# Patient Record
Sex: Male | Born: 1960
Health system: Southern US, Community
[De-identification: ages and names within clinical notes are randomized; demographics above are authoritative.]

## PROBLEM LIST (undated history)

## (undated) DIAGNOSIS — G4733 Obstructive sleep apnea (adult) (pediatric): Secondary | ICD-10-CM

## (undated) DIAGNOSIS — M545 Low back pain, unspecified: Secondary | ICD-10-CM

## (undated) DIAGNOSIS — Z7722 Contact with and (suspected) exposure to environmental tobacco smoke (acute) (chronic): Secondary | ICD-10-CM

## (undated) DIAGNOSIS — T7840XA Allergy, unspecified, initial encounter: Secondary | ICD-10-CM

## (undated) DIAGNOSIS — G629 Polyneuropathy, unspecified: Secondary | ICD-10-CM

## (undated) DIAGNOSIS — R0600 Dyspnea, unspecified: Secondary | ICD-10-CM

## (undated) DIAGNOSIS — G4730 Sleep apnea, unspecified: Secondary | ICD-10-CM

## (undated) DIAGNOSIS — M1990 Unspecified osteoarthritis, unspecified site: Secondary | ICD-10-CM

## (undated) DIAGNOSIS — I10 Essential (primary) hypertension: Secondary | ICD-10-CM

## (undated) DIAGNOSIS — G8929 Other chronic pain: Secondary | ICD-10-CM

## (undated) DIAGNOSIS — J449 Chronic obstructive pulmonary disease, unspecified: Secondary | ICD-10-CM

## (undated) DIAGNOSIS — E7800 Pure hypercholesterolemia, unspecified: Secondary | ICD-10-CM

## (undated) DIAGNOSIS — J45909 Unspecified asthma, uncomplicated: Secondary | ICD-10-CM

## (undated) DIAGNOSIS — Z9989 Dependence on other enabling machines and devices: Secondary | ICD-10-CM

## (undated) DIAGNOSIS — E119 Type 2 diabetes mellitus without complications: Secondary | ICD-10-CM

## (undated) HISTORY — DX: Allergy, unspecified, initial encounter: T78.40XA

## (undated) HISTORY — PX: LUMBAR DISC SURGERY: SHX700

## (undated) HISTORY — PX: NASAL SINUS SURGERY: SHX719

## (undated) HISTORY — PX: ANTERIOR CERVICAL DECOMP/DISCECTOMY FUSION: SHX1161

## (undated) HISTORY — DX: Sleep apnea, unspecified: G47.30

## (undated) HISTORY — PX: APPENDECTOMY: SHX54

## (undated) HISTORY — PX: BACK SURGERY: SHX140

## (undated) HISTORY — PX: INGUINAL HERNIA REPAIR: SUR1180

## (undated) HISTORY — PX: COLONOSCOPY W/ POLYPECTOMY: SHX1380

---

## 2014-05-18 DIAGNOSIS — M25512 Pain in left shoulder: Secondary | ICD-10-CM | POA: Diagnosis not present

## 2014-05-30 DIAGNOSIS — J449 Chronic obstructive pulmonary disease, unspecified: Secondary | ICD-10-CM | POA: Diagnosis not present

## 2014-05-30 DIAGNOSIS — J45909 Unspecified asthma, uncomplicated: Secondary | ICD-10-CM | POA: Diagnosis not present

## 2014-05-30 DIAGNOSIS — I10 Essential (primary) hypertension: Secondary | ICD-10-CM | POA: Diagnosis not present

## 2014-06-27 DIAGNOSIS — J45909 Unspecified asthma, uncomplicated: Secondary | ICD-10-CM | POA: Diagnosis not present

## 2014-06-28 DIAGNOSIS — J449 Chronic obstructive pulmonary disease, unspecified: Secondary | ICD-10-CM | POA: Diagnosis not present

## 2014-06-28 DIAGNOSIS — I10 Essential (primary) hypertension: Secondary | ICD-10-CM | POA: Diagnosis not present

## 2014-06-28 DIAGNOSIS — N503 Cyst of epididymis: Secondary | ICD-10-CM | POA: Diagnosis not present

## 2014-07-14 DIAGNOSIS — Z1211 Encounter for screening for malignant neoplasm of colon: Secondary | ICD-10-CM | POA: Diagnosis not present

## 2014-07-18 DIAGNOSIS — N419 Inflammatory disease of prostate, unspecified: Secondary | ICD-10-CM | POA: Diagnosis not present

## 2014-07-18 DIAGNOSIS — N503 Cyst of epididymis: Secondary | ICD-10-CM | POA: Diagnosis not present

## 2014-07-18 DIAGNOSIS — N410 Acute prostatitis: Secondary | ICD-10-CM | POA: Diagnosis not present

## 2014-07-27 DIAGNOSIS — M545 Low back pain: Secondary | ICD-10-CM | POA: Diagnosis not present

## 2014-07-27 DIAGNOSIS — I10 Essential (primary) hypertension: Secondary | ICD-10-CM | POA: Diagnosis not present

## 2014-08-01 DIAGNOSIS — J45909 Unspecified asthma, uncomplicated: Secondary | ICD-10-CM | POA: Diagnosis not present

## 2014-08-02 DIAGNOSIS — Z1211 Encounter for screening for malignant neoplasm of colon: Secondary | ICD-10-CM | POA: Diagnosis not present

## 2014-08-07 DIAGNOSIS — N481 Balanitis: Secondary | ICD-10-CM | POA: Diagnosis not present

## 2014-08-07 DIAGNOSIS — B3749 Other urogenital candidiasis: Secondary | ICD-10-CM | POA: Diagnosis not present

## 2014-08-15 DIAGNOSIS — R21 Rash and other nonspecific skin eruption: Secondary | ICD-10-CM | POA: Diagnosis not present

## 2014-08-15 DIAGNOSIS — Z4889 Encounter for other specified surgical aftercare: Secondary | ICD-10-CM | POA: Diagnosis not present

## 2014-08-17 DIAGNOSIS — M25512 Pain in left shoulder: Secondary | ICD-10-CM | POA: Diagnosis not present

## 2014-08-17 DIAGNOSIS — Z79891 Long term (current) use of opiate analgesic: Secondary | ICD-10-CM | POA: Diagnosis not present

## 2014-08-17 DIAGNOSIS — M542 Cervicalgia: Secondary | ICD-10-CM | POA: Diagnosis not present

## 2014-08-17 DIAGNOSIS — G894 Chronic pain syndrome: Secondary | ICD-10-CM | POA: Diagnosis not present

## 2014-08-18 DIAGNOSIS — R21 Rash and other nonspecific skin eruption: Secondary | ICD-10-CM | POA: Diagnosis not present

## 2014-08-18 DIAGNOSIS — B3749 Other urogenital candidiasis: Secondary | ICD-10-CM | POA: Diagnosis not present

## 2014-08-18 DIAGNOSIS — N485 Ulcer of penis: Secondary | ICD-10-CM | POA: Diagnosis not present

## 2014-08-21 DIAGNOSIS — M25551 Pain in right hip: Secondary | ICD-10-CM | POA: Diagnosis not present

## 2014-08-21 DIAGNOSIS — Z79899 Other long term (current) drug therapy: Secondary | ICD-10-CM | POA: Diagnosis not present

## 2014-08-21 DIAGNOSIS — I10 Essential (primary) hypertension: Secondary | ICD-10-CM | POA: Diagnosis not present

## 2014-08-24 DIAGNOSIS — N481 Balanitis: Secondary | ICD-10-CM | POA: Diagnosis not present

## 2014-08-24 DIAGNOSIS — M7070 Other bursitis of hip, unspecified hip: Secondary | ICD-10-CM | POA: Diagnosis not present

## 2014-09-18 DIAGNOSIS — B3749 Other urogenital candidiasis: Secondary | ICD-10-CM | POA: Diagnosis not present

## 2014-09-18 DIAGNOSIS — N481 Balanitis: Secondary | ICD-10-CM | POA: Diagnosis not present

## 2014-09-22 DIAGNOSIS — M545 Low back pain: Secondary | ICD-10-CM | POA: Diagnosis not present

## 2014-09-22 DIAGNOSIS — I10 Essential (primary) hypertension: Secondary | ICD-10-CM | POA: Diagnosis not present

## 2014-09-22 DIAGNOSIS — M7070 Other bursitis of hip, unspecified hip: Secondary | ICD-10-CM | POA: Diagnosis not present

## 2014-10-23 DIAGNOSIS — M7070 Other bursitis of hip, unspecified hip: Secondary | ICD-10-CM | POA: Diagnosis not present

## 2014-10-23 DIAGNOSIS — I10 Essential (primary) hypertension: Secondary | ICD-10-CM | POA: Diagnosis not present

## 2014-10-23 DIAGNOSIS — J449 Chronic obstructive pulmonary disease, unspecified: Secondary | ICD-10-CM | POA: Diagnosis not present

## 2014-10-26 DIAGNOSIS — N401 Enlarged prostate with lower urinary tract symptoms: Secondary | ICD-10-CM | POA: Diagnosis not present

## 2014-11-02 DIAGNOSIS — M76891 Other specified enthesopathies of right lower limb, excluding foot: Secondary | ICD-10-CM | POA: Diagnosis not present

## 2014-11-02 DIAGNOSIS — M25551 Pain in right hip: Secondary | ICD-10-CM | POA: Diagnosis not present

## 2014-11-06 DIAGNOSIS — J45909 Unspecified asthma, uncomplicated: Secondary | ICD-10-CM | POA: Diagnosis not present

## 2014-11-06 DIAGNOSIS — J45901 Unspecified asthma with (acute) exacerbation: Secondary | ICD-10-CM | POA: Diagnosis not present

## 2014-11-06 DIAGNOSIS — R0602 Shortness of breath: Secondary | ICD-10-CM | POA: Diagnosis not present

## 2014-11-06 DIAGNOSIS — R05 Cough: Secondary | ICD-10-CM | POA: Diagnosis not present

## 2014-11-06 DIAGNOSIS — R072 Precordial pain: Secondary | ICD-10-CM | POA: Diagnosis not present

## 2014-11-06 DIAGNOSIS — R0789 Other chest pain: Secondary | ICD-10-CM | POA: Diagnosis not present

## 2014-11-08 DIAGNOSIS — J45909 Unspecified asthma, uncomplicated: Secondary | ICD-10-CM | POA: Diagnosis not present

## 2014-11-08 DIAGNOSIS — J42 Unspecified chronic bronchitis: Secondary | ICD-10-CM | POA: Diagnosis not present

## 2014-11-21 DIAGNOSIS — J45901 Unspecified asthma with (acute) exacerbation: Secondary | ICD-10-CM | POA: Diagnosis not present

## 2014-11-21 DIAGNOSIS — I10 Essential (primary) hypertension: Secondary | ICD-10-CM | POA: Diagnosis not present

## 2014-12-21 DIAGNOSIS — M545 Low back pain: Secondary | ICD-10-CM | POA: Diagnosis not present

## 2015-01-02 DIAGNOSIS — H259 Unspecified age-related cataract: Secondary | ICD-10-CM | POA: Diagnosis not present

## 2015-01-02 DIAGNOSIS — H35033 Hypertensive retinopathy, bilateral: Secondary | ICD-10-CM | POA: Diagnosis not present

## 2015-01-22 DIAGNOSIS — M25569 Pain in unspecified knee: Secondary | ICD-10-CM | POA: Diagnosis not present

## 2015-02-09 DIAGNOSIS — Z23 Encounter for immunization: Secondary | ICD-10-CM | POA: Diagnosis not present

## 2015-02-20 DIAGNOSIS — M25569 Pain in unspecified knee: Secondary | ICD-10-CM | POA: Diagnosis not present

## 2015-02-20 DIAGNOSIS — M1990 Unspecified osteoarthritis, unspecified site: Secondary | ICD-10-CM | POA: Diagnosis not present

## 2015-03-21 DIAGNOSIS — M1990 Unspecified osteoarthritis, unspecified site: Secondary | ICD-10-CM | POA: Diagnosis not present

## 2015-03-21 DIAGNOSIS — M7070 Other bursitis of hip, unspecified hip: Secondary | ICD-10-CM | POA: Diagnosis not present

## 2015-03-21 DIAGNOSIS — M25569 Pain in unspecified knee: Secondary | ICD-10-CM | POA: Diagnosis not present

## 2015-04-18 DIAGNOSIS — J45901 Unspecified asthma with (acute) exacerbation: Secondary | ICD-10-CM | POA: Diagnosis not present

## 2015-04-18 DIAGNOSIS — M545 Low back pain: Secondary | ICD-10-CM | POA: Diagnosis not present

## 2015-04-18 DIAGNOSIS — M1990 Unspecified osteoarthritis, unspecified site: Secondary | ICD-10-CM | POA: Diagnosis not present

## 2015-04-18 DIAGNOSIS — M25569 Pain in unspecified knee: Secondary | ICD-10-CM | POA: Diagnosis not present

## 2015-05-15 DIAGNOSIS — J45909 Unspecified asthma, uncomplicated: Secondary | ICD-10-CM | POA: Diagnosis not present

## 2015-05-15 DIAGNOSIS — Z7951 Long term (current) use of inhaled steroids: Secondary | ICD-10-CM | POA: Diagnosis not present

## 2015-05-15 DIAGNOSIS — Z79891 Long term (current) use of opiate analgesic: Secondary | ICD-10-CM | POA: Diagnosis not present

## 2015-05-15 DIAGNOSIS — J45901 Unspecified asthma with (acute) exacerbation: Secondary | ICD-10-CM | POA: Diagnosis not present

## 2015-05-15 DIAGNOSIS — Z79899 Other long term (current) drug therapy: Secondary | ICD-10-CM | POA: Diagnosis not present

## 2015-05-15 DIAGNOSIS — I10 Essential (primary) hypertension: Secondary | ICD-10-CM | POA: Diagnosis not present

## 2015-05-15 DIAGNOSIS — E785 Hyperlipidemia, unspecified: Secondary | ICD-10-CM | POA: Diagnosis not present

## 2015-05-15 DIAGNOSIS — Z9049 Acquired absence of other specified parts of digestive tract: Secondary | ICD-10-CM | POA: Diagnosis not present

## 2015-05-15 DIAGNOSIS — Z9889 Other specified postprocedural states: Secondary | ICD-10-CM | POA: Diagnosis not present

## 2015-05-15 DIAGNOSIS — R0602 Shortness of breath: Secondary | ICD-10-CM | POA: Diagnosis not present

## 2015-05-15 DIAGNOSIS — Z7982 Long term (current) use of aspirin: Secondary | ICD-10-CM | POA: Diagnosis not present

## 2015-05-15 DIAGNOSIS — R0789 Other chest pain: Secondary | ICD-10-CM | POA: Diagnosis not present

## 2015-05-16 DIAGNOSIS — Z79891 Long term (current) use of opiate analgesic: Secondary | ICD-10-CM | POA: Diagnosis not present

## 2015-05-16 DIAGNOSIS — J45901 Unspecified asthma with (acute) exacerbation: Secondary | ICD-10-CM | POA: Diagnosis not present

## 2015-05-16 DIAGNOSIS — M1990 Unspecified osteoarthritis, unspecified site: Secondary | ICD-10-CM | POA: Diagnosis not present

## 2015-05-16 DIAGNOSIS — M7070 Other bursitis of hip, unspecified hip: Secondary | ICD-10-CM | POA: Diagnosis not present

## 2015-05-16 DIAGNOSIS — M545 Low back pain: Secondary | ICD-10-CM | POA: Diagnosis not present

## 2015-09-15 ENCOUNTER — Emergency Department (HOSPITAL_COMMUNITY): Payer: Medicare Other

## 2015-09-15 ENCOUNTER — Encounter (HOSPITAL_COMMUNITY): Payer: Self-pay

## 2015-09-15 ENCOUNTER — Emergency Department (HOSPITAL_COMMUNITY)
Admission: EM | Admit: 2015-09-15 | Discharge: 2015-09-15 | Disposition: A | Discharge status: Home / Self Care | Payer: Medicare Other | Attending: Emergency Medicine | Admitting: Emergency Medicine

## 2015-09-15 DIAGNOSIS — I10 Essential (primary) hypertension: Secondary | ICD-10-CM | POA: Diagnosis not present

## 2015-09-15 DIAGNOSIS — J45901 Unspecified asthma with (acute) exacerbation: Secondary | ICD-10-CM | POA: Diagnosis not present

## 2015-09-15 DIAGNOSIS — R0602 Shortness of breath: Secondary | ICD-10-CM | POA: Diagnosis not present

## 2015-09-15 DIAGNOSIS — Z79899 Other long term (current) drug therapy: Secondary | ICD-10-CM | POA: Insufficient documentation

## 2015-09-15 DIAGNOSIS — J441 Chronic obstructive pulmonary disease with (acute) exacerbation: Secondary | ICD-10-CM | POA: Diagnosis not present

## 2015-09-15 DIAGNOSIS — R062 Wheezing: Secondary | ICD-10-CM | POA: Diagnosis present

## 2015-09-15 HISTORY — DX: Chronic obstructive pulmonary disease, unspecified: J44.9

## 2015-09-15 HISTORY — DX: Essential (primary) hypertension: I10

## 2015-09-15 HISTORY — DX: Unspecified asthma, uncomplicated: J45.909

## 2015-09-15 LAB — BASIC METABOLIC PANEL
Anion gap: 10 (ref 5–15)
BUN: 9 mg/dL (ref 6–20)
CALCIUM: 9.3 mg/dL (ref 8.9–10.3)
CO2: 27 mmol/L (ref 22–32)
Chloride: 104 mmol/L (ref 101–111)
Creatinine, Ser: 1.28 mg/dL — ABNORMAL HIGH (ref 0.61–1.24)
GFR calc Af Amer: >60 mL/min (ref >60)
GLUCOSE: 116 mg/dL — AB (ref 65–99)
POTASSIUM: 3.6 mmol/L (ref 3.5–5.1)
SODIUM: 141 mmol/L (ref 135–145)

## 2015-09-15 LAB — CBC WITH DIFFERENTIAL/PLATELET
BASOS ABS: 0 10*3/uL (ref 0.0–0.1)
BASOS PCT: 1 %
EOS ABS: 0.3 10*3/uL (ref 0.0–0.7)
EOS PCT: 10 %
HCT: 41.9 % (ref 39.0–52.0)
Hemoglobin: 14.2 g/dL (ref 13.0–17.0)
LYMPHS PCT: 41 %
Lymphs Abs: 1.1 10*3/uL (ref 0.7–4.0)
MCH: 29 pg (ref 26.0–34.0)
MCHC: 33.9 g/dL (ref 30.0–36.0)
MCV: 85.5 fL (ref 78.0–100.0)
MONO ABS: 0.2 10*3/uL (ref 0.1–1.0)
Monocytes Relative: 8 %
Neutro Abs: 1.1 10*3/uL — ABNORMAL LOW (ref 1.7–7.7)
Neutrophils Relative %: 40 %
PLATELETS: 211 10*3/uL (ref 150–400)
RBC: 4.9 MIL/uL (ref 4.22–5.81)
RDW: 13.8 % (ref 11.5–15.5)
WBC: 2.8 10*3/uL — AB (ref 4.0–10.5)

## 2015-09-15 MED ORDER — DEXAMETHASONE SODIUM PHOSPHATE 10 MG/ML IJ SOLN
10.0000 mg | Freq: Once | INTRAMUSCULAR | Status: AC
  Administered 2015-09-15: 10 mg via INTRAMUSCULAR
  Filled 2015-09-15: qty 1

## 2015-09-15 MED ORDER — ALBUTEROL SULFATE (2.5 MG/3ML) 0.083% IN NEBU
INHALATION_SOLUTION | RESPIRATORY_TRACT | Status: AC
  Administered 2015-09-15: 10:00:00 via RESPIRATORY_TRACT
  Filled 2015-09-15: qty 6

## 2015-09-15 MED ORDER — ALBUTEROL SULFATE HFA 108 (90 BASE) MCG/ACT IN AERS: 1.0000 | INHALATION_SPRAY | Freq: Four times a day (QID) | RESPIRATORY_TRACT | Status: DC | PRN

## 2015-09-15 MED ORDER — ALBUTEROL SULFATE (2.5 MG/3ML) 0.083% IN NEBU
5.0000 mg | INHALATION_SOLUTION | Freq: Once | RESPIRATORY_TRACT | Status: AC
  Administered 2015-09-15: 5 mg via RESPIRATORY_TRACT

## 2015-09-15 MED ORDER — BUDESONIDE-FORMOTEROL FUMARATE 80-4.5 MCG/ACT IN AERO: 2.0000 | INHALATION_SPRAY | Freq: Two times a day (BID) | RESPIRATORY_TRACT | Status: DC

## 2015-09-15 MED ORDER — IPRATROPIUM-ALBUTEROL 0.5-2.5 (3) MG/3ML IN SOLN
3.0000 mL | RESPIRATORY_TRACT | Status: DC
  Administered 2015-09-15: 3 mL via RESPIRATORY_TRACT
  Filled 2015-09-15: qty 3

## 2015-09-15 MED ORDER — PREDNISONE 20 MG PO TABS: 40.0000 mg | ORAL_TABLET | Freq: Every day | ORAL | Status: DC

## 2015-09-15 NOTE — Discharge Instructions (Signed)
Is important for you to take your medications as prescribed. Please follow-up with your doctor or the [facility] in order to establish care. Return to ED for any new or worsening symptoms as we discussed.  Asthma, Acute Bronchospasm Acute bronchospasm caused by asthma is also referred to as an asthma attack. Bronchospasm means your air passages become narrowed. The narrowing is caused by inflammation and tightening of the muscles in the air tubes (bronchi) in your lungs. This can make it hard to breathe or cause you to wheeze and cough. CAUSES Possible triggers are:  Animal dander from the skin, hair, or feathers of animals.  Dust mites contained in house dust.  Cockroaches.  Pollen from trees or grass.  Mold.  Cigarette or tobacco smoke.  Air pollutants such as dust, household cleaners, hair sprays, aerosol sprays, paint fumes, strong chemicals, or strong odors.  Cold air or weather changes. Cold air may trigger inflammation. Winds increase molds and pollens in the air.  Strong emotions such as crying or laughing hard.  Stress.  Certain medicines such as aspirin or beta-blockers.  Sulfites in foods and drinks, such as dried fruits and wine.  Infections or inflammatory conditions, such as a flu, cold, or inflammation of the nasal membranes (rhinitis).  Gastroesophageal reflux disease (GERD). GERD is a condition where stomach acid backs up into your esophagus.  Exercise or strenuous activity. SIGNS AND SYMPTOMS   Wheezing.  Excessive coughing, particularly at night.  Chest tightness.  Shortness of breath. DIAGNOSIS  Your health care provider will ask you about your medical history and perform a physical exam. A chest X-ray or blood testing may be performed to look for other causes of your symptoms or other conditions that may have triggered your asthma attack. TREATMENT  Treatment is aimed at reducing inflammation and opening up the airways in  your lungs. Most asthma attacks are treated with inhaled medicines. These include quick relief or rescue medicines (such as bronchodilators) and controller medicines (such as inhaled corticosteroids). These medicines are sometimes given through an inhaler or a nebulizer. Systemic steroid medicine taken by mouth or given through an IV tube also can be used to reduce the inflammation when an attack is moderate or severe. Antibiotic medicines are only used if a bacterial infection is present.  HOME CARE INSTRUCTIONS   Rest.  Drink plenty of liquids. This helps the mucus to remain thin and be easily coughed up. Only use caffeine in moderation and do not use alcohol until you have recovered from your illness.  Do not smoke. Avoid being exposed to secondhand smoke.  You play a critical role in keeping yourself in good health. Avoid exposure to things that cause you to wheeze or to have breathing problems.  Keep your medicines up-to-date and available. Carefully follow your health care provider's treatment plan.  Take your medicine exactly as prescribed.  When pollen or pollution is bad, keep windows closed and use an air conditioner or go to places with air conditioning.  Asthma requires careful medical care. See your health care provider for a follow-up as advised. If you are more than [redacted] weeks pregnant and you were prescribed any new medicines, let your obstetrician know about the visit and how you are doing. Follow up with your health care provider as directed.  After you have recovered from your asthma attack, make an appointment with your outpatient doctor to talk about ways to reduce the likelihood of future attacks. If you do not  have a doctor who manages your asthma, make an appointment with a primary care doctor to discuss your asthma. SEEK IMMEDIATE MEDICAL CARE IF:   You are getting worse.  You have trouble breathing. If severe, call your local emergency services (911 in the  [place]  You develop chest pain or discomfort.  You are vomiting.  You are not able to keep fluids down.  You are coughing up yellow, green, brown, or bloody sputum.  You have a fever and your symptoms suddenly get worse.  You have trouble swallowing. MAKE SURE YOU:   Understand these instructions.  Will watch your condition.  Will get help right away if you are not doing well or get worse.   This information is not intended to replace advice given to you by your health care provider. Make sure you discuss any questions you have with your health care provider.   Document Released: 07/30/2006 Document Revised: 04/19/2013 Document Reviewed: 10/20/2012 Elsevier Interactive Patient Education [employer].

## 2015-09-15 NOTE — ED Notes (Signed)
Patient d/c'd from monitor, continuous pulse oximetry and blood pressure cuff; patient getting dressed to be discharged home 

## 2015-09-15 NOTE — ED Notes (Signed)
Patient here with complaint of wheezing and congestion, using inhaler with minimal relief. No acute distress

## 2015-09-15 NOTE — ED Provider Notes (Signed)
CSN: [id number]     Arrival date & time 09/15/15  0706 History   First MD Initiated Contact with Patient 09/15/15 0750     Chief Complaint  Patient presents with  . asthma flare-up      (Consider location/radiation/quality/duration/timing/severity/associated sxs/prior Treatment) HPI Christian [name] is a 55 y.o. male with a history of asthma, COPD, hypertension, here for evaluation of asthma exacerbation. Patient reports he has had gradually worsening wheezing, chest congestion over the past one month. Reports has been using his inhaler without relief. States he typically takes oral prednisoneAnd Symbicort, but ran out of this medication approximately one month ago. Moved here 5 months ago and has not found a primary care yet. Reports associated white/yellow productive cough. No other fevers, chills, chest pain or shortness of breath, leg swelling.  Past Medical History  Diagnosis Date  . Asthma   . Hypertension   . COPD (chronic obstructive pulmonary disease) [facility])    Past Surgical History  Procedure Laterality Date  . Back surgery     No family history on file. Social History  Substance Use Topics  . Smoking status: Never Smoker   . Smokeless tobacco: None  . Alcohol Use: None    Review of Systems A 10 point review of systems was completed and was negative except for pertinent positives and negatives as mentioned in the history of present illness     Allergies  Phenergan  Home Medications   Prior to Admission medications   Medication Sig Start Date End Date Taking? Authorizing Provider  albuterol (PROVENTIL HFA;VENTOLIN HFA) 108 (90 Base) MCG/ACT inhaler Inhale 1-2 puffs into the lungs every 6 (six) hours as needed for wheezing or shortness of breath. 09/15/15   Comer Locket, PA-C  budesonide-formoterol (SYMBICORT) 80-4.5 MCG/ACT inhaler Inhale 2 puffs into the lungs 2 (two) times daily. 09/15/15   Comer Locket, PA-C  predniSONE (DELTASONE) 20 MG tablet Take 2 tablets  (40 mg total) by mouth daily. 09/15/15   Decklin Weddington, PA-C   BP 150/98 mmHg  Pulse 71  Temp(Src) 98.3 F (36.8 C) (Oral)  Resp 18  SpO2 94% Physical Exam  Constitutional: He is oriented to person, place, and time. He appears well-developed and well-nourished.  HENT:  Head: Normocephalic and atraumatic.  Mouth/Throat: Oropharynx is clear and moist.  Eyes: Conjunctivae are normal. Pupils are equal, round, and reactive to light. Right eye exhibits no discharge. Left eye exhibits no discharge. No scleral icterus.  Neck: Neck supple.  Cardiovascular: Normal rate, regular rhythm and normal heart sounds.   Pulmonary/Chest: Effort normal and breath sounds normal. No respiratory distress. He has no rales.  Very mild expiratory wheezing. No other adventitious lung sounds. Oxygen saturations 98% on room air.  Abdominal: Soft. There is no tenderness.  Musculoskeletal: He exhibits no tenderness.  Neurological: He is alert and oriented to person, place, and time.  Cranial Nerves II-XII grossly intact  Skin: Skin is warm and dry. No rash noted.  Psychiatric: He has a normal mood and affect.  Nursing note and vitals reviewed.   ED Course  Procedures (including critical care time) Labs Review Labs Reviewed  BASIC METABOLIC PANEL - Abnormal; Notable for the following:    Glucose, Bld 116 (*)    Creatinine, Ser 1.28 (*)    All other components within normal limits  CBC WITH DIFFERENTIAL/PLATELET - Abnormal; Notable for the following:    WBC 2.8 (*)    Neutro Abs 1.1 (*)    All other components within  normal limits    Imaging Review Dg Chest 2 View  09/15/2015  CLINICAL DATA:  Pt states he has a hx of asthma, COPD, HTN. For the past month he has become increasingly SOB and wheezing. Pt states his medication is not helping him breathe like it used to. EXAM: CHEST  2 VIEW COMPARISON:  None. FINDINGS: The heart size and mediastinal contours are within normal limits. Both lungs are clear. No  pleural effusion or pneumothorax. The visualized skeletal structures are unremarkable. IMPRESSION: No active cardiopulmonary disease. Electronically Signed   By: Lajean Manes M.D.   On: 09/15/2015 09:27   I have personally reviewed and evaluated these images and lab results as part of my medical decision-making.   EKG Interpretation   Date/Time:  Saturday Sep 15 2015 09:01:07 EDT Ventricular Rate:  73 PR Interval:  207 QRS Duration: 83 QT Interval:  406 QTC Calculation: 447 R Axis:   62 Text Interpretation:  Sinus rhythm T wave abnormality Abnormal ekg  Reconfirmed by Carmin Muskrat  MD [phone]) on 09/15/2015 9:13:59 AM     Meds given in ED:  Medications  ipratropium-albuterol (DUONEB) 0.5-2.5 (3) MG/3ML nebulizer solution 3 mL (3 mLs Nebulization Given 09/15/15 0824)  albuterol (PROVENTIL) (2.5 MG/3ML) 0.083% nebulizer solution 5 mg (5 mg Nebulization Given 09/15/15 0718)  albuterol (PROVENTIL) (2.5 MG/3ML) 0.083% nebulizer solution ( Nebulization Given by Other 09/15/15 0943)  dexamethasone (DECADRON) injection 10 mg (10 mg Intramuscular Given 09/15/15 [id number])    New Prescriptions   ALBUTEROL (PROVENTIL HFA;VENTOLIN HFA) 108 (90 BASE) MCG/ACT INHALER    Inhale 1-2 puffs into the lungs every 6 (six) hours as needed for wheezing or shortness of breath.   BUDESONIDE-FORMOTEROL (SYMBICORT) 80-4.5 MCG/ACT INHALER    Inhale 2 puffs into the lungs 2 (two) times daily.   PREDNISONE (DELTASONE) 20 MG TABLET    Take 2 tablets (40 mg total) by mouth daily.   Filed Vitals:   09/15/15 0830 09/15/15 0900 09/15/15 1000 09/15/15 1026  BP: 156/96 165/99 150/98 150/98  Pulse: 77 71 76 71  Temp:      TempSrc:      Resp: 20  15 18   SpO2: 100% 95% 92% 94%    MDM  Christian [name] is a 55 y.o. male with a reported history of asthma here for evaluation of acute asthma exacerbation. Denies any chest pain or overt shortness of breath. Afebrile, hemodynamically stable, 100% on room air. EKG is not concerning.  Cardio pulmonary exam is reassuring. Chest x-ray unremarkable. Patient given 10 mg Decadron and emergency department, states feels much better after one breathing treatment. We'll discharge with prescription for albuterol inhaler and prednisone. Refill prescription for Symbicort. [facility] to [facility]CP care. Discussed strict return precautions. Overall, appears well, nontoxic, hematemesis stable and appropriate for discharge. Final diagnoses:  Asthma exacerbation        Comer Locket, PA-C 09/15/15 [place], MD 09/16/15 1045

## 2016-01-14 ENCOUNTER — Ambulatory Visit: Payer: Medicare Other | Attending: Internal Medicine | Admitting: Internal Medicine

## 2016-01-14 ENCOUNTER — Encounter: Payer: Self-pay | Admitting: Internal Medicine

## 2016-01-14 VITALS — BP 155/89 | HR 77 | Temp 98.7°F | Resp 16 | Wt 183.2 lb

## 2016-01-14 DIAGNOSIS — M25512 Pain in left shoulder: Secondary | ICD-10-CM | POA: Diagnosis not present

## 2016-01-14 DIAGNOSIS — R748 Abnormal levels of other serum enzymes: Secondary | ICD-10-CM

## 2016-01-14 DIAGNOSIS — J441 Chronic obstructive pulmonary disease with (acute) exacerbation: Secondary | ICD-10-CM | POA: Diagnosis not present

## 2016-01-14 DIAGNOSIS — I10 Essential (primary) hypertension: Secondary | ICD-10-CM | POA: Insufficient documentation

## 2016-01-14 DIAGNOSIS — G894 Chronic pain syndrome: Secondary | ICD-10-CM

## 2016-01-14 DIAGNOSIS — R7989 Other specified abnormal findings of blood chemistry: Secondary | ICD-10-CM

## 2016-01-14 DIAGNOSIS — Z7952 Long term (current) use of systemic steroids: Secondary | ICD-10-CM | POA: Diagnosis not present

## 2016-01-14 DIAGNOSIS — J449 Chronic obstructive pulmonary disease, unspecified: Secondary | ICD-10-CM | POA: Diagnosis not present

## 2016-01-14 DIAGNOSIS — M4802 Spinal stenosis, cervical region: Secondary | ICD-10-CM | POA: Insufficient documentation

## 2016-01-14 DIAGNOSIS — IMO0002 Reserved for concepts with insufficient information to code with codable children: Secondary | ICD-10-CM

## 2016-01-14 DIAGNOSIS — Z9889 Other specified postprocedural states: Secondary | ICD-10-CM | POA: Diagnosis not present

## 2016-01-14 DIAGNOSIS — R739 Hyperglycemia, unspecified: Secondary | ICD-10-CM

## 2016-01-14 DIAGNOSIS — Z131 Encounter for screening for diabetes mellitus: Secondary | ICD-10-CM | POA: Diagnosis not present

## 2016-01-14 DIAGNOSIS — M25562 Pain in left knee: Secondary | ICD-10-CM | POA: Diagnosis not present

## 2016-01-14 DIAGNOSIS — M25511 Pain in right shoulder: Secondary | ICD-10-CM | POA: Diagnosis not present

## 2016-01-14 DIAGNOSIS — Z888 Allergy status to other drugs, medicaments and biological substances status: Secondary | ICD-10-CM | POA: Insufficient documentation

## 2016-01-14 DIAGNOSIS — H6123 Impacted cerumen, bilateral: Secondary | ICD-10-CM

## 2016-01-14 LAB — POCT GLYCOSYLATED HEMOGLOBIN (HGB A1C): Hemoglobin A1C: 6.5

## 2016-01-14 MED ORDER — BUDESONIDE-FORMOTEROL FUMARATE 160-4.5 MCG/ACT IN AERO: 2.0000 | INHALATION_SPRAY | Freq: Two times a day (BID) | RESPIRATORY_TRACT | 3 refills | Status: DC

## 2016-01-14 MED ORDER — ALBUTEROL SULFATE HFA 108 (90 BASE) MCG/ACT IN AERS: 1.0000 | INHALATION_SPRAY | Freq: Four times a day (QID) | RESPIRATORY_TRACT | 11 refills | Status: DC | PRN

## 2016-01-14 MED ORDER — GABAPENTIN 300 MG PO CAPS
300.0000 mg | ORAL_CAPSULE | Freq: Four times a day (QID) | ORAL | 1 refills | Status: DC
Start: 2016-01-14 — End: 2016-04-02

## 2016-01-14 MED ORDER — SIMVASTATIN 40 MG PO TABS: 40.0000 mg | ORAL_TABLET | Freq: Every day | ORAL | 2 refills | Status: DC

## 2016-01-14 MED ORDER — CARISOPRODOL 350 MG PO TABS: 350.0000 mg | ORAL_TABLET | Freq: Four times a day (QID) | ORAL | 0 refills | Status: DC | PRN

## 2016-01-14 MED ORDER — ASPIRIN EC 81 MG PO TBEC: 81.0000 mg | DELAYED_RELEASE_TABLET | Freq: Every day | ORAL | 2 refills | Status: DC

## 2016-01-14 MED ORDER — HYDROCHLOROTHIAZIDE 12.5 MG PO TABS: 12.5000 mg | ORAL_TABLET | Freq: Every day | ORAL | 2 refills | Status: DC

## 2016-01-14 MED ORDER — ALBUTEROL SULFATE HFA 108 (90 BASE) MCG/ACT IN AERS: 1.0000 | INHALATION_SPRAY | Freq: Four times a day (QID) | RESPIRATORY_TRACT | 0 refills | Status: DC | PRN

## 2016-01-14 MED ORDER — AMLODIPINE BESYLATE 10 MG PO TABS: 10.0000 mg | ORAL_TABLET | Freq: Every day | ORAL | 2 refills | Status: DC

## 2016-01-14 MED ORDER — CARBAMIDE PEROXIDE 6.5 % OT SOLN: 5.0000 [drp] | Freq: Two times a day (BID) | OTIC | 0 refills | Status: DC

## 2016-01-14 MED ORDER — LOSARTAN POTASSIUM 50 MG PO TABS: 50.0000 mg | ORAL_TABLET | Freq: Every day | ORAL | 2 refills | Status: DC

## 2016-01-14 MED FILL — VENTOLIN HFA 90 MCG INHALER: 108 (90 BAS | 30 days supply | Qty: 18 | Fill #0

## 2016-01-14 NOTE — Progress Notes (Addendum)
Christian [name], is a 55 y.o. male  [id number]  [id number]  DOB - 26-May-1960  CC:  Chief Complaint  Patient presents with  . New Patient (Initial Visit)       HPI: Christian [name] is a 55 y.o. male here today to establish medical care, moved here from [place], [place] w/ his wife, has not been able to see PCP til now.  He has significant pmhx of htn, chronic pain syndrome (cervical and lumbar), sp 3 cervical fusions and 2 lumbar surgeries, hx of both asthma and copd pt states, with pulmonary function status in past 60% function per pt.  He has been on chronic prednisone 10mg  qday for the last year by his Pulmonologist (out of town), ran out about 2 wks ago.  He noticed dry, nonproductive cough last night and this am, but no f/c/wheezing. Uses his symbicort and albuterol (which he states he uses daily).  Per pt, never smoked, but around a lot of 2nd hand tobacco in past. Denies etoh.  For his htn, he is normally on 3 meds, hctz 12.5, amlodipine 10 and cozaar 50 qday, but ran out of cozaar about 2 months ago.  C/o of bilateral shoulder pains since his neck surgery.  He talked to his surgeon prior to the move, and surgeon at that time recd f/u w/ shoulder surgeon for further eval.  Pt also states his shoulder pain have been worse last 57months, difficult to sleep at night.  He also c/o of swollen left knee, gives out on him sometimes, pain worsening last 35months. Ran out of all his pain meds last 2-3 months as well.  Patient has No headache, No chest pain, No abdominal pain - No Nausea, No Cough - SOB.  Here w/ his young grandson, about 10yo.   Review of Systems: Per HPI, o/w all systems reviewed and negative.  Allergies  Allergen Reactions  . Phenergan [Promethazine Hcl] Itching   Past Medical History:  Diagnosis Date  . Asthma   . COPD (chronic obstructive pulmonary disease) ([place])   . Hypertension    No current outpatient prescriptions on file prior to visit.   No current  facility-administered medications on file prior to visit.    No family history on file. Social History   Social History  . Marital status: Married    Spouse name: N/A  . Number of children: N/A  . Years of education: N/A   Occupational History  . Not on file.   Social History Main Topics  . Smoking status: Never Smoker  . Smokeless tobacco: Not on file  . Alcohol use Not on file  . Drug use: Unknown  . Sexual activity: Not on file   Other Topics Concern  . Not on file   Social History Narrative  . No narrative on file    Objective:   Vitals:   01/14/16 1415  BP: (!) 155/89  Pulse: 77  Resp: 16  Temp: 98.7 F (37.1 C)    Filed Weights   01/14/16 1415  Weight: 183 lb 3.2 oz (83.1 kg)    BP Readings from Last 3 Encounters:  01/14/16 (!) 155/89  09/15/15 150/98    Physical Exam: Constitutional: Patient appears well-developed and well-nourished. No distress. AAOx3, pleasant HENT: Normocephalic, atraumatic, External right and left ear normal. Oropharynx is clear and moist.  bilat ear canals w/ ceruminosis. Eyes: Conjunctivae and EOM are normal. PERRL, no scleral icterus. Neck: Normal ROM. Neck supple. No JVD. No tracheal deviation. No thyromegaly.  CVS: RRR, S1/S2 +, no murmurs, no gallops, no carotid bruit.  Pulmonary: Effort and breath sounds normal, no stridor, rhonchi, wheezes, rales.  Abdominal: Soft. BS +, no distension, tenderness, rebound or guarding.  Musculoskeletal: Normal range of motion. Mild ttp bilateral anterior rotator cuff on palpation, rom intact; left knee edema > right knee, mild ttp diffusely, not warm to touch. LE: bilat/ no c/c/e, pulses 2+ bilateral. Neuro: Alert. muscle tone coordination wnl. No cranial nerve deficit grossly. Skin: Skin is warm and dry. No rash noted. Not diaphoretic. No erythema. No pallor. Psychiatric: Normal mood and affect. Behavior, judgment, thought content normal.  Lab Results  Component Value Date   WBC 2.8  (L) 09/15/2015   HGB 14.2 09/15/2015   HCT 41.9 09/15/2015   MCV 85.5 09/15/2015   PLT 211 09/15/2015   Lab Results  Component Value Date   CREATININE 1.28 (H) 09/15/2015   BUN 9 09/15/2015   NA 141 09/15/2015   K 3.6 09/15/2015   CL 104 09/15/2015   CO2 27 09/15/2015    Lab Results  Component Value Date   HGBA1C 6.5 01/14/2016   Lipid Panel  No results found for: CHOL, TRIG, HDL, CHOLHDL, VLDL, LDLCALC     No flowsheet data found.  Assessment and plan:   1. Copd/asthma per pt, uncontrolled - long term prednisone use, x 1 year at least, off for last 2 wks at least when ran out of rx. - try to wean off steroids - increase symbicort from 80 to 160/4.6 inhaler to see if better controll - albuterol mdi prn only, currently using daily. - Ambulatory referral to Pulmonology   2. Bilateral shoulder pain Hx of cervical stenosis, sp surgery, ttp bilat shoulders. - DG Shoulder Left; Future - DG Shoulder Right; Future - may need ortho eval based on findings.  3. Left knee pain slightly swollen vs right knee, suspect OA, no signs of septic joint/no f/c. - DG Knee Complete 4 Views Left; Future   4. Chronic pain syndrome - Ambulatory referral to Pain Clinic  5. Creatinine elevation H/o on on prior labs, - BASIC METABOLIC PANEL WITH GFR  6. Hyperglycemia, dm screening. - HgB A1c  7. Ceruminosis, bilateral Debrox gtt, irrigation next time if still there.  8. Pt to sign release of info for outside records, does not recall when if had pneumococcal or tday vaccine. Return in about 2 months (around 03/15/2016) for htn.  The patient was given clear instructions to go to ER or return to medical center if symptoms don't improve, worsen or new problems develop. The patient verbalized understanding. The patient was told to call to get lab results if they haven't heard anything in the next week.    This note has been created with Human resources officer. Any transcriptional errors are unintentional.   Maren Reamer, MD, [place], [place]   01/14/2016, 3:49 PM

## 2016-01-14 NOTE — Progress Notes (Signed)
Pt is in today for a new pt visit Pt is in today to establish care  Pt complains of chronic back pain Pt complains of knee and bi-lateral shoulder pain  Pt states he is taking his medications without any difficulties

## 2016-01-14 NOTE — Patient Instructions (Addendum)
Please make sure pt signs release of info sheet so we can send to his out of town PCP for records. Thanks.    Hypertension Hypertension is another name for high blood pressure. High blood pressure forces your heart to work harder to pump blood. A blood pressure reading has two numbers, which includes a higher number over a lower number (example: 110/72). HOME CARE   Have your blood pressure rechecked by your doctor.  Only take medicine as told by your doctor. Follow the directions carefully. The medicine does not work as well if you skip doses. Skipping doses also puts you at risk for problems.  Do not smoke.  Monitor your blood pressure at home as told by your doctor. GET HELP IF:  You think you are having a reaction to the medicine you are taking.  You have repeat headaches or feel dizzy.  You have puffiness (swelling) in your ankles.  You have trouble with your vision. GET HELP RIGHT AWAY IF:   You get a very bad headache and are confused.  You feel weak, numb, or faint.  You get chest or belly (abdominal) pain.  You throw up (vomit).  You cannot breathe very well. MAKE SURE YOU:   Understand these instructions.  Will watch your condition.  Will get help right away if you are not doing well or get worse.   This information is not intended to replace advice given to you by your health care provider. Make sure you discuss any questions you have with your health care provider.   Document Released: 10/01/2007 Document Revised: 04/19/2013 Document Reviewed: 02/04/2013 Elsevier Interactive Patient Education 2016 [place] DASH stands for "Dietary Approaches to Stop Hypertension." The DASH eating plan is a healthy eating plan that has been shown to reduce high blood pressure (hypertension). Additional health benefits may include reducing the risk of type 2 diabetes mellitus, heart disease, and stroke. The DASH eating plan may also help with  weight loss. WHAT DO I NEED TO KNOW ABOUT THE DASH EATING PLAN? For the DASH eating plan, you will follow these general guidelines:  Choose foods with a percent daily value for sodium of less than 5% (as listed on the food label).  Use salt-free seasonings or herbs instead of table salt or sea salt.  Check with your health care provider or pharmacist before using salt substitutes.  Eat lower-sodium products, often labeled as "lower sodium" or "no salt added."  Eat fresh foods.  Eat more vegetables, fruits, and low-fat dairy products.  Choose whole grains. Look for the word "whole" as the first word in the ingredient list.  Choose fish and skinless chicken or [place] more often than red meat. Limit fish, poultry, and meat to 6 oz (170 g) each day.  Limit sweets, desserts, sugars, and sugary drinks.  Choose heart-healthy fats.  Limit cheese to 1 oz (28 g) per day.  Eat more home-cooked food and less restaurant, buffet, and fast food.  Limit fried foods.  Cook foods using methods other than frying.  Limit canned vegetables. If you do use them, rinse them well to decrease the sodium.  When eating at a restaurant, ask that your food be prepared with less salt, or no salt if possible. WHAT FOODS CAN I EAT? Seek help from a dietitian for individual calorie needs. Grains Whole grain or whole wheat bread. Brown rice. Whole grain or whole wheat pasta. Quinoa, bulgur, and whole grain cereals. Low-sodium cereals. Corn  or whole wheat flour tortillas. Whole grain cornbread. Whole grain crackers. Low-sodium crackers. Vegetables Fresh or frozen vegetables (raw, steamed, roasted, or grilled). Low-sodium or reduced-sodium tomato and vegetable juices. Low-sodium or reduced-sodium tomato sauce and paste. Low-sodium or reduced-sodium canned vegetables.  Fruits All fresh, canned (in natural juice), or frozen fruits. Meat and Other Protein Products Ground beef (85% or leaner), grass-fed beef, or  beef trimmed of fat. Skinless chicken or [place]. Ground chicken or [place]. Pork trimmed of fat. All fish and seafood. Eggs. Dried beans, peas, or lentils. Unsalted nuts and seeds. Unsalted canned beans. Dairy Low-fat dairy products, such as skim or 1% milk, 2% or reduced-fat cheeses, low-fat ricotta or cottage cheese, or plain low-fat yogurt. Low-sodium or reduced-sodium cheeses. Fats and Oils Tub margarines without trans fats. Light or reduced-fat mayonnaise and salad dressings (reduced sodium). Avocado. Safflower, olive, or canola oils. Natural peanut or almond butter. Other Unsalted popcorn and pretzels. The items listed above may not be a complete list of recommended foods or beverages. Contact your dietitian for more options. WHAT FOODS ARE NOT RECOMMENDED? Grains White bread. White pasta. White rice. Refined cornbread. Bagels and croissants. Crackers that contain trans fat. Vegetables Creamed or fried vegetables. Vegetables in a cheese sauce. Regular canned vegetables. Regular canned tomato sauce and paste. Regular tomato and vegetable juices. Fruits Dried fruits. Canned fruit in light or heavy syrup. Fruit juice. Meat and Other Protein Products Fatty cuts of meat. Ribs, chicken wings, bacon, sausage, bologna, salami, chitterlings, fatback, hot dogs, bratwurst, and packaged luncheon meats. Salted nuts and seeds. Canned beans with salt. Dairy Whole or 2% milk, cream, half-and-half, and cream cheese. Whole-fat or sweetened yogurt. Full-fat cheeses or blue cheese. Nondairy creamers and whipped toppings. Processed cheese, cheese spreads, or cheese curds. Condiments Onion and garlic salt, seasoned salt, table salt, and sea salt. Canned and packaged gravies. Worcestershire sauce. Tartar sauce. Barbecue sauce. Teriyaki sauce. Soy sauce, including reduced sodium. Steak sauce. Fish sauce. Oyster sauce. Cocktail sauce. Horseradish. Ketchup and mustard. Meat flavorings and tenderizers. Bouillon cubes.  Hot sauce. Tabasco sauce. Marinades. Taco seasonings. Relishes. Fats and Oils Butter, stick margarine, lard, shortening, ghee, and bacon fat. Coconut, palm kernel, or palm oils. Regular salad dressings. Other Pickles and olives. Salted popcorn and pretzels. The items listed above may not be a complete list of foods and beverages to avoid. Contact your dietitian for more information. WHERE CAN I FIND MORE INFORMATION? National Heart, Lung, and Blood Institute: travelstabloid.com   This information is not intended to replace advice given to you by your health care provider. Make sure you discuss any questions you have with your health care provider.   Document Released: 04/03/2011 Document Revised: 05/05/2014 Document Reviewed: 02/16/2013 Elsevier Interactive Patient Education 2016 [place] Maintenance, Male A healthy lifestyle and preventative care can promote health and wellness.  Maintain regular health, dental, and eye exams.  Eat a healthy diet. Foods like vegetables, fruits, whole grains, low-fat dairy products, and lean protein foods contain the nutrients you need and are low in calories. Decrease your intake of foods high in solid fats, added sugars, and salt. Get information about a proper diet from your health care provider, if necessary.  Regular physical exercise is one of the most important things you can do for your health. Most adults should get at least 150 minutes of moderate-intensity exercise (any activity that increases your heart rate and causes you to sweat) each week. In addition, most adults need muscle-strengthening exercises  on 2 or more days a week.   Maintain a healthy weight. The body mass index (BMI) is a screening tool to identify possible weight problems. It provides an estimate of body fat based on height and weight. Your health care provider can find your BMI and can help you achieve or maintain a healthy  weight. For males 20 years and older:  A BMI below 18.5 is considered underweight.  A BMI of 18.5 to 24.9 is normal.  A BMI of 25 to 29.9 is considered overweight.  A BMI of 30 and above is considered obese.  Maintain normal blood lipids and cholesterol by exercising and minimizing your intake of saturated fat. Eat a balanced diet with plenty of fruits and vegetables. Blood tests for lipids and cholesterol should begin at age 79 and be repeated every 5 years. If your lipid or cholesterol levels are high, you are over age 64, or you are at high risk for heart disease, you may need your cholesterol levels checked more frequently.Ongoing high lipid and cholesterol levels should be treated with medicines if diet and exercise are not working.  If you smoke, find out from your health care provider how to quit. If you do not use tobacco, do not start.  Lung cancer screening is recommended for adults aged 90-80 years who are at high risk for developing lung cancer because of a history of smoking. A yearly low-dose CT scan of the lungs is recommended for people who have at least a 30-pack-year history of smoking and are current smokers or have quit within the past 15 years. A pack year of smoking is smoking an average of 1 pack of cigarettes a day for 1 year (for example, a 30-pack-year history of smoking could mean smoking 1 pack a day for 30 years or 2 packs a day for 15 years). Yearly screening should continue until the smoker has stopped smoking for at least 15 years. Yearly screening should be stopped for people who develop a health problem that would prevent them from having lung cancer treatment.  If you choose to drink alcohol, do not have more than 2 drinks per day. One drink is considered to be 12 oz (360 mL) of beer, 5 oz (150 mL) of wine, or 1.5 oz (45 mL) of liquor.  Avoid the use of street drugs. Do not share needles with anyone. Ask for help if you need support or instructions about stopping  the use of drugs.  High blood pressure causes heart disease and increases the risk of stroke. High blood pressure is more likely to develop in:  People who have blood pressure in the end of the normal range (100-139/85-89 mm Hg).  People who are overweight or obese.  People who are African American.  If you are 69-32 years of age, have your blood pressure checked every 3-5 years. If you are 57 years of age or older, have your blood pressure checked every year. You should have your blood pressure measured twice--once when you are at a hospital or clinic, and once when you are not at a hospital or clinic. Record the average of the two measurements. To check your blood pressure when you are not at a hospital or clinic, you can use:  An automated blood pressure machine at a pharmacy.  A home blood pressure monitor.  If you are 21-75 years old, ask your health care provider if you should take aspirin to prevent heart disease.  Diabetes screening involves taking a  blood sample to check your fasting blood sugar level. This should be done once every 3 years after age 5 if you are at a normal weight and without risk factors for diabetes. Testing should be considered at a younger age or be carried out more frequently if you are overweight and have at least 1 risk factor for diabetes.  Colorectal cancer can be detected and often prevented. Most routine colorectal cancer screening begins at the age of 63 and continues through age 49. However, your health care provider may recommend screening at an earlier age if you have risk factors for colon cancer. On a yearly basis, your health care provider may provide home test kits to check for hidden blood in the stool. A small camera at the end of a tube may be used to directly examine the colon (sigmoidoscopy or colonoscopy) to detect the earliest forms of colorectal cancer. Talk to your health care provider about this at age 60 when routine screening begins. A  direct exam of the colon should be repeated every 5-10 years through age 12, unless early forms of precancerous polyps or small growths are found.  People who are at an increased risk for hepatitis B should be screened for this virus. You are considered at high risk for hepatitis B if:  You were born in a country where hepatitis B occurs often. Talk with your health care provider about which countries are considered high risk.  Your parents were born in a high-risk country and you have not received a shot to protect against hepatitis B (hepatitis B vaccine).  You have HIV or AIDS.  You use needles to inject street drugs.  You live with, or have sex with, someone who has hepatitis B.  You are a man who has sex with other men (MSM).  You get hemodialysis treatment.  You take certain medicines for conditions like cancer, organ transplantation, and autoimmune conditions.  Hepatitis C blood testing is recommended for all people born from [birth year] through 1966 and any individual with known risk factors for hepatitis C.  Healthy men should no longer receive prostate-specific antigen (PSA) blood tests as part of routine cancer screening. Talk to your health care provider about prostate cancer screening.  Testicular cancer screening is not recommended for adolescents or adult males who have no symptoms. Screening includes self-exam, a health care provider exam, and other screening tests. Consult with your health care provider about any symptoms you have or any concerns you have about testicular cancer.  Practice safe sex. Use condoms and avoid high-risk sexual practices to reduce the spread of sexually transmitted infections (STIs).  You should be screened for STIs, including gonorrhea and chlamydia if:  You are sexually active and are younger than 24 years.  You are older than 24 years, and your health care provider tells you that you are at risk for this type of infection.  Your sexual  activity has changed since you were last screened, and you are at an increased risk for chlamydia or gonorrhea. Ask your health care provider if you are at risk.  If you are at risk of being infected with HIV, it is recommended that you take a prescription medicine daily to prevent HIV infection. This is called pre-exposure prophylaxis (PrEP). You are considered at risk if:  You are a man who has sex with other men (MSM).  You are a heterosexual man who is sexually active with multiple partners.  You take drugs by injection.  You  are sexually active with a partner who has HIV.  Talk with your health care provider about whether you are at high risk of being infected with HIV. If you choose to begin PrEP, you should first be tested for HIV. You should then be tested every 3 months for as long as you are taking PrEP.  Use sunscreen. Apply sunscreen liberally and repeatedly throughout the day. You should seek shade when your shadow is shorter than you. Protect yourself by wearing long sleeves, pants, a wide-brimmed hat, and sunglasses year round whenever you are outdoors.  Tell your health care provider of new moles or changes in moles, especially if there is a change in shape or color. Also, tell your health care provider if a mole is larger than the size of a pencil eraser.  A one-time screening for abdominal aortic aneurysm (AAA) and surgical repair of large AAAs by ultrasound is recommended for men aged 57-75 years who are current or former smokers.  Stay current with your vaccines (immunizations).   This information is not intended to replace advice given to you by your health care provider. Make sure you discuss any questions you have with your health care provider.   Document Released: 10/11/2007 Document Revised: 05/05/2014 Document Reviewed: 09/09/2010 Elsevier Interactive Patient Education 2016 [place] for Routine Care of Injuries Many injuries can be cared for  using rest, ice, compression, and elevation (RICE therapy). Using RICE therapy can help to lessen pain and swelling. It can help your body to heal. Rest Reduce your normal activities and avoid using the injured part of your body. You can go back to your normal activities when you feel okay and your doctor says it is okay. Ice Do not put ice on your bare skin.  Put ice in a plastic bag.  Place a towel between your skin and the bag.  Leave the ice on for 20 minutes, 2-3 times a day. Do this for as long as told by your doctor. Compression Compression means putting pressure on the injured area. This can be done with an elastic bandage. If an elastic bandage has been applied:  Remove and reapply the bandage every 3-4 hours or as told by your doctor.  Make sure the bandage is not wrapped too tight. Wrap the bandage more loosely if part of your body beyond the bandage is blue, swollen, cold, painful, or loses feeling (numb).  See your doctor if the bandage seems to make your problems worse. Elevation Elevation means keeping the injured area raised. Raise the injured area above your heart or the center of your chest if you can. WHEN SHOULD I GET HELP? You should get help if:  You keep having pain and swelling.  Your symptoms get worse. WHEN SHOULD I GET HELP RIGHT AWAY? You should get help right away if:  You have sudden bad pain at or below the area of your injury.  You have redness or more swelling around your injury.  You have tingling or numbness at or below the injury that does not go away when you take off the bandage.   This information is not intended to replace advice given to you by your health care provider. Make sure you discuss any questions you have with your health care provider.   Document Released: 10/01/2007 Document Revised: 01/03/2015 Document Reviewed: 03/22/2014 Elsevier Interactive Patient Education [employer].

## 2016-01-15 ENCOUNTER — Other Ambulatory Visit: Payer: Self-pay | Admitting: Internal Medicine

## 2016-01-15 LAB — BASIC METABOLIC PANEL WITH GFR
BUN: 14 mg/dL (ref 7–25)
CALCIUM: 9.6 mg/dL (ref 8.6–10.3)
CO2: 28 mmol/L (ref 20–31)
Chloride: 105 mmol/L (ref 98–110)
Creat: 1.09 mg/dL (ref 0.70–1.33)
GFR, EST AFRICAN AMERICAN: 87 mL/min (ref >=60)
GFR, EST NON AFRICAN AMERICAN: 75 mL/min (ref >=60)
Glucose, Bld: 105 mg/dL — ABNORMAL HIGH (ref 65–99)
POTASSIUM: 3.8 mmol/L (ref 3.5–5.3)
Sodium: 143 mmol/L (ref 135–146)

## 2016-01-15 MED ORDER — METFORMIN HCL ER 500 MG PO TB24: 500.0000 mg | ORAL_TABLET | Freq: Every day | ORAL | 3 refills | Status: DC

## 2016-01-16 MED FILL — METFORMIN HCL ER 500 MG TAB: 500 | 90 days supply | Qty: 90 | Fill #0

## 2016-01-21 ENCOUNTER — Encounter (HOSPITAL_COMMUNITY): Payer: Self-pay | Admitting: Emergency Medicine

## 2016-01-21 ENCOUNTER — Emergency Department (HOSPITAL_COMMUNITY): Payer: Medicare Other

## 2016-01-21 DIAGNOSIS — I10 Essential (primary) hypertension: Secondary | ICD-10-CM | POA: Diagnosis not present

## 2016-01-21 DIAGNOSIS — Z7984 Long term (current) use of oral hypoglycemic drugs: Secondary | ICD-10-CM | POA: Insufficient documentation

## 2016-01-21 DIAGNOSIS — J449 Chronic obstructive pulmonary disease, unspecified: Secondary | ICD-10-CM | POA: Diagnosis not present

## 2016-01-21 DIAGNOSIS — Z7982 Long term (current) use of aspirin: Secondary | ICD-10-CM | POA: Diagnosis not present

## 2016-01-21 DIAGNOSIS — Z7722 Contact with and (suspected) exposure to environmental tobacco smoke (acute) (chronic): Secondary | ICD-10-CM | POA: Insufficient documentation

## 2016-01-21 DIAGNOSIS — E1140 Type 2 diabetes mellitus with diabetic neuropathy, unspecified: Secondary | ICD-10-CM | POA: Insufficient documentation

## 2016-01-21 DIAGNOSIS — R0602 Shortness of breath: Secondary | ICD-10-CM | POA: Diagnosis not present

## 2016-01-21 DIAGNOSIS — Z79899 Other long term (current) drug therapy: Secondary | ICD-10-CM | POA: Diagnosis not present

## 2016-01-21 DIAGNOSIS — R0789 Other chest pain: Secondary | ICD-10-CM | POA: Diagnosis not present

## 2016-01-21 DIAGNOSIS — R079 Chest pain, unspecified: Secondary | ICD-10-CM | POA: Diagnosis not present

## 2016-01-21 LAB — BASIC METABOLIC PANEL
ANION GAP: 12 (ref 5–15)
BUN: 8 mg/dL (ref 6–20)
CALCIUM: 9.6 mg/dL (ref 8.9–10.3)
CO2: 24 mmol/L (ref 22–32)
Chloride: 104 mmol/L (ref 101–111)
Creatinine, Ser: 1.2 mg/dL (ref 0.61–1.24)
GFR calc Af Amer: >60 mL/min (ref >60)
GLUCOSE: 114 mg/dL — AB (ref 65–99)
POTASSIUM: 3.5 mmol/L (ref 3.5–5.1)
Sodium: 140 mmol/L (ref 135–145)

## 2016-01-21 LAB — CBC
HCT: 45.3 % (ref 39.0–52.0)
HEMOGLOBIN: 15.2 g/dL (ref 13.0–17.0)
MCH: 29.3 pg (ref 26.0–34.0)
MCHC: 33.6 g/dL (ref 30.0–36.0)
MCV: 87.3 fL (ref 78.0–100.0)
Platelets: 206 10*3/uL (ref 150–400)
RBC: 5.19 MIL/uL (ref 4.22–5.81)
RDW: 13.6 % (ref 11.5–15.5)
WBC: 4.7 10*3/uL (ref 4.0–10.5)

## 2016-01-21 LAB — I-STAT TROPONIN, ED: TROPONIN I, POC: 0 ng/mL (ref 0.00–0.08)

## 2016-01-21 NOTE — ED Notes (Signed)
Triage noted entered by Izora Gala, charted under Janett Billow B in error.

## 2016-01-21 NOTE — ED Triage Notes (Signed)
Pt reports generalized chest pain intermittently. Hx of COPD. Pt reports the pain goes across his entire chest. He also reports generalized weakness.

## 2016-01-22 ENCOUNTER — Encounter (HOSPITAL_COMMUNITY): Payer: Self-pay | Admitting: Internal Medicine

## 2016-01-22 ENCOUNTER — Observation Stay (HOSPITAL_COMMUNITY)
Admission: EM | Admit: 2016-01-22 | Discharge: 2016-01-24 | Disposition: A | Discharge status: Home / Self Care | Payer: Medicare Other | Attending: Oncology | Admitting: Oncology

## 2016-01-22 DIAGNOSIS — Z7722 Contact with and (suspected) exposure to environmental tobacco smoke (acute) (chronic): Secondary | ICD-10-CM

## 2016-01-22 DIAGNOSIS — Z7982 Long term (current) use of aspirin: Secondary | ICD-10-CM | POA: Diagnosis not present

## 2016-01-22 DIAGNOSIS — J45901 Unspecified asthma with (acute) exacerbation: Secondary | ICD-10-CM

## 2016-01-22 DIAGNOSIS — R0789 Other chest pain: Secondary | ICD-10-CM | POA: Diagnosis present

## 2016-01-22 DIAGNOSIS — J449 Chronic obstructive pulmonary disease, unspecified: Secondary | ICD-10-CM | POA: Diagnosis not present

## 2016-01-22 DIAGNOSIS — I10 Essential (primary) hypertension: Secondary | ICD-10-CM | POA: Diagnosis not present

## 2016-01-22 DIAGNOSIS — Z79899 Other long term (current) drug therapy: Secondary | ICD-10-CM | POA: Diagnosis not present

## 2016-01-22 DIAGNOSIS — E1140 Type 2 diabetes mellitus with diabetic neuropathy, unspecified: Secondary | ICD-10-CM

## 2016-01-22 DIAGNOSIS — E785 Hyperlipidemia, unspecified: Secondary | ICD-10-CM

## 2016-01-22 DIAGNOSIS — Z7984 Long term (current) use of oral hypoglycemic drugs: Secondary | ICD-10-CM | POA: Diagnosis not present

## 2016-01-22 DIAGNOSIS — J441 Chronic obstructive pulmonary disease with (acute) exacerbation: Secondary | ICD-10-CM | POA: Diagnosis present

## 2016-01-22 DIAGNOSIS — R079 Chest pain, unspecified: Secondary | ICD-10-CM | POA: Diagnosis not present

## 2016-01-22 DIAGNOSIS — J209 Acute bronchitis, unspecified: Secondary | ICD-10-CM | POA: Diagnosis present

## 2016-01-22 HISTORY — DX: Contact with and (suspected) exposure to environmental tobacco smoke (acute) (chronic): Z77.22

## 2016-01-22 HISTORY — DX: Type 2 diabetes mellitus without complications: E11.9

## 2016-01-22 HISTORY — DX: Pure hypercholesterolemia, unspecified: E78.00

## 2016-01-22 HISTORY — DX: Obstructive sleep apnea (adult) (pediatric): G47.33

## 2016-01-22 HISTORY — DX: Polyneuropathy, unspecified: G62.9

## 2016-01-22 HISTORY — DX: Unspecified osteoarthritis, unspecified site: M19.90

## 2016-01-22 HISTORY — DX: Low back pain, unspecified: M54.50

## 2016-01-22 HISTORY — DX: Other chronic pain: G89.29

## 2016-01-22 HISTORY — DX: Dependence on other enabling machines and devices: Z99.89

## 2016-01-22 HISTORY — DX: Low back pain: M54.5

## 2016-01-22 LAB — GLUCOSE, CAPILLARY
GLUCOSE-CAPILLARY: 104 mg/dL — AB (ref 65–99)
GLUCOSE-CAPILLARY: 123 mg/dL — AB (ref 65–99)

## 2016-01-22 LAB — I-STAT TROPONIN, ED: TROPONIN I, POC: 0 ng/mL (ref 0.00–0.08)

## 2016-01-22 LAB — TROPONIN I
Troponin I: <0.03 ng/mL (ref <0.03)
Troponin I: <0.03 ng/mL (ref <0.03)
Troponin I: 0.04 ng/mL (ref <0.03)

## 2016-01-22 LAB — CBG MONITORING, ED: GLUCOSE-CAPILLARY: 101 mg/dL — AB (ref 65–99)

## 2016-01-22 MED ORDER — IPRATROPIUM-ALBUTEROL 0.5-2.5 (3) MG/3ML IN SOLN: 3.0000 mL | RESPIRATORY_TRACT | Status: DC | PRN

## 2016-01-22 MED ORDER — OXYCODONE-ACETAMINOPHEN 5-325 MG PO TABS
1.0000 | ORAL_TABLET | Freq: Four times a day (QID) | ORAL | Status: DC | PRN
  Administered 2016-01-22 – 2016-01-23 (×3): 1 via ORAL
  Filled 2016-01-22 (×4): qty 1

## 2016-01-22 MED ORDER — OXYCODONE HCL 5 MG PO TABS: 5.0000 mg | ORAL_TABLET | Freq: Four times a day (QID) | ORAL | Status: DC | PRN

## 2016-01-22 MED ORDER — LOSARTAN POTASSIUM 50 MG PO TABS
50.0000 mg | ORAL_TABLET | Freq: Every day | ORAL | Status: DC
  Administered 2016-01-22 – 2016-01-24 (×3): 50 mg via ORAL
  Filled 2016-01-22 (×3): qty 1

## 2016-01-22 MED ORDER — ASPIRIN EC 81 MG PO TBEC
81.0000 mg | DELAYED_RELEASE_TABLET | Freq: Every day | ORAL | Status: DC
  Administered 2016-01-22 – 2016-01-24 (×3): 81 mg via ORAL
  Filled 2016-01-22 (×3): qty 1

## 2016-01-22 MED ORDER — PREDNISONE 20 MG PO TABS
40.0000 mg | ORAL_TABLET | Freq: Every day | ORAL | Status: DC
  Administered 2016-01-22 – 2016-01-24 (×3): 40 mg via ORAL
  Filled 2016-01-22 (×3): qty 2

## 2016-01-22 MED ORDER — ONDANSETRON HCL 4 MG PO TABS: 8.0000 mg | ORAL_TABLET | Freq: Three times a day (TID) | ORAL | Status: DC | PRN

## 2016-01-22 MED ORDER — OXYCODONE-ACETAMINOPHEN 10-325 MG PO TABS: 1.0000 | ORAL_TABLET | Freq: Four times a day (QID) | ORAL | Status: DC | PRN

## 2016-01-22 MED ORDER — ENOXAPARIN SODIUM 40 MG/0.4ML ~~LOC~~ SOLN
40.0000 mg | SUBCUTANEOUS | Status: DC
  Administered 2016-01-22: 40 mg via SUBCUTANEOUS
  Filled 2016-01-22 (×2): qty 0.4

## 2016-01-22 MED ORDER — MORPHINE SULFATE (PF) 4 MG/ML IV SOLN
4.0000 mg | Freq: Once | INTRAVENOUS | Status: AC
  Administered 2016-01-22: 4 mg via INTRAVENOUS
  Filled 2016-01-22: qty 1

## 2016-01-22 MED ORDER — MELOXICAM 7.5 MG PO TABS
15.0000 mg | ORAL_TABLET | Freq: Every day | ORAL | Status: DC
  Administered 2016-01-23 – 2016-01-24 (×2): 15 mg via ORAL
  Filled 2016-01-22: qty 1
  Filled 2016-01-22 (×2): qty 2

## 2016-01-22 MED ORDER — IPRATROPIUM-ALBUTEROL 0.5-2.5 (3) MG/3ML IN SOLN
3.0000 mL | Freq: Once | RESPIRATORY_TRACT | Status: AC
  Administered 2016-01-22: 3 mL via RESPIRATORY_TRACT
  Filled 2016-01-22: qty 3

## 2016-01-22 MED ORDER — IPRATROPIUM-ALBUTEROL 0.5-2.5 (3) MG/3ML IN SOLN
3.0000 mL | RESPIRATORY_TRACT | Status: DC
  Administered 2016-01-22: 3 mL via RESPIRATORY_TRACT
  Filled 2016-01-22: qty 3

## 2016-01-22 MED ORDER — SIMVASTATIN 40 MG PO TABS
40.0000 mg | ORAL_TABLET | Freq: Every day | ORAL | Status: DC
  Administered 2016-01-22: 40 mg via ORAL
  Filled 2016-01-22: qty 1

## 2016-01-22 MED ORDER — KETOROLAC TROMETHAMINE 30 MG/ML IJ SOLN
30.0000 mg | Freq: Once | INTRAMUSCULAR | Status: AC
  Administered 2016-01-22: 30 mg via INTRAVENOUS
  Filled 2016-01-22: qty 1

## 2016-01-22 MED ORDER — SODIUM CHLORIDE 0.9% FLUSH
3.0000 mL | Freq: Two times a day (BID) | INTRAVENOUS | Status: DC
  Administered 2016-01-22 – 2016-01-23 (×2): 3 mL via INTRAVENOUS

## 2016-01-22 MED ORDER — HYDROCHLOROTHIAZIDE 25 MG PO TABS
12.5000 mg | ORAL_TABLET | Freq: Every day | ORAL | Status: DC
  Administered 2016-01-22 – 2016-01-24 (×3): 12.5 mg via ORAL
  Filled 2016-01-22 (×3): qty 1

## 2016-01-22 MED ORDER — ASPIRIN 81 MG PO CHEW
324.0000 mg | CHEWABLE_TABLET | Freq: Once | ORAL | Status: AC
  Administered 2016-01-22: 324 mg via ORAL
  Filled 2016-01-22: qty 4

## 2016-01-22 MED ORDER — PREDNISONE 20 MG PO TABS
40.0000 mg | ORAL_TABLET | Freq: Once | ORAL | Status: DC
  Filled 2016-01-22: qty 2

## 2016-01-22 MED ORDER — MORPHINE SULFATE (PF) 4 MG/ML IV SOLN
4.0000 mg | Freq: Once | INTRAVENOUS | Status: AC
Start: 2016-01-22 — End: 2016-01-22
  Administered 2016-01-22: 4 mg via INTRAVENOUS
  Filled 2016-01-22: qty 1

## 2016-01-22 MED ORDER — AMLODIPINE BESYLATE 10 MG PO TABS
10.0000 mg | ORAL_TABLET | Freq: Every day | ORAL | Status: DC
  Administered 2016-01-22 – 2016-01-24 (×3): 10 mg via ORAL
  Filled 2016-01-22: qty 1
  Filled 2016-01-22: qty 2
  Filled 2016-01-22: qty 1

## 2016-01-22 MED ORDER — MOMETASONE FURO-FORMOTEROL FUM 200-5 MCG/ACT IN AERO
2.0000 | INHALATION_SPRAY | Freq: Two times a day (BID) | RESPIRATORY_TRACT | Status: DC
  Administered 2016-01-23 – 2016-01-24 (×3): 2 via RESPIRATORY_TRACT
  Filled 2016-01-22: qty 8.8

## 2016-01-22 MED ORDER — INSULIN ASPART 100 UNIT/ML ~~LOC~~ SOLN: 0.0000 [IU] | Freq: Every day | SUBCUTANEOUS | Status: DC

## 2016-01-22 MED ORDER — INSULIN ASPART 100 UNIT/ML ~~LOC~~ SOLN
0.0000 [IU] | Freq: Three times a day (TID) | SUBCUTANEOUS | Status: DC
  Administered 2016-01-23: 2 [IU] via SUBCUTANEOUS
  Administered 2016-01-23: 3 [IU] via SUBCUTANEOUS
  Administered 2016-01-24: 1 [IU] via SUBCUTANEOUS

## 2016-01-22 MED ORDER — GABAPENTIN 300 MG PO CAPS
300.0000 mg | ORAL_CAPSULE | Freq: Four times a day (QID) | ORAL | Status: DC
  Administered 2016-01-22: 300 mg via ORAL
  Filled 2016-01-22: qty 1

## 2016-01-22 MED ORDER — GABAPENTIN 300 MG PO CAPS
300.0000 mg | ORAL_CAPSULE | Freq: Four times a day (QID) | ORAL | Status: DC
  Administered 2016-01-22 – 2016-01-24 (×6): 300 mg via ORAL
  Filled 2016-01-22 (×7): qty 1

## 2016-01-22 MED ORDER — NITROGLYCERIN 0.4 MG SL SUBL
0.4000 mg | SUBLINGUAL_TABLET | SUBLINGUAL | Status: DC | PRN
  Administered 2016-01-22: 0.4 mg via SUBLINGUAL
  Filled 2016-01-22: qty 1

## 2016-01-22 NOTE — H&P (Signed)
Date: 01/22/2016               Patient Name:  Christian [name] MRN: [id number]  DOB: 1960-08-21 Age / Sex: 55 y.o., male   PCP: Christian Reamer, MD         Medical Service: Internal Medicine Teaching Service         Attending Physician: Dr. Annia Belt, MD    First Contact: Dr. Inda Castle Pager: [phone]  Second Contact: Dr. Quay Burow Pager: [phone]       After Hours (After 5p/  First Contact Pager: [phone]  weekends / holidays): Second Contact Pager: [phone]   Chief Complaint: Chest pain  History of Present Illness: Christian [name] is a 55yo man with PMHx of HTN, COPD, and asthma who presented to the ED yesterday with chest pain. He reports his chest pain started 3 days ago and became much worse yesterday. He describes the pain as 9/10 in severity, sharp and "stabby" in nature, constant, located on both sides of his chest, and non-radiating. He reports shortness of breath, wheezing, and productive cough of yellow mucus for the last 1 week. He denies associated nausea and diaphoresis. He also notes having a fever of 100.3 at home yesterday before coming to the ED. He reports his abdomen feels "sore" from coughing. He states he will sometimes get chest pain if he takes a big breath or has been coughing. Denies any prior cardiac history.   In the ED, his EKG shows T wave inversions in the lateral leads, but this is unchanged from EKG in May this year. His initial two I-stat troponins were negative.   Meds:  Current Meds  Medication Sig  . albuterol (PROVENTIL HFA;VENTOLIN HFA) 108 (90 Base) MCG/ACT inhaler Inhale 1-2 puffs into the lungs every 6 (six) hours as needed for wheezing or shortness of breath.  Marland Kitchen albuterol (PROVENTIL) (2.5 MG/3ML) 0.083% nebulizer solution Take 2.5 mg by nebulization every 2 (two) hours as needed for wheezing or shortness of breath.  Marland Kitchen amLODipine (NORVASC) 10 MG tablet Take 1 tablet (10 mg total) by mouth daily.  Marland Kitchen aspirin EC 81 MG tablet Take 1 tablet (81 mg  total) by mouth daily.  . budesonide-formoterol (SYMBICORT) 160-4.5 MCG/ACT inhaler Inhale 2 puffs into the lungs 2 (two) times daily.  . carbamide peroxide (DEBROX) 6.5 % otic solution Place 5 drops into both ears 2 (two) times daily.  . carisoprodol (SOMA) 350 MG tablet Take 1 tablet (350 mg total) by mouth 4 (four) times daily as needed for muscle spasms.  . cetirizine (ZYRTEC) 10 MG tablet Take 10 mg by mouth daily.  . cyclobenzaprine (FLEXERIL) 10 MG tablet Take 10 mg by mouth 3 (three) times daily as needed for muscle spasms.  . diclofenac (VOLTAREN) 75 MG EC tablet Take 75 mg by mouth 2 (two) times daily.  . Fluticasone-Salmeterol (ADVAIR) 500-50 MCG/DOSE AEPB Inhale 1 puff into the lungs 2 (two) times daily.  Marland Kitchen gabapentin (NEURONTIN) 300 MG capsule Take 1 capsule (300 mg total) by mouth 4 (four) times daily.  . hydrochlorothiazide (HYDRODIURIL) 12.5 MG tablet Take 1 tablet (12.5 mg total) by mouth daily.  Marland Kitchen ipratropium (ATROVENT) 0.02 % nebulizer solution Take 0.5 mg by nebulization 4 (four) times daily.  Marland Kitchen losartan (COZAAR) 50 MG tablet Take 1 tablet (50 mg total) by mouth daily.  . meloxicam (MOBIC) 15 MG tablet Take 15 mg by mouth daily.  . metFORMIN (GLUCOPHAGE XR) 500 MG 24 hr tablet Take 1  tablet (500 mg total) by mouth daily with breakfast.  . oxyCODONE-acetaminophen (PERCOCET) 10-325 MG tablet Take 1 tablet by mouth every 6 (six) hours as needed for pain.  . simvastatin (ZOCOR) 40 MG tablet Take 1 tablet (40 mg total) by mouth daily.     Allergies: Allergies as of 01/21/2016 - Review Complete 01/21/2016  Allergen Reaction Noted  . Phenergan [promethazine hcl] Itching 09/15/2015   Past Medical History:  Diagnosis Date  . Asthma   . COPD (chronic obstructive pulmonary disease) ([place])   . Hypertension     Family History: Mother- HTN. Father- HTN. Brother- heart transplant, pt does not know cause.   Social History: Never smoker. Denies alcohol or illicit drug use. Recently  moved to [place] from [place]. Lives with wife at home.   Review of Systems: A complete ROS was negative except as per HPI.   Physical Exam: Blood pressure 129/90, pulse 76, temperature 97.6 F (36.4 C), temperature source Oral, resp. rate 19, SpO2 97 %. General: well-nourished man sitting up in bed, getting a breathing treatment, NAD HEENT: Christian [name]/AT, EOMI, pharynx non-erythematous, mucus membranes moist CV: RRR, no m/g/r. Tenderness to palpation of left and right upper chest.  Pulm: CTA bilaterally, breaths non-labored while getting breathing treatment. No wheezing heard. Abd: BS+, soft, mild tenderness to palpation of epigastrium Ext: warm, no peripheral edema Neuro: alert and oriented x 3, no focal deficits   EKG: Sinus rhythm. T wave inversions in II, III, avF, V4-V6, essentially unchanged from prior EKG  CXR: No acute abnormalities.   Labs:  Cr 1.20, baseline 1.1 istat trop 0.00>0.00   Assessment & Plan by Problem:  Likely COPD Exacerbation: Patient presented with a 3 day history of worsening chest pain and 1 week history of SOB, wheezing, and productive cough. His EKG has T wave inversions in the inferolateral leads but this is unchanged from prior. Troponins have been negative. His chest pain seems more musculoskeletal in nature given his tenderness to palpation on exam and association with coughing. His clinical picture seems more consistent with a COPD exacerbation. Will trend his troponins to rule out ACS but is very unlikely as this point. CXR negative for any pneumonia. - Trend troponins - Repeat EKG as needed for chest pain - Duonebs Q6H - Restart home Dulera and albuterol inhalers - Prednisone 40 mg daily x 5 days - Will hold off on antibiotics as his exacerbation seems mild  HTN: BP stable in 120s-140s.  - Continue Amlodipine 10 mg daily - Continue Losartan 50 mg daily - Continue HCTZ 12.5 mg daily   Type 2 DM: HbA1c on 9/18 was 6.5. Patient reports his new  PCP started him on Metformin XR 500 mg daily.  - Hold Metformin - Start sensitive ISS - CBGs with meals and bedtime  - Continue statin   Chronic Pain from Back Surgeries: Patient reports he had 2 back surgeries a few years ago and has been on Percocet for pain management ever since. He also notes chronic numbness in his left hand which he attributes to the surgery and he is on Gabapentin. He has recently moved from [place], [place] but states he established with [facility]. Per [place] controlled substance database, his last prescription for Percocet was in January this year. He also has Soma on his medication list but states he is not taking this medication. Will provide pain medication while in the hospital, but he needs to discuss further pain management with his new PCP.  -  Continue Percocet 10-325 mg every 6 hours PRN - Continue Gabapentin 300 mg QID  Diet: Heart healthy DVT Ppx: Lovenox SQ Dispo: Admit patient to Observation with expected length of stay less than 2 midnights.  Signed: Juliet Rude, MD 01/22/2016, 10:31 AM  Pager: [phone]

## 2016-01-22 NOTE — ED Notes (Signed)
Dr. Roxanne Mins notified of pt's diminished breath sounds with inspiratory wheezes in the lower lobes bilaterally.  Inspiratory wheezes noted bilaterally in the upper lobes.  Awaiting new orders.

## 2016-01-22 NOTE — Care Management Obs Status (Signed)
[facility] NOTIFICATION   Patient Details  Name: Demetric Northrip MRN: [id number] Date of Birth: 19-Feb-1961   Medicare Observation Status Notification Given:  Yes    Vergie Living, RN 01/22/2016, 2:20 PM

## 2016-01-22 NOTE — Progress Notes (Signed)
Notified by lab of critical troponin  0.04. MD made aware. EKG preformed prior to lab value. Will continue to monitor.

## 2016-01-22 NOTE — ED Provider Notes (Signed)
[facility] DEPT Provider Note   CSN: [id number] Arrival date & time: 01/21/16  1856  By signing my name below, I, Christian [name], attest that this documentation has been prepared under the direction and in the presence of Delora Fuel, MD. Electronically Signed: Johnney [name], ED Scribe. 01/22/16. 2:17 AM.   History   Chief Complaint Chief Complaint  Patient presents with  . Chest Pain  . Weakness    HPI Comments: Christian [name] is a 55 y.o. male who presents to the Emergency Department complaining of sharp, intermittent chest pain on both sides of his chest that started 3 days PTA. Pt says his episodes of pain increase gradually in intensity with increased SOB. Pt says he has taken his usual treatments for the pain with no relief of symptoms. He says his pain is not exacerbated by exercise or movement. Pt says he has never smoked. Pt reports PMHx of COPD and asthma and says he was diagnosed with DM 3 days ago. Pt says he has had a fever measured at 100.42F, cough productive of yellow sputum, back pain, and weakness. He denies recent immobility, history of blood clots, recent surgeries, nausea, sweating, or chills. Pt says he is followed by his PCP at the [facility] and visited one week ago. He says his brother had a heart transplant. He denies family history of MI.   The history is provided by the patient. No language interpreter was used.    Past Medical History:  Diagnosis Date  . Asthma   . COPD (chronic obstructive pulmonary disease) ([facility])   . Hypertension     There are no active problems to display for this patient.   Past Surgical History:  Procedure Laterality Date  . BACK SURGERY         Home Medications    Prior to Admission medications   Medication Sig Start Date End Date Taking? Authorizing Provider  albuterol (PROVENTIL HFA;VENTOLIN HFA) 108 (90 Base) MCG/ACT inhaler Inhale 1-2 puffs into the lungs every 6 (six) hours as needed for  wheezing or shortness of breath. 01/14/16   Maren Reamer, MD  albuterol (PROVENTIL) (2.5 MG/3ML) 0.083% nebulizer solution Take 2.5 mg by nebulization every 2 (two) hours as needed for wheezing or shortness of breath.    Historical Provider, MD  amLODipine (NORVASC) 10 MG tablet Take 1 tablet (10 mg total) by mouth daily. 01/14/16   Maren Reamer, MD  aspirin EC 81 MG tablet Take 1 tablet (81 mg total) by mouth daily. 01/14/16   Maren Reamer, MD  budesonide-formoterol (SYMBICORT) 160-4.5 MCG/ACT inhaler Inhale 2 puffs into the lungs 2 (two) times daily. 01/14/16   Maren Reamer, MD  carbamide peroxide (DEBROX) 6.5 % otic solution Place 5 drops into both ears 2 (two) times daily. 01/14/16   Maren Reamer, MD  carisoprodol (SOMA) 350 MG tablet Take 1 tablet (350 mg total) by mouth 4 (four) times daily as needed for muscle spasms. 01/14/16   Maren Reamer, MD  cetirizine (ZYRTEC) 10 MG tablet Take 10 mg by mouth daily.    Historical Provider, MD  cyclobenzaprine (FLEXERIL) 10 MG tablet Take 10 mg by mouth 3 (three) times daily as needed for muscle spasms.    Historical Provider, MD  diclofenac (VOLTAREN) 75 MG EC tablet Take 75 mg by mouth 2 (two) times daily.    Historical Provider, MD  Fluticasone-Salmeterol (ADVAIR) 500-50 MCG/DOSE AEPB Inhale 1 puff into the lungs 2 (two) times  daily.    Historical Provider, MD  gabapentin (NEURONTIN) 300 MG capsule Take 1 capsule (300 mg total) by mouth 4 (four) times daily. 01/14/16   Maren Reamer, MD  hydrochlorothiazide (HYDRODIURIL) 12.5 MG tablet Take 1 tablet (12.5 mg total) by mouth daily. 01/14/16   Maren Reamer, MD  ipratropium (ATROVENT) 0.02 % nebulizer solution Take 0.5 mg by nebulization 4 (four) times daily.    Historical Provider, MD  losartan (COZAAR) 50 MG tablet Take 1 tablet (50 mg total) by mouth daily. 01/14/16   Maren Reamer, MD  meloxicam (MOBIC) 15 MG tablet Take 15 mg by mouth daily.    Historical Provider, MD    metFORMIN (GLUCOPHAGE XR) 500 MG 24 hr tablet Take 1 tablet (500 mg total) by mouth daily with breakfast. 01/15/16   Maren Reamer, MD  oxyCODONE-acetaminophen (PERCOCET) 10-325 MG tablet Take 1 tablet by mouth every 6 (six) hours as needed for pain.    Historical Provider, MD  simvastatin (ZOCOR) 40 MG tablet Take 1 tablet (40 mg total) by mouth daily. 01/14/16   Maren Reamer, MD    Family History History reviewed. No pertinent family history.  Social History Social History  Substance Use Topics  . Smoking status: Never Smoker  . Smokeless tobacco: Never Used  . Alcohol use No     Allergies   Phenergan [promethazine hcl]   Review of Systems Review of Systems  Constitutional: Positive for fever. Negative for chills and diaphoresis.  Respiratory: Positive for cough and shortness of breath.   Cardiovascular: Positive for chest pain.  Gastrointestinal: Negative for nausea.  Musculoskeletal: Positive for back pain.  Neurological: Positive for weakness.  All other systems reviewed and are negative.    Physical Exam Updated Vital Signs BP 128/85 (BP Location: Left Arm)   Pulse 92   Temp 99.3 F (37.4 C) (Oral)   Resp 14   SpO2 96%   Physical Exam  Constitutional: He is oriented to person, place, and time. He appears well-developed and well-nourished.  HENT:  Head: Normocephalic and atraumatic.  Eyes: EOM are normal. Pupils are equal, round, and reactive to light.  Neck: Normal range of motion. Neck supple. No JVD present.  Cardiovascular: Normal rate, regular rhythm and normal heart sounds.   No murmur heard. Pulmonary/Chest: Effort normal and breath sounds normal. He has no wheezes. He has no rales. He exhibits no tenderness.  Abdominal: Soft. Bowel sounds are normal. He exhibits no distension and no mass. There is no tenderness.  Musculoskeletal: Normal range of motion. He exhibits no edema.  Lymphadenopathy:    He has no cervical adenopathy.  Neurological:  He is alert and oriented to person, place, and time. No cranial nerve deficit. He exhibits normal muscle tone. Coordination normal.  Skin: Skin is warm and dry. No rash noted.  Psychiatric: He has a normal mood and affect. His behavior is normal. Judgment and thought content normal.  Nursing note and vitals reviewed.    ED Treatments / Results   DIAGNOSTIC STUDIES: Oxygen Saturation is 96% on RA, adequate by my interpretation.    COORDINATION OF CARE: 2:11 AM Discussed treatment plan with pt at bedside and pt agreed to plan.   Labs (all labs ordered are listed, but only abnormal results are displayed) Labs Reviewed  BASIC METABOLIC PANEL - Abnormal; Notable for the following:       Result Value   Glucose, Bld 114 (*)    All other components within normal  limits  CBC  I-STAT TROPOININ, ED  I-STAT TROPOININ, ED    EKG  EKG Interpretation  Date/Time:  Monday January 21 2016 18:59:16 EDT Ventricular Rate:  91 PR Interval:  194 QRS Duration: 88 QT Interval:  344 QTC Calculation: 423 R Axis:   82 Text Interpretation:  Normal sinus rhythm T wave abnormality, consider inferolateral ischemia Abnormal ECG When compared with ECG of 09/15/2015, T wave abnormality is much more evident in inferior and anterolateral leads Confirmed by [facility]  MD, Burhan Barham ([id number]) on 01/22/2016 1:59:58 AM       EKG Interpretation  Date/Time:  Tuesday January 22 2016 03:59:13 EDT Ventricular Rate:  76 PR Interval:  194 QRS Duration: 94 QT Interval:  352 QTC Calculation: 396 R Axis:   52 Text Interpretation:  Sinus rhythm Borderline prolonged PR interval Anteroseptal infarct, old Nonspecific T abnormalities, lateral leads  When compared with ECG of 01/21/2016, Nonspecific T wave abnormality are slightly improved Confirmed by [facility]  MD, Taytem Ghattas ([id number]) on 01/22/2016 8:25:01 AM       Radiology Dg Chest 2 View  Result Date: 01/21/2016 CLINICAL DATA:  Chest pain shortness of breath for 1 day. EXAM:  CHEST  2 VIEW COMPARISON:  Sep 15, 2015 FINDINGS: The heart size and mediastinal contours are within normal limits. There is no focal infiltrate, pulmonary edema, or pleural effusion. The visualized skeletal structures are stable. IMPRESSION: No active cardiopulmonary disease. Electronically Signed   By: Abelardo Diesel M.D.   On: 01/21/2016 19:46    Procedures Procedures (including critical care time)  Medications Ordered in ED Medications  aspirin chewable tablet 324 mg (not administered)  nitroGLYCERIN (NITROSTAT) SL tablet 0.4 mg (not administered)     Initial Impression / Assessment and Plan / ED Course  I have reviewed the triage vital signs and the nursing notes.  Pertinent labs & imaging results that were available during my care of the patient were reviewed by me and considered in my medical decision making (see chart for details).  Clinical Course   Chest pain of uncertain cause. History is not strongly suspicious for ACS, but ECG does show new T-wave inversions. He is PERC negative. He was given aspirin and therapeutic child nitroglycerin with no improvement. He was given ketorolac with no improvement. He is given morphine and, after 2 doses, still has some persistent pain. Because of inability to control pain and ECG changes, I feel he should be admitted for cardiology evaluation. ECG was repeated and there was some improvement in his T-wave inversions. Troponin has been repeated and continues to be normal.  After 8 mg of morphine, he continues to have pain. I am waiting to hear back from hospitalist to get him admitted.  Final Clinical Impressions(s) / ED Diagnoses   Final diagnoses:  Chest pain, unspecified chest pain type    New Prescriptions New Prescriptions   No medications on file   I personally performed the services described in this documentation, which was scribed in my presence. The recorded information has been reviewed and is accurate.        Delora Fuel, MD [id number]

## 2016-01-22 NOTE — ED Notes (Signed)
CBG 101 

## 2016-01-22 NOTE — ED Notes (Signed)
Pt reports that his CP has worsened since having the NTG. He is refusing to take any more NTG. Also requesting a breathing tx.

## 2016-01-23 ENCOUNTER — Encounter (HOSPITAL_COMMUNITY): Payer: Self-pay | Admitting: Physician Assistant

## 2016-01-23 ENCOUNTER — Observation Stay (HOSPITAL_BASED_OUTPATIENT_CLINIC_OR_DEPARTMENT_OTHER): Payer: Medicare Other

## 2016-01-23 DIAGNOSIS — J209 Acute bronchitis, unspecified: Secondary | ICD-10-CM | POA: Diagnosis not present

## 2016-01-23 DIAGNOSIS — E785 Hyperlipidemia, unspecified: Secondary | ICD-10-CM

## 2016-01-23 DIAGNOSIS — R079 Chest pain, unspecified: Secondary | ICD-10-CM

## 2016-01-23 DIAGNOSIS — E1140 Type 2 diabetes mellitus with diabetic neuropathy, unspecified: Secondary | ICD-10-CM | POA: Diagnosis not present

## 2016-01-23 DIAGNOSIS — E119 Type 2 diabetes mellitus without complications: Secondary | ICD-10-CM | POA: Diagnosis not present

## 2016-01-23 DIAGNOSIS — J45909 Unspecified asthma, uncomplicated: Secondary | ICD-10-CM | POA: Diagnosis not present

## 2016-01-23 DIAGNOSIS — Z7722 Contact with and (suspected) exposure to environmental tobacco smoke (acute) (chronic): Secondary | ICD-10-CM

## 2016-01-23 DIAGNOSIS — J441 Chronic obstructive pulmonary disease with (acute) exacerbation: Secondary | ICD-10-CM

## 2016-01-23 DIAGNOSIS — G8929 Other chronic pain: Secondary | ICD-10-CM

## 2016-01-23 DIAGNOSIS — I10 Essential (primary) hypertension: Secondary | ICD-10-CM

## 2016-01-23 DIAGNOSIS — M549 Dorsalgia, unspecified: Secondary | ICD-10-CM

## 2016-01-23 LAB — BASIC METABOLIC PANEL
Anion gap: 12 (ref 5–15)
BUN: 14 mg/dL (ref 6–20)
CALCIUM: 9.4 mg/dL (ref 8.9–10.3)
CO2: 25 mmol/L (ref 22–32)
CREATININE: 1.07 mg/dL (ref 0.61–1.24)
Chloride: 98 mmol/L — ABNORMAL LOW (ref 101–111)
GFR calc non Af Amer: >60 mL/min (ref >60)
GLUCOSE: 172 mg/dL — AB (ref 65–99)
Potassium: 3.8 mmol/L (ref 3.5–5.1)
Sodium: 135 mmol/L (ref 135–145)

## 2016-01-23 LAB — ECHOCARDIOGRAM COMPLETE: Weight: 2836.8 oz

## 2016-01-23 LAB — GLUCOSE, CAPILLARY
GLUCOSE-CAPILLARY: 124 mg/dL — AB (ref 65–99)
GLUCOSE-CAPILLARY: 193 mg/dL — AB (ref 65–99)
GLUCOSE-CAPILLARY: 212 mg/dL — AB (ref 65–99)
Glucose-Capillary: 130 mg/dL — ABNORMAL HIGH (ref 65–99)

## 2016-01-23 LAB — TROPONIN I

## 2016-01-23 LAB — HIV ANTIBODY (ROUTINE TESTING W REFLEX): HIV Screen 4th Generation wRfx: NONREACTIVE

## 2016-01-23 MED ORDER — IPRATROPIUM-ALBUTEROL 0.5-2.5 (3) MG/3ML IN SOLN
3.0000 mL | Freq: Three times a day (TID) | RESPIRATORY_TRACT | Status: DC
  Administered 2016-01-24: 3 mL via RESPIRATORY_TRACT
  Filled 2016-01-23: qty 3

## 2016-01-23 MED ORDER — ATORVASTATIN CALCIUM 20 MG PO TABS
20.0000 mg | ORAL_TABLET | Freq: Every day | ORAL | Status: DC
  Administered 2016-01-23: 20 mg via ORAL
  Filled 2016-01-23: qty 1

## 2016-01-23 MED ORDER — FLUTICASONE PROPIONATE 50 MCG/ACT NA SUSP
2.0000 | Freq: Every day | NASAL | Status: DC
  Administered 2016-01-24: 2 via NASAL
  Filled 2016-01-23: qty 16

## 2016-01-23 MED ORDER — AZITHROMYCIN 250 MG PO TABS
500.0000 mg | ORAL_TABLET | Freq: Every day | ORAL | Status: AC
  Administered 2016-01-23: 500 mg via ORAL
  Filled 2016-01-23: qty 2

## 2016-01-23 MED ORDER — AZITHROMYCIN 250 MG PO TABS
250.0000 mg | ORAL_TABLET | Freq: Every day | ORAL | Status: DC
  Administered 2016-01-24: 250 mg via ORAL
  Filled 2016-01-23: qty 1

## 2016-01-23 MED ORDER — DICLOFENAC SODIUM 1 % TD GEL
2.0000 g | Freq: Four times a day (QID) | TRANSDERMAL | Status: DC
  Administered 2016-01-23 – 2016-01-24 (×3): 2 g via TOPICAL
  Filled 2016-01-23: qty 100

## 2016-01-23 MED ORDER — IPRATROPIUM-ALBUTEROL 0.5-2.5 (3) MG/3ML IN SOLN
3.0000 mL | Freq: Four times a day (QID) | RESPIRATORY_TRACT | Status: DC
  Administered 2016-01-23 (×3): 3 mL via RESPIRATORY_TRACT
  Filled 2016-01-23 (×3): qty 3

## 2016-01-23 MED ORDER — GUAIFENESIN ER 600 MG PO TB12
600.0000 mg | ORAL_TABLET | Freq: Two times a day (BID) | ORAL | Status: DC
  Administered 2016-01-23 – 2016-01-24 (×2): 600 mg via ORAL
  Filled 2016-01-23 (×3): qty 1

## 2016-01-23 MED ORDER — GUAIFENESIN-DM 100-10 MG/5ML PO SYRP: 5.0000 mL | ORAL_SOLUTION | ORAL | Status: DC | PRN

## 2016-01-23 NOTE — Progress Notes (Signed)
Responded to consult. Patient desired prayer. We also discussed his family situation. His wife is home alone after open-heart surgery (he had been her primary caregiver) and their son drives long-haul trucks. Patient had attended a nearby church since moving to area. We discussed how he could ask to see a Education officer, museum while here or call his pastor/church and let them know his situation to explore what community resources might be available to assist with any needed home health care. Chaplain is available for any desired follow-up.    01/23/16 1030  Clinical Encounter Type  Visited With Patient  Visit Type Initial  Referral From Nurse  Spiritual Encounters  Spiritual Needs Prayer;Emotional  Stress Factors  Patient Stress Factors Exhausted;Family relationships;Health changes  Family Stress Factors Family relationships;Health changes;Lack of caregivers

## 2016-01-23 NOTE — Consult Note (Signed)
Cardiology Consultation Note    Patient ID: Christian [name], MRN: [id number], DOB/AGE: August 03, 1960 55 y.o. Admit date: 01/22/2016   Date of Consult: 01/23/2016 Primary Physician: Maren Reamer, MD Primary Cardiologist: New to Dr. Debara Pickett  Chief Complaint: chest pain Reason for Consultation: chest pain Requesting MD: chest pain unit consult per charge nurse - Dr. Arcelia Jew  HPI: Christian [name] is a 55 y.o. male with history of HTN, HLD, recently diagnosed DM, COPD from secondhand smoke exposure, asthma, chronic back issues s/p neck/spine surgery, chronic left knee problems who presented to [facility] yesterday with chest pain. He is somewhat of a challenging historian to follow at times. Fort he last week he has had cold symptoms with congestion, fever to 100.3, feeling "hot but not sweaty," weaker than usual with decreased appetite. Since that time he has had intermittent chest pain out of nowhere lasting for long periods of time, up to an hour, which he describes as constant across his chest and into his back, associated with tenderness of palpation. He has had a lot of coughing of yellow sputum and wheezing and states as a result his stomach has been sore. His wife had open heart surgery a month ago and he has been somewhat stressed about this. He walks his grandson to the bus stop and has not had any exertional chest pain or dyspnea. His activity is otherwise limited by left knee pain. He came into the hospital due to the persistent nature of his symptoms were VSS. Troponins 0.00-0.00-<0.03-<0.03-0.04-<0.03. Glucose elevated but recent A1C 6.5. CXR NAD. No current complaints except "weak."  + fam hx of some sort of heart issue - brother had heart transplant but pt does not know further details.   Past Medical History:  Diagnosis Date  . Arthritis    "left knee" (01/22/2016)  . Asthma   . Chronic lower back pain   . COPD (chronic obstructive pulmonary disease) ([place])   . High cholesterol   .  Hypertension   . OSA on CPAP   . Secondhand smoke exposure   . Type II diabetes mellitus ([place]) dx'd 01/17/2016      Surgical History:  Past Surgical History:  Procedure Laterality Date  . ANTERIOR CERVICAL DECOMP/DISCECTOMY FUSION  ~ 1995   "titanium"  . APPENDECTOMY  ~ 1977  . BACK SURGERY    . INGUINAL HERNIA REPAIR Right ~ 2000  . LUMBAR DISC SURGERY  ~ 2000; ~2006  . NASAL SINUS SURGERY  ~ 2007     Home Meds: Prior to Admission medications   Medication Sig Start Date End Date Taking? Authorizing Provider  albuterol (PROVENTIL HFA;VENTOLIN HFA) 108 (90 Base) MCG/ACT inhaler Inhale 1-2 puffs into the lungs every 6 (six) hours as needed for wheezing or shortness of breath. 01/14/16  Yes Maren Reamer, MD  albuterol (PROVENTIL) (2.5 MG/3ML) 0.083% nebulizer solution Take 2.5 mg by nebulization every 2 (two) hours as needed for wheezing or shortness of breath.   Yes Historical Provider, MD  amLODipine (NORVASC) 10 MG tablet Take 1 tablet (10 mg total) by mouth daily. 01/14/16  Yes Maren Reamer, MD  aspirin EC 81 MG tablet Take 1 tablet (81 mg total) by mouth daily. 01/14/16  Yes Maren Reamer, MD  budesonide-formoterol (SYMBICORT) 160-4.5 MCG/ACT inhaler Inhale 2 puffs into the lungs 2 (two) times daily. 01/14/16  Yes Maren Reamer, MD  carbamide peroxide (DEBROX) 6.5 % otic solution Place 5 drops into both ears 2 (two) times  daily. 01/14/16  Yes Maren Reamer, MD  carisoprodol (SOMA) 350 MG tablet Take 1 tablet (350 mg total) by mouth 4 (four) times daily as needed for muscle spasms. 01/14/16  Yes Maren Reamer, MD  cetirizine (ZYRTEC) 10 MG tablet Take 10 mg by mouth daily.   Yes Historical Provider, MD  cyclobenzaprine (FLEXERIL) 10 MG tablet Take 10 mg by mouth 3 (three) times daily as needed for muscle spasms.   Yes Historical Provider, MD  diclofenac (VOLTAREN) 75 MG EC tablet Take 75 mg by mouth 2 (two) times daily.   Yes Historical Provider, MD    Fluticasone-Salmeterol (ADVAIR) 500-50 MCG/DOSE AEPB Inhale 1 puff into the lungs 2 (two) times daily.   Yes Historical Provider, MD  gabapentin (NEURONTIN) 300 MG capsule Take 1 capsule (300 mg total) by mouth 4 (four) times daily. 01/14/16  Yes Maren Reamer, MD  hydrochlorothiazide (HYDRODIURIL) 12.5 MG tablet Take 1 tablet (12.5 mg total) by mouth daily. 01/14/16  Yes Maren Reamer, MD  ipratropium (ATROVENT) 0.02 % nebulizer solution Take 0.5 mg by nebulization 4 (four) times daily.   Yes Historical Provider, MD  losartan (COZAAR) 50 MG tablet Take 1 tablet (50 mg total) by mouth daily. 01/14/16  Yes Maren Reamer, MD  meloxicam (MOBIC) 15 MG tablet Take 15 mg by mouth daily.   Yes Historical Provider, MD  metFORMIN (GLUCOPHAGE XR) 500 MG 24 hr tablet Take 1 tablet (500 mg total) by mouth daily with breakfast. 01/15/16  Yes Maren Reamer, MD  oxyCODONE-acetaminophen (PERCOCET) 10-325 MG tablet Take 1 tablet by mouth every 6 (six) hours as needed for pain.   Yes Historical Provider, MD  simvastatin (ZOCOR) 40 MG tablet Take 1 tablet (40 mg total) by mouth daily. 01/14/16  Yes Maren Reamer, MD    Inpatient Medications:  . amLODipine  10 mg Oral Daily  . aspirin EC  81 mg Oral Daily  . azithromycin  500 mg Oral Daily   Followed by  . [START ON 01/24/2016] azithromycin  250 mg Oral Daily  . enoxaparin (LOVENOX) injection  40 mg Subcutaneous Q24H  . fluticasone  2 spray Each Nare Daily  . gabapentin  300 mg Oral QID  . guaiFENesin  600 mg Oral BID  . hydrochlorothiazide  12.5 mg Oral Daily  . insulin aspart  0-5 Units Subcutaneous QHS  . insulin aspart  0-9 Units Subcutaneous TID WC  . ipratropium-albuterol  3 mL Nebulization Q6H  . losartan  50 mg Oral Daily  . meloxicam  15 mg Oral Daily  . mometasone-formoterol  2 puff Inhalation BID  . predniSONE  40 mg Oral Q breakfast  . simvastatin  40 mg Oral q1800  . sodium chloride flush  3 mL Intravenous Q12H      Allergies:   Allergies  Allergen Reactions  . Phenergan [Promethazine Hcl] Itching    Social History   Social History  . Marital status: Married    Spouse name: N/A  . Number of children: N/A  . Years of education: N/A   Occupational History  . Not on file.   Social History Main Topics  . Smoking status: Passive Smoke Exposure - Never Smoker  . Smokeless tobacco: Never Used  . Alcohol use No  . Drug use: No  . Sexual activity: Yes   Other Topics Concern  . Not on file   Social History Narrative  . No narrative on file     Family History  Problem Relation Age of Onset  . Hypertension Mother   . Hypertension Father   . Heart Problems Brother     Heart transplant, pt does not know cause      Review of Systems: All other systems reviewed and are otherwise negative except as noted above.  Labs:  Recent Labs  01/22/16 1031 01/22/16 1726 01/22/16 2205 01/23/16 0410  TROPONINI <0.03 <0.03 0.04* <0.03   Lab Results  Component Value Date   WBC 4.7 01/21/2016   HGB 15.2 01/21/2016   HCT 45.3 01/21/2016   MCV 87.3 01/21/2016   PLT 206 01/21/2016    Recent Labs Lab 01/23/16 0410  NA 135  K 3.8  CL 98*  CO2 25  BUN 14  CREATININE 1.07  CALCIUM 9.4  GLUCOSE 172*   Radiology/Studies:  Dg Chest 2 View  Result Date: 01/21/2016 CLINICAL DATA:  Chest pain shortness of breath for 1 day. EXAM: CHEST  2 VIEW COMPARISON:  Sep 15, 2015 FINDINGS: The heart size and mediastinal contours are within normal limits. There is no focal infiltrate, pulmonary edema, or pleural effusion. The visualized skeletal structures are stable. IMPRESSION: No active cardiopulmonary disease. Electronically Signed   By: Abelardo Diesel M.D.   On: 01/21/2016 19:46    Wt Readings from Last 3 Encounters:  01/23/16 177 lb 4.8 oz (80.4 kg)  01/14/16 183 lb 3.2 oz (83.1 kg)   EKG: 01/22/16 NSR 76 TWI inferiorly and V4-V6 Inferior TW changes are new since 08/2015  Physical Exam: Blood pressure 119/73,  pulse 65, temperature 97.9 F (36.6 C), temperature source Oral, resp. rate 18, weight 177 lb 4.8 oz (80.4 kg), SpO2 95 %. There is no height or weight on file to calculate BMI. General: Well developed, well nourished AAM, in no acute distress. Head: Normocephalic, atraumatic, sclera non-icteric, no xanthomas, nares are without discharge.  Neck: Negative for carotid bruits. JVD not elevated. Lungs: Clear bilaterally to auscultation without wheezes, rales, or rhonchi. Breathing is unlabored. Heart: RRR with S1 S2. No murmurs, rubs, or gallops appreciated. Abdomen: Soft, non-tender, non-distended with normoactive bowel sounds. No hepatomegaly. No rebound/guarding. No obvious abdominal masses. Msk:  Strength and tone appear normal for age. Extremities: No clubbing or cyanosis. No edema.  Distal pedal pulses are 2+ and equal bilaterally. Neuro: Alert and oriented X 3. No facial asymmetry. No focal deficit. Moves all extremities spontaneously. Psych:  Responds to questions appropriately with a normal affect.    Assessment and Plan  74M with history of HTN, HLD, recently diagnosed DM, COPD from secondhand smoke exposure, asthma, chronic back issues s/p neck/spine surgery, chronic left knee problems who presented to [facility] with 1 week of chest pain on/off for hours, wheezing, yellow sputum cough, fever to 100.3. Troponins all neg except one that is 0.04.  1. Chest pain with suspected COPD exacerbation - IM treating with nebs, prednisone, holding off on abx. One out of 6 troponins was mildly elevated of unknown significance. EKG is abnormal with known lateral TW changes but new inferior TWI. Will review further eval with MD.   2. HTN - controlled.  3. Hyperlipidemia - maintained on statin therapy.  Signed, Charlie Pitter PA-C 01/23/2016, 8:32 AM Pager: [phone]

## 2016-01-23 NOTE — Progress Notes (Signed)
   Subjective: Continues to endorse some mild continuing chest well tenderness.  Objective:  Vital signs in last 24 hours: Vitals:   01/22/16 2029 01/22/16 2205 01/23/16 0500 01/23/16 0833  BP: 130/77  119/73   Pulse: 66  65   Resp: 18  18   Temp: 97.7 F (36.5 C) 97.5 F (36.4 C) 97.9 F (36.6 C)   TempSrc: Oral Oral Oral   SpO2: 97%  95% 95%  Weight:   177 lb 4.8 oz (80.4 kg)    Physical Exam  Constitutional: He is oriented to person, place, and time. He appears well-developed and well-nourished. No distress.  Cardiovascular: Normal rate and regular rhythm.   Pulmonary/Chest:  Normal work of breathing, good air movement, no wheezes  Neurological: He is alert and oriented to person, place, and time.  Skin: Skin is warm and dry.  Psychiatric: He has a normal mood and affect. His behavior is normal.   Cardiac Panel (last 3 results)  Recent Labs  01/22/16 1726 01/22/16 2205 01/23/16 0410  TROPONINI <0.03 0.04* <0.03     Assessment/Plan:  Principal Problem:   COPD exacerbation (HCC) Active Problems:   Chest pain   Essential hypertension   Hyperlipidemia   Secondhand smoke exposure   Diabetes mellitus with neuropathy ([place])   #Acute Bronchitis Breathing much improved, at baseline, no wheezing after steroid and few breathing treatments.  -40 mg PO prednisone for 5 days (last 9/30) -Azithromycin 5 days (last 9/31)  #Abnormal EKG Stable lateral TWI and new inferior TWI.  ACS ruled out with serial negative troponins.  His acute chest pain is likely musculoskeletal from coughing. -Echo before discharge -Outpatient Myoview and cardiology follow-up  #HTN -Continue home meds  #DM2 -SSI  Dispo: Anticipated discharge in approximately 1 day(s).   Minus Liberty, MD 01/23/2016, 11:01 AM Pager: [phone]

## 2016-01-23 NOTE — Progress Notes (Signed)
Nutrition Consult for Diet Education  RD consulted for nutrition education regarding new onset diabetes. Patient reports that he was just diagnosed with diabetes last Thursday at his doctor's appointment. He was very appreciative of RD visit to review diet recommendations for DM.  Lab Results  Component Value Date   HGBA1C 6.5 01/14/2016    RD provided "Carbohydrate Counting for People with Diabetes" handout from the Academy of Nutrition and Dietetics. Discussed different food groups and their effects on blood sugar, emphasizing carbohydrate-containing foods. Provided list of carbohydrates and recommended serving sizes of common foods.  Discussed importance of controlled and consistent carbohydrate intake throughout the day. Provided examples of ways to balance meals/snacks and encouraged intake of high-fiber, whole grain complex carbohydrates. Teach back method used.  Expect good compliance.  Current diet order is heart healthy. Labs and medications reviewed. No further nutrition interventions warranted at this time. RD contact information provided. If additional nutrition issues arise, please re-consult RD.  Recommend OP referral for further diet education.  Molli Barrows, RD, LDN, [place] Pager [phone] After Hours Pager [phone]

## 2016-01-23 NOTE — Progress Notes (Signed)
Patient not willing to walk long distances says "doesn't feel well, feels weak" but when lying in room and going short distances to bathroom patients 02 sats are 92-93%. Will continue to monitor.

## 2016-01-23 NOTE — Discharge Summary (Signed)
Name: Christian [name] MRN: [id number] DOB: 1960-07-23 55 y.o. PCP: Maren Reamer, MD  Date of Admission: 01/22/2016  1:16 AM Date of Discharge: 01/24/2016 Attending Physician: Annia Belt, MD  Discharge Diagnosis:  1. Acute Bronchitis   Discharge Medications:   Medication List    STOP taking these medications   Fluticasone-Salmeterol 500-50 MCG/DOSE Aepb Commonly known as:  ADVAIR   ipratropium 0.02 % nebulizer solution Commonly known as:  ATROVENT     TAKE these medications   albuterol 108 (90 Base) MCG/ACT inhaler Commonly known as:  PROVENTIL HFA;VENTOLIN HFA Inhale 1-2 puffs into the lungs every 6 (six) hours as needed for wheezing or shortness of breath. What changed:  Another medication with the same name was removed. Continue taking this medication, and follow the directions you see here.   amLODipine 10 MG tablet Commonly known as:  NORVASC Take 1 tablet (10 mg total) by mouth daily.   aspirin EC 81 MG tablet Take 1 tablet (81 mg total) by mouth daily.   azithromycin 250 MG tablet Commonly known as:  ZITHROMAX Take 1 tablet (250 mg total) by mouth daily.   budesonide-formoterol 160-4.5 MCG/ACT inhaler Commonly known as:  SYMBICORT Inhale 2 puffs into the lungs 2 (two) times daily.   carbamide peroxide 6.5 % otic solution Commonly known as:  DEBROX Place 5 drops into both ears 2 (two) times daily.   carisoprodol 350 MG tablet Commonly known as:  SOMA Take 1 tablet (350 mg total) by mouth 4 (four) times daily as needed for muscle spasms.   cetirizine 10 MG tablet Commonly known as:  ZYRTEC Take 10 mg by mouth daily.   cyclobenzaprine 10 MG tablet Commonly known as:  FLEXERIL Take 10 mg by mouth 3 (three) times daily as needed for muscle spasms.   diclofenac 75 MG EC tablet Commonly known as:  VOLTAREN Take 75 mg by mouth 2 (two) times daily.   gabapentin 300 MG capsule Commonly known as:  NEURONTIN Take 1 capsule (300 mg total) by mouth  4 (four) times daily.   hydrochlorothiazide 12.5 MG tablet Commonly known as:  HYDRODIURIL Take 1 tablet (12.5 mg total) by mouth daily.   ipratropium-albuterol 0.5-2.5 (3) MG/3ML Soln Commonly known as:  DUONEB Take 3 mLs by nebulization every 4 (four) hours as needed.   losartan 50 MG tablet Commonly known as:  COZAAR Take 1 tablet (50 mg total) by mouth daily.   meloxicam 15 MG tablet Commonly known as:  MOBIC Take 15 mg by mouth daily.   metFORMIN 500 MG 24 hr tablet Commonly known as:  GLUCOPHAGE XR Take 1 tablet (500 mg total) by mouth daily with breakfast.   oxyCODONE-acetaminophen 10-325 MG tablet Commonly known as:  PERCOCET Take 1 tablet by mouth every 6 (six) hours as needed for pain.   predniSONE 20 MG tablet Commonly known as:  DELTASONE Take 2 tablets (40 mg total) by mouth daily with breakfast.   simvastatin 40 MG tablet Commonly known as:  ZOCOR Take 1 tablet (40 mg total) by mouth daily.       Disposition and follow-up:   Christian.Christian [name] was discharged from [facility] in Good condition.  At the hospital follow up visit please address:  1.  Asthma.  No childhood history, no PFTs in EHR, unclear how diagnosis was established.  Consider obtaining records or new PFTs to corroborate reversible obstructive disease.  Reinforce use of controller vs rescue medications.  Ensure he has refilled  Symbicort.  2.  Diabetes.  Diagnosed last week.  Diabetic teaching and glycemic control.  3.  Labs / imaging needed at time of follow-up: PFTs; verify myocardial perfusion imaging study has been scheduled for before cardiology appointment  4.  Pending labs/ test needing follow-up: none  Follow-up Appointments: Follow-up Information    Maren Reamer, MD. Call in 5 day(s).   Specialty:  Internal Medicine Why:  Call [facility] on Monday October 2nd to schedule a follow-up for that same week. Contact information: [place] [address] [phone]        Almyra Deforest, [place] .   Specialties:  Cardiology, Radiology Why:  You will follow up with Almyra Deforest on 10/6 at 8am at [facility] office. Isaac Laud is one of the PAs that works with Dr. Debara Pickett. Contact information: [address] [place] [address] [phone]           Hospital Course by problem list: Principal Problem:   Asthma with acute exacerbation Active Problems:   Musculoskeletal chest pain   Essential hypertension   Hyperlipidemia   Secondhand smoke exposure   Diabetes mellitus with neuropathy (HCC)   Acute bronchitis   1. Acute Bronchitis Christian [name] presented with 3 days of atypical chest pain, shortness of breath, wheezing, and productive cough.  He denies childhood asthma and has never smoked, but said he was diagnosed with asthma as an adult.  He has not had Advair or Symbicort recently, and uses his albuterol inhaler and nebulizer regularly.  From presentation to discharge he saturated well on room air,  did not have any wheezing, and was afebrile.  He was admitted for chest pain rule out, but was treated for acute bronchitis with nebulizers and 5 days of prednisone and azithromycin to complete as outpatient when no cardiac etiology was found.  He was discharged with instructions to refill his prior Symbicort prescription, to use his controller daily, and to use albuterol as a rescue medication.  2. Chest Pain He reported diffuse chest and back tenderness exacerbated by coughing and reproducible with palpation, most likely muscular due to coughing.  EKG was abnormal with inferolateral T-wave inversions, but largely consistent with prior.  Serial troponins negative to rule out ACS.  Evaluated by cardiology.  Will obtain outpatient myocardial perfusion imaging study and follow-up with cardiology.   Discharge Vitals:   BP 120/78 (BP Location: Left Arm)   Pulse 72   Temp 98.5 F (36.9 C) (Oral)   Resp 16    Ht 5\' 7"  (1.702 m)   Wt 177 lb 3.2 oz (80.4 kg)   SpO2 98%   BMI 27.75 kg/m   Pertinent Labs, Studies, and Procedures:   EKG (01/22/16): NSR, TWI in inferolateral leads somewhat more pronounced than prior.    Cardiac Panel (last 3 results)  Recent Labs  01/22/16 1726 01/22/16 2205 01/23/16 0410  TROPONINI <0.03 0.04* <0.03   Chest Radiographs (01/21/16): No active cardiopulmonary disease.  Echocardiogram (01/23/16): EF [id number], grade 1 diastolic dysfunction  Discharge Instructions: Discharge Instructions    Diet - low sodium heart healthy    Complete by:  As directed    Increase activity slowly    Complete by:  As directed       You were admitted to the hospital with bronchitis, or inflammation in your airways.  You were treated with steroids, breathing treatments, and antibiotics, and will continue with steroids and antibiotics for a few more  days.  You stopped wheezing and were getting good oxygen even when walking.    Please look at your discharge medication list, since changes have been made to your medicines.   Use the Symbicort inhaler (2 puffs twice daily) regardless of how your breathing is doing in order to prevent asthma attacks.  Use the albuterol inhaler or nebulizer only if your breathing is getting worse, you have chest tightness, or are wheezing.  Because you were having chest soreness, we also made sure that your chest pain was not coming from your heart.  You did NOT have a heart attack, but the electrical test of your heart is also not normal.  We would like you to come back for another imaging study of your heart (myocardial perfusion imaging) and also follow-up with a cardiologist.  If you develop new shortness of breath or chest pain, please come back to the ED.  Signed: Minus Liberty, MD 01/24/2016, 11:53 AM   Pager: [phone]

## 2016-01-23 NOTE — Progress Notes (Signed)
  Echocardiogram 2D Echocardiogram has been performed.  Darlina Sicilian M 01/23/2016, 2:15 PM

## 2016-01-24 ENCOUNTER — Telehealth: Payer: Self-pay | Admitting: Internal Medicine

## 2016-01-24 ENCOUNTER — Other Ambulatory Visit: Payer: Self-pay

## 2016-01-24 DIAGNOSIS — R0789 Other chest pain: Secondary | ICD-10-CM

## 2016-01-24 DIAGNOSIS — I10 Essential (primary) hypertension: Secondary | ICD-10-CM | POA: Diagnosis not present

## 2016-01-24 DIAGNOSIS — E785 Hyperlipidemia, unspecified: Secondary | ICD-10-CM | POA: Diagnosis not present

## 2016-01-24 DIAGNOSIS — J45901 Unspecified asthma with (acute) exacerbation: Secondary | ICD-10-CM

## 2016-01-24 DIAGNOSIS — J441 Chronic obstructive pulmonary disease with (acute) exacerbation: Secondary | ICD-10-CM | POA: Diagnosis not present

## 2016-01-24 LAB — GLUCOSE, CAPILLARY
GLUCOSE-CAPILLARY: 123 mg/dL — AB (ref 65–99)
Glucose-Capillary: 99 mg/dL (ref 65–99)

## 2016-01-24 MED ORDER — TRUEPLUS LANCETS 28G MISC: 3 refills | Status: DC

## 2016-01-24 MED ORDER — BUDESONIDE-FORMOTEROL FUMARATE 160-4.5 MCG/ACT IN AERO: 2.0000 | INHALATION_SPRAY | Freq: Two times a day (BID) | RESPIRATORY_TRACT | 3 refills | Status: DC

## 2016-01-24 MED ORDER — AZITHROMYCIN 250 MG PO TABS: 250.0000 mg | ORAL_TABLET | Freq: Every day | ORAL | 0 refills | Status: AC

## 2016-01-24 MED ORDER — LIVING WELL WITH DIABETES BOOK
Freq: Once | Status: AC
  Administered 2016-01-24: 12:00:00
  Filled 2016-01-24 (×2): qty 1

## 2016-01-24 MED ORDER — IPRATROPIUM-ALBUTEROL 0.5-2.5 (3) MG/3ML IN SOLN: 3.0000 mL | RESPIRATORY_TRACT | 2 refills | Status: DC | PRN

## 2016-01-24 MED ORDER — TRUE METRIX METER W/DEVICE KIT: PACK | 0 refills | Status: DC

## 2016-01-24 MED ORDER — PREDNISONE 20 MG PO TABS: 40.0000 mg | ORAL_TABLET | Freq: Every day | ORAL | 0 refills | Status: AC

## 2016-01-24 MED ORDER — GLUCOSE BLOOD VI STRP: ORAL_STRIP | 12 refills | Status: DC

## 2016-01-24 MED FILL — SYMBICORT 160-4.5 MCG INH: 160-4.5 | 30 days supply | Qty: 10 | Fill #0

## 2016-01-24 MED FILL — AZITHROMYCIN 250 MG TABLET: 250 | 3 days supply | Qty: 3 | Fill #0

## 2016-01-24 MED FILL — predniSONE 20 MG TABS: 20 | 2 days supply | Qty: 4 | Fill #0

## 2016-01-24 NOTE — Progress Notes (Signed)
DAILY PROGRESS NOTE  Subjective:  Feels better today. Echo yesterday shows normal LVEF of 65-70% with mild diastolic dysfunction, elevated filling pressure - does not completely explain dyspnea/hypoxia, more likely due to COPD/bronchitis. He is net negative with fluids since admission.  Objective:  Temp:  [97.9 F (36.6 C)-98.4 F (36.9 C)] 97.9 F (36.6 C) (09/28 0500) Pulse Rate:  [62-72] 72 (09/28 0500) Resp:  [18-20] 18 (09/28 0500) BP: (115-132)/(78-81) 126/81 (09/28 0500) SpO2:  [93 %-98 %] 95 % (09/28 0500) Weight:  [177 lb 3.2 oz (80.4 kg)] 177 lb 3.2 oz (80.4 kg) (09/28 0500) Weight change: -1.6 oz (-0.045 kg)  Intake/Output from previous day: 09/27 0701 - 09/28 0700 In: 720 [P.O.:720] Out: 1200 [Urine:1200]  Intake/Output from this shift: Total I/O In: -  Out: 500 [Urine:500]  Medications: No current facility-administered medications on file prior to encounter.    Current Outpatient Prescriptions on File Prior to Encounter  Medication Sig Dispense Refill  . albuterol (PROVENTIL HFA;VENTOLIN HFA) 108 (90 Base) MCG/ACT inhaler Inhale 1-2 puffs into the lungs every 6 (six) hours as needed for wheezing or shortness of breath. 1 Inhaler 11  . albuterol (PROVENTIL) (2.5 MG/3ML) 0.083% nebulizer solution Take 2.5 mg by nebulization every 2 (two) hours as needed for wheezing or shortness of breath.    Marland Kitchen amLODipine (NORVASC) 10 MG tablet Take 1 tablet (10 mg total) by mouth daily. 90 tablet 2  . aspirin EC 81 MG tablet Take 1 tablet (81 mg total) by mouth daily. 90 tablet 2  . budesonide-formoterol (SYMBICORT) 160-4.5 MCG/ACT inhaler Inhale 2 puffs into the lungs 2 (two) times daily. 1 Inhaler 3  . carbamide peroxide (DEBROX) 6.5 % otic solution Place 5 drops into both ears 2 (two) times daily. 15 mL 0  . carisoprodol (SOMA) 350 MG tablet Take 1 tablet (350 mg total) by mouth 4 (four) times daily as needed for muscle spasms. 50 tablet 0  . cetirizine (ZYRTEC) 10 MG  tablet Take 10 mg by mouth daily.    . cyclobenzaprine (FLEXERIL) 10 MG tablet Take 10 mg by mouth 3 (three) times daily as needed for muscle spasms.    . diclofenac (VOLTAREN) 75 MG EC tablet Take 75 mg by mouth 2 (two) times daily.    . Fluticasone-Salmeterol (ADVAIR) 500-50 MCG/DOSE AEPB Inhale 1 puff into the lungs 2 (two) times daily.    Marland Kitchen gabapentin (NEURONTIN) 300 MG capsule Take 1 capsule (300 mg total) by mouth 4 (four) times daily. 120 capsule 1  . hydrochlorothiazide (HYDRODIURIL) 12.5 MG tablet Take 1 tablet (12.5 mg total) by mouth daily. 90 tablet 2  . ipratropium (ATROVENT) 0.02 % nebulizer solution Take 0.5 mg by nebulization 4 (four) times daily.    Marland Kitchen losartan (COZAAR) 50 MG tablet Take 1 tablet (50 mg total) by mouth daily. 90 tablet 2  . meloxicam (MOBIC) 15 MG tablet Take 15 mg by mouth daily.    . metFORMIN (GLUCOPHAGE XR) 500 MG 24 hr tablet Take 1 tablet (500 mg total) by mouth daily with breakfast. 90 tablet 3  . oxyCODONE-acetaminophen (PERCOCET) 10-325 MG tablet Take 1 tablet by mouth every 6 (six) hours as needed for pain.    . simvastatin (ZOCOR) 40 MG tablet Take 1 tablet (40 mg total) by mouth daily. 90 tablet 2    Physical Exam: General appearance: alert and no distress Lungs: clear to auscultation bilaterally Heart: regular rate and rhythm Extremities: extremities normal, atraumatic, no cyanosis or edema Neurologic:  Grossly normal  Lab Results: Results for orders placed or performed during the hospital encounter of 01/22/16 (from the past 48 hour(s))  Troponin I (q 6hr x 3)     Status: None   Collection Time: 01/22/16 10:31 AM  Result Value Ref Range   Troponin I <0.03 <0.03 ng/mL  HIV antibody     Status: None   Collection Time: 01/22/16 11:14 AM  Result Value Ref Range   HIV Screen 4th Generation wRfx Non Reactive Non Reactive    Comment: (NOTE) Performed At: [facility] [place], [place] [id number] Lindon Romp MD  XB:[id number]   CBG monitoring, ED     Status: Abnormal   Collection Time: 01/22/16 12:08 PM  Result Value Ref Range   Glucose-Capillary 101 (H) 65 - 99 mg/dL  Glucose, capillary     Status: Abnormal   Collection Time: 01/22/16  5:24 PM  Result Value Ref Range   Glucose-Capillary 104 (H) 65 - 99 mg/dL  Troponin I (q 6hr x 3)     Status: None   Collection Time: 01/22/16  5:26 PM  Result Value Ref Range   Troponin I <0.03 <0.03 ng/mL  Glucose, capillary     Status: Abnormal   Collection Time: 01/22/16  8:57 PM  Result Value Ref Range   Glucose-Capillary 123 (H) 65 - 99 mg/dL  Troponin I (q 6hr x 3)     Status: Abnormal   Collection Time: 01/22/16 10:05 PM  Result Value Ref Range   Troponin I 0.04 (HH) <0.03 ng/mL    Comment: CRITICAL RESULT CALLED TO, READ BACK BY AND VERIFIED WITH: K PHILLIPS,RN 2255 01/22/2016 WBOND   Basic metabolic panel     Status: Abnormal   Collection Time: 01/23/16  4:10 AM  Result Value Ref Range   Sodium 135 135 - 145 mmol/L   Potassium 3.8 3.5 - 5.1 mmol/L   Chloride 98 (L) 101 - 111 mmol/L   CO2 25 22 - 32 mmol/L   Glucose, Bld 172 (H) 65 - 99 mg/dL   BUN 14 6 - 20 mg/dL   Creatinine, Ser 1.07 0.61 - 1.24 mg/dL   Calcium 9.4 8.9 - 10.3 mg/dL   GFR calc non Af Amer >60 >60 mL/min   GFR calc Af Amer >60 >60 mL/min    Comment: (NOTE) The eGFR has been calculated using the CKD EPI equation. This calculation has not been validated in all clinical situations. eGFR's persistently <60 mL/min signify possible Chronic Kidney Disease.    Anion gap 12 5 - 15  Troponin I     Status: None   Collection Time: 01/23/16  4:10 AM  Result Value Ref Range   Troponin I <0.03 <0.03 ng/mL  Glucose, capillary     Status: Abnormal   Collection Time: 01/23/16  7:20 AM  Result Value Ref Range   Glucose-Capillary 124 (H) 65 - 99 mg/dL  Glucose, capillary     Status: Abnormal   Collection Time: 01/23/16 11:04 AM  Result Value Ref Range   Glucose-Capillary 193 (H)  65 - 99 mg/dL  Glucose, capillary     Status: Abnormal   Collection Time: 01/23/16  4:04 PM  Result Value Ref Range   Glucose-Capillary 212 (H) 65 - 99 mg/dL  Glucose, capillary     Status: Abnormal   Collection Time: 01/23/16  9:17 PM  Result Value Ref Range   Glucose-Capillary 130 (H) 65 - 99 mg/dL  Glucose, capillary  Status: None   Collection Time: 01/24/16  8:01 AM  Result Value Ref Range   Glucose-Capillary 99 65 - 99 mg/dL    Imaging: No results found.  Assessment:  1. Principal Problem: 2.   COPD exacerbation ([employer]) 3. Active Problems: 4.   Musculoskeletal chest pain 5.   Essential hypertension 6.   Hyperlipidemia 7.   Secondhand smoke exposure 8.   Diabetes mellitus with neuropathy ([place]) 9.   Acute bronchitis 10.   Plan:  1. Improving COPD exacerbation - echo with normal systolic function and mild diastolic dysfunction, auto-diuresing. Negative troponins. Will arrange for outpatient follow-up and stress testing once his lungs have recovered.  Cardiology will sign-off. Please call with questions.  Time Spent Directly with Patient:  15 minutes  Length of Stay:  LOS: 0 days   Pixie Casino, MD, [facility] Attending Cardiologist [place] 01/24/2016, 8:13 AM

## 2016-01-24 NOTE — Progress Notes (Signed)
F/u scheduled on 02/01/16 at 8am with Almyra Deforest PA-C, appt placed in fu section. At that visit will reassess timing of stress test if wheezing improved. Dayna Dunn PA-C

## 2016-01-24 NOTE — Progress Notes (Signed)
   Subjective: Feels back to baseline today, ready to go home.  No wheezing, chest tightness, dyspnea.  Objective:  Vital signs in last 24 hours: Vitals:   01/24/16 0855 01/24/16 0856 01/24/16 0900 01/24/16 1000  BP:   120/78   Pulse:   72   Resp:   16   Temp:   98.5 F (36.9 C)   TempSrc:   Oral   SpO2: 97% 97% 98%   Weight:      Height:    5\' 7"  (1.702 m)   Physical Exam  Constitutional: He is oriented to person, place, and time. He appears well-developed and well-nourished. No distress.  Cardiovascular: Normal rate and regular rhythm.   Pulmonary/Chest:  Normal work of breathing, good air movement, no wheezes  Neurological: He is alert and oriented to person, place, and time.  Skin: Skin is warm and dry.  Psychiatric: He has a normal mood and affect. His behavior is normal.   Cardiac Panel (last 3 results)   Assessment/Plan:  Principal Problem:   Asthma with acute exacerbation Active Problems:   Musculoskeletal chest pain   Essential hypertension   Hyperlipidemia   Secondhand smoke exposure   Diabetes mellitus with neuropathy (H[facility])   Acute bronchitis   #Acute Bronchitis Breathing much improved, at baseline, no wheezing after steroid and few breathing treatments.  -40 mg PO prednisone for 5 days (last 9/30) -Azithromycin 5 days (last 9/31)  #Abnormal EKG Stable lateral TWI and new inferior TWI.  ACS ruled out with serial negative troponins.  His acute chest pain is likely musculoskeletal from coughing. -Echo before discharge -Outpatient Myoview and cardiology follow-up  #HTN -Continue home meds  #DM2 -SSI  Dispo: Anticipated discharge today.  Minus Liberty, MD 01/24/2016, 11:54 AM Pager: [phone]

## 2016-01-24 NOTE — Telephone Encounter (Signed)
Patient called the office to speak with nurse regarding his status.Pt was discharged from the hospital today and stated that needs to speak to PCP regarding a meter and his meds. Please follow up.   Thank you.

## 2016-01-24 NOTE — Progress Notes (Signed)
Patient to be discharged, spoke with patient about needs, he says he is already set up at clinic and will use a cab for transportation. Reports no needs for going home and will go and pick medications. Dietician and diabetes coordinator have done education and given materials for new diabetes.

## 2016-01-24 NOTE — Discharge Instructions (Signed)
You were admitted to the hospital with bronchitis, or inflammation in your airways.  You were treated with steroids, breathing treatments, and antibiotics, and will continue with steroids and antibiotics for a few more days.  You stopped wheezing and were getting good oxygen even when walking.    Please look at your discharge medication list, since changes have been made to your medicines.   Use the Symbicort inhaler (2 puffs twice daily) regardless of how your breathing is doing in order to prevent asthma attacks.  Use the albuterol inhaler or nebulizer only if your breathing is getting worse, you have chest tightness, or are wheezing.  Because you were having chest soreness, we also made sure that your chest pain was not coming from your heart.  You did NOT have a heart attack, but the electrical test of your heart is also not normal.  We would like you to come back for another imaging study of your heart (myocardial perfusion imaging) and also follow-up with a cardiologist.  If you develop new shortness of breath or chest pain, please come back to the ED.    Acute Bronchitis Bronchitis is inflammation of the airways that extend from the windpipe into the lungs (bronchi). The inflammation often causes mucus to develop. This leads to a cough, which is the most common symptom of bronchitis.  In acute bronchitis, the condition usually develops suddenly and goes away over time, usually in a couple weeks. Smoking, allergies, and asthma can make bronchitis worse. Repeated episodes of bronchitis may cause further lung problems.  CAUSES Acute bronchitis is most often caused by the same virus that causes a cold. The virus can spread from person to person (contagious) through coughing, sneezing, and touching contaminated objects. SIGNS AND SYMPTOMS   Cough.   Fever.   Coughing up mucus.   Body aches.   Chest congestion.   Chills.   Shortness of breath.   Sore throat.  DIAGNOSIS    Acute bronchitis is usually diagnosed through a physical exam. Your health care provider will also ask you questions about your medical history. Tests, such as chest X-rays, are sometimes done to rule out other conditions.  TREATMENT  Acute bronchitis usually goes away in a couple weeks. Oftentimes, no medical treatment is necessary. Medicines are sometimes given for relief of fever or cough. Antibiotic medicines are usually not needed but may be prescribed in certain situations. In some cases, an inhaler may be recommended to help reduce shortness of breath and control the cough. A cool mist vaporizer may also be used to help thin bronchial secretions and make it easier to clear the chest.  HOME CARE INSTRUCTIONS  Get plenty of rest.   Drink enough fluids to keep your urine clear or pale yellow (unless you have a medical condition that requires fluid restriction). Increasing fluids may help thin your respiratory secretions (sputum) and reduce chest congestion, and it will prevent dehydration.   Take medicines only as directed by your health care provider.  If you were prescribed an antibiotic medicine, finish it all even if you start to feel better.  Avoid smoking and secondhand smoke. Exposure to cigarette smoke or irritating chemicals will make bronchitis worse. If you are a smoker, consider using nicotine gum or skin patches to help control withdrawal symptoms. Quitting smoking will help your lungs heal faster.   Reduce the chances of another bout of acute bronchitis by washing your hands frequently, avoiding people with cold symptoms, and trying not to touch  your hands to your mouth, nose, or eyes.   Keep all follow-up visits as directed by your health care provider.  SEEK MEDICAL CARE IF: Your symptoms do not improve after 1 week of treatment.  SEEK IMMEDIATE MEDICAL CARE IF:  You develop an increased fever or chills.   You have chest pain.   You have severe shortness of  breath.  You have bloody sputum.   You develop dehydration.  You faint or repeatedly feel like you are going to pass out.  You develop repeated vomiting.  You develop a severe headache. MAKE SURE YOU:   Understand these instructions.  Will watch your condition.  Will get help right away if you are not doing well or get worse.   This information is not intended to replace advice given to you by your health care provider. Make sure you discuss any questions you have with your health care provider.   Document Released: 05/22/2004 Document Revised: 05/05/2014 Document Reviewed: 10/05/2012 Elsevier Interactive Patient Education [employer].

## 2016-01-24 NOTE — Progress Notes (Signed)
Inpatient Diabetes Program Recommendations  AACE/ADA: New Consensus Statement on Inpatient Glycemic Control (2015)  Target Ranges:  Prepandial:   less than 140 mg/dL      Peak postprandial:   less than 180 mg/dL (1-2 hours)      Critically ill patients:  140 - 180 mg/dL   Lab Results  Component Value Date   GLUCAP 123 (H) 01/24/2016   HGBA1C 6.5 01/14/2016    Review of Glycemic Control  Diabetes history: newly diagnosed DM Outpatient Diabetes medications: metformin 500 mg QD Current orders for Inpatient glycemic control: Novolog sensitive tidwc and hs  Inpatient Diabetes Program Recommendations:    Increase metformin to 500 mg bid Living Well With Diabetes book  Discuss new diagnosis of DM with pt. Discussed HgbA1C of 6.5%. Discussed how diet, exercise and stress management affect blood sugars. To f/u at [facility] for diabetes management.  Thank you. Lorenda Peck, RD, LDN, CDE Inpatient Diabetes Coordinator [phone]

## 2016-01-25 ENCOUNTER — Other Ambulatory Visit: Payer: Self-pay

## 2016-01-25 MED ORDER — GLUCOSE BLOOD VI STRP: ORAL_STRIP | 12 refills | Status: DC

## 2016-01-25 MED ORDER — ONETOUCH ULTRASOFT LANCETS MISC: 12 refills | Status: DC

## 2016-01-25 MED ORDER — ONETOUCH ULTRA SYSTEM W/DEVICE KIT: 1.0000 | PACK | Freq: Once | 0 refills | Status: AC

## 2016-01-28 NOTE — Telephone Encounter (Signed)
Printed RX for diabetic supplies.

## 2016-02-01 ENCOUNTER — Ambulatory Visit: Payer: Medicare Other | Admitting: Physician Assistant

## 2016-02-01 ENCOUNTER — Telehealth: Payer: Self-pay | Admitting: Internal Medicine

## 2016-02-01 MED ORDER — GLUCOSE BLOOD VI STRP: ORAL_STRIP | 12 refills | Status: DC

## 2016-02-01 MED ORDER — TRUE METRIX METER W/DEVICE KIT: PACK | 0 refills | Status: DC

## 2016-02-01 MED ORDER — TRUEPLUS LANCETS 28G MISC: 3 refills | Status: DC

## 2016-02-01 NOTE — Telephone Encounter (Signed)
Supplies sent to [facility] pharmacy

## 2016-02-01 NOTE — Telephone Encounter (Signed)
Patient is requesting a prescription for a meter to read his sugar levels.... Please follow up with patient

## 2016-02-04 ENCOUNTER — Ambulatory Visit: Payer: Medicare Other | Admitting: Physician Assistant

## 2016-02-05 DIAGNOSIS — Z23 Encounter for immunization: Secondary | ICD-10-CM | POA: Diagnosis not present

## 2016-02-07 ENCOUNTER — Other Ambulatory Visit: Payer: Self-pay | Admitting: Pharmacist

## 2016-02-07 NOTE — Telephone Encounter (Signed)
Called patient to clarify what pharmacy he would like his blood glucose meter and supplies sent to. He said that he did not know the name but had the number. He provided me with the number but there was no associated pharmacy with that number and appears to possibly be a spam call.  I tried to have patient pick another pharmacy but he wants to wait until he can talk to the pharmacy that contacted him.  Asked that he call us tomorrow with another pharmacy if he is unable to determine which pharmacy he is trying to contact.

## 2016-02-07 NOTE — Telephone Encounter (Signed)
Phamacy called pt stating they can not fill the meter and script Rx due to pts insurance  Instructed that Rx needs to be sent over to [facility]   Pt would like to be called when Rx has been sent over

## 2016-02-13 ENCOUNTER — Ambulatory Visit: Payer: Medicare Other | Admitting: Physician Assistant

## 2016-02-18 ENCOUNTER — Ambulatory Visit (HOSPITAL_BASED_OUTPATIENT_CLINIC_OR_DEPARTMENT_OTHER): Payer: Medicare Other | Admitting: Internal Medicine

## 2016-02-18 DIAGNOSIS — R079 Chest pain, unspecified: Secondary | ICD-10-CM

## 2016-02-19 NOTE — Progress Notes (Signed)
Pt canceled appt. Will put in referral for cards.

## 2016-02-27 ENCOUNTER — Encounter: Payer: Self-pay | Admitting: Physician Assistant

## 2016-02-27 ENCOUNTER — Ambulatory Visit (INDEPENDENT_AMBULATORY_CARE_PROVIDER_SITE_OTHER): Payer: Medicare Other | Admitting: Physician Assistant

## 2016-02-27 VITALS — BP 122/80 | HR 81 | Ht 70.0 in | Wt 173.4 lb

## 2016-02-27 DIAGNOSIS — J449 Chronic obstructive pulmonary disease, unspecified: Secondary | ICD-10-CM | POA: Diagnosis not present

## 2016-02-27 DIAGNOSIS — E118 Type 2 diabetes mellitus with unspecified complications: Secondary | ICD-10-CM

## 2016-02-27 DIAGNOSIS — R079 Chest pain, unspecified: Secondary | ICD-10-CM | POA: Diagnosis not present

## 2016-02-27 DIAGNOSIS — E785 Hyperlipidemia, unspecified: Secondary | ICD-10-CM

## 2016-02-27 DIAGNOSIS — G4733 Obstructive sleep apnea (adult) (pediatric): Secondary | ICD-10-CM

## 2016-02-27 DIAGNOSIS — I10 Essential (primary) hypertension: Secondary | ICD-10-CM

## 2016-02-27 DIAGNOSIS — Z9989 Dependence on other enabling machines and devices: Secondary | ICD-10-CM

## 2016-02-27 NOTE — Progress Notes (Signed)
Cardiology Office Note    Date:  02/27/2016   ID:  Christian [name], DOB [birth year], MRN [id number]  PCP:  Maren Reamer, MD  Cardiologist:  Dr. Debara Pickett  Chief Complaint  Patient presents with  . Hospitalization Follow-up    seen for Dr. Debara Pickett, recently evaluated in the hospital for chest pain    History of Present Illness:  Christian [name] is a 55 y.o. male with PMH of COPD from 2nd hand smoke, HLD, HTN, Asthma, OSA on CPAP, and type II DM (diagnosed in Sept 2017). He was recently evaluated for chest pain on 01/23/2016. Prior to that, he had cold-like symptoms, followed by intermittent chest discomfort. It was felt patient had COPD/asthma exacerbation. Serial troponin were all negative 3 except for a single 0.04 value. Echocardiogram obtained on 01/23/2016 showed EF 65-70%, mild LVH, grade 1 diastolic dysfunction. Given reassuring echo result, we planned outpatient follow-up and stress testing when his respiratory symptom has resolved.  Hypertension presents today for post hospital follow-up, his chest discomfort is largely resolved. He however started developing bilateral shoulder discomfort and also right flank pain as well. He only last a few minutes at a time. He is somewhat concerned given the fact that his brother had a heart transplant when he was in this early 24s. Otherwise, his symptom does not seems to be related to exertion. He is asthma exacerbation seems to have also resolved, air movement in the lung quite well. He is no longer short of breath, he still cough up a lot of white phlegm but it is not interfering with his breathing. We will pursue outpatient treadmill stress test. Since the patient moved up to [facility] area month ago, he has not formally established with a PCP. And that he does not remember when was the last time he had his cholesterol checked. He did eat a piece of toast this morning. We will obtain a fasting lipid panel and LFTs when he come back for a stress test. If the  stress test is negative, then I think annual follow-up would be more reasonable. Again his symptom is very atypical.   Past Medical History:  Diagnosis Date  . Arthritis    "left knee" (01/22/2016)  . Asthma   . Chronic lower back pain   . COPD (chronic obstructive pulmonary disease) ([place])   . High cholesterol   . Hypertension   . Neuropathy ([place])   . OSA on CPAP   . Secondhand smoke exposure   . Type II diabetes mellitus ([place]) dx'd 01/17/2016    Past Surgical History:  Procedure Laterality Date  . ANTERIOR CERVICAL DECOMP/DISCECTOMY FUSION  ~ 1995   "titanium"  . APPENDECTOMY  ~ 1977  . BACK SURGERY    . INGUINAL HERNIA REPAIR Right ~ 2000  . LUMBAR DISC SURGERY  ~ 2000; ~2006  . NASAL SINUS SURGERY  ~ 2007    Current Medications: Outpatient Medications Prior to Visit  Medication Sig Dispense Refill  . albuterol (PROVENTIL HFA;VENTOLIN HFA) 108 (90 Base) MCG/ACT inhaler Inhale 1-2 puffs into the lungs every 6 (six) hours as needed for wheezing or shortness of breath. 1 Inhaler 11  . amLODipine (NORVASC) 10 MG tablet Take 1 tablet (10 mg total) by mouth daily. 90 tablet 2  . aspirin EC 81 MG tablet Take 1 tablet (81 mg total) by mouth daily. 90 tablet 2  . Blood Glucose Monitoring Suppl (TRUE METRIX METER) w/Device KIT Check glucose 2 times a day 1 kit 0  .  budesonide-formoterol (SYMBICORT) 160-4.5 MCG/ACT inhaler Inhale 2 puffs into the lungs 2 (two) times daily. 1 Inhaler 3  . carisoprodol (SOMA) 350 MG tablet Take 1 tablet (350 mg total) by mouth 4 (four) times daily as needed for muscle spasms. 50 tablet 0  . gabapentin (NEURONTIN) 300 MG capsule Take 1 capsule (300 mg total) by mouth 4 (four) times daily. 120 capsule 1  . glucose blood (TRUE METRIX BLOOD GLUCOSE TEST) test strip Use as instructed 100 each 12  . hydrochlorothiazide (HYDRODIURIL) 12.5 MG tablet Take 1 tablet (12.5 mg total) by mouth daily. 90 tablet 2  . ipratropium-albuterol (DUONEB) 0.5-2.5 (3) MG/3ML SOLN  Take 3 mLs by nebulization every 4 (four) hours as needed. 360 mL 2  . losartan (COZAAR) 50 MG tablet Take 1 tablet (50 mg total) by mouth daily. 90 tablet 2  . metFORMIN (GLUCOPHAGE XR) 500 MG 24 hr tablet Take 1 tablet (500 mg total) by mouth daily with breakfast. 90 tablet 3  . simvastatin (ZOCOR) 40 MG tablet Take 1 tablet (40 mg total) by mouth daily. 90 tablet 2  . TRUEPLUS LANCETS 28G MISC Check glucose 2 times a day 100 each 3  . carbamide peroxide (DEBROX) 6.5 % otic solution Place 5 drops into both ears 2 (two) times daily. 15 mL 0  . cetirizine (ZYRTEC) 10 MG tablet Take 10 mg by mouth daily.    . cyclobenzaprine (FLEXERIL) 10 MG tablet Take 10 mg by mouth 3 (three) times daily as needed for muscle spasms.    . diclofenac (VOLTAREN) 75 MG EC tablet Take 75 mg by mouth 2 (two) times daily.    . meloxicam (MOBIC) 15 MG tablet Take 15 mg by mouth daily.    Marland Kitchen oxyCODONE-acetaminophen (PERCOCET) 10-325 MG tablet Take 1 tablet by mouth every 6 (six) hours as needed for pain.     No facility-administered medications prior to visit.      Allergies:   Phenergan [promethazine hcl]   Social History   Social History  . Marital status: Married    Spouse name: N/A  . Number of children: N/A  . Years of education: N/A   Social History Main Topics  . Smoking status: Passive Smoke Exposure - Never Smoker  . Smokeless tobacco: Never Used  . Alcohol use No  . Drug use: No  . Sexual activity: Yes   Other Topics Concern  . None   Social History Narrative  . None     Family History:  The patient's family history includes Heart Problems in his brother; Hypertension in his father and mother.   ROS:   Please see the history of present illness.    ROS All other systems reviewed and are negative.   PHYSICAL EXAM:   VS:  BP 122/80   Pulse 81   Ht 5' 10" (1.778 m)   Wt 173 lb 6.4 oz (78.7 kg)   BMI 24.88 kg/m    GEN: Well nourished, well developed, in no acute distress  HEENT:  normal  Neck: no JVD, carotid bruits, or masses Cardiac: RRR; no murmurs, rubs, or gallops,no edema  Respiratory:  clear to auscultation bilaterally, normal work of breathing GI: soft, nontender, nondistended, + BS MS: no deformity or atrophy  Skin: warm and dry, no rash Neuro:  Alert and Oriented x 3, Strength and sensation are intact Psych: euthymic mood, full affect  Wt Readings from Last 3 Encounters:  02/27/16 173 lb 6.4 oz (78.7 kg)  01/24/16 177 lb  3.2 oz (80.4 kg)  01/14/16 183 lb 3.2 oz (83.1 kg)      Studies/Labs Reviewed:   EKG:  EKG is ordered today.  The EKG shows sinus rhythm with T-wave inversion in the lateral and inferior lead  Recent Labs: 01/21/2016: Hemoglobin 15.2; Platelets 206 01/23/2016: BUN 14; Creatinine, Ser 1.07; Potassium 3.8; Sodium 135   Lipid Panel No results found for: CHOL, TRIG, HDL, CHOLHDL, VLDL, LDLCALC, LDLDIRECT  Additional studies/ records that were reviewed today include:   Echo 01/23/2016 LV EF: 65% -   70% ------------------------------------------------------------------- Study Conclusions  - Left ventricle: The cavity size was normal. There was mild   concentric hypertrophy. Systolic function was vigorous. The   estimated ejection fraction was in the range of 65% to 70%. Wall   motion was normal; there were no regional wall motion   abnormalities. Doppler parameters are consistent with abnormal   left ventricular relaxation (grade 1 diastolic dysfunction).   Doppler parameters are consistent with elevated ventricular   end-diastolic filling pressure. - Aortic valve: There was no regurgitation. - Aortic root: The aortic root was normal in size. - Mitral valve: There was trivial regurgitation. - Left atrium: The atrium was normal in size. - Right ventricle: Systolic function was normal. - Right atrium: The atrium was normal in size. - Tricuspid valve: There was trivial regurgitation. - Pulmonic valve: There was no  regurgitation. - Pulmonary arteries: Systolic pressure was within the normal   range. - Inferior vena cava: The vessel was normal in size. - Pericardium, extracardiac: There was no pericardial effusion.    ASSESSMENT:    1. Chest pain, unspecified type   2. Essential hypertension   3. Hyperlipidemia, unspecified hyperlipidemia type   4. Chronic obstructive pulmonary disease, unspecified COPD type ([place])   5. OSA on CPAP   6. Controlled type 2 diabetes mellitus with complication, without long-term current use of insulin ([facility])      PLAN:  In order of problems listed above:  1. Atypical chest pain: Location and characteristic of chest pain has changed since the hospitalization. His breathing has improved, we will pursue outpatient treadmill Myoview, if negative he can follow-up on a yearly basis.  2. HTN: Blood pressure well controlled at 122/80.  3. HLD: He is on Zocor 40 mg daily, will obtain fasting lipid panel and LFTs when he come back for mildly.  4. DM II: On metformin  5. COPD: According to the patient, he never smoked in his life, however he has been exposed to secondhand smoke from multiple siblings.    Medication Adjustments/Labs and Tests Ordered: Current medicines are reviewed at length with the patient today.  Concerns regarding medicines are outlined above.  Medication changes, Labs and Tests ordered today are listed in the Patient Instructions below. Patient Instructions  Medication Instructions:  Your physician recommends that you continue on your current medications as directed. Please refer to the Current Medication list given to you today.  Labwork: Fasting LP/LFT'S AT [facility] ON FIRST FLOOR WHEN YOU RETURN FOR YOUR STRESS TEST  Testing/Procedures: Your physician has requested that you have en exercise stress myoview. For further information please visit [email or web address]. Please follow instruction sheet, as given.  Follow-Up: Your physician wants  you to follow-up in: 1 YEAR IF STRESS TEST OK You will receive a reminder letter in the mail two months in advance. If you don't receive a letter, please call our office to schedule the follow-up appointment.  If you need a refill on  your cardiac medications before your next appointment, please call your pharmacy.     Hilbert Corrigan, [place]  02/27/2016 4:15 PM    [address], [place], [place]  [address] Phone: [phone]; Fax: [phone]

## 2016-02-27 NOTE — Patient Instructions (Addendum)
Medication Instructions:  Your physician recommends that you continue on your current medications as directed. Please refer to the Current Medication list given to you today.  Labwork: Fasting LP/LFT'S AT [facility] ON FIRST FLOOR WHEN YOU RETURN FOR YOUR STRESS TEST  Testing/Procedures: Your physician has requested that you have en exercise stress myoview. For further information please visit [email or web address]. Please follow instruction sheet, as given.  Follow-Up: Your physician wants you to follow-up in: 1 YEAR IF STRESS TEST OK You will receive a reminder letter in the mail two months in advance. If you don't receive a letter, please call our office to schedule the follow-up appointment.  If you need a refill on your cardiac medications before your next appointment, please call your pharmacy.

## 2016-02-29 ENCOUNTER — Telehealth (HOSPITAL_COMMUNITY): Payer: Self-pay

## 2016-02-29 NOTE — Telephone Encounter (Signed)
Encounter complete. 

## 2016-03-05 ENCOUNTER — Ambulatory Visit (HOSPITAL_COMMUNITY)
Admission: RE | Admit: 2016-03-05 | Discharge: 2016-03-05 | Disposition: A | Discharge status: Home / Self Care | Payer: Medicare Other | Source: Ambulatory Visit | Attending: Cardiovascular Disease | Admitting: Cardiovascular Disease

## 2016-03-05 DIAGNOSIS — E118 Type 2 diabetes mellitus with unspecified complications: Secondary | ICD-10-CM | POA: Diagnosis not present

## 2016-03-05 DIAGNOSIS — R109 Unspecified abdominal pain: Secondary | ICD-10-CM | POA: Insufficient documentation

## 2016-03-05 DIAGNOSIS — J449 Chronic obstructive pulmonary disease, unspecified: Secondary | ICD-10-CM | POA: Insufficient documentation

## 2016-03-05 DIAGNOSIS — M25512 Pain in left shoulder: Secondary | ICD-10-CM | POA: Insufficient documentation

## 2016-03-05 DIAGNOSIS — I10 Essential (primary) hypertension: Secondary | ICD-10-CM | POA: Diagnosis not present

## 2016-03-05 DIAGNOSIS — E785 Hyperlipidemia, unspecified: Secondary | ICD-10-CM | POA: Diagnosis not present

## 2016-03-05 DIAGNOSIS — G4733 Obstructive sleep apnea (adult) (pediatric): Secondary | ICD-10-CM | POA: Insufficient documentation

## 2016-03-05 DIAGNOSIS — M25511 Pain in right shoulder: Secondary | ICD-10-CM | POA: Diagnosis not present

## 2016-03-05 DIAGNOSIS — R079 Chest pain, unspecified: Secondary | ICD-10-CM | POA: Diagnosis not present

## 2016-03-05 DIAGNOSIS — Z8249 Family history of ischemic heart disease and other diseases of the circulatory system: Secondary | ICD-10-CM | POA: Insufficient documentation

## 2016-03-05 LAB — MYOCARDIAL PERFUSION IMAGING
CHL CUP NUCLEAR SRS: 0
LVDIAVOL: 105 mL (ref 62–150)
LVSYSVOL: 44 mL
Peak HR: 100 {beats}/min
Rest HR: 88 {beats}/min
SDS: 1
SSS: 1
TID: 1.02

## 2016-03-05 MED ORDER — AMINOPHYLLINE 25 MG/ML IV SOLN
75.0000 mg | Freq: Once | INTRAVENOUS | Status: AC
  Administered 2016-03-05: 75 mg via INTRAVENOUS

## 2016-03-05 MED ORDER — TECHNETIUM TC 99M TETROFOSMIN IV KIT
9.4000 | PACK | Freq: Once | INTRAVENOUS | Status: AC | PRN
  Administered 2016-03-05: 9.4 via INTRAVENOUS
  Filled 2016-03-05: qty 10

## 2016-03-05 MED ORDER — REGADENOSON 0.4 MG/5ML IV SOLN
0.4000 mg | Freq: Once | INTRAVENOUS | Status: AC
  Administered 2016-03-05: 0.4 mg via INTRAVENOUS

## 2016-03-05 MED ORDER — TECHNETIUM TC 99M TETROFOSMIN IV KIT
28.1000 | PACK | Freq: Once | INTRAVENOUS | Status: AC | PRN
  Administered 2016-03-05: 28.1 via INTRAVENOUS
  Filled 2016-03-05: qty 29

## 2016-03-06 LAB — LIPID PANEL
CHOL/HDL RATIO: 2.4 ratio (ref <5.0)
Cholesterol: 146 mg/dL (ref <200)
HDL: 62 mg/dL (ref >40)
LDL CALC: 72 mg/dL
Triglycerides: 61 mg/dL (ref <150)
VLDL: 12 mg/dL (ref <30)

## 2016-03-06 LAB — HEPATIC FUNCTION PANEL
ALBUMIN: 4.1 g/dL (ref 3.6–5.1)
ALT: 19 U/L (ref 9–46)
AST: 19 U/L (ref 10–35)
Alkaline Phosphatase: 37 U/L — ABNORMAL LOW (ref 40–115)
BILIRUBIN TOTAL: 1.2 mg/dL (ref 0.2–1.2)
Bilirubin, Direct: 0.2 mg/dL (ref <=0.2)
Indirect Bilirubin: 1 mg/dL (ref 0.2–1.2)
Total Protein: 6.5 g/dL (ref 6.1–8.1)

## 2016-03-13 ENCOUNTER — Institutional Professional Consult (permissible substitution): Payer: Medicare Other | Admitting: Pulmonary Disease

## 2016-03-25 ENCOUNTER — Encounter: Payer: Self-pay | Admitting: Pulmonary Disease

## 2016-03-25 ENCOUNTER — Ambulatory Visit (INDEPENDENT_AMBULATORY_CARE_PROVIDER_SITE_OTHER): Payer: Medicare Other | Admitting: Pulmonary Disease

## 2016-03-25 VITALS — BP 136/74 | HR 79 | Ht 70.0 in | Wt 172.0 lb

## 2016-03-25 DIAGNOSIS — J45901 Unspecified asthma with (acute) exacerbation: Secondary | ICD-10-CM | POA: Diagnosis not present

## 2016-03-25 DIAGNOSIS — Z23 Encounter for immunization: Secondary | ICD-10-CM

## 2016-03-25 DIAGNOSIS — J45909 Unspecified asthma, uncomplicated: Secondary | ICD-10-CM | POA: Diagnosis not present

## 2016-03-25 MED ORDER — FLUTICASONE FUROATE-VILANTEROL 100-25 MCG/INH IN AEPB: 1.0000 | INHALATION_SPRAY | Freq: Every day | RESPIRATORY_TRACT | 0 refills | Status: DC

## 2016-03-25 NOTE — Addendum Note (Signed)
Addended by: Len Blalock on: 03/25/2016 10:49 AM   Modules accepted: Orders

## 2016-03-25 NOTE — Addendum Note (Signed)
Addended by: Maury Dus L on: 03/25/2016 10:14 AM   Modules accepted: Orders

## 2016-03-25 NOTE — Progress Notes (Signed)
Subjective:    Patient ID: Christian [name], male    DOB: 02-25-1961, 55 y.o.   MRN: [id number]  Synopsis: Referred in 2017 for evaluation of COPD. Had an echocardiogram in September 2017 that showed a normal LVEF normal RV size and function.  HPI Chief Complaint  Patient presents with  . Advice Only    Referred for COPD. Pt moved to the area this year, here to establish care.  Pt also on cpap, uses [facility].    Christian [name] moved here in February and he is coming today to establish care for his asthma and COPD.  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   > he started taking inhaled medicines (steroids, albuterol) and prednisone (briefly took prednisone, not long term) > he continued taking inhaled corticosteroids until he ran out after he moved here. > he has not been taking the Symbicort because of the cost > he has been using albuterol 2-3 times a day in addition to an albuterol breathing treatments > he has had more trouble breathing and he was hospitalized this year in September for a flare of bronchitis > notes no smoking ever, but he was exposed to a lot of second hand smoke > He reports ongoing chest congestion and wheezing now associated with shortness of breath. He is only using albuterol as needed. He does not have any controller medicines right  now.  OSA: > sleeps with CPAP normally, but he hasn't been able to do this much lately due to increasing dyspnea > he had a sleep study more than 5 years ago > he has more headaches in the mornings now and some daytime fatigue   Past Medical History:  Diagnosis Date  . Arthritis    "left knee" (01/22/2016)  . Asthma   . Chronic lower back pain   . COPD (chronic obstructive pulmonary disease) ([place])   . High cholesterol   . Hypertension   . Neuropathy ([place])   . OSA on CPAP   . Secondhand smoke exposure   . Type II diabetes mellitus ([place]) dx'd 01/17/2016     Family History  Problem Relation Age of Onset  . Hypertension Mother   . Hypertension Father   . Heart Problems Brother     Heart transplant, pt does not know cause      Social History   Social History  . Marital status: Married    Spouse name: N/A  . Number of children: N/A  . Years of education: N/A   Occupational History  . Not on file.   Social History Main Topics  . Smoking status: Passive Smoke Exposure - Never Smoker  . Smokeless tobacco: Never Used  . Alcohol use No  . Drug use: No  . Sexual activity: Yes   Other Topics Concern  .  Not on file   Social History Narrative  . No narrative on file     Allergies  Allergen Reactions  . Phenergan [Promethazine Hcl] Itching     Outpatient Medications Prior to Visit  Medication Sig Dispense Refill  . albuterol (PROVENTIL HFA;VENTOLIN HFA) 108 (90 Base) MCG/ACT inhaler Inhale 1-2 puffs into the lungs every 6 (six) hours as needed for wheezing or shortness of breath. 1 Inhaler 11  . amLODipine (NORVASC) 10 MG tablet Take 1 tablet (10 mg total) by mouth daily. 90 tablet 2  . aspirin EC 81 MG tablet Take 1 tablet (81 mg total) by mouth daily. 90 tablet 2  . Blood Glucose Monitoring Suppl (TRUE METRIX METER) w/Device KIT Check glucose 2 times a day 1 kit 0  . budesonide-formoterol (SYMBICORT) 160-4.5 MCG/ACT inhaler Inhale 2 puffs into the lungs 2 (two)  times daily. 1 Inhaler 3  . carisoprodol (SOMA) 350 MG tablet Take 1 tablet (350 mg total) by mouth 4 (four) times daily as needed for muscle spasms. 50 tablet 0  . gabapentin (NEURONTIN) 300 MG capsule Take 1 capsule (300 mg total) by mouth 4 (four) times daily. 120 capsule 1  . glucose blood (TRUE METRIX BLOOD GLUCOSE TEST) test strip Use as instructed 100 each 12  . hydrochlorothiazide (HYDRODIURIL) 12.5 MG tablet Take 1 tablet (12.5 mg total) by mouth daily. 90 tablet 2  . ipratropium-albuterol (DUONEB) 0.5-2.5 (3) MG/3ML SOLN Take 3 mLs by nebulization every 4 (four) hours as needed. 360 mL 2  . losartan (COZAAR) 50 MG tablet Take 1 tablet (50 mg total) by mouth daily. 90 tablet 2  . metFORMIN (GLUCOPHAGE XR) 500 MG 24 hr tablet Take 1 tablet (500 mg total) by mouth daily with breakfast. 90 tablet 3  . simvastatin (ZOCOR) 40 MG tablet Take 1 tablet (40 mg total) by mouth daily. 90 tablet 2  . TRUEPLUS LANCETS 28G MISC Check glucose 2 times a day 100 each 3   No facility-administered medications prior to visit.       Review of Systems  Constitutional: Positive for unexpected weight change. Negative for fever.  HENT: Positive for congestion, ear pain, postnasal drip, sinus pressure and trouble swallowing. Negative for dental problem, nosebleeds, rhinorrhea, sneezing and sore throat.   Eyes: Negative for redness and itching.  Respiratory: Positive for cough, shortness of breath and wheezing. Negative for chest tightness.   Cardiovascular: Negative for palpitations and leg swelling.  Gastrointestinal: Negative for nausea and vomiting.  Genitourinary: Negative for dysuria.  Musculoskeletal: Negative for joint swelling.  Skin: Negative for rash.  Neurological: Negative for headaches.  Hematological: Does not bruise/bleed easily.  Psychiatric/Behavioral: Negative for dysphoric mood. The patient is not nervous/anxious.        Objective:   Physical Exam Vitals:   03/25/16 0928  BP:  136/74  Pulse: 79  SpO2: 97%  Weight: 172 lb (78 kg)  Height: _0  (1.778 m)  RA  Gen: well appearing, no acute distress HENT: NCAT, OP clear, neck supple without masses Eyes: PERRL, EOMi Lymph: no cervical lymphadenopathy PULM: CTA B CV: RRR, no mgr, no JVD GI: BS+, soft, nontender, no hsm Derm: no rash or skin breakdown MSK: normal bulk and tone Neuro: A&Ox4, CN II-XII intact, strength 5/5 in all 4 extremities Psyche: normal mood and affect   September 2017 chest x-ray images personally reviewed showing emphysematous changes but no pulmonary parenchymal abnormality otherwise  Records from his hospitalization in September 2017 reviewed were he was treated  for bronchitis.     Assessment & Plan:  Intrinsic asthma He was previously followed by a pulmonologist in [place] for what has been labeled COPD but I think it's more likely chronic obstructive asthma. He is a lifelong nonsmoker. While COPD is possible, we need to see lung function testing to further evaluate this. Depending on the results we may need to obtain alpha-1 antitrypsin level, but I want to check with his old pulmonologist to see if this was done first.  I think his symptoms are better controlled if he were taking a controller medicine. Cost is been an issue for him.  Plan: For your asthma: We will request records from your physician in [place] We will obtain a lung function test Start taking Breo 1 puff daily no matter how you feel Keep using albuterol as you are doing We will give you a pneumonia vaccine today called the Prevnar vaccine You can take over-the-counter guaifenesin twice a day as needed for increasing chest congestion We will have you follow-up with [place] in 2 weeks to see how you're doing on the new medicine    Current Outpatient Prescriptions:  .  albuterol (PROVENTIL HFA;VENTOLIN HFA) 108 (90 Base) MCG/ACT inhaler, Inhale 1-2 puffs into the lungs every 6 (six) hours as needed for wheezing  or shortness of breath., Disp: 1 Inhaler, Rfl: 11 .  amLODipine (NORVASC) 10 MG tablet, Take 1 tablet (10 mg total) by mouth daily., Disp: 90 tablet, Rfl: 2 .  aspirin EC 81 MG tablet, Take 1 tablet (81 mg total) by mouth daily., Disp: 90 tablet, Rfl: 2 .  Blood Glucose Monitoring Suppl (TRUE METRIX METER) w/Device KIT, Check glucose 2 times a day, Disp: 1 kit, Rfl: 0 .  budesonide-formoterol (SYMBICORT) 160-4.5 MCG/ACT inhaler, Inhale 2 puffs into the lungs 2 (two) times daily., Disp: 1 Inhaler, Rfl: 3 .  carisoprodol (SOMA) 350 MG tablet, Take 1 tablet (350 mg total) by mouth 4 (four) times daily as needed for muscle spasms., Disp: 50 tablet, Rfl: 0 .  gabapentin (NEURONTIN) 300 MG capsule, Take 1 capsule (300 mg total) by mouth 4 (four) times daily., Disp: 120 capsule, Rfl: 1 .  glucose blood (TRUE METRIX BLOOD GLUCOSE TEST) test strip, Use as instructed, Disp: 100 each, Rfl: 12 .  hydrochlorothiazide (HYDRODIURIL) 12.5 MG tablet, Take 1 tablet (12.5 mg total) by mouth daily., Disp: 90 tablet, Rfl: 2 .  ipratropium-albuterol (DUONEB) 0.5-2.5 (3) MG/3ML SOLN, Take 3 mLs by nebulization every 4 (four) hours as needed., Disp: 360 mL, Rfl: 2 .  losartan (COZAAR) 50 MG tablet, Take 1 tablet (50 mg total) by mouth daily., Disp: 90 tablet, Rfl: 2 .  metFORMIN (GLUCOPHAGE XR) 500 MG 24 hr tablet, Take 1 tablet (500 mg total) by mouth daily with breakfast., Disp: 90 tablet, Rfl: 3 .  simvastatin (ZOCOR) 40 MG tablet, Take 1 tablet (40 mg total) by mouth daily., Disp: 90 tablet, Rfl: 2 .  TRUEPLUS LANCETS 28G MISC, Check glucose 2 times a day, Disp: 100 each, Rfl: 3

## 2016-03-25 NOTE — Assessment & Plan Note (Signed)
He was previously followed by a pulmonologist in [place] for what has been labeled COPD but I think it's more likely chronic obstructive asthma. He is a lifelong nonsmoker. While COPD is possible, we need to see lung function testing to further evaluate this. Depending on the results we may need to obtain alpha-1 antitrypsin level, but I want to check with his old pulmonologist to see if this was done first.  I think his symptoms are better controlled if he were taking a controller medicine. Cost is been an issue for him.  Plan: For your asthma: We will request records from your physician in [place] We will obtain a lung function test Start taking Breo 1 puff daily no matter how you feel Keep using albuterol as you are doing We will give you a pneumonia vaccine today called the Prevnar vaccine You can take over-the-counter guaifenesin twice a day as needed for increasing chest congestion We will have you follow-up with [place] in 2 weeks to see how you're doing on the new medicine

## 2016-03-25 NOTE — Patient Instructions (Signed)
For your asthma: We will request records from your physician in [place] We will obtain a lung function test Start taking Breo 1 puff daily no matter how you feel Keep using albuterol as you are doing We will give you a pneumonia vaccine today called the Prevnar vaccine You can take over-the-counter guaifenesin twice a day as needed for increasing chest congestion We will have you follow-up with [place] in 2 weeks to see how you're doing on the new medicine

## 2016-04-02 ENCOUNTER — Encounter: Payer: Self-pay | Admitting: Internal Medicine

## 2016-04-02 ENCOUNTER — Ambulatory Visit: Payer: Medicare Other | Attending: Internal Medicine | Admitting: Internal Medicine

## 2016-04-02 VITALS — BP 130/83 | HR 89 | Temp 98.3°F | Resp 16 | Wt 175.6 lb

## 2016-04-02 DIAGNOSIS — I10 Essential (primary) hypertension: Secondary | ICD-10-CM | POA: Diagnosis not present

## 2016-04-02 DIAGNOSIS — G894 Chronic pain syndrome: Secondary | ICD-10-CM | POA: Diagnosis not present

## 2016-04-02 DIAGNOSIS — Z7982 Long term (current) use of aspirin: Secondary | ICD-10-CM | POA: Insufficient documentation

## 2016-04-02 DIAGNOSIS — R350 Frequency of micturition: Secondary | ICD-10-CM

## 2016-04-02 DIAGNOSIS — Z125 Encounter for screening for malignant neoplasm of prostate: Secondary | ICD-10-CM

## 2016-04-02 DIAGNOSIS — E1140 Type 2 diabetes mellitus with diabetic neuropathy, unspecified: Secondary | ICD-10-CM

## 2016-04-02 DIAGNOSIS — G4733 Obstructive sleep apnea (adult) (pediatric): Secondary | ICD-10-CM | POA: Insufficient documentation

## 2016-04-02 DIAGNOSIS — J449 Chronic obstructive pulmonary disease, unspecified: Secondary | ICD-10-CM | POA: Insufficient documentation

## 2016-04-02 DIAGNOSIS — Z23 Encounter for immunization: Secondary | ICD-10-CM

## 2016-04-02 DIAGNOSIS — J45909 Unspecified asthma, uncomplicated: Secondary | ICD-10-CM | POA: Diagnosis not present

## 2016-04-02 DIAGNOSIS — E7800 Pure hypercholesterolemia, unspecified: Secondary | ICD-10-CM | POA: Diagnosis not present

## 2016-04-02 DIAGNOSIS — F411 Generalized anxiety disorder: Secondary | ICD-10-CM | POA: Insufficient documentation

## 2016-04-02 DIAGNOSIS — Z794 Long term (current) use of insulin: Secondary | ICD-10-CM | POA: Diagnosis not present

## 2016-04-02 DIAGNOSIS — Z1159 Encounter for screening for other viral diseases: Secondary | ICD-10-CM | POA: Diagnosis not present

## 2016-04-02 LAB — GLUCOSE, POCT (MANUAL RESULT ENTRY): POC GLUCOSE: 139 mg/dL — AB (ref 70–99)

## 2016-04-02 LAB — PSA: PSA: 0.5 ng/mL (ref <=4.0)

## 2016-04-02 MED ORDER — GABAPENTIN 300 MG PO CAPS: 300.0000 mg | ORAL_CAPSULE | Freq: Four times a day (QID) | ORAL | 3 refills | Status: DC

## 2016-04-02 MED ORDER — METFORMIN HCL ER 500 MG PO TB24: 500.0000 mg | ORAL_TABLET | Freq: Every day | ORAL | 3 refills | Status: DC

## 2016-04-02 MED ORDER — SIMVASTATIN 40 MG PO TABS: 40.0000 mg | ORAL_TABLET | Freq: Every day | ORAL | 2 refills | Status: DC

## 2016-04-02 MED ORDER — CARISOPRODOL 350 MG PO TABS: 350.0000 mg | ORAL_TABLET | Freq: Four times a day (QID) | ORAL | 0 refills | Status: DC | PRN

## 2016-04-02 MED ORDER — AMLODIPINE BESYLATE 10 MG PO TABS: 10.0000 mg | ORAL_TABLET | Freq: Every day | ORAL | 2 refills | Status: DC

## 2016-04-02 MED ORDER — IPRATROPIUM-ALBUTEROL 0.5-2.5 (3) MG/3ML IN SOLN: 3.0000 mL | RESPIRATORY_TRACT | 2 refills | Status: DC | PRN

## 2016-04-02 MED ORDER — ASPIRIN EC 81 MG PO TBEC: 81.0000 mg | DELAYED_RELEASE_TABLET | Freq: Every day | ORAL | 2 refills | Status: DC

## 2016-04-02 MED ORDER — LOSARTAN POTASSIUM-HCTZ 50-12.5 MG PO TABS: 1.0000 | ORAL_TABLET | Freq: Every day | ORAL | 3 refills | Status: DC

## 2016-04-02 MED ORDER — FLUTICASONE FUROATE-VILANTEROL 100-25 MCG/INH IN AEPB: 1.0000 | INHALATION_SPRAY | Freq: Every day | RESPIRATORY_TRACT | 3 refills | Status: DC

## 2016-04-02 NOTE — Progress Notes (Signed)
Christian [name], is a 55 y.o. male  MAU:[id number]  BWL:[id number]  DOB - 18-Aug-1960  Chief Complaint  Patient presents with  . Cough  . Hypertension  . COPD        Subjective:   Christian [name] is a 55 y.o. male here today for a follow up visit for htn, dm, asthma, and chronic pain syndrome. Pt still has not seen [facility]. C/o of sinif. Body pains, ran out of soma, but has not started neurontin.  He is taking all his meds as prescribed, but had hard time naming his 3 blood pressure meds he was on to me.  Using nebs as need, breo is helping his resp sx, needs refill, and only using rescue rarely. Recently eval by Pulm 11/28, and felt that rather than copd, that he had chronic obstructive asthma.  Pt states he is doing better overal resp wise, less coughing/sob.  Mild heal pains/aches last few days when he woke up, resolved once up and about.  Patient has No headache, No chest pain, No abdominal pain - No Nausea, No new weakness tingling or numbness, No Cough - SOB.  Problem  Gad (Generalized Anxiety Disorder) (Resolved)  Morbidly Obese (Hcc) (Resolved)    ALLERGIES: Allergies  Allergen Reactions  . Phenergan [Promethazine Hcl] Itching    PAST MEDICAL HISTORY: Past Medical History:  Diagnosis Date  . Arthritis    "left knee" (01/22/2016)  . Asthma   . Chronic lower back pain   . COPD (chronic obstructive pulmonary disease) ([place])   . High cholesterol   . Hypertension   . Neuropathy ([place])   . OSA on CPAP   . Secondhand smoke exposure   . Type II diabetes mellitus ([place]) dx'd 01/17/2016    MEDICATIONS AT HOME: Prior to Admission medications   Medication Sig Start Date End Date Taking? Authorizing Provider  albuterol (PROVENTIL HFA;VENTOLIN HFA) 108 (90 Base) MCG/ACT inhaler Inhale 1-2 puffs into the lungs every 6 (six) hours as needed for wheezing or shortness of breath. 01/14/16   Maren Reamer, MD  amLODipine (NORVASC) 10 MG tablet Take 1 tablet (10 mg total) by mouth daily.  04/02/16   Maren Reamer, MD  aspirin EC 81 MG tablet Take 1 tablet (81 mg total) by mouth daily. 04/02/16   Maren Reamer, MD  Blood Glucose Monitoring Suppl (TRUE METRIX METER) w/Device KIT Check glucose 2 times a day 02/01/16   Maren Reamer, MD  carisoprodol (SOMA) 350 MG tablet Take 1 tablet (350 mg total) by mouth 4 (four) times daily as needed for muscle spasms. 04/02/16   Maren Reamer, MD  fluticasone furoate-vilanterol (BREO ELLIPTA) 100-25 MCG/INH AEPB Inhale 1 puff into the lungs daily. 04/02/16   Maren Reamer, MD  gabapentin (NEURONTIN) 300 MG capsule Take 1 capsule (300 mg total) by mouth 4 (four) times daily. 04/02/16   Maren Reamer, MD  glucose blood (TRUE METRIX BLOOD GLUCOSE TEST) test strip Use as instructed 02/01/16   Maren Reamer, MD  ipratropium-albuterol (DUONEB) 0.5-2.5 (3) MG/3ML SOLN Take 3 mLs by nebulization every 4 (four) hours as needed. 04/02/16   Maren Reamer, MD  losartan-hydrochlorothiazide (HYZAAR) 50-12.5 MG tablet Take 1 tablet by mouth daily. 04/02/16   Maren Reamer, MD  metFORMIN (GLUCOPHAGE XR) 500 MG 24 hr tablet Take 1 tablet (500 mg total) by mouth daily with breakfast. 04/02/16   Maren Reamer, MD  simvastatin (ZOCOR) 40 MG tablet Take 1 tablet (40  mg total) by mouth daily. 04/02/16   Maren Reamer, MD  TRUEPLUS LANCETS 28G MISC Check glucose 2 times a day 02/01/16   Maren Reamer, MD     Objective:   Vitals:   04/02/16 0953  BP: 130/83  Pulse: 89  Resp: 16  Temp: 98.3 F (36.8 C)  TempSrc: Oral  SpO2: 95%  Weight: 175 lb 9.6 oz (79.7 kg)    Exam General appearance : Awake, alert, not in any distress. Speech Clear. Not toxic looking, pleasant. HEENT: Atraumatic and Normocephalic, pupils equally reactive to light. Neck: supple, no JVD.  Chest:Good air entry bilaterally, no added sounds. CVS: S1 S2 regular, no murmurs/gallups or rubs. Abdomen: Bowel sounds active, Non tender and not distended with no gaurding,  rigidity or rebound. Foot exam: bilateral peripheral pulses 2+ (dorsalis pedis and post tibialis pulses), no ulcers noted/no ecchymosis, warm to touch, monofilament testing 3/3 bilat. Sensation intact.  No c/c/e.  Dry ashen feet. Neurology: Awake alert, and oriented X 3, CN II-XII grossly intact, Non focal Skin:No Rash  Data Review Lab Results  Component Value Date   HGBA1C 6.5 01/14/2016    Depression screen PHQ 2/9 04/02/2016  Decreased Interest 3  Down, Depressed, Hopeless 0  PHQ - 2 Score 3  Altered sleeping 0  Tired, decreased energy 0  Change in appetite 0  Feeling bad or failure about yourself  0  Trouble concentrating 0  Moving slowly or fidgety/restless 0  Suicidal thoughts 0  PHQ-9 Score 3      Assessment & Plan   1. htn Controlled - changed losartan and hctz to hyzaar 50-12.5 for better compliance -Continue norvasc 10  2. Chronic obstructive asthma Renewed abuterol mdi, prn; renewed breo, renewed duonebs Sp prevnar 13 on 11/28, will needs ppsv23 in 8wks afterwards.  3. Type 2 diabetes mellitus with diabetic neuropathy, without long-term current use of insulin (HCC) - POCT glucose (manual entry)  139 - Ambulatory referral to Ophthalmology - Microalbumin/Creatinine Ratio, Urine - renewed metformin - recd starting neurontin, may help his foot pains if neuropathy related. -continue ada diet.  4. Chronic pain syndrome  - pt still waiting for ref for pmr, sent email to referral specialist to fu - renewed soma, prn use - recd pt starts neurontin as well, scheduled. - pain contract signed today. - - pain contract signed today, but if PMR ends up increasing his rx, than we will null our contract.   5. Prostate cancer screening, hx of Urinary frequency - PSA  6. tdap today.  7. Need for hepatitis C screening test - Hepatitis C antibody  Patient have been counseled extensively about nutrition and exercise  Return in about 3 months (around 07/01/2016), or  if symptoms worsen or fail to improve.  The patient was given clear instructions to go to ER or return to medical center if symptoms don't improve, worsen or new problems develop. The patient verbalized understanding. The patient was told to call to get lab results if they haven't heard anything in the next week.   This note has been created with Surveyor, quantity. Any transcriptional errors are unintentional.   Maren Reamer, MD, [facility] [place], [place]   04/02/2016, 10:46 AM

## 2016-04-02 NOTE — Patient Instructions (Addendum)
Td Vaccine (Tetanus and Diphtheria): What You Need to Know 1. Why get vaccinated? Tetanus  and diphtheria are very serious diseases. They are rare in the [place] today, but people who do become infected often have severe complications. Td vaccine is used to protect adolescents and adults from both of these diseases. Both tetanus and diphtheria are infections caused by bacteria. Diphtheria spreads from person to person through coughing or sneezing. Tetanus-causing bacteria enter the body through cuts, scratches, or wounds. TETANUS (lockjaw) causes painful muscle tightening and stiffness, usually all over the body.  It can lead to tightening of muscles in the head and neck so you can't open your mouth, swallow, or sometimes even breathe. Tetanus kills about 1 out of every 10 people who are infected even after receiving the best medical care. DIPHTHERIA can cause a thick coating to form in the back of the throat.  It can lead to breathing problems, paralysis, heart failure, and death. Before vaccines, as many as 200,000 cases of diphtheria and hundreds of cases of tetanus were reported in the [place] each year. Since vaccination began, reports of cases for both diseases have dropped by about 99%. 2. Td vaccine Td vaccine can protect adolescents and adults from tetanus and diphtheria. Td is usually given as a booster dose every 10 years but it can also be given earlier after a severe and dirty wound or burn. Another vaccine, called Tdap, which protects against pertussis in addition to tetanus and diphtheria, is sometimes recommended instead of Td vaccine. Your doctor or the person giving you the vaccine can give you more information. Td may safely be given at the same time as other vaccines. 3. Some people should not get this vaccine  A person who has ever had a life-threatening allergic reaction after a previous dose of any tetanus or diphtheria containing vaccine, OR has a severe allergy  to any part of this vaccine, should not get Td vaccine. Tell the person giving the vaccine about any severe allergies.  Talk to your doctor if you:  had severe pain or swelling after any vaccine containing diphtheria or tetanus,  ever had a condition called Guillain Barre Syndrome (GBS),  aren't feeling well on the day the shot is scheduled. 4. What are the risks from Td vaccine? With any medicine, including vaccines, there is a chance of side effects. These are usually mild and go away on their own. Serious reactions are also possible but are rare. Most people who get Td vaccine do not have any problems with it. Mild problems following Td vaccine: (Did not interfere with activities)  Pain where the shot was given (about 8 people in 10)  Redness or swelling where the shot was given (about 1 person in 4)  Mild fever (rare)  Headache (about 1 person in 4)  Tiredness (about 1 person in 4) Moderate problems following Td vaccine: (Interfered with activities, but did not require medical attention)  Fever over 102F (rare) Severe problems following Td vaccine: (Unable to perform usual activities; required medical attention)  Swelling, severe pain, bleeding and/or redness in the arm where the shot was given (rare). Problems that could happen after any vaccine:  People sometimes faint after a medical procedure, including vaccination. Sitting or lying down for about 15 minutes can help prevent fainting, and injuries caused by a fall. Tell your doctor if you feel dizzy, or have vision changes or ringing in the ears.  Some people get severe pain in the shoulder  and have difficulty moving the arm where a shot was given. This happens very rarely.  Any medication can cause a severe allergic reaction. Such reactions from a vaccine are very rare, estimated at fewer than 1 in a million doses, and would happen within a few minutes to a few hours after the vaccination. As with any medicine, there  is a very remote chance of a vaccine causing a serious injury or death. The safety of vaccines is always being monitored. For more information, visit: http://www.aguilar.org/ 5. What if there is a serious reaction? What should I look for? Look for anything that concerns you, such as signs of a severe allergic reaction, very high fever, or unusual behavior. Signs of a severe allergic reaction can include hives, swelling of the face and throat, difficulty breathing, a fast heartbeat, dizziness, and weakness. These would usually start a few minutes to a few hours after the vaccination. What should I do?  If you think it is a severe allergic reaction or other emergency that can't wait, call 9-1-1 or get the person to the nearest hospital. Otherwise, call your doctor.  Afterward, the reaction should be reported to the Vaccine Adverse Event Reporting System (VAERS). Your doctor might file this report, or you can do it yourself through the VAERS web site at www.vaers.SamedayNews.es, or by calling 804-708-7431.  VAERS does not give medical advice. 6. The National Vaccine Injury Compensation Program The [employer] Vaccine Injury Compensation Program (VICP) is a federal program that was created to compensate people who may have been injured by certain vaccines. Persons who believe they may have been injured by a vaccine can learn about the program and about filing a claim by calling 954-460-5313 or visiting the [employer] website at GoldCloset.com.ee. There is a time limit to file a claim for compensation. 7. How can I learn more?  Ask your doctor. He or she can give you the vaccine package insert or suggest other sources of information.  Call your local or state health department.  Contact the Centers for Disease Control and Prevention (CDC):  Call (808)342-8609 (1-800-CDC-INFO)  Visit CDC's website at http://hunter.com/ CDC Td Vaccine VIS (08/07/15) This information is not intended to  replace advice given to you by your health care provider. Make sure you discuss any questions you have with your health care provider. Document Released: 02/09/2006 Document Revised: 01/03/2016 Document Reviewed: 01/03/2016 Elsevier Interactive Patient Education  2017 Elsevier Inc.  -   Chronic Pain, Adult Chronic pain is a type of pain that lasts or keeps coming back (recurs) for at least six months. You may have chronic headaches, abdominal pain, or body pain. Chronic pain may be related to an illness, such as fibromyalgia or complex regional pain syndrome. Sometimes the cause of chronic pain is not known. Chronic pain can make it hard for you to do daily activities. If not treated, chronic pain can lead to other health problems, including anxiety and depression. Treatment depends on the cause and severity of your pain. You may need to work with a pain specialist to come up with a treatment plan. The plan may include medicine, counseling, and physical therapy. Many people benefit from a combination of two or more types of treatment to control their pain. Follow these instructions at home: Lifestyle  Consider keeping a pain diary to share with your health care providers.  Consider talking with a mental health care provider (psychologist) about how to cope with chronic pain.  Consider joining a chronic pain support  group.  Try to control or lower your stress levels. Talk to your health care provider about strategies to do this. General instructions  Take over-the-counter and prescription medicines only as told by your health care provider.  Follow your treatment plan as told by your health care provider. This may include:  Gentle, regular exercise.  Eating a healthy diet that includes foods such as vegetables, fruits, fish, and lean meats.  Cognitive or behavioral therapy.  Working with a Community education officer.  Meditation or yoga.  Acupuncture or massage therapy.  Aroma, color,  light, or sound therapy.  Local electrical stimulation.  Shots (injections) of numbing or pain-relieving medicines into the spine or the area of pain.  Check your pain level as told by your health care provider. Ask your health care provider if you should use a pain scale.  Learn as much as you can about how to manage your chronic pain. Ask your health care provider if an intensive pain rehabilitation program or a chronic pain specialist would be helpful.  Keep all follow-up visits as told by your health care provider. This is important. Contact a health care provider if:  Your pain gets worse.  You have new pain.  You have trouble sleeping.  You have trouble doing your normal activities.  Your pain is not controlled with treatment.  Your have side effects from pain medicine.  You feel weak. Get help right away if:  You lose feeling or have numbness in your body.  You lose control of bowel or bladder function.  Your pain suddenly gets much worse.  You develop shaking or chills.  You develop confusion.  You develop chest pain.  You have trouble breathing or shortness of breath.  You pass out.  You have thoughts about hurting yourself or others. This information is not intended to replace advice given to you by your health care provider. Make sure you discuss any questions you have with your health care provider. Document Released: 01/04/2002 Document Revised: 12/13/2015 Document Reviewed: 10/02/2015 Elsevier Interactive Patient Education  2017 [employer].   -  Neuropathic Pain Introduction Neuropathic pain is pain caused by damage to the nerves that are responsible for certain sensations in your body (sensory nerves). The pain can be caused by damage to:  The sensory nerves that send signals to your spinal cord and brain (peripheral nervous system).  The sensory nerves in your brain or spinal cord (central nervous system). Neuropathic pain can make you  more sensitive to pain. What would be a minor sensation for most people may feel very painful if you have neuropathic pain. This is usually a long-term condition that can be difficult to treat. The type of pain can differ from person to person. It may start suddenly (acute), or it may develop slowly and last for a long time (chronic). Neuropathic pain may come and go as damaged nerves heal or may stay at the same level for years. It often causes emotional distress, loss of sleep, and a lower quality of life. What are the causes? The most common cause of damage to a sensory nerve is diabetes. Many other diseases and conditions can also cause neuropathic pain. Causes of neuropathic pain can be classified as:  Toxic. Many drugs and chemicals can cause toxic damage. The most common cause of toxic neuropathic pain is damage from drug treatment for cancer (chemotherapy).  Metabolic. This type of pain can happen when a disease causes imbalances that damage nerves. Diabetes is the most  common of these diseases. Vitamin B deficiency caused by long-term alcohol abuse is another common cause.  Traumatic. Any injury that cuts, crushes, or stretches a nerve can cause damage and pain. A common example is feeling pain after losing an arm or leg (phantom limb pain).  Compression-related. If a sensory nerve gets trapped or compressed for a long period of time, the blood supply to the nerve can be cut off.  Vascular. Many blood vessel diseases can cause neuropathic pain by decreasing blood supply and oxygen to nerves.  Autoimmune. This type of pain results from diseases in which the body's defense system mistakenly attacks sensory nerves. Examples of autoimmune diseases that can cause neuropathic pain include lupus and multiple sclerosis.  Infectious. Many types of viral infections can damage sensory nerves and cause pain. Shingles infection is a common cause of this type of pain.  Inherited. Neuropathic pain can be  a symptom of many diseases that are passed down through families (genetic). What are the signs or symptoms? The main symptom is pain. Neuropathic pain is often described as:  Burning.  Shock-like.  Stinging.  Hot or cold.  Itching. How is this diagnosed? No single test can diagnose neuropathic pain. Your health care provider will do a physical exam and ask you about your pain. You may use a pain scale to describe how bad your pain is. You may also have tests to see if you have a high sensitivity to pain and to help find the cause and location of any sensory nerve damage. These tests may include:  Imaging studies, such as:  X-rays.  CT scan.  MRI.  Nerve conduction studies to test how well nerve signals travel through your sensory nerves (electrodiagnostic testing).  Stimulating your sensory nerves through electrodes on your skin and measuring the response in your spinal cord and brain (somatosensory evoked potentials). How is this treated? Treatment for neuropathic pain may change over time. You may need to try different treatment options or a combination of treatments. Some options include:  Over-the-counter pain relievers.  Prescription medicines. Some medicines used to treat other conditions may also help neuropathic pain. These include medicines to:  Control seizures (anticonvulsants).  Relieve depression (antidepressants).  Prescription-strength pain relievers (narcotics). These are usually used when other pain relievers do not help.  Transcutaneous nerve stimulation (TENS). This uses electrical currents to block painful nerve signals. The treatment is painless.  Topical and local anesthetics. These are medicines that numb the nerves. They can be injected as a nerve block or applied to the skin.  Alternative treatments, such as:  Acupuncture.  Meditation.  Massage.  Physical therapy.  Pain management programs.  Counseling. Follow these instructions at  home:  Learn as much as you can about your condition.  Take medicines only as directed by your health care provider.  Work closely with all your health care providers to find what works best for you.  Have a good support system at home.  Consider joining a chronic pain support group. Contact a health care provider if:  Your pain treatments are not helping.  You are having side effects from your medicines.  You are struggling with fatigue, mood changes, depression, or anxiety. This information is not intended to replace advice given to you by your health care provider. Make sure you discuss any questions you have with your health care provider. Document Released: 01/10/2004 Document Revised: 11/02/2015 Document Reviewed: 09/22/2013  2017 Elsevier

## 2016-04-03 ENCOUNTER — Other Ambulatory Visit: Payer: Self-pay | Admitting: *Deleted

## 2016-04-03 LAB — HEPATITIS C ANTIBODY: HCV Ab: NEGATIVE

## 2016-04-03 LAB — MICROALBUMIN / CREATININE URINE RATIO
CREATININE, URINE: 164 mg/dL (ref 20–370)
Microalb Creat Ratio: 2 mcg/mg creat (ref <30)
Microalb, Ur: 0.4 mg/dL

## 2016-04-09 ENCOUNTER — Ambulatory Visit: Payer: Medicare Other | Admitting: Adult Health

## 2016-04-09 ENCOUNTER — Other Ambulatory Visit: Payer: Self-pay | Admitting: *Deleted

## 2016-04-09 MED ORDER — IPRATROPIUM-ALBUTEROL 0.5-2.5 (3) MG/3ML IN SOLN: 3.0000 mL | RESPIRATORY_TRACT | 2 refills | Status: DC | PRN

## 2016-04-09 NOTE — Telephone Encounter (Signed)
Refill for Duoneb electronically sent to pharmacy.

## 2016-04-15 ENCOUNTER — Telehealth: Payer: Self-pay | Admitting: *Deleted

## 2016-04-15 MED ORDER — ALBUTEROL SULFATE HFA 108 (90 BASE) MCG/ACT IN AERS: 2.0000 | INHALATION_SPRAY | Freq: Four times a day (QID) | RESPIRATORY_TRACT | 11 refills | Status: DC | PRN

## 2016-04-15 MED ORDER — NAPROXEN 500 MG PO TABS: 500.0000 mg | ORAL_TABLET | Freq: Three times a day (TID) | ORAL | 2 refills | Status: DC

## 2016-04-15 NOTE — Telephone Encounter (Signed)
Christian [name] from [facility] called to get coverage for new medications to take place of SOMA and albuterol. She gave a few options, meloxicam, Ibuprofen, Naproxen, Cyclobenzaprine, and tizanidine. She also stated that insurance will cover Ventolin HFA. Please send prescription to pt pharmacy: [facility]

## 2016-04-15 NOTE — Telephone Encounter (Signed)
Pt aware he may pick up rx's from pharmacy.

## 2016-04-15 NOTE — Telephone Encounter (Signed)
I changed albuterol to ventolin and rx for naproxen placed as well. thanks

## 2016-05-07 ENCOUNTER — Encounter: Payer: Self-pay | Admitting: Adult Health

## 2016-05-07 ENCOUNTER — Ambulatory Visit (INDEPENDENT_AMBULATORY_CARE_PROVIDER_SITE_OTHER): Payer: Medicare Other | Admitting: Pulmonary Disease

## 2016-05-07 ENCOUNTER — Ambulatory Visit (INDEPENDENT_AMBULATORY_CARE_PROVIDER_SITE_OTHER): Payer: Medicare Other | Admitting: Adult Health

## 2016-05-07 DIAGNOSIS — J209 Acute bronchitis, unspecified: Secondary | ICD-10-CM

## 2016-05-07 DIAGNOSIS — J45901 Unspecified asthma with (acute) exacerbation: Secondary | ICD-10-CM

## 2016-05-07 DIAGNOSIS — J4541 Moderate persistent asthma with (acute) exacerbation: Secondary | ICD-10-CM

## 2016-05-07 LAB — PULMONARY FUNCTION TEST
DL/VA % PRED: 108 %
DL/VA: 4.87 ml/min/mmHg/L
DLCO cor % pred: 69 %
DLCO cor: 20.65 ml/min/mmHg
DLCO unc % pred: 74 %
DLCO unc: 22.17 ml/min/mmHg
FEF 25-75 Post: 0.45 L/sec
FEF 25-75 Pre: 0.99 L/sec
FEF2575-%CHANGE-POST: -54 %
FEF2575-%Pred-Post: 15 %
FEF2575-%Pred-Pre: 34 %
FEV1-%Change-Post: -3 %
FEV1-%PRED-PRE: 60 %
FEV1-%Pred-Post: 58 %
FEV1-Post: 1.73 L
FEV1-Pre: 1.79 L
FEV1FVC-%CHANGE-POST: 21 %
FEV1FVC-%Pred-Pre: 73 %
FEV6-%CHANGE-POST: -20 %
FEV6-%PRED-PRE: 83 %
FEV6-%Pred-Post: 66 %
FEV6-PRE: 3.07 L
FEV6-Post: 2.43 L
FEV6FVC-%Change-Post: 1 %
FEV6FVC-%PRED-PRE: 101 %
FEV6FVC-%Pred-Post: 103 %
FVC-%CHANGE-POST: -20 %
FVC-%PRED-POST: 64 %
FVC-%PRED-PRE: 82 %
FVC-PRE: 3.11 L
FVC-Post: 2.46 L
POST FEV6/FVC RATIO: 100 %
Post FEV1/FVC ratio: 70 %
Pre FEV1/FVC ratio: 58 %
Pre FEV6/FVC Ratio: 99 %
RV % PRED: 103 %
RV: 2.13 L
TLC % pred: 82 %
TLC: 5.42 L

## 2016-05-07 MED ORDER — AZITHROMYCIN 250 MG PO TABS
ORAL_TABLET | ORAL | 0 refills | Status: AC
Start: 2016-05-07 — End: 2016-05-12

## 2016-05-07 MED ORDER — FLUTICASONE FUROATE-VILANTEROL 100-25 MCG/INH IN AEPB: 1.0000 | INHALATION_SPRAY | Freq: Every day | RESPIRATORY_TRACT | 0 refills | Status: DC

## 2016-05-07 NOTE — Progress Notes (Signed)
@Patient  ID: Christian [name], male    DOB: [birth year], 56 y.o.   MRN: [id number]  Chief Complaint  Patient presents with  . Follow-up    Asthma     Referring provider: Maren Reamer, MD  HPI: 56 yo male never smoker seen for asthma consult 03/25/16  Has OSA on CPAP At bedtime    TEST ;Events  PFT 05/16/2016 >FEV1 60%, ratio 58, FVC 82%, DLCO 74%, no BD response .   05/16/2016 Follow up : Asthma  Pt returns for 2 month follow up for asthma . He was seen for pulmonary consult last ov and  Started on BREO last ov y . Had PFT today that showed moderate airflow obstruction with  FEV1 60%, ratio 58. No BD response. Reviewed these in detail with pt .  Says he does feel that BREO is helping some.   Complains of cough, congestion, drainage and nasal stuffiness. For last 1 weeks.  Seems to start in sinuses and drain down .  Has chronic sinus issues with surgery in 2014.  Under a lot of stress, raising 60yrgrandsom. Wife with recent heart surgery .   Allergies  Allergen Reactions  . Phenergan [Promethazine Hcl] Itching    Immunization History  Administered Date(s) Administered  . Influenza Split 02/23/2016  . Pneumococcal Conjugate-13 03/25/2016  . Tdap 04/02/2016    Past Medical History:  Diagnosis Date  . Arthritis    "left knee" (01/22/2016)  . Asthma   . Chronic lower back pain   . COPD (chronic obstructive pulmonary disease) ([place]   . High cholesterol   . Hypertension   . [place]   . OSA on CPAP   . Secondhand smoke exposure   . Type II diabetes mellitus ([place] dx[place] 01/17/2016    Tobacco History: History  Smoking Status  . Passive Smoke Exposure - Never Smoker  Smokeless Tobacco  . Never Used   Counseling given: Not Answered   Outpatient Encounter Prescriptions as of 05/07/2016  Medication Sig  . albuterol (VENTOLIN HFA) 108 (90 Base) MCG/ACT inhaler Inhale 2 puffs into the lungs every 6 (six) hours as needed for wheezing or shortness of breath.  .Marland Kitchen amLODipine (NORVASC) 10 MG tablet Take 1 tablet (10 mg total) by mouth daily.  .Marland Kitchenaspirin EC 81 MG tablet Take 1 tablet (81 mg total) by mouth daily.  . Blood Glucose Monitoring Suppl (TRUE METRIX METER) w/Device KIT Check glucose 2 times a day  . carisoprodol (SOMA) 350 MG tablet Take 1 tablet (350 mg total) by mouth 4 (four) times daily as needed for muscle spasms.  . fluticasone furoate-vilanterol (BREO ELLIPTA) 100-25 MCG/INH AEPB Inhale 1 puff into the lungs daily.  .Marland Kitchengabapentin (NEURONTIN) 300 MG capsule Take 1 capsule (300 mg total) by mouth 4 (four) times daily.  .Marland Kitchenglucose blood (TRUE METRIX BLOOD GLUCOSE TEST) test strip Use as instructed  . ipratropium-albuterol (DUONEB) 0.5-2.5 (3) MG/3ML SOLN Take 3 mLs by nebulization every 4 (four) hours as needed.  .Marland Kitchenlosartan-hydrochlorothiazide (HYZAAR) 50-12.5 MG tablet Take 1 tablet by mouth daily.  . metFORMIN (GLUCOPHAGE XR) 500 MG 24 hr tablet Take 1 tablet (500 mg total) by mouth daily with breakfast.  . naproxen (NAPROSYN) 500 MG tablet Take 1 tablet (500 mg total) by mouth 3 (three) times daily with meals.  . simvastatin (ZOCOR) 40 MG tablet Take 1 tablet (40 mg total) by mouth daily.  . TRUEPLUS LANCETS 28G MISC Check glucose 2 times a day  . [  DISCONTINUED] fluticasone furoate-vilanterol (BREO ELLIPTA) 100-25 MCG/INH AEPB Inhale 1 puff into the lungs daily.  . [EXPIRED] azithromycin (ZITHROMAX Z-PAK) 250 MG tablet Take 2 tablets (500 mg) on  Day 1,  followed by 1 tablet (250 mg) once daily on Days 2 through 5.   No facility-administered encounter medications on file as of 05/07/2016.      Review of Systems  Constitutional:   No  weight loss, night sweats,  Fevers, chills, fatigue, or  lassitude.  HEENT:   No headaches,  Difficulty swallowing,  Tooth/dental problems, or  Sore throat,                No sneezing, itching, ear ache, _+nasal congestion, post nasal drip,   CV:  No chest pain,  Orthopnea, PND, swelling in lower  extremities, anasarca, dizziness, palpitations, syncope.   GI  No heartburn, indigestion, abdominal pain, nausea, vomiting, diarrhea, change in bowel habits, loss of appetite, bloody stools.   Resp:   No chest wall deformity  Skin: no rash or lesions.  GU: no dysuria, change in color of urine, no urgency or frequency.  No flank pain, no hematuria   MS:  No joint pain or swelling.  No decreased range of motion.  No back pain.    Physical Exam  BP (!) 146/88 (BP Location: Left Arm, Cuff Size: Normal)   Pulse 76   Temp 98.1 F (36.7 C) (Oral)   Ht 5' 10"  (1.778 m)   Wt 174 lb (78.9 kg)   SpO2 97%   BMI 24.97 kg/m   GEN: A/Ox3; pleasant , NAD, well nourished    HEENT:  [place]/AT,  EACs-clear, TMs-wnl, NOSE-clear drainage , THROAT-clear, no lesions, no postnasal drip or exudate noted.   NECK:  Supple w/ fair ROM; no JVD; normal carotid impulses w/o bruits; no thyromegaly or nodules palpated; no lymphadenopathy.    RESP  Clear  P & A; w/o, wheezes/ rales/ or rhonchi. no accessory muscle use, no dullness to percussion  CARD:  RRR, no m/r/g, no peripheral edema, pulses intact, no cyanosis or clubbing.  GI:   Soft & nt; nml bowel sounds; no organomegaly or masses detected.   Musco: Warm bil, no deformities or joint swelling noted.   Neuro: alert, no focal deficits noted.    Skin: Warm, no lesions or rashes  Psych:  No change in mood or affect. No depression or anxiety.  No memory loss.  Lab Results:  CBC    Component Value Date/Time   WBC 4.7 01/21/2016 1906   RBC 5.19 01/21/2016 1906   HGB 15.2 01/21/2016 1906   HCT 45.3 01/21/2016 1906   PLT 206 01/21/2016 1906   MCV 87.3 01/21/2016 1906   MCH 29.3 01/21/2016 1906   MCHC 33.6 01/21/2016 1906   RDW 13.6 01/21/2016 1906   LYMPHSABS 1.1 09/15/2015 0928   MONOABS 0.2 09/15/2015 0928   EOSABS 0.3 09/15/2015 0928   BASOSABS 0.0 09/15/2015 0928    BMET    Component Value Date/Time   NA 135 01/23/2016 0410   K 3.8  01/23/2016 0410   CL 98 (L) 01/23/2016 0410   CO2 25 01/23/2016 0410   GLUCOSE 172 (H) 01/23/2016 0410   BUN 14 01/23/2016 0410   CREATININE 1.07 01/23/2016 0410   CREATININE 1.09 01/14/2016 1523   CALCIUM 9.4 01/23/2016 0410   GFRNONAA >60 01/23/2016 0410   GFRNONAA 75 01/14/2016 1523   GFRAA >60 01/23/2016 0410   GFRAA 87 01/14/2016 1523  BNP No results found for: BNP  ProBNP No results found for: PROBNP  Imaging: No results found.   Assessment & Plan:   No problem-specific Assessment & Plan notes found for this encounter.     Rexene Edison, NP 05/16/2016

## 2016-05-07 NOTE — Patient Instructions (Signed)
Continue on BREO 1 puff daily . Rinse well after use.  Zpack take as directed.  Mucinex DM Twice daily  As needed  Cough /congestion  Saline nasal rinses As needed   Flonase 2 puff daily .As needed  Nasal congestion  Please contact office for sooner follow up if symptoms do not improve or worsen or seek emergency care  follow up Dr. Lake Bells in 3-4 months and As needed

## 2016-05-16 NOTE — Assessment & Plan Note (Signed)
Flare with sinusitis   Plan  Patient Instructions  Continue on BREO 1 puff daily . Rinse well after use.  Zpack take as directed.  Mucinex DM Twice daily  As needed  Cough /congestion  Saline nasal rinses As needed   Flonase 2 puff daily .As needed  Nasal congestion  Please contact office for sooner follow up if symptoms do not improve or worsen or seek emergency care  follow up Dr. Lake Bells in 3-4 months and As needed

## 2016-05-16 NOTE — Assessment & Plan Note (Signed)
Flare with URI/sinusitis  PFT show moderate chronic obstructive asthma   Plan  Patient Instructions  Continue on BREO 1 puff daily . Rinse well after use.  Zpack take as directed.  Mucinex DM Twice daily  As needed  Cough /congestion  Saline nasal rinses As needed   Flonase 2 puff daily .As needed  Nasal congestion  Please contact office for sooner follow up if symptoms do not improve or worsen or seek emergency care  follow up Dr. Lake Bells in 3-4 months and As needed

## 2016-05-22 ENCOUNTER — Other Ambulatory Visit: Payer: Self-pay

## 2016-05-22 MED ORDER — AMLODIPINE BESYLATE 10 MG PO TABS: 10.0000 mg | ORAL_TABLET | Freq: Every day | ORAL | 3 refills | Status: DC

## 2016-05-22 MED ORDER — IPRATROPIUM-ALBUTEROL 0.5-2.5 (3) MG/3ML IN SOLN: 3.0000 mL | RESPIRATORY_TRACT | 2 refills | Status: DC | PRN

## 2016-05-22 MED ORDER — SIMVASTATIN 40 MG PO TABS: 40.0000 mg | ORAL_TABLET | Freq: Every day | ORAL | 3 refills | Status: DC

## 2016-05-22 MED ORDER — GABAPENTIN 300 MG PO CAPS: 300.0000 mg | ORAL_CAPSULE | Freq: Four times a day (QID) | ORAL | 3 refills | Status: DC

## 2016-05-22 MED ORDER — LOSARTAN POTASSIUM-HCTZ 50-12.5 MG PO TABS: 1.0000 | ORAL_TABLET | Freq: Every day | ORAL | 3 refills | Status: DC

## 2016-05-22 MED ORDER — METFORMIN HCL ER 500 MG PO TB24: 500.0000 mg | ORAL_TABLET | Freq: Every day | ORAL | 3 refills | Status: DC

## 2016-05-26 ENCOUNTER — Other Ambulatory Visit: Payer: Self-pay | Admitting: Internal Medicine

## 2016-05-26 MED ORDER — MELOXICAM 15 MG PO TABS: 15.0000 mg | ORAL_TABLET | Freq: Every day | ORAL | 2 refills | Status: DC

## 2016-05-26 MED ORDER — CARISOPRODOL 350 MG PO TABS: 350.0000 mg | ORAL_TABLET | Freq: Four times a day (QID) | ORAL | 0 refills | Status: DC | PRN

## 2016-05-28 ENCOUNTER — Other Ambulatory Visit: Payer: Self-pay | Admitting: Pharmacist

## 2016-05-28 MED ORDER — IPRATROPIUM-ALBUTEROL 0.5-2.5 (3) MG/3ML IN SOLN: 3.0000 mL | RESPIRATORY_TRACT | 2 refills | Status: DC | PRN

## 2016-05-28 MED ORDER — ONETOUCH ULTRA SYSTEM W/DEVICE KIT: PACK | 0 refills | Status: DC

## 2016-06-26 ENCOUNTER — Telehealth: Payer: Self-pay | Admitting: Pulmonary Disease

## 2016-06-26 NOTE — Telephone Encounter (Signed)
Samples of Breo have been left at the front desk for pick up. Pt is aware. Nothing further was needed.

## 2016-07-07 ENCOUNTER — Ambulatory Visit: Payer: Medicare Other | Admitting: Internal Medicine

## 2016-07-16 ENCOUNTER — Ambulatory Visit: Payer: Medicare Other | Attending: Internal Medicine | Admitting: Internal Medicine

## 2016-07-16 ENCOUNTER — Encounter: Payer: Self-pay | Admitting: Internal Medicine

## 2016-07-16 ENCOUNTER — Ambulatory Visit: Payer: Medicare Other | Admitting: Internal Medicine

## 2016-07-16 VITALS — BP 156/100 | HR 73 | Temp 98.2°F | Resp 16 | Wt 169.9 lb

## 2016-07-16 DIAGNOSIS — Z23 Encounter for immunization: Secondary | ICD-10-CM | POA: Diagnosis not present

## 2016-07-16 DIAGNOSIS — E7800 Pure hypercholesterolemia, unspecified: Secondary | ICD-10-CM | POA: Diagnosis not present

## 2016-07-16 DIAGNOSIS — J309 Allergic rhinitis, unspecified: Secondary | ICD-10-CM | POA: Insufficient documentation

## 2016-07-16 DIAGNOSIS — I10 Essential (primary) hypertension: Secondary | ICD-10-CM

## 2016-07-16 DIAGNOSIS — Z7984 Long term (current) use of oral hypoglycemic drugs: Secondary | ICD-10-CM | POA: Diagnosis not present

## 2016-07-16 DIAGNOSIS — E1140 Type 2 diabetes mellitus with diabetic neuropathy, unspecified: Secondary | ICD-10-CM | POA: Insufficient documentation

## 2016-07-16 DIAGNOSIS — M549 Dorsalgia, unspecified: Secondary | ICD-10-CM | POA: Diagnosis present

## 2016-07-16 DIAGNOSIS — H6121 Impacted cerumen, right ear: Secondary | ICD-10-CM | POA: Diagnosis not present

## 2016-07-16 DIAGNOSIS — Z7982 Long term (current) use of aspirin: Secondary | ICD-10-CM | POA: Insufficient documentation

## 2016-07-16 DIAGNOSIS — K625 Hemorrhage of anus and rectum: Secondary | ICD-10-CM | POA: Diagnosis present

## 2016-07-16 DIAGNOSIS — J449 Chronic obstructive pulmonary disease, unspecified: Secondary | ICD-10-CM | POA: Insufficient documentation

## 2016-07-16 DIAGNOSIS — G4733 Obstructive sleep apnea (adult) (pediatric): Secondary | ICD-10-CM | POA: Diagnosis not present

## 2016-07-16 DIAGNOSIS — J45909 Unspecified asthma, uncomplicated: Secondary | ICD-10-CM | POA: Insufficient documentation

## 2016-07-16 LAB — POCT URINALYSIS DIPSTICK
Bilirubin, UA: NEGATIVE
Glucose, UA: NEGATIVE
Ketones, UA: NEGATIVE
Leukocytes, UA: NEGATIVE
NITRITE UA: NEGATIVE
PH UA: 5.5 (ref 5.0–8.0)
PROTEIN UA: NEGATIVE
RBC UA: NEGATIVE
Spec Grav, UA: 1.015 (ref 1.030–1.035)
Urobilinogen, UA: 0.2 (ref ?–2.0)

## 2016-07-16 LAB — POCT GLYCOSYLATED HEMOGLOBIN (HGB A1C): HEMOGLOBIN A1C: 5.8

## 2016-07-16 LAB — GLUCOSE, POCT (MANUAL RESULT ENTRY): POC GLUCOSE: 130 mg/dL — AB (ref 70–99)

## 2016-07-16 MED ORDER — FLUTICASONE PROPIONATE 50 MCG/ACT NA SUSP: 2.0000 | Freq: Every day | NASAL | 6 refills | Status: DC

## 2016-07-16 MED ORDER — ASPIRIN EC 81 MG PO TBEC: 81.0000 mg | DELAYED_RELEASE_TABLET | Freq: Every day | ORAL | 2 refills | Status: DC

## 2016-07-16 MED ORDER — GABAPENTIN 300 MG PO CAPS: 300.0000 mg | ORAL_CAPSULE | Freq: Four times a day (QID) | ORAL | 3 refills | Status: DC

## 2016-07-16 MED ORDER — LORATADINE 10 MG PO TABS: 10.0000 mg | ORAL_TABLET | Freq: Every day | ORAL | 11 refills | Status: DC

## 2016-07-16 MED ORDER — METFORMIN HCL ER 500 MG PO TB24: 500.0000 mg | ORAL_TABLET | Freq: Every day | ORAL | 3 refills | Status: DC

## 2016-07-16 MED ORDER — LOSARTAN POTASSIUM-HCTZ 50-12.5 MG PO TABS: 1.0000 | ORAL_TABLET | Freq: Every day | ORAL | 3 refills | Status: DC

## 2016-07-16 MED ORDER — AMLODIPINE BESYLATE 10 MG PO TABS: 10.0000 mg | ORAL_TABLET | Freq: Every day | ORAL | 3 refills | Status: DC

## 2016-07-16 MED ORDER — SALINE SPRAY 0.65 % NA SOLN: 1.0000 | NASAL | 2 refills | Status: DC | PRN

## 2016-07-16 NOTE — Patient Instructions (Addendum)
Carilyn Goodpasture rn bp check 1 - 2 wks -  And Right lavage for right ceruminosis.  -   Low-Sodium Eating Plan Sodium, which is an element that makes up salt, helps you maintain a healthy balance of fluids in your body. Too much sodium can increase your blood pressure and cause fluid and waste to be held in your body. Your health care provider or dietitian may recommend following this plan if you have high blood pressure (hypertension), kidney disease, liver disease, or heart failure. Eating less sodium can help lower your blood pressure, reduce swelling, and protect your heart, liver, and kidneys. What are tips for following this plan? General guidelines   Most people on this plan should limit their sodium intake to 1,500-2,000 mg (milligrams) of sodium each day. Reading food labels   The Nutrition Facts label lists the amount of sodium in one serving of the food. If you eat more than one serving, you must multiply the listed amount of sodium by the number of servings.  Choose foods with less than 140 mg of sodium per serving.  Avoid foods with 300 mg of sodium or more per serving. Shopping   Look for lower-sodium products, often labeled as "low-sodium" or "no salt added."  Always check the sodium content even if foods are labeled as "unsalted" or "no salt added".  Buy fresh foods.  Avoid canned foods and premade or frozen meals.  Avoid canned, cured, or processed meats  Buy breads that have less than 80 mg of sodium per slice. Cooking   Eat more home-cooked food and less restaurant, buffet, and fast food.  Avoid adding salt when cooking. Use salt-free seasonings or herbs instead of table salt or sea salt. Check with your health care provider or pharmacist before using salt substitutes.  Cook with plant-based oils, such as canola, sunflower, or olive oil. Meal planning   When eating at a restaurant, ask that your food be prepared with less salt or no salt, if possible.  Avoid foods  that contain MSG (monosodium glutamate). MSG is sometimes added to [place] food, bouillon, and some canned foods. What foods are recommended? The items listed may not be a complete list. Talk with your dietitian about what dietary choices are best for you. Grains  Low-sodium cereals, including oats, puffed wheat and rice, and shredded wheat. Low-sodium crackers. Unsalted rice. Unsalted pasta. Low-sodium bread. Whole-grain breads and whole-grain pasta. Vegetables  Fresh or frozen vegetables. "No salt added" canned vegetables. "No salt added" tomato sauce and paste. Low-sodium or reduced-sodium tomato and vegetable juice. Fruits  Fresh, frozen, or canned fruit. Fruit juice. Meats and other protein foods  Fresh or frozen (no salt added) meat, poultry, seafood, and fish. Low-sodium canned tuna and salmon. Unsalted nuts. Dried peas, beans, and lentils without added salt. Unsalted canned beans. Eggs. Unsalted nut butters. Dairy  Milk. Soy milk. Cheese that is naturally low in sodium, such as ricotta cheese, fresh mozzarella, or Swiss cheese Low-sodium or reduced-sodium cheese. Cream cheese. Yogurt. Fats and oils  Unsalted butter. Unsalted margarine with no trans fat. Vegetable oils such as canola or olive oils. Seasonings and other foods  Fresh and dried herbs and spices. Salt-free seasonings. Low-sodium mustard and ketchup. Sodium-free salad dressing. Sodium-free light mayonnaise. Fresh or refrigerated horseradish. Lemon juice. Vinegar. Homemade, reduced-sodium, or low-sodium soups. Unsalted popcorn and pretzels. Low-salt or salt-free chips. What foods are not recommended? The items listed may not be a complete list. Talk with your dietitian about what dietary  choices are best for you. Grains  Instant hot cereals. Bread stuffing, pancake, and biscuit mixes. Croutons. Seasoned rice or pasta mixes. Noodle soup cups. Boxed or frozen macaroni and cheese. Regular salted crackers. Self-rising  flour. Vegetables  Sauerkraut, pickled vegetables, and relishes. Olives. [place] fries. Onion rings. Regular canned vegetables (not low-sodium or reduced-sodium). Regular canned tomato sauce and paste (not low-sodium or reduced-sodium). Regular tomato and vegetable juice (not low-sodium or reduced-sodium). Frozen vegetables in sauces. Meats and other protein foods  Meat or fish that is salted, canned, smoked, spiced, or pickled. Bacon, ham, sausage, hotdogs, corned beef, chipped beef, packaged lunch meats, salt pork, jerky, pickled herring, anchovies, regular canned tuna, sardines, salted nuts. Dairy  Processed cheese and cheese spreads. Cheese curds. Blue cheese. Feta cheese. String cheese. Regular cottage cheese. Buttermilk. Canned milk. Fats and oils  Salted butter. Regular margarine. Ghee. Bacon fat. Seasonings and other foods  Onion salt, garlic salt, seasoned salt, table salt, and sea salt. Canned and packaged gravies. Worcestershire sauce. Tartar sauce. Barbecue sauce. Teriyaki sauce. Soy sauce, including reduced-sodium. Steak sauce. Fish sauce. Oyster sauce. Cocktail sauce. Horseradish that you find on the shelf. Regular ketchup and mustard. Meat flavorings and tenderizers. Bouillon cubes. Hot sauce and Tabasco sauce. Premade or packaged marinades. Premade or packaged taco seasonings. Relishes. Regular salad dressings. Salsa. Potato and tortilla chips. Corn chips and puffs. Salted popcorn and pretzels. Canned or dried soups. Pizza. Frozen entrees and pot pies. Summary  Eating less sodium can help lower your blood pressure, reduce swelling, and protect your heart, liver, and kidneys.  Most people on this plan should limit their sodium intake to 1,500-2,000 mg (milligrams) of sodium each day.  Canned, boxed, and frozen foods are high in sodium. Restaurant foods, fast foods, and pizza are also very high in sodium. You also get sodium by adding salt to food.  Try to cook at home, eat more fresh  fruits and vegetables, and eat less fast food, canned, processed, or prepared foods. This information is not intended to replace advice given to you by your health care provider. Make sure you discuss any questions you have with your health care provider. Document Released: 10/04/2001 Document Revised: 04/07/2016 Document Reviewed: 04/07/2016 Elsevier Interactive Patient Education  2017 [employer]  -  Allergic Rhinitis Allergic rhinitis is when the mucous membranes in the nose respond to allergens. Allergens are particles in the air that cause your body to have an allergic reaction. This causes you to release allergic antibodies. Through a chain of events, these eventually cause you to release histamine into the blood stream. Although meant to protect the body, it is this release of histamine that causes your discomfort, such as frequent sneezing, congestion, and an itchy, runny nose. What are the causes? Seasonal allergic rhinitis (hay fever) is caused by pollen allergens that may come from grasses, trees, and weeds. Year-round allergic rhinitis (perennial allergic rhinitis) is caused by allergens such as house dust mites, pet dander, and mold spores. What are the signs or symptoms?  Nasal stuffiness (congestion).  Itchy, runny nose with sneezing and tearing of the eyes. How is this diagnosed? Your health care provider can help you determine the allergen or allergens that trigger your symptoms. If you and your health care provider are unable to determine the allergen, skin or blood testing may be used. Your health care provider will diagnose your condition after taking your health history and performing a physical exam. Your health care provider may assess you for other  related conditions, such as asthma, pink eye, or an ear infection. How is this treated? Allergic rhinitis does not have a cure, but it can be controlled by:  Medicines that block allergy symptoms. These may include allergy  shots, nasal sprays, and oral antihistamines.  Avoiding the allergen. Hay fever may often be treated with antihistamines in pill or nasal spray forms. Antihistamines block the effects of histamine. There are over-the-counter medicines that may help with nasal congestion and swelling around the eyes. Check with your health care provider before taking or giving this medicine. If avoiding the allergen or the medicine prescribed do not work, there are many new medicines your health care provider can prescribe. Stronger medicine may be used if initial measures are ineffective. Desensitizing injections can be used if medicine and avoidance does not work. Desensitization is when a patient is given ongoing shots until the body becomes less sensitive to the allergen. Make sure you follow up with your health care provider if problems continue. Follow these instructions at home: It is not possible to completely avoid allergens, but you can reduce your symptoms by taking steps to limit your exposure to them. It helps to know exactly what you are allergic to so that you can avoid your specific triggers. Contact a health care provider if:  You have a fever.  You develop a cough that does not stop easily (persistent).  You have shortness of breath.  You start wheezing.  Symptoms interfere with normal daily activities. This information is not intended to replace advice given to you by your health care provider. Make sure you discuss any questions you have with your health care provider. Document Released: 01/07/2001 Document Revised: 12/14/2015 Document Reviewed: 12/20/2012 Elsevier Interactive Patient Education  2017 [employer].

## 2016-07-16 NOTE — Progress Notes (Signed)
Christian [name], is a 56 y.o. male  ZOX:[id number]  WJX:[id number]  DOB - February 26, 1961  Chief Complaint  Patient presents with  . Knee Pain  . Back Pain  . Rectal Bleeding        Subjective:   Christian [name] is a 56 y.o. male here today for a follow up visit, last seen 04/02/16 in clinic, w/ signif pmhx of htn, dm, asthma, never smoked, has osa on home cpap, and chronic pain syndrome. He was seen by Pulm on 05/07/16 as well, currently on Breo per pulm, and  Prn mucinex/flonase nasal spray for congestion.  He states he was feeling ok, getting over his chest cold about month ago, when had 1 episode of transient painless hematuria, denied dysuria at time. He had seen Uro years ago, and treated for uti at time. Currently denies hematuria.    He states for last 1 wk or so, more congested nasally, when he blows his nose, notes some tinges of brown blood. Clear prod cough at times at night w/ chills. Denies fevers/night sweats/sob.  Forgot to take his bp meds this am. Taking all other meds w/o problems including metformin.  Patient has No headache, No chest pain, No abdominal pain - No Nausea, No new weakness tingling or numbness.  Denies bilat ear pains/discharge.  w/ his young grandson today.  No problems updated.  ALLERGIES: Allergies  Allergen Reactions  . Phenergan [Promethazine Hcl] Itching    PAST MEDICAL HISTORY: Past Medical History:  Diagnosis Date  . Arthritis    "left knee" (01/22/2016)  . Asthma   . Chronic lower back pain   . COPD (chronic obstructive pulmonary disease) ([place])   . High cholesterol   . Hypertension   . Neuropathy ([place])   . OSA on CPAP   . Secondhand smoke exposure   . Type II diabetes mellitus ([place]) dx'd 01/17/2016    MEDICATIONS AT HOME: Prior to Admission medications   Medication Sig Start Date End Date Taking? Authorizing Provider  albuterol (VENTOLIN HFA) 108 (90 Base) MCG/ACT inhaler Inhale 2 puffs into the lungs every 6 (six) hours as needed for  wheezing or shortness of breath. 04/15/16   Maren Reamer, MD  amLODipine (NORVASC) 10 MG tablet Take 1 tablet (10 mg total) by mouth daily. 07/16/16   Maren Reamer, MD  aspirin EC 81 MG tablet Take 1 tablet (81 mg total) by mouth daily. 07/16/16   Maren Reamer, MD  Blood Glucose Monitoring Suppl (ONE TOUCH ULTRA SYSTEM KIT) w/Device KIT Use as directed 05/28/16   Maren Reamer, MD  carisoprodol (SOMA) 350 MG tablet Take 1 tablet (350 mg total) by mouth 4 (four) times daily as needed for muscle spasms. 05/26/16   Maren Reamer, MD  fluticasone (FLONASE) 50 MCG/ACT nasal spray Place 2 sprays into both nostrils daily. 07/16/16   Maren Reamer, MD  fluticasone furoate-vilanterol (BREO ELLIPTA) 100-25 MCG/INH AEPB Inhale 1 puff into the lungs daily. 05/07/16   Tammy S Parrett, NP  gabapentin (NEURONTIN) 300 MG capsule Take 1 capsule (300 mg total) by mouth 4 (four) times daily. 07/16/16   Maren Reamer, MD  glucose blood (TRUE METRIX BLOOD GLUCOSE TEST) test strip Use as instructed 02/01/16   Maren Reamer, MD  ipratropium-albuterol (DUONEB) 0.5-2.5 (3) MG/3ML SOLN Take 3 mLs by nebulization every 4 (four) hours as needed. 05/28/16   Maren Reamer, MD  loratadine (CLARITIN) 10 MG tablet Take 1 tablet (10 mg total)  by mouth daily. 07/16/16   Maren Reamer, MD  losartan-hydrochlorothiazide (HYZAAR) 50-12.5 MG tablet Take 1 tablet by mouth daily. 07/16/16   Maren Reamer, MD  meloxicam (MOBIC) 15 MG tablet Take 1 tablet (15 mg total) by mouth daily. Take with food 05/26/16   Maren Reamer, MD  metFORMIN (GLUCOPHAGE XR) 500 MG 24 hr tablet Take 1 tablet (500 mg total) by mouth daily with breakfast. 07/16/16   Maren Reamer, MD  naproxen (NAPROSYN) 500 MG tablet Take 1 tablet (500 mg total) by mouth 3 (three) times daily with meals. 04/15/16   Maren Reamer, MD  simvastatin (ZOCOR) 40 MG tablet Take 1 tablet (40 mg total) by mouth daily. 05/22/16   Maren Reamer, MD  sodium  chloride (OCEAN) 0.65 % SOLN nasal spray Place 1 spray into both nostrils as needed for congestion. 07/16/16   Maren Reamer, MD  TRUEPLUS LANCETS 28G MISC Check glucose 2 times a day 02/01/16   Maren Reamer, MD     Objective:   Vitals:   07/16/16 1133  BP: (!) 156/100  Pulse: 73  Resp: 16  Temp: 98.2 F (36.8 C)  TempSrc: Oral  SpO2: 95%  Weight: 169 lb 14.4 oz (77.1 kg)    Exam General appearance : Awake, alert, not in any distress. Speech Clear. Not toxic looking, pleasant HEENT: Atraumatic and Normocephalic, pupils equally reactive to light.  Boggy nares bilat.  No facial/max sinus ttp.  Left TM clear w/ mild ceruminosis; right ear w/ total ceruminosis impaction.   Neck: supple, no JVD. No cervical lymphadenopathy.  Chest:Good air entry bilaterally, no added sounds. No w/c/r. CVS: S1 S2 regular, no murmurs/gallups or rubs. Abdomen: Bowel sounds active, Non tender and not distended with no gaurding, rigidity or rebound. Extremities: B/L Lower Ext shows no edema, both legs are warm to touch Neurology: Awake alert, and oriented X 3, CN II-XII grossly intact, Non focal Skin:No Rash  Data Review Lab Results  Component Value Date   HGBA1C 5.8 07/16/2016   HGBA1C 6.5 01/14/2016    Depression screen [facility] 2/9 07/16/2016 04/02/2016  Decreased Interest 0 3  Down, Depressed, Hopeless 0 0  PHQ - 2 Score 0 3  Altered sleeping - 0  Tired, decreased energy - 0  Change in appetite - 0  Feeling bad or failure about yourself  - 0  Trouble concentrating - 0  Moving slowly or fidgety/restless - 0  Suicidal thoughts - 0  PHQ-9 Score - 3    UA - negative.  Assessment & Plan   1. Type 2 diabetes mellitus with diabetic neuropathy, without long-term current use of insulin ([place]) - doing well on metformin 500 qd, continue as tol., currently in Prediabetes range. - POCT glucose (manual entry) - POCT glycosylated hemoglobin (Hb A1C)  5.8 (from 6.5) - POCT urinalysis dipstick  2.  Ceruminosis, right - fu w/ rn visit in 1-2 weeks for ear lavage.  3. Acute allergic rhinitis, unspecified seasonality, unspecified trigger Suspected, afebrile, no acute signs of sinusitis currently - trial sx relief, added claritin, saline nasal spray, and flonase renewed - recd humidifier if can afford, if not a pot of water in corner of room could also help humidify the air slowly. - instructed to call me in 1-2 wks if no improvement and sxs worsen.  4. Essential hypertension Uncontrolled, forgot to take his bp meds this am. Encouraged compliance, low salt diet - same bp rx for  Now, norvasc  10 qd, hyzaar 50-12.5 qd - fu w/ RN Carilyn Goodpasture in 1-2 wks for bp check, if sbp remains >130, than increase hyzaar to 100-25 qd.  5. Intrinsic asthma On Breo, appears stable today, defer to pulm. - pneumococcal 23v today to complete series.  6. Hx of hematuria, painless recently - psa 0.5 (04/02/16) - ua today unremarkable. - if recurs, Uro c/s.   Patient have been counseled extensively about nutrition and exercise  Return in about 3 months (around 10/16/2016), or if symptoms worsen or fail to improve.  The patient was given clear instructions to go to ER or return to medical center if symptoms don't improve, worsen or new problems develop. The patient verbalized understanding. The patient was told to call to get lab results if they haven't heard anything in the next week.   This note has been created with Surveyor, quantity. Any transcriptional errors are unintentional.   Maren Reamer, MD, [facility] [place], [place]   07/16/2016, 12:10 PM

## 2016-07-21 ENCOUNTER — Other Ambulatory Visit: Payer: Self-pay | Admitting: Internal Medicine

## 2016-07-21 MED ORDER — PRAVASTATIN SODIUM 20 MG PO TABS: 20.0000 mg | ORAL_TABLET | Freq: Every day | ORAL | 3 refills | Status: DC

## 2016-07-22 ENCOUNTER — Telehealth: Payer: Self-pay | Admitting: Internal Medicine

## 2016-07-22 NOTE — Telephone Encounter (Signed)
Will forward to pcp

## 2016-07-22 NOTE — Telephone Encounter (Signed)
Patient came by the office asking to speak with PCP regarding an antibiotic that patient needs for cough. Pt stated that on his last visit PCP informed him that if he continued with cough that she would call a prescription in. Please call medication to [facility] on [address].   Please follow up.  Thank you.

## 2016-07-23 MED ORDER — AZITHROMYCIN 250 MG PO TABS: ORAL_TABLET | ORAL | 0 refills | Status: DC

## 2016-07-23 NOTE — Telephone Encounter (Signed)
Called pt, confirmed dob. Still w/ yellow sputum production/cough, not improved. rx zpack.

## 2016-08-11 ENCOUNTER — Telehealth: Payer: Self-pay | Admitting: Pulmonary Disease

## 2016-08-11 MED ORDER — FLUTICASONE FUROATE-VILANTEROL 100-25 MCG/INH IN AEPB: 1.0000 | INHALATION_SPRAY | Freq: Every day | RESPIRATORY_TRACT | 0 refills | Status: DC

## 2016-08-11 NOTE — Telephone Encounter (Signed)
lmtcb X1 for pt. Samples placed up for pt.

## 2016-08-13 NOTE — Telephone Encounter (Signed)
lmtcb x2 for pt. 

## 2016-08-13 NOTE — Telephone Encounter (Signed)
Patient called back and was advised samples are up front ready to be picked up.  Advised whoever picks these up must bring Driver's License.  No call back is required.

## 2016-08-25 ENCOUNTER — Encounter (HOSPITAL_COMMUNITY): Payer: Self-pay

## 2016-08-25 ENCOUNTER — Emergency Department (HOSPITAL_COMMUNITY)
Admission: EM | Admit: 2016-08-25 | Discharge: 2016-08-25 | Disposition: A | Discharge status: Home / Self Care | Payer: Medicare Other | Attending: Emergency Medicine | Admitting: Emergency Medicine

## 2016-08-25 ENCOUNTER — Emergency Department (HOSPITAL_COMMUNITY): Payer: Medicare Other

## 2016-08-25 DIAGNOSIS — Z7982 Long term (current) use of aspirin: Secondary | ICD-10-CM | POA: Insufficient documentation

## 2016-08-25 DIAGNOSIS — E1140 Type 2 diabetes mellitus with diabetic neuropathy, unspecified: Secondary | ICD-10-CM | POA: Insufficient documentation

## 2016-08-25 DIAGNOSIS — J441 Chronic obstructive pulmonary disease with (acute) exacerbation: Secondary | ICD-10-CM | POA: Insufficient documentation

## 2016-08-25 DIAGNOSIS — I10 Essential (primary) hypertension: Secondary | ICD-10-CM | POA: Insufficient documentation

## 2016-08-25 DIAGNOSIS — R0602 Shortness of breath: Secondary | ICD-10-CM | POA: Diagnosis present

## 2016-08-25 DIAGNOSIS — Z7984 Long term (current) use of oral hypoglycemic drugs: Secondary | ICD-10-CM | POA: Insufficient documentation

## 2016-08-25 DIAGNOSIS — Z79899 Other long term (current) drug therapy: Secondary | ICD-10-CM | POA: Insufficient documentation

## 2016-08-25 DIAGNOSIS — Z7722 Contact with and (suspected) exposure to environmental tobacco smoke (acute) (chronic): Secondary | ICD-10-CM | POA: Insufficient documentation

## 2016-08-25 LAB — BASIC METABOLIC PANEL
ANION GAP: 6 (ref 5–15)
BUN: 9 mg/dL (ref 6–20)
CALCIUM: 8.8 mg/dL — AB (ref 8.9–10.3)
CO2: 25 mmol/L (ref 22–32)
Chloride: 109 mmol/L (ref 101–111)
Creatinine, Ser: 1.16 mg/dL (ref 0.61–1.24)
GFR calc non Af Amer: >60 mL/min (ref >60)
GLUCOSE: 91 mg/dL (ref 65–99)
POTASSIUM: 3.9 mmol/L (ref 3.5–5.1)
Sodium: 140 mmol/L (ref 135–145)

## 2016-08-25 LAB — CBC
HEMATOCRIT: 41.7 % (ref 39.0–52.0)
HEMOGLOBIN: 14 g/dL (ref 13.0–17.0)
MCH: 28.9 pg (ref 26.0–34.0)
MCHC: 33.6 g/dL (ref 30.0–36.0)
MCV: 86 fL (ref 78.0–100.0)
Platelets: 175 10*3/uL (ref 150–400)
RBC: 4.85 MIL/uL (ref 4.22–5.81)
RDW: 13.9 % (ref 11.5–15.5)
WBC: 3.4 10*3/uL — ABNORMAL LOW (ref 4.0–10.5)

## 2016-08-25 LAB — I-STAT TROPONIN, ED: TROPONIN I, POC: 0 ng/mL (ref 0.00–0.08)

## 2016-08-25 MED ORDER — ALBUTEROL (5 MG/ML) CONTINUOUS INHALATION SOLN
10.0000 mg/h | INHALATION_SOLUTION | Freq: Once | RESPIRATORY_TRACT | Status: AC
  Administered 2016-08-25: 10 mg/h via RESPIRATORY_TRACT
  Filled 2016-08-25: qty 20

## 2016-08-25 MED ORDER — DOXYCYCLINE HYCLATE 100 MG PO CAPS: 100.0000 mg | ORAL_CAPSULE | Freq: Two times a day (BID) | ORAL | 0 refills | Status: DC

## 2016-08-25 MED ORDER — ALBUTEROL SULFATE (2.5 MG/3ML) 0.083% IN NEBU
5.0000 mg | INHALATION_SOLUTION | Freq: Once | RESPIRATORY_TRACT | Status: AC
  Administered 2016-08-25: 5 mg via RESPIRATORY_TRACT
  Filled 2016-08-25: qty 6

## 2016-08-25 MED ORDER — PREDNISONE 20 MG PO TABS
60.0000 mg | ORAL_TABLET | Freq: Once | ORAL | Status: AC
  Administered 2016-08-25: 60 mg via ORAL
  Filled 2016-08-25: qty 3

## 2016-08-25 MED ORDER — DEXTROSE 5 % IV SOLN: 300.0000 mg | Freq: Once | INTRAVENOUS | Status: DC

## 2016-08-25 NOTE — ED Notes (Addendum)
While ambulating pt, pt's O2 was between 87%-93%. Pt's O2 primarily stayed at 92%. Pt's pulse was 84-86 and respirations were 19. Pt complained of "wheezing". Dr. Winfred Leeds and Levada Dy - RN informed.

## 2016-08-25 NOTE — ED Triage Notes (Signed)
Per Pt, Pt is coming from home with SOB and Chest pain x 2 days with a productive cough. Pt reports having yellow sputum.

## 2016-08-25 NOTE — ED Notes (Signed)
Pt requesting food

## 2016-08-25 NOTE — ED Notes (Signed)
Pt given prepacked bag lunch ([place] sandwich & applesauce) and ice water, per Levada Dy - RN.

## 2016-08-25 NOTE — Discharge Instructions (Signed)
Use your home nebulizer every 4 hours as needed for shortness of breath of cough. Return if needed more than every 4 hours or contact Dr.Langeland

## 2016-08-25 NOTE — ED Provider Notes (Signed)
[facility] DEPT Provider Note   CSN: [id number] Arrival date & time: 08/25/16  0848     History   Chief Complaint Chief Complaint  Patient presents with  . Shortness of Breath    HPI Christian [name] is a 56 y.o. male.Complains of shortness of breath cough productive of yellow sputum onset 3 days ago accompanied by anterior chest pain right-sided parasternal which is worse with changing positions and improved with remaining still. Chest pain also has a pleuritic component. He is uncertain if he's had fever, but he does admit to chills. Treated himself with nebulized treatment without appreciable relief. No other associated symptoms  HPI  Past Medical History:  Diagnosis Date  . Arthritis    "left knee" (01/22/2016)  . Asthma   . Chronic lower back pain   . COPD (chronic obstructive pulmonary disease) ([place])   . High cholesterol   . Hypertension   . Neuropathy   . OSA on CPAP   . Secondhand smoke exposure   . Type II diabetes mellitus ([place]) dx'd 01/17/2016    Patient Active Problem List   Diagnosis Date Noted  . Chronic pain syndrome 04/02/2016  . Intrinsic asthma 03/25/2016  . Asthma with acute exacerbation 01/24/2016  . Essential hypertension 01/23/2016  . Hyperlipidemia 01/23/2016  . Secondhand smoke exposure 01/23/2016  . Diabetes mellitus with neuropathy ([place]) 01/23/2016  . Acute bronchitis   . Musculoskeletal chest pain 01/22/2016    Past Surgical History:  Procedure Laterality Date  . ANTERIOR CERVICAL DECOMP/DISCECTOMY FUSION  ~ 1995   "titanium"  . APPENDECTOMY  ~ 1977  . BACK SURGERY    . INGUINAL HERNIA REPAIR Right ~ 2000  . LUMBAR DISC SURGERY  ~ 2000; ~2006  . NASAL SINUS SURGERY  ~ 2007       Home Medications    Prior to Admission medications   Medication Sig Start Date End Date Taking? Authorizing Provider  albuterol (VENTOLIN HFA) 108 (90 Base) MCG/ACT inhaler Inhale 2 puffs into the lungs every 6 (six) hours as needed for wheezing or  shortness of breath. 04/15/16   Maren Reamer, MD  amLODipine (NORVASC) 10 MG tablet Take 1 tablet (10 mg total) by mouth daily. 07/16/16   Maren Reamer, MD  aspirin EC 81 MG tablet Take 1 tablet (81 mg total) by mouth daily. 07/16/16   Maren Reamer, MD  azithromycin (ZITHROMAX) 250 MG tablet Day 1 take 2 tabs azithromycin, than take daily 07/23/16   Maren Reamer, MD  Blood Glucose Monitoring Suppl (ONE TOUCH ULTRA SYSTEM KIT) w/Device KIT Use as directed 05/28/16   Maren Reamer, MD  carisoprodol (SOMA) 350 MG tablet Take 1 tablet (350 mg total) by mouth 4 (four) times daily as needed for muscle spasms. 05/26/16   Maren Reamer, MD  fluticasone (FLONASE) 50 MCG/ACT nasal spray Place 2 sprays into both nostrils daily. 07/16/16   Maren Reamer, MD  fluticasone furoate-vilanterol (BREO ELLIPTA) 100-25 MCG/INH AEPB Inhale 1 puff into the lungs daily. 05/07/16   Tammy S Parrett, NP  fluticasone furoate-vilanterol (BREO ELLIPTA) 100-25 MCG/INH AEPB Inhale 1 puff into the lungs daily. 08/11/16   Juanito Doom, MD  gabapentin (NEURONTIN) 300 MG capsule Take 1 capsule (300 mg total) by mouth 4 (four) times daily. 07/16/16   Maren Reamer, MD  glucose blood (TRUE METRIX BLOOD GLUCOSE TEST) test strip Use as instructed 02/01/16   Maren Reamer, MD  ipratropium-albuterol (DUONEB) 0.5-2.5 (3) MG/3ML SOLN  Take 3 mLs by nebulization every 4 (four) hours as needed. 05/28/16   Maren Reamer, MD  loratadine (CLARITIN) 10 MG tablet Take 1 tablet (10 mg total) by mouth daily. 07/16/16   Maren Reamer, MD  losartan-hydrochlorothiazide (HYZAAR) 50-12.5 MG tablet Take 1 tablet by mouth daily. 07/16/16   Maren Reamer, MD  meloxicam (MOBIC) 15 MG tablet Take 1 tablet (15 mg total) by mouth daily. Take with food 05/26/16   Maren Reamer, MD  metFORMIN (GLUCOPHAGE XR) 500 MG 24 hr tablet Take 1 tablet (500 mg total) by mouth daily with breakfast. 07/16/16   Maren Reamer, MD  naproxen  (NAPROSYN) 500 MG tablet Take 1 tablet (500 mg total) by mouth 3 (three) times daily with meals. 04/15/16   Maren Reamer, MD  pravastatin (PRAVACHOL) 20 MG tablet Take 1 tablet (20 mg total) by mouth daily. 07/21/16   Maren Reamer, MD  sodium chloride (OCEAN) 0.65 % SOLN nasal spray Place 1 spray into both nostrils as needed for congestion. 07/16/16   Maren Reamer, MD  TRUEPLUS LANCETS 28G MISC Check glucose 2 times a day 02/01/16   Maren Reamer, MD    Family History Family History  Problem Relation Age of Onset  . Hypertension Mother   . Hypertension Father   . Heart Problems Brother     Heart transplant, pt does not know cause     Social History Social History  Substance Use Topics  . Smoking status: Passive Smoke Exposure - Never Smoker  . Smokeless tobacco: Never Used  . Alcohol use No   Results for orders placed or performed during the hospital encounter of 16/10/96  Basic metabolic panel  Result Value Ref Range   Sodium 140 135 - 145 mmol/L   Potassium 3.9 3.5 - 5.1 mmol/L   Chloride 109 101 - 111 mmol/L   CO2 25 22 - 32 mmol/L   Glucose, Bld 91 65 - 99 mg/dL   BUN 9 6 - 20 mg/dL   Creatinine, Ser 1.16 0.61 - 1.24 mg/dL   Calcium 8.8 (L) 8.9 - 10.3 mg/dL   GFR calc non Af Amer >60 >60 mL/min   GFR calc Af Amer >60 >60 mL/min   Anion gap 6 5 - 15  CBC  Result Value Ref Range   WBC 3.4 (L) 4.0 - 10.5 K/uL   RBC 4.85 4.22 - 5.81 MIL/uL   Hemoglobin 14.0 13.0 - 17.0 g/dL   HCT 41.7 39.0 - 52.0 %   MCV 86.0 78.0 - 100.0 fL   MCH 28.9 26.0 - 34.0 pg   MCHC 33.6 30.0 - 36.0 g/dL   RDW 13.9 11.5 - 15.5 %   Platelets 175 150 - 400 K/uL  I-stat troponin, ED  Result Value Ref Range   Troponin i, poc 0.00 0.00 - 0.08 ng/mL   Comment 3           Dg Chest 2 View  Result Date: 08/25/2016 CLINICAL DATA:  Shortness of breath and LEFT chest pain for 2 days, productive cough, chills, asthma, COPD, type II diabetes mellitus EXAM: CHEST  2 VIEW COMPARISON:   01/21/2016 FINDINGS: Normal heart size, mediastinal contours, and pulmonary vascularity. Lungs clear. No pleural effusion or pneumothorax. Bones unremarkable. IMPRESSION: Normal exam. Electronically Signed   By: Lavonia Dana M.D.   On: 08/25/2016 10:46    Allergies   Phenergan [promethazine hcl]   Review of Systems Review of Systems  Constitutional:  Positive for chills.  HENT: Negative.   Respiratory: Positive for cough, shortness of breath and wheezing.   Cardiovascular: Negative.   Gastrointestinal: Negative.   Musculoskeletal: Negative.   Skin: Negative.   Allergic/Immunologic: Positive for immunocompromised state.       Diabetic  Neurological: Negative.   Psychiatric/Behavioral: Negative.   All other systems reviewed and are negative.    Physical Exam Updated Vital Signs BP (!) 159/96 (BP Location: Left Arm)   Pulse 72   Temp 98 F (36.7 C) (Oral)   Resp 18   Ht '5\' 10"'$  (1.778 m)   Wt 171 lb (77.6 kg)   SpO2 97%   BMI 24.54 kg/m   Physical Exam  Constitutional: He appears well-developed and well-nourished.  HENT:  Head: Normocephalic and atraumatic.  Eyes: Conjunctivae are normal. Pupils are equal, round, and reactive to light.  Neck: Neck supple. No tracheal deviation present. No thyromegaly present.  Cardiovascular: Normal rate and regular rhythm.   No murmur heard. Pulmonary/Chest: Effort normal and breath sounds normal.  Abdominal: Soft. Bowel sounds are normal. He exhibits no distension. There is no tenderness.  Musculoskeletal: Normal range of motion. He exhibits no edema or tenderness.  Neurological: He is alert. Coordination normal.  Skin: Skin is warm and dry. No rash noted.  Psychiatric: He has a normal mood and affect.  Nursing note and vitals reviewed.    ED Treatments / Results  Labs (all labs ordered are listed, but only abnormal results are displayed) Labs Reviewed  [place], ED    EKG  EKG  Interpretation  Date/Time:  Monday August 25 2016 08:54:25 EDT Ventricular Rate:  75 PR Interval:  234 QRS Duration: 78 QT Interval:  340 QTC Calculation: 379 R Axis:   65 Text Interpretation:  Sinus rhythm with 1st degree A-V block Septal infarct , age undetermined Abnormal ECG No significant change since last tracing Confirmed by Winfred Leeds  MD, Mariya Mottley [phone]) on 08/25/2016 9:12:02 AM       Radiology No results found.  Procedures Procedures (including critical care time)  Medications Ordered in ED Medications  albuterol (PROVENTIL) (2.5 MG/3ML) 0.083% nebulizer solution 5 mg (not administered)     Initial Impression / Assessment and Plan / ED Course  I have reviewed the triage vital signs and the nursing notes.  Pertinent labs & imaging results that were available during my care of the patient were reviewed by me and considered in my medical decision making (see chart for details).     Chest x-ray viewed by me. Patient states he did not get relief after nebulized treatment. He appears in no respiratory distress. He speaks in paragraphs lungs with scant diffuse rhonchi. He speaks in paragraphs. Continuous nebulization and prednisone ordred 2: 30 p.m. patient states his breathing is almost at baseline after continuous nebulization and oral prednisone administered. He is able to ambulate around the emergency department without difficulty and without desaturation of pulse ox. Plan prescription doxycycline. Follow-up the [facility] as needed.. Use DuoNeb every 4 hours return if needed more than every 4 hours.  Final Clinical Impressions(s) / ED Diagnoses  Diagnosis #1 COPD exacerbation  Final diagnoses:  None    New Prescriptions New Prescriptions   No medications on file     Orlie Dakin, MD 08/25/16 1442

## 2016-09-02 ENCOUNTER — Other Ambulatory Visit: Payer: Self-pay | Admitting: Pharmacist

## 2016-09-02 MED ORDER — ALBUTEROL SULFATE HFA 108 (90 BASE) MCG/ACT IN AERS: 1.0000 | INHALATION_SPRAY | Freq: Four times a day (QID) | RESPIRATORY_TRACT | 2 refills | Status: DC | PRN

## 2016-09-11 ENCOUNTER — Telehealth: Payer: Self-pay | Admitting: Pulmonary Disease

## 2016-09-11 ENCOUNTER — Encounter: Payer: Self-pay | Admitting: Internal Medicine

## 2016-09-11 NOTE — Telephone Encounter (Signed)
We do not have any Breo samples  LMTCB

## 2016-09-12 NOTE — Telephone Encounter (Signed)
I have called the pt back and advised him that he would need to call back later on Monday to see if we have any of these samples in the office.

## 2016-09-12 NOTE — Telephone Encounter (Signed)
Patient returned phone call, advised no BREO samples, patient would like to know if samples will be avail between now and Monday, and call if we get some soon..pt contact # [phone]...ert

## 2016-09-12 NOTE — Telephone Encounter (Signed)
lmomtcb x 2  

## 2016-09-15 ENCOUNTER — Telehealth: Payer: Self-pay | Admitting: Pulmonary Disease

## 2016-09-15 ENCOUNTER — Ambulatory Visit: Payer: Medicare Other | Admitting: Pulmonary Disease

## 2016-09-15 ENCOUNTER — Ambulatory Visit: Payer: Medicare Other | Attending: Internal Medicine | Admitting: Internal Medicine

## 2016-09-15 ENCOUNTER — Encounter: Payer: Self-pay | Admitting: Internal Medicine

## 2016-09-15 VITALS — BP 127/84 | HR 65 | Temp 98.0°F | Resp 16 | Wt 171.6 lb

## 2016-09-15 DIAGNOSIS — J449 Chronic obstructive pulmonary disease, unspecified: Secondary | ICD-10-CM | POA: Insufficient documentation

## 2016-09-15 DIAGNOSIS — E7800 Pure hypercholesterolemia, unspecified: Secondary | ICD-10-CM | POA: Diagnosis not present

## 2016-09-15 DIAGNOSIS — Z7984 Long term (current) use of oral hypoglycemic drugs: Secondary | ICD-10-CM | POA: Diagnosis not present

## 2016-09-15 DIAGNOSIS — Z7722 Contact with and (suspected) exposure to environmental tobacco smoke (acute) (chronic): Secondary | ICD-10-CM | POA: Diagnosis not present

## 2016-09-15 DIAGNOSIS — Z888 Allergy status to other drugs, medicaments and biological substances status: Secondary | ICD-10-CM | POA: Insufficient documentation

## 2016-09-15 DIAGNOSIS — G894 Chronic pain syndrome: Secondary | ICD-10-CM | POA: Insufficient documentation

## 2016-09-15 DIAGNOSIS — R7303 Prediabetes: Secondary | ICD-10-CM | POA: Diagnosis not present

## 2016-09-15 DIAGNOSIS — I10 Essential (primary) hypertension: Secondary | ICD-10-CM

## 2016-09-15 DIAGNOSIS — E1140 Type 2 diabetes mellitus with diabetic neuropathy, unspecified: Secondary | ICD-10-CM | POA: Diagnosis not present

## 2016-09-15 DIAGNOSIS — J4541 Moderate persistent asthma with (acute) exacerbation: Secondary | ICD-10-CM | POA: Insufficient documentation

## 2016-09-15 DIAGNOSIS — Z79899 Other long term (current) drug therapy: Secondary | ICD-10-CM | POA: Diagnosis not present

## 2016-09-15 DIAGNOSIS — Z7982 Long term (current) use of aspirin: Secondary | ICD-10-CM | POA: Insufficient documentation

## 2016-09-15 DIAGNOSIS — Z7951 Long term (current) use of inhaled steroids: Secondary | ICD-10-CM | POA: Diagnosis not present

## 2016-09-15 DIAGNOSIS — G4733 Obstructive sleep apnea (adult) (pediatric): Secondary | ICD-10-CM | POA: Diagnosis not present

## 2016-09-15 LAB — GLUCOSE, POCT (MANUAL RESULT ENTRY): POC Glucose: 101 mg/dl — AB (ref 70–99)

## 2016-09-15 MED ORDER — GABAPENTIN 300 MG PO CAPS: 300.0000 mg | ORAL_CAPSULE | Freq: Four times a day (QID) | ORAL | 3 refills | Status: DC

## 2016-09-15 MED ORDER — FLUTICASONE FUROATE-VILANTEROL 100-25 MCG/INH IN AEPB: 1.0000 | INHALATION_SPRAY | Freq: Every day | RESPIRATORY_TRACT | 0 refills | Status: DC

## 2016-09-15 MED ORDER — LORATADINE 10 MG PO TABS: 10.0000 mg | ORAL_TABLET | Freq: Every day | ORAL | 11 refills | Status: DC

## 2016-09-15 MED ORDER — AMLODIPINE BESYLATE 10 MG PO TABS: 10.0000 mg | ORAL_TABLET | Freq: Every day | ORAL | 3 refills | Status: DC

## 2016-09-15 MED ORDER — FLUTICASONE PROPIONATE 50 MCG/ACT NA SUSP: 2.0000 | Freq: Every day | NASAL | 6 refills | Status: DC

## 2016-09-15 MED ORDER — ASPIRIN EC 81 MG PO TBEC: 81.0000 mg | DELAYED_RELEASE_TABLET | Freq: Every day | ORAL | 2 refills | Status: DC

## 2016-09-15 MED ORDER — METFORMIN HCL ER 500 MG PO TB24: 500.0000 mg | ORAL_TABLET | Freq: Every day | ORAL | 3 refills | Status: DC

## 2016-09-15 MED ORDER — PRAVASTATIN SODIUM 20 MG PO TABS: 20.0000 mg | ORAL_TABLET | Freq: Every day | ORAL | 3 refills | Status: DC

## 2016-09-15 MED ORDER — METHYLPREDNISOLONE SODIUM SUCC 125 MG IJ SOLR
125.0000 mg | Freq: Once | INTRAMUSCULAR | Status: AC
  Administered 2016-09-15: 125 mg via INTRAMUSCULAR

## 2016-09-15 MED ORDER — PREDNISONE 20 MG PO TABS: 60.0000 mg | ORAL_TABLET | Freq: Every day | ORAL | 0 refills | Status: DC

## 2016-09-15 MED ORDER — LOSARTAN POTASSIUM-HCTZ 50-12.5 MG PO TABS: 1.0000 | ORAL_TABLET | Freq: Every day | ORAL | 3 refills | Status: DC

## 2016-09-15 MED ORDER — GUAIFENESIN ER 600 MG PO TB12: 600.0000 mg | ORAL_TABLET | Freq: Two times a day (BID) | ORAL | 2 refills | Status: DC

## 2016-09-15 MED ORDER — CYCLOBENZAPRINE HCL 10 MG PO TABS: 10.0000 mg | ORAL_TABLET | Freq: Three times a day (TID) | ORAL | 1 refills | Status: DC | PRN

## 2016-09-15 NOTE — Progress Notes (Signed)
Christian [name], is a 56 y.o. male  OZH:[id number]  GEX:[id number]  DOB - 01-09-1961  Chief Complaint  Patient presents with  . Follow-up        Subjective:   Christian [name] is a 56 y.o. male here today for a follow up visit, last seen 07/16/16, w/ signif pmhx of htn, dm, asthma, never smoked, has osa on home cpap, and chronic pain syndrome. He was seen by Pulm on 05/07/16 as well, currently on Breo per pulm, and  Prn mucinex/flonase nasal spray for congestion. However, he is not taking mucinex currently, and has not started the claritin.  cxr unremarkable.   Of note, he was seen in ED 08/25/16 for asthma /copd exacerbation, was given rx doxycycline, nebs.  He states he was feeling better, but now worse again. Productive cough (sometimes yellow at night and in am), nasal congestion at times, +wheezing. He is using his duonebs 4x day now and proair 2-3 x day. He recently ran out of he Memory [facility] a few days ago as well and needs to go back to Pulm to pick up samples (rx is too expensive for him).  Lots of stressors: financial, taking care of nephew who also has a lot of psychological problems as well, chronic ill wife.  Patient has No headache, No chest pain, No abdominal pain - No Nausea, No new weakness tingling or numbness, No Cough - SOB.  Pt seen today w/ his wife.  No problems updated.  ALLERGIES: Allergies  Allergen Reactions  . Phenergan [Promethazine Hcl] Itching    PAST MEDICAL HISTORY: Past Medical History:  Diagnosis Date  . Arthritis    "left knee" (01/22/2016)  . Asthma   . Chronic lower back pain   . COPD (chronic obstructive pulmonary disease) ([place])   . High cholesterol   . Hypertension   . Neuropathy   . OSA on CPAP   . Secondhand smoke exposure   . Type II diabetes mellitus ([place]) dx'd 01/17/2016    MEDICATIONS AT HOME: Prior to Admission medications   Medication Sig Start Date End Date Taking? Authorizing Provider  albuterol (PROAIR HFA) 108 (90 Base) MCG/ACT inhaler  Inhale 1-2 puffs into the lungs every 6 (six) hours as needed for wheezing or shortness of breath. 09/02/16   Maren Reamer, MD  amLODipine (NORVASC) 10 MG tablet Take 1 tablet (10 mg total) by mouth daily. 09/15/16   Maren Reamer, MD  aspirin EC 81 MG tablet Take 1 tablet (81 mg total) by mouth daily. 09/15/16   Maren Reamer, MD  azithromycin (ZITHROMAX) 250 MG tablet Day 1 take 2 tabs azithromycin, than take daily 07/23/16   Lottie Mussel T, MD  Blood Glucose Monitoring Suppl (ONE TOUCH ULTRA SYSTEM KIT) w/Device KIT Use as directed 05/28/16   Maren Reamer, MD  carisoprodol (SOMA) 350 MG tablet Take 1 tablet (350 mg total) by mouth 4 (four) times daily as needed for muscle spasms. 05/26/16   Maren Reamer, MD  cyclobenzaprine (FLEXERIL) 10 MG tablet Take 1 tablet (10 mg total) by mouth 3 (three) times daily as needed for muscle spasms. 09/15/16   Maren Reamer, MD  doxycycline (VIBRAMYCIN) 100 MG capsule Take 1 capsule (100 mg total) by mouth 2 (two) times daily. One po bid x 7 days 08/25/16   Orlie Dakin, MD  fluticasone [facility]) 50 MCG/ACT nasal spray Place 2 sprays into both nostrils daily. 09/15/16   Jacory Kamel, Leda Quail, MD  fluticasone furoate-vilanterol (BREO ELLIPTA)  100-25 MCG/INH AEPB Inhale 1 puff into the lungs daily. 05/07/16   Parrett, Tammy S, NP  fluticasone furoate-vilanterol (BREO ELLIPTA) 100-25 MCG/INH AEPB Inhale 1 puff into the lungs daily. 08/11/16   Juanito Doom, MD  gabapentin (NEURONTIN) 300 MG capsule Take 1 capsule (300 mg total) by mouth 4 (four) times daily. 09/15/16   Lottie Mussel T, MD  glucose blood (TRUE METRIX BLOOD GLUCOSE TEST) test strip Use as instructed 02/01/16   Lottie Mussel T, MD  guaiFENesin (MUCINEX) 600 MG 12 hr tablet Take 1 tablet (600 mg total) by mouth 2 (two) times daily. 09/15/16   Luisa Louk T, MD  ipratropium-albuterol (DUONEB) 0.5-2.5 (3) MG/3ML SOLN Take 3 mLs by nebulization every 4 (four) hours as needed.  05/28/16   Maren Reamer, MD  loratadine (CLARITIN) 10 MG tablet Take 1 tablet (10 mg total) by mouth daily. 09/15/16   Oluwatosin Higginson, Leda Quail, MD  losartan-hydrochlorothiazide (HYZAAR) 50-12.5 MG tablet Take 1 tablet by mouth daily. 09/15/16   Maren Reamer, MD  meloxicam (MOBIC) 15 MG tablet Take 1 tablet (15 mg total) by mouth daily. Take with food 05/26/16   Lottie Mussel T, MD  metFORMIN (GLUCOPHAGE XR) 500 MG 24 hr tablet Take 1 tablet (500 mg total) by mouth daily with breakfast. 09/15/16   Maren Reamer, MD  naproxen (NAPROSYN) 500 MG tablet Take 1 tablet (500 mg total) by mouth 3 (three) times daily with meals. 04/15/16   Maren Reamer, MD  pravastatin (PRAVACHOL) 20 MG tablet Take 1 tablet (20 mg total) by mouth daily. 09/15/16   Maren Reamer, MD  predniSONE (DELTASONE) 20 MG tablet Take 3 tablets (60 mg total) by mouth daily with breakfast. Day 1-3, take '60mg'$  (=3 tabs) qam, day 4-7 take '40mg'$  qam (=2 tabs), day 8-10 take 1 tab daily, than stop 09/15/16   Lottie Mussel T, MD  sodium chloride (OCEAN) 0.65 % SOLN nasal spray Place 1 spray into both nostrils as needed for congestion. 07/16/16   Maren Reamer, MD  TRUEPLUS LANCETS 28G MISC Check glucose 2 times a day 02/01/16   Lottie Mussel T, MD     Objective:   Vitals:   09/15/16 1139  BP: 127/84  Pulse: 65  Resp: 16  Temp: 98 F (36.7 C)  TempSrc: Oral  SpO2: 94%  Weight: 171 lb 9.6 oz (77.8 kg)    Exam General appearance : Awake, alert, not in any distress. Speech Clear. Not toxic looking, thin appearing, pleasant. HEENT: Atraumatic and Normocephalic, pupils equally reactive to light. Neck: supple, no JVD. No cervical lymphadenopathy.  Chest: decent aeration diffusely, but soft exp wheezing noted throughout. No crackles/rales noted. CVS: S1 S2 regular, no murmurs/gallups or rubs. Abdomen: Bowel sounds active, Non tender and not distended with no gaurding, rigidity or rebound. Extremities: B/L Lower Ext  shows no edema, both legs are warm to touch Neurology: Awake alert, and oriented X 3, CN II-XII grossly intact, Non focal Skin:No Rash  Data Review Lab Results  Component Value Date   HGBA1C 5.8 07/16/2016   HGBA1C 6.5 01/14/2016    Depression screen PHQ 2/9 07/16/2016 04/02/2016  Decreased Interest 0 3  Down, Depressed, Hopeless 0 0  PHQ - 2 Score 0 3  Altered sleeping - 0  Tired, decreased energy - 0  Change in appetite - 0  Feeling bad or failure about yourself  - 0  Trouble concentrating - 0  Moving slowly or fidgety/restless - 0  Suicidal  thoughts - 0  PHQ-9 Score - 3      Assessment & Plan    1. Moderate persistent asthma with exacerbation - short prednisone taper - hold off on abx for now - encouraged to start claritin daily, and flonase daily., add mucinex 600bid. - decline nebs trx now, he will do duonebs trx when he gets home. - to go to [facility] to pick up sample Breo today he said, recd make fu appt w/ Pulm soon as well. - methylPREDNISolone sodium succinate (SOLU-MEDROL) 125 mg/2 mL injection 125 mg; Inject 2 mLs (125 mg total) into the muscle once.  2. Essential hypertension Controlled, low salt diet, continue same rx.  3. Prediabetes - tol metfromin qd, continue - POCT glucose (manual entry)  4. H/o Secondhand smoke exposure  5. Stressors in home - complicating health     Patient have been counseled extensively about nutrition and exercise  Return in about 2 months (around 11/15/2016) for asthma/copd /allergies.  The patient was given clear instructions to go to ER or return to medical center if symptoms don't improve, worsen or new problems develop. The patient verbalized understanding. The patient was told to call to get lab results if they haven't heard anything in the next week.   This note has been created with Surveyor, quantity. Any transcriptional errors are unintentional.   Maren Reamer, MD,  [facility] [place], [place]   09/15/2016, 12:26 PM

## 2016-09-15 NOTE — Patient Instructions (Addendum)
- needs to set up w/ new PCP - should be MD (not pa/np due to pt complications.)  - wants same doctor as wife.  Make followup with Pulmonary!   Asthma, Adult Asthma is a condition of the lungs in which the airways tighten and narrow. Asthma can make it hard to breathe. Asthma cannot be cured, but medicine and lifestyle changes can help control it. Asthma may be started (triggered) by:  Animal skin flakes (dander).  Dust.  Cockroaches.  Pollen.  Mold.  Smoke.  Cleaning products.  Hair sprays or aerosol sprays.  Paint fumes or strong smells.  Cold air, weather changes, and winds.  Crying or laughing hard.  Stress.  Certain medicines or drugs.  Foods, such as dried fruit, potato chips, and sparkling grape juice.  Infections or conditions (colds, flu).  Exercise.  Certain medical conditions or diseases.  Exercise or tiring activities. Follow these instructions at home:  Take medicine as told by your doctor.  Use a peak flow meter as told by your doctor. A peak flow meter is a tool that measures how well the lungs are working.  Record and keep track of the peak flow meter's readings.  Understand and use the asthma action plan. An asthma action plan is a written plan for taking care of your asthma and treating your attacks.  To help prevent asthma attacks:  Do not smoke. Stay away from secondhand smoke.  Change your heating and air conditioning filter often.  Limit your use of fireplaces and wood stoves.  Get rid of pests (such as roaches and mice) and their droppings.  Throw away plants if you see mold on them.  Clean your floors. Dust regularly. Use cleaning products that do not smell.  Have someone vacuum when you are not home. Use a vacuum cleaner with a HEPA filter if possible.  Replace carpet with wood, tile, or vinyl flooring. Carpet can trap animal skin flakes and dust.  Use allergy-proof pillows, mattress covers, and box spring covers.  Wash  bed sheets and blankets every week in hot water and dry them in a dryer.  Use blankets that are made of polyester or cotton.  Clean bathrooms and kitchens with bleach. If possible, have someone repaint the walls in these rooms with mold-resistant paint. Keep out of the rooms that are being cleaned and painted.  Wash hands often. Contact a doctor if:  You have make a whistling sound when breaking (wheeze), have shortness of breath, or have a cough even if taking medicine to prevent attacks.  The colored mucus you cough up (sputum) is thicker than usual.  The colored mucus you cough up changes from clear or white to yellow, green, gray, or bloody.  You have problems from the medicine you are taking such as:  A rash.  Itching.  Swelling.  Trouble breathing.  You need reliever medicines more than 2-3 times a week.  Your peak flow measurement is still at 50-79% of your personal best after following the action plan for 1 hour.  You have a fever. Get help right away if:  You seem to be worse and are not responding to medicine during an asthma attack.  You are short of breath even at rest.  You get short of breath when doing very little activity.  You have trouble eating, drinking, or talking.  You have chest pain.  You have a fast heartbeat.  Your lips or fingernails start to turn blue.  You are light-headed, dizzy,  or faint.  Your peak flow is less than 50% of your personal best. This information is not intended to replace advice given to you by your health care provider. Make sure you discuss any questions you have with your health care provider. Document Released: 10/01/2007 Document Revised: 09/20/2015 Document Reviewed: 11/11/2012 Elsevier Interactive Patient Education  2017 [employer].

## 2016-09-15 NOTE — Telephone Encounter (Signed)
No breo samples currently.  Pt aware.  Nothing further needed.

## 2016-09-25 ENCOUNTER — Encounter: Payer: Self-pay | Admitting: Adult Health

## 2016-09-25 ENCOUNTER — Ambulatory Visit (INDEPENDENT_AMBULATORY_CARE_PROVIDER_SITE_OTHER): Payer: Medicare Other | Admitting: Adult Health

## 2016-09-25 ENCOUNTER — Ambulatory Visit: Payer: Medicare Other | Admitting: Pulmonary Disease

## 2016-09-25 DIAGNOSIS — J4541 Moderate persistent asthma with (acute) exacerbation: Secondary | ICD-10-CM

## 2016-09-25 MED ORDER — FLUTICASONE FUROATE-VILANTEROL 100-25 MCG/INH IN AEPB: 1.0000 | INHALATION_SPRAY | Freq: Every day | RESPIRATORY_TRACT | 0 refills | Status: AC

## 2016-09-25 MED ORDER — FLUTICASONE FUROATE-VILANTEROL 100-25 MCG/INH IN AEPB: 1.0000 | INHALATION_SPRAY | Freq: Every day | RESPIRATORY_TRACT | 1 refills | Status: DC

## 2016-09-25 NOTE — Addendum Note (Signed)
Addended by: Parke Poisson E on: 09/25/2016 10:08 AM   Modules accepted: Orders

## 2016-09-25 NOTE — Patient Instructions (Addendum)
Restart BREO 1 puff daily, rinse well after use.  Prescription sent to mail order pharmacy  , call if too expensive.  Finish Prednisone as discussed.  Follow up with Dr. Lake Bells in 2-3 months and As needed   Please contact office for sooner follow up if symptoms do not improve or worsen or seek emergency care

## 2016-09-25 NOTE — Assessment & Plan Note (Signed)
Recent flare now resolving  Pt education on Asthma and controller medication   Plan  Patient Instructions  Restart BREO 1 puff daily, rinse well after use.  Prescription sent to mail order pharmacy  , call if too expensive.  Finish Prednisone as discussed.  Follow up with Dr. Lake Bells in 2-3 months and As needed   Please contact office for sooner follow up if symptoms do not improve or worsen or seek emergency care

## 2016-09-25 NOTE — Progress Notes (Signed)
_0  ID: Christian [name], male    DOB: 1961-04-16, 56 y.o.   MRN: [id number]  Chief Complaint  Patient presents with  . Follow-up    Asthma    Referring provider: Maren Reamer, MD  HPI: 56 yo male never smoker seen for asthma consult 03/25/16  Has OSA on CPAP At bedtime    TEST ;Events  PFT 05/16/2016 >FEV1 60%, ratio 58, FVC 82%, DLCO 74%, no BD response .   09/25/2016 Follow up: Asthma  Pt returns for 4 month follow up . Says he ran out of BREO samples and breathing has not been as good. Had Asthma flare last month , and again this month . Went to ER/PCP and got prednisone taper. CXR was clear . Says he had cough , wheezing , tightness, dyspnea.  Increased Albuterol use, only last for short while .  He is feeling better since prednisone. Has 1 day left of prednisone .  Cough and wheezing are much better.  Patient denies any chest pain, orthopnea, PND, leg swelling, hemoptysis.  Allergies  Allergen Reactions  . Phenergan [Promethazine Hcl] Itching    Immunization History  Administered Date(s) Administered  . Influenza Split 02/23/2016  . Pneumococcal Conjugate-13 03/25/2016  . Pneumococcal Polysaccharide-23 07/16/2016  . Tdap 04/02/2016    Past Medical History:  Diagnosis Date  . Arthritis    "left knee" (01/22/2016)  . Asthma   . Chronic lower back pain   . COPD (chronic obstructive pulmonary disease) ([place])   . High cholesterol   . Hypertension   . Neuropathy   . OSA on CPAP   . Secondhand smoke exposure   . Type II diabetes mellitus ([place]) dx'd 01/17/2016    Tobacco History: History  Smoking Status  . Passive Smoke Exposure - Never Smoker  Smokeless Tobacco  . Never Used   Counseling given: Not Answered   Outpatient Encounter Prescriptions as of 09/25/2016  Medication Sig  . albuterol (PROAIR HFA) 108 (90 Base) MCG/ACT inhaler Inhale 1-2 puffs into the lungs every 6 (six) hours as needed for wheezing or shortness of breath.  Marland Kitchen amLODipine (NORVASC)  10 MG tablet Take 1 tablet (10 mg total) by mouth daily.  Marland Kitchen aspirin EC 81 MG tablet Take 1 tablet (81 mg total) by mouth daily.  . Blood Glucose Monitoring Suppl (ONE TOUCH ULTRA SYSTEM KIT) w/Device KIT Use as directed  . carisoprodol (SOMA) 350 MG tablet Take 1 tablet (350 mg total) by mouth 4 (four) times daily as needed for muscle spasms.  . cyclobenzaprine (FLEXERIL) 10 MG tablet Take 1 tablet (10 mg total) by mouth 3 (three) times daily as needed for muscle spasms.  . fluticasone (FLONASE) 50 MCG/ACT nasal spray Place 2 sprays into both nostrils daily.  . fluticasone furoate-vilanterol (BREO ELLIPTA) 100-25 MCG/INH AEPB Inhale 1 puff into the lungs daily.  . fluticasone furoate-vilanterol (BREO ELLIPTA) 100-25 MCG/INH AEPB Inhale 1 puff into the lungs daily.  Marland Kitchen gabapentin (NEURONTIN) 300 MG capsule Take 1 capsule (300 mg total) by mouth 4 (four) times daily.  Marland Kitchen glucose blood (TRUE METRIX BLOOD GLUCOSE TEST) test strip Use as instructed  . guaiFENesin (MUCINEX) 600 MG 12 hr tablet Take 1 tablet (600 mg total) by mouth 2 (two) times daily.  Marland Kitchen ipratropium-albuterol (DUONEB) 0.5-2.5 (3) MG/3ML SOLN Take 3 mLs by nebulization every 4 (four) hours as needed.  . loratadine (CLARITIN) 10 MG tablet Take 1 tablet (10 mg total) by mouth daily.  Marland Kitchen losartan-hydrochlorothiazide (HYZAAR) 50-12.5 MG  tablet Take 1 tablet by mouth daily.  . meloxicam (MOBIC) 15 MG tablet Take 1 tablet (15 mg total) by mouth daily. Take with food  . metFORMIN (GLUCOPHAGE XR) 500 MG 24 hr tablet Take 1 tablet (500 mg total) by mouth daily with breakfast.  . naproxen (NAPROSYN) 500 MG tablet Take 1 tablet (500 mg total) by mouth 3 (three) times daily with meals.  . pravastatin (PRAVACHOL) 20 MG tablet Take 1 tablet (20 mg total) by mouth daily.  . predniSONE (DELTASONE) 20 MG tablet Take 3 tablets (60 mg total) by mouth daily with breakfast. Day 1-3, take 46m (=3 tabs) qam, day 4-7 take 411mqam (=2 tabs), day 8-10 take 1 tab  daily, than stop  . sodium chloride (OCEAN) 0.65 % SOLN nasal spray Place 1 spray into both nostrils as needed for congestion.  . TRUEPLUS LANCETS 28G MISC Check glucose 2 times a day  . [DISCONTINUED] azithromycin (ZITHROMAX) 250 MG tablet Day 1 take 2 tabs azithromycin, than take daily (Patient not taking: Reported on 09/25/2016)  . [DISCONTINUED] doxycycline (VIBRAMYCIN) 100 MG capsule Take 1 capsule (100 mg total) by mouth 2 (two) times daily. One po bid x 7 days (Patient not taking: Reported on 09/25/2016)   No facility-administered encounter medications on file as of 09/25/2016.      Review of Systems  Constitutional:   No  weight loss, night sweats,  Fevers, chills, + fatigue, or  lassitude.  HEENT:   No headaches,  Difficulty swallowing,  Tooth/dental problems, or  Sore throat,                No sneezing, itching, ear ache, nasal congestion, post nasal drip,   CV:  No chest pain,  Orthopnea, PND, swelling in lower extremities, anasarca, dizziness, palpitations, syncope.   GI  No heartburn, indigestion, abdominal pain, nausea, vomiting, diarrhea, change in bowel habits, loss of appetite, bloody stools.   Resp:   No chest wall deformity  Skin: no rash or lesions.  GU: no dysuria, change in color of urine, no urgency or frequency.  No flank pain, no hematuria   MS:  No joint pain or swelling.  No decreased range of motion.  No back pain.    Physical Exam  BP 124/76 (BP Location: Left Arm, Cuff Size: Normal)   Pulse 76   Ht 5' 10" (1.778 m)   Wt 171 lb 6.4 oz (77.7 kg)   SpO2 99%   BMI 24.59 kg/m   GEN: A/Ox3; pleasant , NAD, well nourished    HEENT:  [place]/AT,  EACs-clear, TMs-wnl, NOSE-clear, THROAT-clear, no lesions, no postnasal drip or exudate noted.   NECK:  Supple w/ fair ROM; no JVD; normal carotid impulses w/o bruits; no thyromegaly or nodules palpated; no lymphadenopathy.    RESP  Clear  P & A; w/o, wheezes/ rales/ or rhonchi. no accessory muscle use, no  dullness to percussion  CARD:  RRR, no m/r/g, no peripheral edema, pulses intact, no cyanosis or clubbing.  GI:   Soft & nt; nml bowel sounds; no organomegaly or masses detected.   Musco: Warm bil, no deformities or joint swelling noted.   Neuro: alert, no focal deficits noted.    Skin: Warm, no lesions or rashes    Lab Results:   BNP No results found for: BNP  ProBNP No results found for: PROBNP  Imaging: No results found.   Assessment & Plan:   Asthma with acute exacerbation Recent flare now resolving  Pt  education on Asthma and controller medication   Plan  Patient Instructions  Restart BREO 1 puff daily, rinse well after use.  Prescription sent to mail order pharmacy  , call if too expensive.  Finish Prednisone as discussed.  Follow up with Dr. Lake Bells in 2-3 months and As needed   Please contact office for sooner follow up if symptoms do not improve or worsen or seek emergency care         Rexene Edison, NP 09/25/2016

## 2016-09-25 NOTE — Progress Notes (Signed)
Reviewed, agree 

## 2016-10-02 ENCOUNTER — Encounter: Payer: Self-pay | Admitting: Family Medicine

## 2016-10-02 ENCOUNTER — Ambulatory Visit (INDEPENDENT_AMBULATORY_CARE_PROVIDER_SITE_OTHER): Payer: Medicare Other | Admitting: Family Medicine

## 2016-10-02 NOTE — Progress Notes (Deleted)
Patient ID: Christian [name], male    DOB: September 27, 1960, 56 y.o.   MRN: [id number]  PCP: Christian Jun, FNP  Chief Complaint  Patient presents with  . Establish Care    Subjective:  HPI  Christian [name] is a 56 y.o. male presents for evaluation of     Social History   Social History  . Marital status: Married    Spouse name: N/A  . Number of children: N/A  . Years of education: N/A   Occupational History  . Not on file.   Social History Main Topics  . Smoking status: Passive Smoke Exposure - Never Smoker  . Smokeless tobacco: Never Used  . Alcohol use No  . Drug use: No  . Sexual activity: Yes   Other Topics Concern  . Not on file   Social History Narrative  . No narrative on file    Family History  Problem Relation Age of Onset  . Hypertension Mother   . Hypertension Father   . Heart Problems Brother        Heart transplant, pt does not know cause      Review of Systems  Patient Active Problem List   Diagnosis Date Noted  . Chronic pain syndrome 04/02/2016  . Intrinsic asthma 03/25/2016  . Asthma with acute exacerbation 01/24/2016  . Essential hypertension 01/23/2016  . Hyperlipidemia 01/23/2016  . Secondhand smoke exposure 01/23/2016  . Diabetes mellitus with neuropathy ([place]) 01/23/2016  . Acute bronchitis   . Musculoskeletal chest pain 01/22/2016    Allergies  Allergen Reactions  . Phenergan [Promethazine Hcl] Itching    Prior to Admission medications   Medication Sig Start Date End Date Taking? Authorizing Provider  albuterol (PROAIR HFA) 108 (90 Base) MCG/ACT inhaler Inhale 1-2 puffs into the lungs every 6 (six) hours as needed for wheezing or shortness of breath. 09/02/16  Yes Langeland, Dawn T, MD  amLODipine (NORVASC) 10 MG tablet Take 1 tablet (10 mg total) by mouth daily. 09/15/16  Yes Maren Reamer, MD  aspirin EC 81 MG tablet Take 1 tablet (81 mg total) by mouth daily. 09/15/16  Yes Langeland, Dawn T, MD  Blood Glucose Monitoring Suppl  (ONE TOUCH ULTRA SYSTEM KIT) w/Device KIT Use as directed 05/28/16  Yes Langeland, Dawn T, MD  carisoprodol (SOMA) 350 MG tablet Take 1 tablet (350 mg total) by mouth 4 (four) times daily as needed for muscle spasms. 05/26/16  Yes Langeland, Dawn T, MD  cyclobenzaprine (FLEXERIL) 10 MG tablet Take 1 tablet (10 mg total) by mouth 3 (three) times daily as needed for muscle spasms. 09/15/16  Yes Langeland, Dawn T, MD  fluticasone (FLONASE) 50 MCG/ACT nasal spray Place 2 sprays into both nostrils daily. 09/15/16  Yes Langeland, Dawn T, MD  fluticasone furoate-vilanterol (BREO ELLIPTA) 100-25 MCG/INH AEPB Inhale 1 puff into the lungs daily. 09/25/16  Yes Parrett, Tammy S, NP  gabapentin (NEURONTIN) 300 MG capsule Take 1 capsule (300 mg total) by mouth 4 (four) times daily. 09/15/16  Yes Langeland, Dawn T, MD  glucose blood (TRUE METRIX BLOOD GLUCOSE TEST) test strip Use as instructed 02/01/16  Yes Langeland, Dawn T, MD  ipratropium-albuterol (DUONEB) 0.5-2.5 (3) MG/3ML SOLN Take 3 mLs by nebulization every 4 (four) hours as needed. 05/28/16  Yes Langeland, Dawn T, MD  loratadine (CLARITIN) 10 MG tablet Take 1 tablet (10 mg total) by mouth daily. 09/15/16  Yes Langeland, Dawn T, MD  losartan-hydrochlorothiazide (HYZAAR) 50-12.5 MG tablet Take 1 tablet by mouth  daily. 09/15/16  Yes Langeland, Dawn T, MD  meloxicam (MOBIC) 15 MG tablet Take 1 tablet (15 mg total) by mouth daily. Take with food 05/26/16  Yes Langeland, Dawn T, MD  metFORMIN (GLUCOPHAGE XR) 500 MG 24 hr tablet Take 1 tablet (500 mg total) by mouth daily with breakfast. 09/15/16  Yes Langeland, Dawn T, MD  naproxen (NAPROSYN) 500 MG tablet Take 1 tablet (500 mg total) by mouth 3 (three) times daily with meals. 04/15/16  Yes Langeland, Dawn T, MD  pravastatin (PRAVACHOL) 20 MG tablet Take 1 tablet (20 mg total) by mouth daily. 09/15/16  Yes Langeland, Dawn T, MD  sodium chloride (OCEAN) 0.65 % SOLN nasal spray Place 1 spray into both nostrils as needed for  congestion. 07/16/16  Yes Maren Reamer, MD  TRUEPLUS LANCETS 28G MISC Check glucose 2 times a day 02/01/16  Yes Maren Reamer, MD    Past Medical, Surgical Family and Social History reviewed and updated.    Objective:   Today's Vitals   10/02/16 1105  BP: (!) 144/78  Pulse: 91  Resp: 16  Temp: 98.1 F (36.7 C)  TempSrc: Oral  SpO2: 98%  Weight: 171 lb 6.4 oz (77.7 kg)  Height: '5\' 10"'$  (1.778 m)    Wt Readings from Last 3 Encounters:  10/02/16 171 lb 6.4 oz (77.7 kg)  09/25/16 171 lb 6.4 oz (77.7 kg)  09/15/16 171 lb 9.6 oz (77.8 kg)    Physical Exam         Assessment & Plan:  There are no diagnoses linked to this encounter.   Christian Sage. Kenton Kingfisher, MSN, FNP-C [facility]  [address] [place], [place] [address] [phone]

## 2016-10-12 NOTE — Progress Notes (Signed)
Erroneous. Patient is established at [facility] and months.

## 2016-10-17 ENCOUNTER — Ambulatory Visit: Payer: Medicare Other | Admitting: Family Medicine

## 2016-11-28 ENCOUNTER — Ambulatory Visit: Payer: Medicare Other | Admitting: Pulmonary Disease

## 2016-12-11 ENCOUNTER — Ambulatory Visit: Payer: Medicare Other | Attending: Family Medicine | Admitting: Family Medicine

## 2016-12-11 ENCOUNTER — Encounter: Payer: Self-pay | Admitting: Family Medicine

## 2016-12-11 VITALS — BP 133/88 | HR 81 | Temp 98.3°F | Ht 70.0 in | Wt 169.4 lb

## 2016-12-11 DIAGNOSIS — Z7982 Long term (current) use of aspirin: Secondary | ICD-10-CM | POA: Diagnosis not present

## 2016-12-11 DIAGNOSIS — Z7984 Long term (current) use of oral hypoglycemic drugs: Secondary | ICD-10-CM | POA: Diagnosis not present

## 2016-12-11 DIAGNOSIS — Z9889 Other specified postprocedural states: Secondary | ICD-10-CM | POA: Insufficient documentation

## 2016-12-11 DIAGNOSIS — G4733 Obstructive sleep apnea (adult) (pediatric): Secondary | ICD-10-CM | POA: Diagnosis not present

## 2016-12-11 DIAGNOSIS — Z79899 Other long term (current) drug therapy: Secondary | ICD-10-CM | POA: Insufficient documentation

## 2016-12-11 DIAGNOSIS — I10 Essential (primary) hypertension: Secondary | ICD-10-CM | POA: Insufficient documentation

## 2016-12-11 DIAGNOSIS — E7800 Pure hypercholesterolemia, unspecified: Secondary | ICD-10-CM | POA: Insufficient documentation

## 2016-12-11 DIAGNOSIS — J449 Chronic obstructive pulmonary disease, unspecified: Secondary | ICD-10-CM | POA: Insufficient documentation

## 2016-12-11 DIAGNOSIS — J4541 Moderate persistent asthma with (acute) exacerbation: Secondary | ICD-10-CM | POA: Insufficient documentation

## 2016-12-11 DIAGNOSIS — E119 Type 2 diabetes mellitus without complications: Secondary | ICD-10-CM | POA: Diagnosis not present

## 2016-12-11 DIAGNOSIS — Z888 Allergy status to other drugs, medicaments and biological substances status: Secondary | ICD-10-CM | POA: Insufficient documentation

## 2016-12-11 MED ORDER — METHYLPREDNISOLONE SODIUM SUCC 125 MG IJ SOLR
125.0000 mg | Freq: Once | INTRAMUSCULAR | Status: AC
  Administered 2016-12-11: 125 mg via INTRAMUSCULAR

## 2016-12-11 MED ORDER — FLUTICASONE FUROATE-VILANTEROL 100-25 MCG/INH IN AEPB: 1.0000 | INHALATION_SPRAY | Freq: Every day | RESPIRATORY_TRACT | 1 refills | Status: DC

## 2016-12-11 MED ORDER — ALBUTEROL SULFATE HFA 108 (90 BASE) MCG/ACT IN AERS: 1.0000 | INHALATION_SPRAY | Freq: Four times a day (QID) | RESPIRATORY_TRACT | 2 refills | Status: DC | PRN

## 2016-12-11 NOTE — Patient Instructions (Signed)

## 2016-12-11 NOTE — Progress Notes (Signed)
Subjective:  Patient ID: Christian [name], male    DOB: 22-May-1960  Age: 56 y.o. MRN: [id number]  CC: Asthma   HPI Christian [name] is a 56 year old male with a history of asthma, hypertension, hyperlipidemia, type 2 diabetes mellitus (A1c 5.8) who called in today for an acute visit due to increasing wheezing for the last 4 days and needing to use his rescue inhaler and nebulizer more.  Wheezing has been intermittent and he denies chest pains or shortness of breath. On review of his medications he has been using his Proventil MDI, albuterol nebulizer but has not been using Breo which appears on his med list - states he initially received samples from pulmonary and ran out several months ago. His last visit to the plan: pulmonary was on 09/25/16. He does have a Combivent rest cannot inhaler which he states he received from an old pharmacy but does not appear in his record.  Past Medical History:  Diagnosis Date  . Arthritis    "left knee" (01/22/2016)  . Asthma   . Chronic lower back pain   . COPD (chronic obstructive pulmonary disease) ([place])   . High cholesterol   . Hypertension   . Neuropathy   . OSA on CPAP   . Secondhand smoke exposure   . Type II diabetes mellitus ([place]) dx'd 01/17/2016    Past Surgical History:  Procedure Laterality Date  . ANTERIOR CERVICAL DECOMP/DISCECTOMY FUSION  ~ 1995   "titanium"  . APPENDECTOMY  ~ 1977  . BACK SURGERY    . INGUINAL HERNIA REPAIR Right ~ 2000  . LUMBAR DISC SURGERY  ~ 2000; ~2006  . NASAL SINUS SURGERY  ~ 2007    Allergies  Allergen Reactions  . Phenergan [Promethazine Hcl] Itching     Outpatient Medications Prior to Visit  Medication Sig Dispense Refill  . amLODipine (NORVASC) 10 MG tablet Take 1 tablet (10 mg total) by mouth daily. 90 tablet 3  . aspirin EC 81 MG tablet Take 1 tablet (81 mg total) by mouth daily. 90 tablet 2  . Blood Glucose Monitoring Suppl (ONE TOUCH ULTRA SYSTEM KIT) w/Device KIT Use as directed 1 each 0  .  carisoprodol (SOMA) 350 MG tablet Take 1 tablet (350 mg total) by mouth 4 (four) times daily as needed for muscle spasms. 50 tablet 0  . cyclobenzaprine (FLEXERIL) 10 MG tablet Take 1 tablet (10 mg total) by mouth 3 (three) times daily as needed for muscle spasms. 50 tablet 1  . fluticasone (FLONASE) 50 MCG/ACT nasal spray Place 2 sprays into both nostrils daily. 16 g 6  . gabapentin (NEURONTIN) 300 MG capsule Take 1 capsule (300 mg total) by mouth 4 (four) times daily. 120 capsule 3  . glucose blood (TRUE METRIX BLOOD GLUCOSE TEST) test strip Use as instructed 100 each 12  . ipratropium-albuterol (DUONEB) 0.5-2.5 (3) MG/3ML SOLN Take 3 mLs by nebulization every 4 (four) hours as needed. 360 mL 2  . loratadine (CLARITIN) 10 MG tablet Take 1 tablet (10 mg total) by mouth daily. 30 tablet 11  . losartan-hydrochlorothiazide (HYZAAR) 50-12.5 MG tablet Take 1 tablet by mouth daily. 90 tablet 3  . meloxicam (MOBIC) 15 MG tablet Take 1 tablet (15 mg total) by mouth daily. Take with food 30 tablet 2  . metFORMIN (GLUCOPHAGE XR) 500 MG 24 hr tablet Take 1 tablet (500 mg total) by mouth daily with breakfast. 90 tablet 3  . naproxen (NAPROSYN) 500 MG tablet Take 1 tablet (500 mg total)  by mouth 3 (three) times daily with meals. 90 tablet 2  . pravastatin (PRAVACHOL) 20 MG tablet Take 1 tablet (20 mg total) by mouth daily. 90 tablet 3  . sodium chloride (OCEAN) 0.65 % SOLN nasal spray Place 1 spray into both nostrils as needed for congestion. 30 mL 2  . TRUEPLUS LANCETS 28G MISC Check glucose 2 times a day 100 each 3  . albuterol (PROAIR HFA) 108 (90 Base) MCG/ACT inhaler Inhale 1-2 puffs into the lungs every 6 (six) hours as needed for wheezing or shortness of breath. 1 Inhaler 2  . fluticasone furoate-vilanterol (BREO ELLIPTA) 100-25 MCG/INH AEPB Inhale 1 puff into the lungs daily. 3 each 1   No facility-administered medications prior to visit.     ROS Review of Systems  Constitutional: Negative for  activity change and appetite change.  HENT: Negative for sinus pressure and sore throat.   Eyes: Negative for visual disturbance.  Respiratory: Positive for wheezing. Negative for cough, chest tightness and shortness of breath.   Cardiovascular: Negative for chest pain and leg swelling.  Gastrointestinal: Negative for abdominal distention, abdominal pain, constipation and diarrhea.  Endocrine: Negative.   Genitourinary: Negative for dysuria.  Musculoskeletal: Negative for joint swelling and myalgias.  Skin: Negative for rash.  Allergic/Immunologic: Negative.   Neurological: Negative for weakness, light-headedness and numbness.  Psychiatric/Behavioral: Negative for dysphoric mood and suicidal ideas.    Objective:  BP 133/88   Pulse 81   Temp 98.3 F (36.8 C) (Oral)   Ht '5\' 10"'$  (1.778 m)   Wt 169 lb 6.4 oz (76.8 kg)   SpO2 95%   BMI 24.31 kg/m   BP/Weight 12/11/2016 10/02/2016 4/43/1540  Systolic BP 086 761 950  Diastolic BP 88 78 76  Wt. (Lbs) 169.4 171.4 171.4  BMI 24.31 24.59 24.59      Physical Exam  Constitutional: He is oriented to person, place, and time. He appears well-developed and well-nourished.  Cardiovascular: Normal rate, normal heart sounds and intact distal pulses.   No murmur heard. Pulmonary/Chest: Effort normal and breath sounds normal. He has no wheezes. He has no rales. He exhibits no tenderness.  Abdominal: Soft. Bowel sounds are normal. He exhibits no distension and no mass. There is no tenderness.  Musculoskeletal: Normal range of motion.  Neurological: He is alert and oriented to person, place, and time.  Skin: Skin is warm and dry.  Psychiatric: He has a normal mood and affect.     Assessment & Plan:   1. Moderate persistent asthma with acute exacerbation Current exacerbation triggered by the fact that he has no controller medication Breo and Proair sent to the pharmacy Keep appointment with PCP to reassess for improvement Keep appointment  with pulmonary. - methylPREDNISolone sodium succinate (SOLU-MEDROL) 125 mg/2 mL injection 125 mg; Inject 2 mLs (125 mg total) into the muscle once.   Meds ordered this encounter  Medications  . fluticasone furoate-vilanterol (BREO ELLIPTA) 100-25 MCG/INH AEPB    Sig: Inhale 1 puff into the lungs daily.    Dispense:  3 each    Refill:  1  . albuterol (PROAIR HFA) 108 (90 Base) MCG/ACT inhaler    Sig: Inhale 1-2 puffs into the lungs every 6 (six) hours as needed for wheezing or shortness of breath.    Dispense:  1 Inhaler    Refill:  2    Carisoprodol refill not appropriate  . methylPREDNISolone sodium succinate (SOLU-MEDROL) 125 mg/2 mL injection 125 mg    Follow-up: Return for  Follow-up of chronic medical conditions, please keep previously scheduled appointment with PCP.Marland Kitchen   This note has been created with Surveyor, quantity. Any transcriptional errors are unintentional.     Arnoldo Morale MD

## 2016-12-12 ENCOUNTER — Telehealth: Payer: Self-pay | Admitting: Family Medicine

## 2016-12-12 MED ORDER — FLUTICASONE FUROATE-VILANTEROL 100-25 MCG/INH IN AEPB: 1.0000 | INHALATION_SPRAY | Freq: Every day | RESPIRATORY_TRACT | 1 refills | Status: DC

## 2016-12-12 MED ORDER — ALBUTEROL SULFATE HFA 108 (90 BASE) MCG/ACT IN AERS: 1.0000 | INHALATION_SPRAY | Freq: Four times a day (QID) | RESPIRATORY_TRACT | 2 refills | Status: DC | PRN

## 2016-12-12 NOTE — Telephone Encounter (Signed)
Pt was here yesterday and he want his med to be sent to the [facility] on [address] fluticasone furoate-vilanterol (BREO ELLIPTA) 100-25 MCG/INH AEPB  albuterol (PROAIR HFA) 108 (90 Base) MCG/ACT inhaler  Please follow up

## 2016-12-12 NOTE — Telephone Encounter (Signed)
Requested prescriptions sent to [facility] on [facility]

## 2016-12-15 ENCOUNTER — Telehealth: Payer: Self-pay | Admitting: General Practice

## 2016-12-15 NOTE — Telephone Encounter (Signed)
No additional prescriptions. Keep appointment with PCP on 12/22/16 to discuss this.

## 2016-12-15 NOTE — Telephone Encounter (Signed)
[facility] called requesting status of medication request. Pharmacist  was informed that we are waiting on provider's approval.

## 2016-12-15 NOTE — Telephone Encounter (Signed)
Pt was called and informed that shot was given in office. Pt states that he would like a script for prednisone to have on hand at home for his  Asthma flare up.

## 2016-12-15 NOTE — Telephone Encounter (Signed)
Pt. Called stating that when he was seen on 8/17 the PCP was going to prescribed Prednisone for his Asthma. Pt. States he did not receive any medication and would like an Rx sent to [facility] on [address]. Please f/u.

## 2016-12-16 NOTE — Telephone Encounter (Signed)
Pt. Called requesting to know the status of the prednisone Rx and was informed that per the doctor that he saw last week no Rx was going to be given to him and to keep appt. With the new PCP. Pt. Understood and have no other questions.

## 2016-12-19 ENCOUNTER — Telehealth: Payer: Self-pay | Admitting: Pulmonary Disease

## 2016-12-19 NOTE — Telephone Encounter (Signed)
Attempted to contact pt. No answer, no option to leave a message. Will try back.  

## 2016-12-22 ENCOUNTER — Ambulatory Visit: Payer: Medicare Other | Attending: Internal Medicine | Admitting: Internal Medicine

## 2016-12-22 ENCOUNTER — Encounter: Payer: Self-pay | Admitting: Internal Medicine

## 2016-12-22 ENCOUNTER — Telehealth: Payer: Self-pay | Admitting: Internal Medicine

## 2016-12-22 VITALS — BP 123/81 | HR 75 | Temp 98.4°F | Resp 18 | Ht 69.0 in | Wt 169.0 lb

## 2016-12-22 DIAGNOSIS — I10 Essential (primary) hypertension: Secondary | ICD-10-CM | POA: Insufficient documentation

## 2016-12-22 DIAGNOSIS — Z9889 Other specified postprocedural states: Secondary | ICD-10-CM | POA: Diagnosis not present

## 2016-12-22 DIAGNOSIS — G4733 Obstructive sleep apnea (adult) (pediatric): Secondary | ICD-10-CM | POA: Diagnosis not present

## 2016-12-22 DIAGNOSIS — Z23 Encounter for immunization: Secondary | ICD-10-CM

## 2016-12-22 DIAGNOSIS — J4540 Moderate persistent asthma, uncomplicated: Secondary | ICD-10-CM | POA: Diagnosis not present

## 2016-12-22 DIAGNOSIS — G894 Chronic pain syndrome: Secondary | ICD-10-CM | POA: Diagnosis not present

## 2016-12-22 DIAGNOSIS — E1140 Type 2 diabetes mellitus with diabetic neuropathy, unspecified: Secondary | ICD-10-CM | POA: Diagnosis not present

## 2016-12-22 DIAGNOSIS — M1712 Unilateral primary osteoarthritis, left knee: Secondary | ICD-10-CM | POA: Insufficient documentation

## 2016-12-22 DIAGNOSIS — Z7722 Contact with and (suspected) exposure to environmental tobacco smoke (acute) (chronic): Secondary | ICD-10-CM | POA: Diagnosis not present

## 2016-12-22 DIAGNOSIS — E119 Type 2 diabetes mellitus without complications: Secondary | ICD-10-CM

## 2016-12-22 DIAGNOSIS — Z1211 Encounter for screening for malignant neoplasm of colon: Secondary | ICD-10-CM

## 2016-12-22 DIAGNOSIS — Z7984 Long term (current) use of oral hypoglycemic drugs: Secondary | ICD-10-CM | POA: Insufficient documentation

## 2016-12-22 DIAGNOSIS — E785 Hyperlipidemia, unspecified: Secondary | ICD-10-CM | POA: Diagnosis not present

## 2016-12-22 DIAGNOSIS — Z7982 Long term (current) use of aspirin: Secondary | ICD-10-CM | POA: Diagnosis not present

## 2016-12-22 LAB — POCT GLYCOSYLATED HEMOGLOBIN (HGB A1C): HEMOGLOBIN A1C: 5.8

## 2016-12-22 MED ORDER — PREDNISONE 10 MG PO TABS: ORAL_TABLET | ORAL | 0 refills | Status: DC

## 2016-12-22 MED ORDER — MELOXICAM 15 MG PO TABS: 15.0000 mg | ORAL_TABLET | Freq: Every day | ORAL | 2 refills | Status: DC

## 2016-12-22 MED ORDER — FLUTICASONE FUROATE-VILANTEROL 200-25 MCG/INH IN AEPB: 1.0000 | INHALATION_SPRAY | Freq: Every day | RESPIRATORY_TRACT | 6 refills | Status: DC

## 2016-12-22 NOTE — Telephone Encounter (Signed)
Pt aware we do not currently have any breo samples.  Nothing further needed.

## 2016-12-22 NOTE — Telephone Encounter (Signed)
Pt called requesting a referral to have a colonoscopy done. Please f/u

## 2016-12-22 NOTE — Telephone Encounter (Signed)
Patient is returning phone call.  °

## 2016-12-22 NOTE — Telephone Encounter (Signed)
lmtcb for pt. We just need to let the pt know that we do not have Breo 100 samples at this time.

## 2016-12-22 NOTE — Telephone Encounter (Signed)
Left message for patient. x2

## 2016-12-22 NOTE — Telephone Encounter (Signed)
Patient returned phone call. °

## 2016-12-22 NOTE — Telephone Encounter (Signed)
Pt returning Ashley's call from today.

## 2016-12-22 NOTE — Patient Instructions (Signed)
Increase Breo to 200/50 1 puff daily. Work on getting a new mask for CPAP machine and use it.

## 2016-12-22 NOTE — Progress Notes (Signed)
Patient ID: Christian [name], male    DOB: July 28, 1960  MRN: [id number]  CC: Establish Care   Subjective: Christian [name] is a 56 y.o. male who presents to become establish with me as PCP. Last saw Dr. Clide Dales 08/2016 His concerns today include:  57 year old with history of OSA on CPAP, asthma/copd never smoked, HL, HTN, diabetes type 2, OA LT knee, chronic pain syndrome (surgery on neck and 2 surgeries lower back; last 2008).  1. Asthma: -request Prednisone rxn to keep on hand and use for future attack -compliant with Breo and ProAir once a day.  Uses neb about 4-5 x a wk (1-2 x on days when he has to use it). -+ cough at nights. No increase SOB -no itchy eyes/throat/no sneezing but does have hx of allergies. Using Flonase. Does not have Claritin.   2. OSA: not using CPAP. Too uncomfortable.  "I sleep with by mouth and it make me bloated. I was having cramps in my stomach." Wants to get just nasal mask instead of full facial mask that he currently has -gets supplies from [employer].   3. HL: tolerating Pravachol  4. DM:  Dx 12/2015 with A1C of 6.5 Tolerating Metformin Doing ok with eating habits -walks and does house whole chores regularly -His insurance sent and nurse or a nurse practitioner to his home who did some screening evaluations. States that the person looked into his eyes.  5. OA LT knee -bothers him more when walking -chronic swelling.  Reports being told in the past that he needs knee replacement but he wants to hold off on that for now. Out of meloxicam   Patient Active Problem List   Diagnosis Date Noted  . Chronic pain syndrome 04/02/2016  . Intrinsic asthma 03/25/2016  . Asthma with acute exacerbation 01/24/2016  . Essential hypertension 01/23/2016  . Hyperlipidemia 01/23/2016  . Secondhand smoke exposure 01/23/2016  . Diabetes mellitus with neuropathy ([place]) 01/23/2016  . Acute bronchitis   . Musculoskeletal chest pain 01/22/2016     Current Outpatient  Prescriptions on File Prior to Visit  Medication Sig Dispense Refill  . albuterol (PROAIR HFA) 108 (90 Base) MCG/ACT inhaler Inhale 1-2 puffs into the lungs every 6 (six) hours as needed for wheezing or shortness of breath. 1 Inhaler 2  . amLODipine (NORVASC) 10 MG tablet Take 1 tablet (10 mg total) by mouth daily. 90 tablet 3  . aspirin EC 81 MG tablet Take 1 tablet (81 mg total) by mouth daily. 90 tablet 2  . Blood Glucose Monitoring Suppl (ONE TOUCH ULTRA SYSTEM KIT) w/Device KIT Use as directed 1 each 0  . carisoprodol (SOMA) 350 MG tablet Take 1 tablet (350 mg total) by mouth 4 (four) times daily as needed for muscle spasms. 50 tablet 0  . cyclobenzaprine (FLEXERIL) 10 MG tablet Take 1 tablet (10 mg total) by mouth 3 (three) times daily as needed for muscle spasms. 50 tablet 1  . fluticasone (FLONASE) 50 MCG/ACT nasal spray Place 2 sprays into both nostrils daily. 16 g 6  . fluticasone furoate-vilanterol (BREO ELLIPTA) 100-25 MCG/INH AEPB Inhale 1 puff into the lungs daily. 3 each 1  . gabapentin (NEURONTIN) 300 MG capsule Take 1 capsule (300 mg total) by mouth 4 (four) times daily. 120 capsule 3  . glucose blood (TRUE METRIX BLOOD GLUCOSE TEST) test strip Use as instructed 100 each 12  . ipratropium-albuterol (DUONEB) 0.5-2.5 (3) MG/3ML SOLN Take 3 mLs by nebulization every 4 (four) hours as needed.  360 mL 2  . losartan-hydrochlorothiazide (HYZAAR) 50-12.5 MG tablet Take 1 tablet by mouth daily. 90 tablet 3  . metFORMIN (GLUCOPHAGE XR) 500 MG 24 hr tablet Take 1 tablet (500 mg total) by mouth daily with breakfast. 90 tablet 3  . pravastatin (PRAVACHOL) 20 MG tablet Take 1 tablet (20 mg total) by mouth daily. 90 tablet 3  . sodium chloride (OCEAN) 0.65 % SOLN nasal spray Place 1 spray into both nostrils as needed for congestion. 30 mL 2  . TRUEPLUS LANCETS 28G MISC Check glucose 2 times a day 100 each 3  . loratadine (CLARITIN) 10 MG tablet Take 1 tablet (10 mg total) by mouth daily. (Patient  not taking: Reported on 12/22/2016) 30 tablet 11  . meloxicam (MOBIC) 15 MG tablet Take 1 tablet (15 mg total) by mouth daily. Take with food (Patient not taking: Reported on 12/22/2016) 30 tablet 2  . naproxen (NAPROSYN) 500 MG tablet Take 1 tablet (500 mg total) by mouth 3 (three) times daily with meals. (Patient not taking: Reported on 12/22/2016) 90 tablet 2   No current facility-administered medications on file prior to visit.     Allergies  Allergen Reactions  . Phenergan [Promethazine Hcl] Itching    Social History   Social History  . Marital status: Married    Spouse name: N/A  . Number of children: N/A  . Years of education: N/A   Occupational History  . Not on file.   Social History Main Topics  . Smoking status: Passive Smoke Exposure - Never Smoker  . Smokeless tobacco: Never Used  . Alcohol use No  . Drug use: No  . Sexual activity: Yes   Other Topics Concern  . Not on file   Social History Narrative  . No narrative on file    Family History  Problem Relation Age of Onset  . Hypertension Mother   . Hypertension Father   . Heart Problems Brother        Heart transplant, pt does not know cause     Past Surgical History:  Procedure Laterality Date  . ANTERIOR CERVICAL DECOMP/DISCECTOMY FUSION  ~ 1995   "titanium"  . APPENDECTOMY  ~ 1977  . BACK SURGERY    . INGUINAL HERNIA REPAIR Right ~ 2000  . LUMBAR DISC SURGERY  ~ 2000; ~2006  . NASAL SINUS SURGERY  ~ 2007    ROS: Review of Systems negative except as stated above.   PHYSICAL EXAM: BP 123/81 (BP Location: Left Arm, Patient Position: Sitting, Cuff Size: Normal)   Pulse 75   Temp 98.4 F (36.9 C) (Oral)   Resp 18   Ht _0  (1.753 m)   Wt 169 lb (76.7 kg)   SpO2 97%   BMI 24.96 kg/m   Wt Readings from Last 3 Encounters:  12/22/16 169 lb (76.7 kg)  12/11/16 169 lb 6.4 oz (76.8 kg)  10/02/16 171 lb 6.4 oz (77.7 kg)    Physical Exam  General appearance - alert, well appearing,  middle-age African-American male and in no distress Mental status - alert, oriented to person, place, and time, normal mood, behavior, speech, dress, motor activity, and thought processes Eyes - mild ptosis RT eye lid Mouth - mucous membranes moist, pharynx normal without lesions Neck - supple, no significant adenopathy Chest - clear to auscultation, no wheezes, rales or rhonchi, symmetric air entry Heart - normal rate, regular rhythm, normal S1, S2, no murmurs, rubs, clicks or gallops Musculoskeletal - LT knee: Mild  to moderate edema. Positive effusion. Mild discomfort on passive movement. Extremities - no LE edema  Depression screen [facility] 2/9 12/22/2016 12/11/2016 12/11/2016 07/16/2016 04/02/2016  Decreased Interest 0 0 0 0 3  Down, Depressed, Hopeless 0 0 0 0 0  PHQ - 2 Score 0 0 0 0 3  Altered sleeping 0 0 0 - 0  Tired, decreased energy 0 0 0 - 0  Change in appetite 0 0 0 - 0  Feeling bad or failure about yourself  0 0 0 - 0  Trouble concentrating 0 0 0 - 0  Moving slowly or fidgety/restless 0 0 0 - 0  Suicidal thoughts 0 0 0 - 0  PHQ-9 Score 0 0 0 - 3    Results for orders placed or performed in visit on 12/22/16  HgB A1c  Result Value Ref Range   Hemoglobin A1C 5.8     ASSESSMENT AND PLAN: 1. Moderate persistent asthma without complication -Not well controlled. We will increase Breo from 100/25 to 200/25 -Given prescription for prednisone taper to use when needed  - predniSONE (DELTASONE) 10 MG tablet; 3 tabs PO daily x 2 days then 2 tabs x 2 days then 1 tab x 2 days  Dispense: 12 tablet; Refill: 0 - fluticasone furoate-vilanterol (BREO ELLIPTA) 200-25 MCG/INH AEPB; Inhale 1 puff into the lungs daily.  Dispense: 1 each; Refill: 6  2. Diabetes tpe 2 Well-controlled. Continue metformin. Stressed the importance of healthy eating and regular exercise. Encouraged to have dilated eye exam for retinopathy screening.  - HgB A1c  3. OSA (obstructive sleep apnea) -Encourage use of  CPAP. He will contact medical supply company about getting a nasal mask which he feels he may tolerate better  4. Hyperlipidemia, unspecified hyperlipidemia type -continue Pravachol  5. Primary osteoarthritis of left knee Decline ortho referral - meloxicam (MOBIC) 15 MG tablet; Take 1 tablet (15 mg total) by mouth daily. Take with food  Dispense: 30 tablet; Refill: 2  6. Need for immunization against influenza - Flu Vaccine QUAD 36+ mos IM     Patient was given the opportunity to ask questions.  Patient verbalized understanding of the plan and was able to repeat key elements of the plan.   Orders Placed This Encounter  Procedures  . HgB A1c     Requested Prescriptions    No prescriptions requested or ordered in this encounter    No Follow-up on file.  Karle Plumber, MD, FACP

## 2016-12-22 NOTE — Telephone Encounter (Signed)
We have no samples of the breo 100.  I have called and lmomtcb x 1 for the pt.

## 2016-12-23 ENCOUNTER — Encounter (INDEPENDENT_AMBULATORY_CARE_PROVIDER_SITE_OTHER): Payer: Self-pay

## 2016-12-25 ENCOUNTER — Telehealth: Payer: Self-pay | Admitting: Adult Health

## 2016-12-25 NOTE — Telephone Encounter (Signed)
OK by me to increase to 200 and give sample

## 2016-12-25 NOTE — Telephone Encounter (Signed)
Last ov 5.31.18 w/ TP and Breo 100 restarted with sample and Rx with recs to follow up in 2-3 months >> upcoming appt with TP on 9.11.18  Pt seen by PCP on 8.27.18 for ov and apparently was currently have an exacerbation - pred taper sent and Breo was increased to 200: ASSESSMENT AND PLAN: 1. Moderate persistent asthma without complication -Not well controlled. We will increase Breo from 100/25 to 200/25 -Given prescription for prednisone taper to use when needed  - predniSONE (DELTASONE) 10 MG tablet; 3 tabs PO daily x 2 days then 2 tabs x 2 days then 1 tab x 2 days  Dispense: 12 tablet; Refill: 0 - fluticasone furoate-vilanterol (BREO ELLIPTA) 200-25 MCG/INH AEPB; Inhale 1 puff into the lungs daily.  Dispense: 1 each; Refill: 6  =====================================================================================  Pt here with spouse (whom has an appt with SG) and requesting samples of Breo  We have no Breo 100 samples and #1 Breo 200  Asked Jonelle Sidle to explain to patient that because his medication was increased by another provider, would be preferable to have BQ's authorization to dispense the higher dose of Breo 200  BQ please advise, thank you

## 2016-12-25 NOTE — Telephone Encounter (Signed)
Spoke with patient's wife Christian [name]. Advised her that BQ stated it was ok for him to be on Breo 200. Since we do not have samples of Breo at this time, I advised her to call the office next Tuesday to see if we have any. She verbalized understanding. Nothing else needed at time of call.

## 2016-12-26 NOTE — Telephone Encounter (Signed)
Contacted pt to see if he is requesting a colonoscopy because he has had one done. Pt didn't answer lvm asking pt to give [place] a call to let us know

## 2016-12-30 ENCOUNTER — Telehealth: Payer: Self-pay | Admitting: Pulmonary Disease

## 2016-12-30 MED ORDER — FLUTICASONE FUROATE-VILANTEROL 200-25 MCG/INH IN AEPB: 1.0000 | INHALATION_SPRAY | Freq: Every day | RESPIRATORY_TRACT | 0 refills | Status: DC

## 2016-12-30 NOTE — Telephone Encounter (Signed)
Pt wife called to inform the PCP that her husband never had a Colonoscopy and he need one, can you please sent a referral for him, thanks

## 2016-12-30 NOTE — Telephone Encounter (Signed)
Spoke with patient. Advised him that I did not see where anyone from this office had called him. He did call in last week requesting samples of Breo 200 and we did not them. Advised patient that we have them now and that I placed 2 samples up front for him. He verbalized understanding. Nothing else needed at time of call.

## 2016-12-30 NOTE — Telephone Encounter (Signed)
Will forward to pcp

## 2017-01-06 ENCOUNTER — Ambulatory Visit: Payer: Medicare Other | Admitting: Adult Health

## 2017-02-23 ENCOUNTER — Encounter: Payer: Self-pay | Admitting: Internal Medicine

## 2017-02-23 ENCOUNTER — Ambulatory Visit: Payer: Medicare Other | Attending: Internal Medicine | Admitting: Internal Medicine

## 2017-02-23 VITALS — BP 140/82 | HR 80 | Temp 98.2°F | Resp 16 | Wt 173.0 lb

## 2017-02-23 DIAGNOSIS — J4540 Moderate persistent asthma, uncomplicated: Secondary | ICD-10-CM | POA: Diagnosis not present

## 2017-02-23 DIAGNOSIS — J449 Chronic obstructive pulmonary disease, unspecified: Secondary | ICD-10-CM | POA: Diagnosis not present

## 2017-02-23 DIAGNOSIS — M25562 Pain in left knee: Secondary | ICD-10-CM | POA: Insufficient documentation

## 2017-02-23 DIAGNOSIS — J069 Acute upper respiratory infection, unspecified: Secondary | ICD-10-CM

## 2017-02-23 DIAGNOSIS — E1140 Type 2 diabetes mellitus with diabetic neuropathy, unspecified: Secondary | ICD-10-CM | POA: Diagnosis not present

## 2017-02-23 DIAGNOSIS — J8283 Eosinophilic asthma: Secondary | ICD-10-CM | POA: Insufficient documentation

## 2017-02-23 DIAGNOSIS — Z79899 Other long term (current) drug therapy: Secondary | ICD-10-CM | POA: Insufficient documentation

## 2017-02-23 DIAGNOSIS — Z7982 Long term (current) use of aspirin: Secondary | ICD-10-CM | POA: Diagnosis not present

## 2017-02-23 DIAGNOSIS — M1712 Unilateral primary osteoarthritis, left knee: Secondary | ICD-10-CM

## 2017-02-23 DIAGNOSIS — E1340 Other specified diabetes mellitus with diabetic neuropathy, unspecified: Secondary | ICD-10-CM | POA: Diagnosis not present

## 2017-02-23 DIAGNOSIS — G894 Chronic pain syndrome: Secondary | ICD-10-CM | POA: Diagnosis not present

## 2017-02-23 DIAGNOSIS — E785 Hyperlipidemia, unspecified: Secondary | ICD-10-CM | POA: Diagnosis not present

## 2017-02-23 DIAGNOSIS — E119 Type 2 diabetes mellitus without complications: Secondary | ICD-10-CM | POA: Diagnosis not present

## 2017-02-23 DIAGNOSIS — Z7984 Long term (current) use of oral hypoglycemic drugs: Secondary | ICD-10-CM | POA: Diagnosis not present

## 2017-02-23 DIAGNOSIS — I10 Essential (primary) hypertension: Secondary | ICD-10-CM

## 2017-02-23 DIAGNOSIS — Z7722 Contact with and (suspected) exposure to environmental tobacco smoke (acute) (chronic): Secondary | ICD-10-CM | POA: Diagnosis not present

## 2017-02-23 LAB — GLUCOSE, POCT (MANUAL RESULT ENTRY): POC Glucose: 96 mg/dl (ref 70–99)

## 2017-02-23 MED ORDER — MONTELUKAST SODIUM 10 MG PO TABS: 10.0000 mg | ORAL_TABLET | Freq: Every day | ORAL | 3 refills | Status: DC

## 2017-02-23 NOTE — Progress Notes (Signed)
Patient ID: Christian [name], male    DOB: [birth year]  MRN: [id number]  CC: Asthma and Knee Pain   Subjective: Christian [name] is a 56 y.o. male who presents for chronic disease management, two-month follow-up. His concerns today include:  56 year old with history of OSA on CPAP, asthma/copd never smoked, HL, HTN, diabetes type 2, OA LT knee, chronic pain syndrome (surgery on neck and 2 surgeries lower back; last 2008).  1. Asthma: -c/o having a cold x5 days. Increase cough nonproductive, little SOB, no fever. Nasal mucus yellow -Did neb treatment last evening -does neb treatments 2-3 x a day even on good days. Uses ProAir several times a day -does have problems with allergies.  Uses Flonase nasal spray  2. LT knee OA: Not getting any better -Stiffness in the mornings which gets better since he starts moving around -pain with walking "but I try to tuff it out." -still swells -He is on meloxicam. Offered referral to orthopedics on last visit which he declined but is now ready to move forward with that  3. HTN: compliant with medications and salt restriction  Patient Active Problem List   Diagnosis Date Noted  . Chronic pain syndrome 04/02/2016  . Intrinsic asthma 03/25/2016  . Asthma with acute exacerbation 01/24/2016  . Essential hypertension 01/23/2016  . Hyperlipidemia 01/23/2016  . Secondhand smoke exposure 01/23/2016  . Diabetes mellitus with neuropathy ([place]) 01/23/2016  . Acute bronchitis   . Musculoskeletal chest pain 01/22/2016     Current Outpatient Prescriptions on File Prior to Visit  Medication Sig Dispense Refill  . albuterol (PROAIR HFA) 108 (90 Base) MCG/ACT inhaler Inhale 1-2 puffs into the lungs every 6 (six) hours as needed for wheezing or shortness of breath. 1 Inhaler 2  . amLODipine (NORVASC) 10 MG tablet Take 1 tablet (10 mg total) by mouth daily. 90 tablet 3  . aspirin EC 81 MG tablet Take 1 tablet (81 mg total) by mouth daily. 90 tablet 2  . Blood Glucose  Monitoring Suppl (ONE TOUCH ULTRA SYSTEM KIT) w/Device KIT Use as directed 1 each 0  . carisoprodol (SOMA) 350 MG tablet Take 1 tablet (350 mg total) by mouth 4 (four) times daily as needed for muscle spasms. 50 tablet 0  . cyclobenzaprine (FLEXERIL) 10 MG tablet Take 1 tablet (10 mg total) by mouth 3 (three) times daily as needed for muscle spasms. 50 tablet 1  . fluticasone (FLONASE) 50 MCG/ACT nasal spray Place 2 sprays into both nostrils daily. 16 g 6  . fluticasone furoate-vilanterol (BREO ELLIPTA) 200-25 MCG/INH AEPB Inhale 1 puff into the lungs daily. 1 each 6  . fluticasone furoate-vilanterol (BREO ELLIPTA) 200-25 MCG/INH AEPB Inhale 1 puff into the lungs daily. 2 each 0  . gabapentin (NEURONTIN) 300 MG capsule Take 1 capsule (300 mg total) by mouth 4 (four) times daily. 120 capsule 3  . glucose blood (TRUE METRIX BLOOD GLUCOSE TEST) test strip Use as instructed 100 each 12  . ipratropium-albuterol (DUONEB) 0.5-2.5 (3) MG/3ML SOLN Take 3 mLs by nebulization every 4 (four) hours as needed. 360 mL 2  . losartan-hydrochlorothiazide (HYZAAR) 50-12.5 MG tablet Take 1 tablet by mouth daily. 90 tablet 3  . meloxicam (MOBIC) 15 MG tablet Take 1 tablet (15 mg total) by mouth daily. Take with food 30 tablet 2  . metFORMIN (GLUCOPHAGE XR) 500 MG 24 hr tablet Take 1 tablet (500 mg total) by mouth daily with breakfast. 90 tablet 3  . pravastatin (PRAVACHOL) 20 MG tablet Take  1 tablet (20 mg total) by mouth daily. 90 tablet 3  . sodium chloride (OCEAN) 0.65 % SOLN nasal spray Place 1 spray into both nostrils as needed for congestion. 30 mL 2  . TRUEPLUS LANCETS 28G MISC Check glucose 2 times a day 100 each 3   No current facility-administered medications on file prior to visit.     Allergies  Allergen Reactions  . Phenergan [Promethazine Hcl] Itching    Social History   Social History  . Marital status: Married    Spouse name: N/A  . Number of children: N/A  . Years of education: N/A    Occupational History  . Not on file.   Social History Main Topics  . Smoking status: Passive Smoke Exposure - Never Smoker  . Smokeless tobacco: Never Used  . Alcohol use No  . Drug use: No  . Sexual activity: Yes   Other Topics Concern  . Not on file   Social History Narrative  . No narrative on file    Family History  Problem Relation Age of Onset  . Hypertension Mother   . Hypertension Father   . Heart Problems Brother        Heart transplant, pt does not know cause     Past Surgical History:  Procedure Laterality Date  . ANTERIOR CERVICAL DECOMP/DISCECTOMY FUSION  ~ 1995   "titanium"  . APPENDECTOMY  ~ 1977  . BACK SURGERY    . INGUINAL HERNIA REPAIR Right ~ 2000  . LUMBAR DISC SURGERY  ~ 2000; ~2006  . NASAL SINUS SURGERY  ~ 2007    ROS: Review of Systems Negative except as stated above PHYSICAL EXAM: BP 140/82   Pulse 80   Temp 98.2 F (36.8 C) (Oral)   Resp 16   Wt 173 lb (78.5 kg)   SpO2 94%   BMI 25.55 kg/m   140/82 Physical Exam General appearance - alert, well appearing, and in no distress Mental status - alert, oriented to person, place, and time, normal mood, behavior, speech, dress, motor activity, and thought processes Nose - normal and patent, no erythema, discharge or polyps Mouth - mucous membranes moist, pharynx normal without lesions Neck - supple, no significant adenopathy Chest - clear to auscultation, no wheezes, rales or rhonchi, symmetric air entry Heart - normal rate, regular rhythm, normal S1, S2, no murmurs, rubs, clicks or gallops Extremities -  no lower extremity edema MSK - left knee: Mild to moderate edema. Mild tenderness lower medial aspect. Mild discomfort with passive range of motion Results for orders placed or performed in visit on 02/23/17  POCT glucose (manual entry)  Result Value Ref Range   POC Glucose 96 70 - 99 mg/dl   Lab Results  Component Value Date   HGBA1C 5.8 12/22/2016     ASSESSMENT AND  PLAN: 1. Moderate persistent asthma without complication -Not as controlled as he should be on Breo. Has frequent use of rescue inhaler and nebulizer. Add Singulair - montelukast (SINGULAIR) 10 MG tablet; Take 1 tablet (10 mg total) by mouth at bedtime.  Dispense: 30 tablet; Refill: 3  2. Primary osteoarthritis of left knee -Would like for him to get x-rays done as ordered on last visit. - AMB referral to orthopedics  3. Controlled type 2 diabetes mellitus without complication, without long-term current use of insulin (HCC) Controlled - POCT glucose (manual entry)  4. Essential hypertension At goal. Continue amlodipine and losartan/HCTZ  5. Upper respiratory tract infection, unspecified type Conservative  treatment. He seems to be on the tail end of this   Patient was given the opportunity to ask questions.  Patient verbalized understanding of the plan and was able to repeat key elements of the plan.   Orders Placed This Encounter  Procedures  . AMB referral to orthopedics  . POCT glucose (manual entry)     Requested Prescriptions   Signed Prescriptions Disp Refills  . montelukast (SINGULAIR) 10 MG tablet 30 tablet 3    Sig: Take 1 tablet (10 mg total) by mouth at bedtime.    Return in about 3 months (around 05/26/2017).  Karle Plumber, MD, FACP

## 2017-02-23 NOTE — Patient Instructions (Signed)
Start Singular daily to help better control your asthma.  You have been referred to orthopedics.  Do not forget to go by [facility] to have x-rays done.

## 2017-02-26 ENCOUNTER — Ambulatory Visit (INDEPENDENT_AMBULATORY_CARE_PROVIDER_SITE_OTHER): Payer: Self-pay | Admitting: Orthopaedic Surgery

## 2017-03-03 ENCOUNTER — Ambulatory Visit (INDEPENDENT_AMBULATORY_CARE_PROVIDER_SITE_OTHER): Payer: Medicare Other | Admitting: Orthopaedic Surgery

## 2017-03-03 ENCOUNTER — Ambulatory Visit (INDEPENDENT_AMBULATORY_CARE_PROVIDER_SITE_OTHER): Payer: Medicare Other

## 2017-03-03 DIAGNOSIS — M1712 Unilateral primary osteoarthritis, left knee: Secondary | ICD-10-CM | POA: Diagnosis not present

## 2017-03-03 MED ORDER — DICLOFENAC SODIUM 75 MG PO TBEC: 75.0000 mg | DELAYED_RELEASE_TABLET | Freq: Two times a day (BID) | ORAL | 2 refills | Status: DC

## 2017-03-03 MED ORDER — LIDOCAINE HCL 1 % IJ SOLN
2.0000 mL | INTRAMUSCULAR | Status: AC | PRN
Start: 2017-03-03 — End: 2017-03-03
  Administered 2017-03-03: 2 mL

## 2017-03-03 MED ORDER — METHYLPREDNISOLONE ACETATE 40 MG/ML IJ SUSP
40.0000 mg | INTRAMUSCULAR | Status: AC | PRN
  Administered 2017-03-03: 40 mg via INTRA_ARTICULAR

## 2017-03-03 MED ORDER — BUPIVACAINE HCL 0.5 % IJ SOLN
2.0000 mL | INTRAMUSCULAR | Status: AC | PRN
  Administered 2017-03-03: 2 mL via INTRA_ARTICULAR

## 2017-03-03 MED ORDER — DICLOFENAC SODIUM 1 % TD GEL: 2.0000 g | Freq: Four times a day (QID) | TRANSDERMAL | 5 refills | Status: DC

## 2017-03-03 NOTE — Progress Notes (Signed)
Office Visit Note   Patient: Christian [name]           Date of Birth: 1961/03/22           MRN: [id number] Visit Date: 03/03/2017              Requested by: Ladell Pier, MD [name] [place], [place] [address] PCP: Ladell Pier, MD   Assessment & Plan: Visit Diagnoses:  1. Primary osteoarthritis of left knee     Plan: Impression is end-stage degenerative joint disease with fixed varus deformity.  I aspirated 50 cc of joint fluid and injected this with cortisone.  His wife recently had open heart surgery so he will let us know when he is ready to undergo knee replacement.  We did discuss the risk benefits alternatives to surgery he understands and wished to proceed.  Prescription for diclofenac and Voltaren gel.  Follow-Up Instructions: Return if symptoms worsen or fail to improve.   Orders:  Orders Placed This Encounter  Procedures  . XR KNEE 3 VIEW LEFT   Meds ordered this encounter  Medications  . diclofenac sodium (VOLTAREN) 1 % GEL    Sig: Apply 2 g 4 (four) times daily topically.    Dispense:  1 Tube    Refill:  5  . diclofenac (VOLTAREN) 75 MG EC tablet    Sig: Take 1 tablet (75 mg total) 2 (two) times daily by mouth.    Dispense:  30 tablet    Refill:  2      Procedures: Large Joint Inj: L knee on 03/03/2017 9:44 AM Details: 22 G needle Medications: 2 mL bupivacaine 0.5 %; 2 mL lidocaine 1 %; 40 mg methylPREDNISolone acetate 40 MG/ML Aspirate: 50 mL clear; sent for lab analysis Outcome: tolerated well, no immediate complications Patient was prepped and draped in the usual sterile fashion.       Clinical Data: No additional findings.   Subjective: No chief complaint on file.   Patient is a very pleasant 55 year old gentleman who comes in with chronic left knee pain with worsening varus deformity for years.  He has had a previous cortisone injection without any significant relief.  He walks with a cane.  He has significant difficulty with  ADLs and quality of life.  He has constant night pain.  He has well-controlled diabetes and well-controlled COPD and asthma.  He has obstructive sleep apnea but not compliant with CPAP pain does not radiate from his left knee he denies any numbness and tingling.  He does have significant pain and limping with ambulation.    Review of Systems  Constitutional: Negative.   All other systems reviewed and are negative.    Objective: Vital Signs: There were no vitals taken for this visit.  Physical Exam  Constitutional: He is oriented to person, place, and time. He appears well-developed and well-nourished.  HENT:  Head: Normocephalic and atraumatic.  Eyes: Pupils are equal, round, and reactive to light.  Neck: Neck supple.  Pulmonary/Chest: Effort normal.  Abdominal: Soft.  Musculoskeletal: Normal range of motion.  Neurological: He is alert and oriented to person, place, and time.  Skin: Skin is warm.  Psychiatric: He has a normal mood and affect. His behavior is normal. Judgment and thought content normal.  Nursing note and vitals reviewed.   Ortho Exam Left knee exam shows a fixed varus deformity.  Moderate joint effusion.  Collaterals and cruciates are stable.  Medial joint line tenderness. Specialty Comments:  No specialty comments available.  Imaging: No results found.   PMFS History: Patient Active Problem List   Diagnosis Date Noted  . Moderate persistent asthma without complication 00/71/2197  . Primary osteoarthritis of left knee 02/23/2017  . Chronic pain syndrome 04/02/2016  . Intrinsic asthma 03/25/2016  . Essential hypertension 01/23/2016  . Hyperlipidemia 01/23/2016  . Secondhand smoke exposure 01/23/2016  . Diabetes mellitus with neuropathy ([place]) 01/23/2016  . Musculoskeletal chest pain 01/22/2016   Past Medical History:  Diagnosis Date  . Arthritis    "left knee" (01/22/2016)  . Asthma   . Chronic lower back pain   . COPD (chronic obstructive  pulmonary disease) ([place])   . High cholesterol   . Hypertension   . Neuropathy   . OSA on CPAP   . Secondhand smoke exposure   . Type II diabetes mellitus ([place]) dx'd 01/17/2016    Family History  Problem Relation Age of Onset  . Hypertension Mother   . Hypertension Father   . Heart Problems Brother        Heart transplant, pt does not know cause     Past Surgical History:  Procedure Laterality Date  . ANTERIOR CERVICAL DECOMP/DISCECTOMY FUSION  ~ 1995   "titanium"  . APPENDECTOMY  ~ 1977  . BACK SURGERY    . INGUINAL HERNIA REPAIR Right ~ 2000  . LUMBAR DISC SURGERY  ~ 2000; ~2006  . NASAL SINUS SURGERY  ~ 2007   Social History   Occupational History  . Not on file  Tobacco Use  . Smoking status: Passive Smoke Exposure - Never Smoker  . Smokeless tobacco: Never Used  Substance and Sexual Activity  . Alcohol use: No  . Drug use: No  . Sexual activity: Yes

## 2017-03-09 ENCOUNTER — Telehealth: Payer: Self-pay | Admitting: Internal Medicine

## 2017-03-09 NOTE — Telephone Encounter (Signed)
Pt called call to request a referral for a Colonoscopy he has [employer] please follow up,

## 2017-03-12 NOTE — Telephone Encounter (Signed)
Sent Referral to [facility] Ph# [phone] #[phone] [place] [address] .They will contact the patient to schedule an appointment

## 2017-03-24 ENCOUNTER — Ambulatory Visit: Payer: Medicare Other | Admitting: Internal Medicine

## 2017-03-31 ENCOUNTER — Other Ambulatory Visit: Payer: Self-pay | Admitting: Pharmacist

## 2017-03-31 MED ORDER — ALBUTEROL SULFATE HFA 108 (90 BASE) MCG/ACT IN AERS: 1.0000 | INHALATION_SPRAY | Freq: Four times a day (QID) | RESPIRATORY_TRACT | 2 refills | Status: DC | PRN

## 2017-03-31 MED ORDER — ONETOUCH ULTRA 2 W/DEVICE KIT: PACK | 0 refills | Status: DC

## 2017-03-31 MED ORDER — GABAPENTIN 300 MG PO CAPS: 300.0000 mg | ORAL_CAPSULE | Freq: Four times a day (QID) | ORAL | 2 refills | Status: DC

## 2017-04-29 ENCOUNTER — Encounter: Payer: Self-pay | Admitting: Physician Assistant

## 2017-04-29 ENCOUNTER — Ambulatory Visit (INDEPENDENT_AMBULATORY_CARE_PROVIDER_SITE_OTHER): Payer: Medicare Other | Admitting: Physician Assistant

## 2017-04-29 VITALS — BP 142/98 | HR 76 | Ht 69.0 in | Wt 175.0 lb

## 2017-04-29 DIAGNOSIS — Z79899 Other long term (current) drug therapy: Secondary | ICD-10-CM | POA: Diagnosis not present

## 2017-04-29 DIAGNOSIS — Z01810 Encounter for preprocedural cardiovascular examination: Secondary | ICD-10-CM | POA: Diagnosis not present

## 2017-04-29 DIAGNOSIS — E119 Type 2 diabetes mellitus without complications: Secondary | ICD-10-CM

## 2017-04-29 DIAGNOSIS — J449 Chronic obstructive pulmonary disease, unspecified: Secondary | ICD-10-CM

## 2017-04-29 DIAGNOSIS — G4733 Obstructive sleep apnea (adult) (pediatric): Secondary | ICD-10-CM

## 2017-04-29 DIAGNOSIS — Z9989 Dependence on other enabling machines and devices: Secondary | ICD-10-CM | POA: Diagnosis not present

## 2017-04-29 DIAGNOSIS — E785 Hyperlipidemia, unspecified: Secondary | ICD-10-CM | POA: Diagnosis not present

## 2017-04-29 DIAGNOSIS — I10 Essential (primary) hypertension: Secondary | ICD-10-CM

## 2017-04-29 NOTE — Patient Instructions (Signed)
Medication Instructions:   No changes  Labwork:   Fasting cholesterol and liver function tests today  Testing/Procedures:  none  Follow-Up:  With Dr. Debara Pickett in 1 year  You are cleared from a cardiovascular standpoint for your upcoming orthopedic procedure.  If you need a refill on your cardiac medications before your next appointment, please call your pharmacy.

## 2017-04-29 NOTE — Progress Notes (Signed)
Cardiology Office Note    Date:  05/01/2017   ID:  Christian [name], DOB 1960/08/14, MRN [id number]  PCP:  Ladell Pier, MD  Cardiologist:  Dr. Debara Pickett  Chief Complaint  Patient presents with  . Follow-up    13 months. Seen for Dr. Debara Pickett  . Edema    History of Present Illness:  Christian [name] is a 57 y.o. male with PMH of COPD from 2nd hand smoke, HLD, HTN, Asthma, OSA on CPAP, and type II DM (diagnosed in Sept 2017). He was evaluated for chest pain on 01/23/2016. Prior to that, he had cold-like symptoms, followed by intermittent chest discomfort. It was felt patient had COPD/asthma exacerbation. Serial troponin were all negative 3 except for a single 0.04 value. Echocardiogram obtained on 01/23/2016 showed EF 65-70%, mild LVH, grade 1 diastolic dysfunction. Given reassuring echo result, we planned outpatient follow-up and stress testing when his respiratory symptom has resolved.  Treadmill Myoview in December was low risk.  PFT obtained in January 2018 consistent with mild obstructive disease and mild decrease in diffusion capacity, overall consistent with COPD.  Patient presents back for cardiology office visit.  He has not had any significant chest discomfort.  According to patient, he has never smoked in the past, however he has been experienced to secondhand smoke extensively throughout his early life.  He is still able to do everyday activity without significant shortness of breath.  He has upcoming left knee surgery by Dr. Erlinda Hong, given the fact she never had coronary artery disease and had a normal stress test last year, he would be at low risk to proceed with surgery.  As far as lower extremity edema, it is really not bad on physical exam.  I do not think any treatment is necessary.   Past Medical History:  Diagnosis Date  . Arthritis    "left knee" (01/22/2016)  . Asthma   . Chronic lower back pain   . COPD (chronic obstructive pulmonary disease) ([place])   . High cholesterol     . Hypertension   . Neuropathy   . OSA on CPAP   . Secondhand smoke exposure   . Type II diabetes mellitus ([place]) dx'd 01/17/2016    Past Surgical History:  Procedure Laterality Date  . ANTERIOR CERVICAL DECOMP/DISCECTOMY FUSION  ~ 1995   "titanium"  . APPENDECTOMY  ~ 1977  . BACK SURGERY    . INGUINAL HERNIA REPAIR Right ~ 2000  . LUMBAR DISC SURGERY  ~ 2000; ~2006  . NASAL SINUS SURGERY  ~ 2007    Current Medications: Outpatient Medications Prior to Visit  Medication Sig Dispense Refill  . albuterol (PROAIR HFA) 108 (90 Base) MCG/ACT inhaler Inhale 1-2 puffs into the lungs every 6 (six) hours as needed for wheezing or shortness of breath. 1 Inhaler 2  . albuterol (PROVENTIL) (5 MG/ML) 0.5% nebulizer solution Take 2.5 mg by nebulization every 6 (six) hours as needed for wheezing or shortness of breath.    Marland Kitchen amLODipine (NORVASC) 10 MG tablet Take 1 tablet (10 mg total) by mouth daily. 90 tablet 3  . aspirin EC 81 MG tablet Take 1 tablet (81 mg total) by mouth daily. 90 tablet 2  . Blood Glucose Monitoring Suppl (ONE TOUCH ULTRA 2) w/Device KIT Use as directed 3 times daily E11.9 1 each 0  . cyclobenzaprine (FLEXERIL) 10 MG tablet Take 1 tablet (10 mg total) by mouth 3 (three) times daily as needed for muscle spasms. 50 tablet 1  .  diclofenac sodium (VOLTAREN) 1 % GEL Apply 2 g 4 (four) times daily topically. (Patient taking differently: Apply 2 g topically 4 (four) times daily as needed (FOR PAIN.). ) 1 Tube 5  . fluticasone (FLONASE) 50 MCG/ACT nasal spray Place 2 sprays into both nostrils daily. 16 g 6  . gabapentin (NEURONTIN) 300 MG capsule Take 1 capsule (300 mg total) by mouth 4 (four) times daily. (Patient taking differently: Take 300 mg by mouth 3 (three) times daily. ) 120 capsule 2  . glucose blood (TRUE METRIX BLOOD GLUCOSE TEST) test strip Use as instructed 100 each 12  . ipratropium-albuterol (DUONEB) 0.5-2.5 (3) MG/3ML SOLN Take 3 mLs by nebulization every 4 (four) hours  as needed. 360 mL 2  . losartan-hydrochlorothiazide (HYZAAR) 50-12.5 MG tablet Take 1 tablet by mouth daily. (Patient taking differently: Take 1 tablet by mouth every evening. ) 90 tablet 3  . meloxicam (MOBIC) 15 MG tablet Take 1 tablet (15 mg total) by mouth daily. Take with food 30 tablet 2  . metFORMIN (GLUCOPHAGE XR) 500 MG 24 hr tablet Take 1 tablet (500 mg total) by mouth daily with breakfast. 90 tablet 3  . montelukast (SINGULAIR) 10 MG tablet Take 1 tablet (10 mg total) by mouth at bedtime. 30 tablet 3  . pravastatin (PRAVACHOL) 20 MG tablet Take 1 tablet (20 mg total) by mouth daily. (Patient taking differently: Take 20 mg by mouth at bedtime. ) 90 tablet 3  . sodium chloride (OCEAN) 0.65 % SOLN nasal spray Place 1 spray into both nostrils as needed for congestion. 30 mL 2  . TRUEPLUS LANCETS 28G MISC Check glucose 2 times a day 100 each 3  . diclofenac (VOLTAREN) 75 MG EC tablet Take 1 tablet (75 mg total) 2 (two) times daily by mouth. (Patient not taking: Reported on 04/24/2017) 30 tablet 2  . fluticasone furoate-vilanterol (BREO ELLIPTA) 200-25 MCG/INH AEPB Inhale 1 puff into the lungs daily. (Patient not taking: Reported on 04/24/2017) 2 each 0   No facility-administered medications prior to visit.      Allergies:   Phenergan [promethazine hcl]   Social History   Socioeconomic History  . Marital status: Married    Spouse name: None  . Number of children: None  . Years of education: None  . Highest education level: None  Social Needs  . Financial resource strain: None  . Food insecurity - worry: None  . Food insecurity - inability: None  . Transportation needs - medical: None  . Transportation needs - non-medical: None  Occupational History  . None  Tobacco Use  . Smoking status: Passive Smoke Exposure - Never Smoker  . Smokeless tobacco: Never Used  Substance and Sexual Activity  . Alcohol use: No  . Drug use: No  . Sexual activity: Yes  Other Topics Concern  .  None  Social History Narrative  . None     Family History:  The patient's family history includes Heart Problems in his brother; Hypertension in his father and mother.   ROS:   Please see the history of present illness.    ROS All other systems reviewed and are negative.   PHYSICAL EXAM:   VS:  BP (!) 142/98   Pulse 76   Ht 5' 9"  (1.753 m)   Wt 175 lb (79.4 kg)   BMI 25.84 kg/m    GEN: Well nourished, well developed, in no acute distress  HEENT: normal  Neck: no JVD, carotid bruits, or masses Cardiac: RRR; no  murmurs, rubs, or gallops,no edema  Respiratory:  clear to auscultation bilaterally, normal work of breathing GI: soft, nontender, nondistended, + BS MS: no deformity or atrophy  Skin: warm and dry, no rash Neuro:  Alert and Oriented x 3, Strength and sensation are intact Psych: euthymic mood, full affect  Wt Readings from Last 3 Encounters:  04/29/17 175 lb (79.4 kg)  02/23/17 173 lb (78.5 kg)  12/22/16 169 lb (76.7 kg)      Studies/Labs Reviewed:   EKG:  EKG is ordered today.  The ekg ordered today demonstrates NSR with 1st degree AV block  Recent Labs: 08/25/2016: BUN 9; Creatinine, Ser 1.16; Hemoglobin 14.0; Platelets 175; Potassium 3.9; Sodium 140 04/29/2017: ALT 26   Lipid Panel    Component Value Date/Time   CHOL 166 04/29/2017 1202   TRIG 53 04/29/2017 1202   HDL 66 04/29/2017 1202   CHOLHDL 2.5 04/29/2017 1202   CHOLHDL 2.4 03/05/2016 0824   VLDL 12 03/05/2016 0824   LDLCALC 89 04/29/2017 1202    Additional studies/ records that were reviewed today include:   Echo 01/23/2016 LV EF: 65% -   70%  ------------------------------------------------------------------- Indications:      Chest pain 786.51.  ------------------------------------------------------------------- History:   PMH:   Chronic obstructive pulmonary disease.  Risk factors:  Hypertension. Diabetes mellitus.  Dyslipidemia.  ------------------------------------------------------------------- Study Conclusions  - Left ventricle: The cavity size was normal. There was mild   concentric hypertrophy. Systolic function was vigorous. The   estimated ejection fraction was in the range of 65% to 70%. Wall   motion was normal; there were no regional wall motion   abnormalities. Doppler parameters are consistent with abnormal   left ventricular relaxation (grade 1 diastolic dysfunction).   Doppler parameters are consistent with elevated ventricular   end-diastolic filling pressure. - Aortic valve: There was no regurgitation. - Aortic root: The aortic root was normal in size. - Mitral valve: There was trivial regurgitation. - Left atrium: The atrium was normal in size. - Right ventricle: Systolic function was normal. - Right atrium: The atrium was normal in size. - Tricuspid valve: There was trivial regurgitation. - Pulmonic valve: There was no regurgitation. - Pulmonary arteries: Systolic pressure was within the normal   range. - Inferior vena cava: The vessel was normal in size. - Pericardium, extracardiac: There was no pericardial effusion.   Myoview 03/05/2016 Study Highlights    The left ventricular ejection fraction is normal (55-65%).  Nuclear stress EF: 58%.  There was no ST segment deviation noted during stress.  The study is normal.  This is a low risk study.     ASSESSMENT:    1. Essential hypertension   2. Preop cardiovascular exam   3. Hyperlipidemia, unspecified hyperlipidemia type   4. Encounter for long-term (current) use of medications   5. Chronic obstructive pulmonary disease, unspecified COPD type ([place])   6. OSA on CPAP   7. Controlled type 2 diabetes mellitus without complication, without long-term current use of insulin ([facility])      PLAN:  In order of problems listed above:  1. Hypertension: Blood pressure mildly elevated today, however normally blood  pressure is better controlled at home.  We will continue on current medication.  On amlodipine, losartan-HCTZ  2. Hyperlipidemia: Continue pravastatin 20 mg daily.  Lipid panel obtained by primary care provider  3. DM 2: Managed by primary care provider.  4. Obstructive sleep apnea on CPAP: Continue CPAP  5. COPD: Per patient, he has never  smoked himself, but he was exposed to secondhand smoke for extended period.  No sign of acute exacerbation  6. Upcoming left knee surgery: He never had a history of CAD, his stress test in 2017 was normal.  I do not expect any further workup prior to left knee surgery.  It does not appears cardiac clearance was requested, however even if it is, patient would be a low risk for the intended procedure.    Medication Adjustments/Labs and Tests Ordered: Current medicines are reviewed at length with the patient today.  Concerns regarding medicines are outlined above.  Medication changes, Labs and Tests ordered today are listed in the Patient Instructions below. Patient Instructions  Medication Instructions:   No changes  Labwork:   Fasting cholesterol and liver function tests today  Testing/Procedures:  none  Follow-Up:  With Dr. Debara Pickett in 1 year  You are cleared from a cardiovascular standpoint for your upcoming orthopedic procedure.  If you need a refill on your cardiac medications before your next appointment, please call your pharmacy.     Hilbert Corrigan, [place]  05/01/2017 1:04 PM    [facility] [address], [place], [place]  [address] Phone: [phone]; Fax: [phone]

## 2017-04-30 LAB — LIPID PANEL
Chol/HDL Ratio: 2.5 ratio (ref 0.0–5.0)
Cholesterol, Total: 166 mg/dL (ref 100–199)
HDL: 66 mg/dL (ref >39)
LDL CALC: 89 mg/dL (ref 0–99)
Triglycerides: 53 mg/dL (ref 0–149)
VLDL CHOLESTEROL CAL: 11 mg/dL (ref 5–40)

## 2017-04-30 LAB — HEPATIC FUNCTION PANEL
ALBUMIN: 3.9 g/dL (ref 3.5–5.5)
ALK PHOS: 37 IU/L — AB (ref 39–117)
ALT: 26 IU/L (ref 0–44)
AST: 24 IU/L (ref 0–40)
Bilirubin Total: 0.7 mg/dL (ref 0.0–1.2)
Bilirubin, Direct: 0.18 mg/dL (ref 0.00–0.40)
TOTAL PROTEIN: 6 g/dL (ref 6.0–8.5)

## 2017-05-01 ENCOUNTER — Encounter: Payer: Self-pay | Admitting: Physician Assistant

## 2017-05-01 NOTE — Progress Notes (Signed)
Cholesterol very well controlled. Liver function stable.

## 2017-05-05 ENCOUNTER — Encounter (HOSPITAL_COMMUNITY): Payer: Self-pay | Admitting: *Deleted

## 2017-05-05 ENCOUNTER — Other Ambulatory Visit: Payer: Self-pay

## 2017-05-05 ENCOUNTER — Encounter (HOSPITAL_COMMUNITY)
Admission: RE | Admit: 2017-05-05 | Discharge: 2017-05-05 | Disposition: A | Discharge status: Home / Self Care | Payer: Medicare Other | Source: Ambulatory Visit | Attending: Orthopaedic Surgery | Admitting: Orthopaedic Surgery

## 2017-05-05 DIAGNOSIS — J449 Chronic obstructive pulmonary disease, unspecified: Secondary | ICD-10-CM | POA: Diagnosis not present

## 2017-05-05 DIAGNOSIS — E1140 Type 2 diabetes mellitus with diabetic neuropathy, unspecified: Secondary | ICD-10-CM | POA: Insufficient documentation

## 2017-05-05 DIAGNOSIS — G4733 Obstructive sleep apnea (adult) (pediatric): Secondary | ICD-10-CM | POA: Insufficient documentation

## 2017-05-05 DIAGNOSIS — Z79899 Other long term (current) drug therapy: Secondary | ICD-10-CM | POA: Insufficient documentation

## 2017-05-05 DIAGNOSIS — Z01818 Encounter for other preprocedural examination: Secondary | ICD-10-CM | POA: Diagnosis not present

## 2017-05-05 DIAGNOSIS — E785 Hyperlipidemia, unspecified: Secondary | ICD-10-CM | POA: Insufficient documentation

## 2017-05-05 DIAGNOSIS — Z7952 Long term (current) use of systemic steroids: Secondary | ICD-10-CM | POA: Diagnosis not present

## 2017-05-05 DIAGNOSIS — M1712 Unilateral primary osteoarthritis, left knee: Secondary | ICD-10-CM | POA: Insufficient documentation

## 2017-05-05 DIAGNOSIS — I10 Essential (primary) hypertension: Secondary | ICD-10-CM | POA: Insufficient documentation

## 2017-05-05 DIAGNOSIS — Z7984 Long term (current) use of oral hypoglycemic drugs: Secondary | ICD-10-CM | POA: Insufficient documentation

## 2017-05-05 LAB — COMPREHENSIVE METABOLIC PANEL
ALBUMIN: 3.3 g/dL — AB (ref 3.5–5.0)
ALT: 17 U/L (ref 17–63)
ANION GAP: 9 (ref 5–15)
AST: 18 U/L (ref 15–41)
Alkaline Phosphatase: 31 U/L — ABNORMAL LOW (ref 38–126)
BILIRUBIN TOTAL: 1.2 mg/dL (ref 0.3–1.2)
BUN: 15 mg/dL (ref 6–20)
CHLORIDE: 109 mmol/L (ref 101–111)
CO2: 23 mmol/L (ref 22–32)
Calcium: 8.5 mg/dL — ABNORMAL LOW (ref 8.9–10.3)
Creatinine, Ser: 1.36 mg/dL — ABNORMAL HIGH (ref 0.61–1.24)
GFR calc Af Amer: >60 mL/min (ref >60)
GFR calc non Af Amer: 56 mL/min — ABNORMAL LOW (ref >60)
Glucose, Bld: 93 mg/dL (ref 65–99)
POTASSIUM: 4.1 mmol/L (ref 3.5–5.1)
Sodium: 141 mmol/L (ref 135–145)
Total Protein: 5.9 g/dL — ABNORMAL LOW (ref 6.5–8.1)

## 2017-05-05 LAB — TYPE AND SCREEN
ABO/RH(D): O POS
Antibody Screen: NEGATIVE

## 2017-05-05 LAB — CBC WITH DIFFERENTIAL/PLATELET
BASOS ABS: 0.1 10*3/uL (ref 0.0–0.1)
Basophils Relative: 1 %
EOS PCT: 8 %
Eosinophils Absolute: 0.4 10*3/uL (ref 0.0–0.7)
HEMATOCRIT: 48.9 % (ref 39.0–52.0)
Hemoglobin: 16 g/dL (ref 13.0–17.0)
Lymphocytes Relative: 40 %
Lymphs Abs: 1.9 10*3/uL (ref 0.7–4.0)
MCH: 28.7 pg (ref 26.0–34.0)
MCHC: 32.7 g/dL (ref 30.0–36.0)
MCV: 87.8 fL (ref 78.0–100.0)
MONO ABS: 0.4 10*3/uL (ref 0.1–1.0)
MONOS PCT: 9 %
NEUTROS ABS: 2.1 10*3/uL (ref 1.7–7.7)
Neutrophils Relative %: 42 %
PLATELETS: 229 10*3/uL (ref 150–400)
RBC: 5.57 MIL/uL (ref 4.22–5.81)
RDW: 14.2 % (ref 11.5–15.5)
WBC: 4.8 10*3/uL (ref 4.0–10.5)

## 2017-05-05 LAB — PROTIME-INR
INR: 0.92
PROTHROMBIN TIME: 12.2 s (ref 11.4–15.2)

## 2017-05-05 LAB — SURGICAL PCR SCREEN
MRSA, PCR: NEGATIVE
STAPHYLOCOCCUS AUREUS: NEGATIVE

## 2017-05-05 LAB — ABO/RH: ABO/RH(D): O POS

## 2017-05-05 LAB — GLUCOSE, CAPILLARY: Glucose-Capillary: 97 mg/dL (ref 65–99)

## 2017-05-05 LAB — HEMOGLOBIN A1C
HEMOGLOBIN A1C: 5.7 % — AB (ref 4.8–5.6)
MEAN PLASMA GLUCOSE: 116.89 mg/dL

## 2017-05-05 LAB — APTT: APTT: 28 s (ref 24–36)

## 2017-05-05 MED ORDER — TRANEXAMIC ACID 1000 MG/10ML IV SOLN
1000.0000 mg | INTRAVENOUS | Status: AC
  Filled 2017-05-05: qty 10

## 2017-05-05 NOTE — Pre-Procedure Instructions (Signed)
Christian [name]  05/05/2017     Your procedure is scheduled on Wednesday, January 9.  Report to [facility] Admitting at 1:15 PM                   Your surgery or procedure is scheduled for 3:15 PM    Call this number if you have problems the morning of surgery: [phone]- pre- op desk   Remember:  Do not eat food or drink liquids after midnight:  Take these medicines the morning of surgery with A SIP OF WATER: amLODipine (NORVASC) gabapentin (NEURONTIN)  fluticasone (FLONASE)  DO NOT take Medication for Diabetes the morning of surgery.  May use Nose spray if needed  Take if needed:  cyclobenzaprine (FLEXERIL) Use if needed Nebulizer or inhaler.  Bring inhaler to the hospital with you.  Stop Aspirin if directed to by Dr Erlinda Hong.   How to Manage Your Diabetes Before and After Surgery  Why is it important to control my blood sugar before and after surgery? . Improving blood sugar levels before and after surgery helps healing and can limit problems. . A way of improving blood sugar control is eating a healthy diet by: o  Eating less sugar and carbohydrates o  Increasing activity/exercise o  Talking with your doctor about reaching your blood sugar goals . High blood sugars (greater than 180 mg/dL) can raise your risk of infections and slow your recovery, so you will need to focus on controlling your diabetes during the weeks before surgery. . Make sure that the doctor who takes care of your diabetes knows about your planned surgery including the date and location.  How do I manage my blood sugar before surgery? . Check your blood sugar at least 4 times a day, starting 2 days before surgery, to make sure that the level is not too high or low. o Check your blood sugar the morning of your surgery when you wake up and every 2 hours until you get to the Short Stay unit. . If your blood sugar is less than 70 mg/dL, you will need to treat for low blood sugar: o Do not  take insulin. o Treat a low blood sugar (less than 70 mg/dL) with  cup of clear juice (cranberry or apple), 4 glucose tablets, OR glucose gel. Recheck blood sugar in 15 minutes after treatment (to make sure it is greater than 70 mg/dL). If your blood sugar is not greater than 70 mg/dL on recheck, call [phone] o  for further instructions. . Report your blood sugar to the short stay nurse when you get to Short Stay.  . If you are admitted to the hospital after surgery: o Your blood sugar will be checked by the staff and you will probably be given insulin after surgery (instead of oral diabetes medicines) to make sure you have good blood sugar levels. o The goal for blood sugar control after surgery is 80-180 mg/d    WHAT DO I DO ABOUT MY DIABETES MEDICATION? Marland Kitchen Do not take oral diabetes medicines (pills) the morning of surgery.  Special instructions:  [place]- Preparing For Surgery  Before surgery, you can play an important role. Because skin is not sterile, your skin needs to be as free of germs as possible. You can reduce the number of germs on your skin by washing with CHG (chlorahexidine gluconate) Soap before surgery.  CHG is an antiseptic cleaner which kills germs and bonds with the skin  to continue killing germs even after washing.  Please do not use if you have an allergy to CHG or antibacterial soaps. If your skin becomes reddened/irritated stop using the CHG.  Do not shave (including legs and underarms) for at least 48 hours prior to first CHG shower. It is OK to shave your face.  Please follow these instructions carefully.   1. Shower the NIGHT BEFORE SURGERY and the MORNING OF SURGERY with CHG.   2. If you chose to wash your hair, wash your hair first as usual with your normal shampoo.  3. After you shampoo, rinse your hair and body thoroughly to remove the shampoo.  4. Use CHG as you would any other liquid soap. You can apply CHG directly to the skin and wash gently  with a scrungie or a clean washcloth.   5. Apply the CHG Soap to your body ONLY FROM THE NECK DOWN.  Do not use on open wounds or open sores. Avoid contact with your eyes, ears, mouth and genitals (private parts). Wash Face and genitals (private parts)  with your normal soap.  6. Wash thoroughly, paying special attention to the area where your surgery will be performed.  7. Thoroughly rinse your body with warm water from the neck down.  8. DO NOT shower/wash with your normal soap after using and rinsing off the CHG Soap.  9. Pat yourself dry with a CLEAN TOWEL.  10. Wear CLEAN PAJAMAS to bed the night before surgery, wear comfortable clothes the morning of surgery  11. Place CLEAN SHEETS on your bed the night of your first shower and DO NOT SLEEP WITH PETS.  Day of Surgery: Shower as above. Do not apply any deodorants/lotions, powders or colognes.. Please wear clean clothes to the hospital/surgery center.    Do not wear jewelry, make-up or nail polish.  Do not shave 48 hours prior to surgery.  Men may shave face and neck.  Do not bring valuables to the hospital.  [facility] is not responsible for any belongings or valuables.  Contacts, dentures or bridgework may not be worn into surgery.  Leave your suitcase in the car.  After surgery it may be brought to your room.  For patients admitted to the hospital, discharge time will be determined by your treatment team.  Please read over the following fact sheets that you were given: Pain Booklet, Patient Instructions for Mupirocin Application, Incentive Spirometry, Surgical Site Infections.

## 2017-05-06 ENCOUNTER — Telehealth (INDEPENDENT_AMBULATORY_CARE_PROVIDER_SITE_OTHER): Payer: Self-pay | Admitting: Orthopaedic Surgery

## 2017-05-06 ENCOUNTER — Ambulatory Visit (HOSPITAL_COMMUNITY): Admission: RE | Admit: 2017-05-06 | Payer: Medicare Other | Source: Ambulatory Visit | Admitting: Orthopaedic Surgery

## 2017-05-06 HISTORY — DX: Dyspnea, unspecified: R06.00

## 2017-05-06 SURGERY — ARTHROPLASTY, KNEE, TOTAL
Anesthesia: Spinal | Site: Knee | Laterality: Left

## 2017-05-06 NOTE — Telephone Encounter (Signed)
Pt called and left VM to cancel surgery. I spoke with pt he would like to resched around feb

## 2017-05-06 NOTE — Telephone Encounter (Signed)
Christian [name], message already sent to [facility].

## 2017-05-06 NOTE — Telephone Encounter (Signed)
See note below, FYI

## 2017-05-10 ENCOUNTER — Emergency Department (HOSPITAL_COMMUNITY): Payer: Medicare Other

## 2017-05-10 ENCOUNTER — Emergency Department (HOSPITAL_COMMUNITY)
Admission: EM | Admit: 2017-05-10 | Discharge: 2017-05-10 | Disposition: A | Discharge status: Home / Self Care | Payer: Medicare Other | Attending: Emergency Medicine | Admitting: Emergency Medicine

## 2017-05-10 ENCOUNTER — Other Ambulatory Visit: Payer: Self-pay

## 2017-05-10 ENCOUNTER — Encounter (HOSPITAL_COMMUNITY): Payer: Self-pay

## 2017-05-10 DIAGNOSIS — R079 Chest pain, unspecified: Secondary | ICD-10-CM | POA: Diagnosis not present

## 2017-05-10 DIAGNOSIS — J4540 Moderate persistent asthma, uncomplicated: Secondary | ICD-10-CM | POA: Insufficient documentation

## 2017-05-10 DIAGNOSIS — R0789 Other chest pain: Secondary | ICD-10-CM

## 2017-05-10 DIAGNOSIS — E1140 Type 2 diabetes mellitus with diabetic neuropathy, unspecified: Secondary | ICD-10-CM | POA: Insufficient documentation

## 2017-05-10 DIAGNOSIS — Z7722 Contact with and (suspected) exposure to environmental tobacco smoke (acute) (chronic): Secondary | ICD-10-CM | POA: Insufficient documentation

## 2017-05-10 DIAGNOSIS — Z7984 Long term (current) use of oral hypoglycemic drugs: Secondary | ICD-10-CM | POA: Diagnosis not present

## 2017-05-10 DIAGNOSIS — R071 Chest pain on breathing: Secondary | ICD-10-CM | POA: Diagnosis not present

## 2017-05-10 DIAGNOSIS — Z7982 Long term (current) use of aspirin: Secondary | ICD-10-CM | POA: Diagnosis not present

## 2017-05-10 DIAGNOSIS — I10 Essential (primary) hypertension: Secondary | ICD-10-CM | POA: Diagnosis not present

## 2017-05-10 DIAGNOSIS — Z79899 Other long term (current) drug therapy: Secondary | ICD-10-CM | POA: Insufficient documentation

## 2017-05-10 DIAGNOSIS — J441 Chronic obstructive pulmonary disease with (acute) exacerbation: Secondary | ICD-10-CM

## 2017-05-10 LAB — CBC WITH DIFFERENTIAL/PLATELET
BASOS PCT: 1 %
Basophils Absolute: 0.1 10*3/uL (ref 0.0–0.1)
EOS ABS: 0.6 10*3/uL (ref 0.0–0.7)
Eosinophils Relative: 8 %
HEMATOCRIT: 45.9 % (ref 39.0–52.0)
HEMOGLOBIN: 15.8 g/dL (ref 13.0–17.0)
Lymphocytes Relative: 20 %
Lymphs Abs: 1.6 10*3/uL (ref 0.7–4.0)
MCH: 29.9 pg (ref 26.0–34.0)
MCHC: 34.4 g/dL (ref 30.0–36.0)
MCV: 86.8 fL (ref 78.0–100.0)
MONOS PCT: 11 %
Monocytes Absolute: 0.8 10*3/uL (ref 0.1–1.0)
NEUTROS ABS: 4.8 10*3/uL (ref 1.7–7.7)
NEUTROS PCT: 60 %
Platelets: 236 10*3/uL (ref 150–400)
RBC: 5.29 MIL/uL (ref 4.22–5.81)
RDW: 14.1 % (ref 11.5–15.5)
WBC: 7.9 10*3/uL (ref 4.0–10.5)

## 2017-05-10 LAB — COMPREHENSIVE METABOLIC PANEL
ALK PHOS: 37 U/L — AB (ref 38–126)
ALT: 17 U/L (ref 17–63)
ANION GAP: 10 (ref 5–15)
AST: 27 U/L (ref 15–41)
Albumin: 3.3 g/dL — ABNORMAL LOW (ref 3.5–5.0)
BILIRUBIN TOTAL: 1.2 mg/dL (ref 0.3–1.2)
BUN: 8 mg/dL (ref 6–20)
CALCIUM: 8.5 mg/dL — AB (ref 8.9–10.3)
CO2: 26 mmol/L (ref 22–32)
Chloride: 104 mmol/L (ref 101–111)
Creatinine, Ser: 1.15 mg/dL (ref 0.61–1.24)
GFR calc Af Amer: >60 mL/min (ref >60)
GFR calc non Af Amer: >60 mL/min (ref >60)
GLUCOSE: 127 mg/dL — AB (ref 65–99)
POTASSIUM: 3.7 mmol/L (ref 3.5–5.1)
SODIUM: 140 mmol/L (ref 135–145)
Total Protein: 5.4 g/dL — ABNORMAL LOW (ref 6.5–8.1)

## 2017-05-10 LAB — D-DIMER, QUANTITATIVE: D-Dimer, Quant: <0.27 ug/mL-FEU (ref 0.00–0.50)

## 2017-05-10 LAB — TROPONIN I: Troponin I: <0.03 ng/mL (ref <0.03)

## 2017-05-10 LAB — I-STAT TROPONIN, ED: TROPONIN I, POC: 0 ng/mL (ref 0.00–0.08)

## 2017-05-10 MED ORDER — AEROCHAMBER PLUS W/MASK MISC
1.0000 | Freq: Once | Status: AC
  Administered 2017-05-10: 1

## 2017-05-10 MED ORDER — ACETAMINOPHEN 500 MG PO TABS
1000.0000 mg | ORAL_TABLET | Freq: Once | ORAL | Status: AC
  Administered 2017-05-10: 1000 mg via ORAL
  Filled 2017-05-10: qty 2

## 2017-05-10 MED ORDER — DOXYCYCLINE HYCLATE 100 MG PO CAPS: 100.0000 mg | ORAL_CAPSULE | Freq: Two times a day (BID) | ORAL | 0 refills | Status: DC

## 2017-05-10 MED ORDER — MAGNESIUM SULFATE 2 GM/50ML IV SOLN
2.0000 g | Freq: Once | INTRAVENOUS | Status: AC
  Administered 2017-05-10: 2 g via INTRAVENOUS
  Filled 2017-05-10: qty 50

## 2017-05-10 MED ORDER — IPRATROPIUM-ALBUTEROL 0.5-2.5 (3) MG/3ML IN SOLN
3.0000 mL | Freq: Once | RESPIRATORY_TRACT | Status: AC
Start: 2017-05-10 — End: 2017-05-10
  Administered 2017-05-10: 3 mL via RESPIRATORY_TRACT
  Filled 2017-05-10: qty 3

## 2017-05-10 MED ORDER — ALBUTEROL SULFATE (2.5 MG/3ML) 0.083% IN NEBU
5.0000 mg | INHALATION_SOLUTION | Freq: Once | RESPIRATORY_TRACT | Status: AC
  Administered 2017-05-10: 5 mg via RESPIRATORY_TRACT
  Filled 2017-05-10: qty 6

## 2017-05-10 MED ORDER — PREDNISONE 50 MG PO TABS: ORAL_TABLET | ORAL | 0 refills | Status: DC

## 2017-05-10 MED ORDER — DOXYCYCLINE HYCLATE 100 MG PO TABS
100.0000 mg | ORAL_TABLET | Freq: Once | ORAL | Status: AC
  Administered 2017-05-10: 100 mg via ORAL
  Filled 2017-05-10: qty 1

## 2017-05-10 NOTE — ED Provider Notes (Signed)
7:30 AM patient presently complains of left-sided parasternal chest pain worse with changing positions or deep inspiration onset yesterday accompanied by cough and shortness of breath.  No fever.  His breathing is not quite at baseline yet.  On exam he speaks in paragraphs.  No respiratory distress.  Lungs with minimal end expiratory wheezes.  Heart regular rate and rhythm.  Chest is tender anteriorly.  Pain is exacerbated when he sits up from a supine position. Chest x-ray viewed by me   9:05 AM patient states she is breathing at baseline.  His pulse oximetry dropped to 90% while ambulating however at rest at 9:05 AM 96% on room air.  Lungs clear to auscultation.he reports he feels ready to go home.  He speaks in paragraphs.   no respiratory distress. Plan Dr. Kingsley Callander has written prescriptions for prednisone and doxycycline.  He will get 1 dose of doxycycline prior to discharge.  He has home albuterol HFA and home nebulizer.  He will be given a spacer to go. Instructed to use albuterol every 4 hours as needed.  Return if needed more than every 4 hours or see his physician at [place] [facility]    [place], Inocente Salles, MD 05/10/17 [phone]

## 2017-05-10 NOTE — ED Provider Notes (Addendum)
[facility]Y DEPARTMENT Provider Note   CSN: [id number] Arrival date & time: 05/10/17  0457     History   Chief Complaint Chief Complaint  Patient presents with  . Chest Pain  . Shortness of Breath    HPI Christian [name] is a 57 y.o. male.  Patient presents via EMS with shortness of breath and chest pain that has been worsening since last evening.  Patient with history of COPD though he does not smoke himself, hypertension, hyperlipidemia, sleep apnea.  Using his inhalers at home without relief.  Complains of soreness across his anterior chest that is worse with coughing and deep breathing.  Cough is productive of yellow and green mucus.  No fever.  Endorses runny nose and sore throat as well.  Called EMS tonight because he was not able to sleep due to shortness of breath and chest pain.  Pain is in the center of his chest and radiates to the right side.  Complains of generalized weakness and nausea but no vomiting.  No leg pain or leg swelling.  Received nebulizer and steroids by EMS and is starting to feel better.   The history is provided by the patient and the EMS personnel.  Chest Pain   Associated symptoms include cough, shortness of breath and weakness. Pertinent negatives include no abdominal pain, no dizziness, no fever, no headaches, no nausea and no vomiting.  Shortness of Breath  Associated symptoms include rhinorrhea, cough and chest pain. Pertinent negatives include no fever, no headaches, no vomiting, no abdominal pain and no rash.    Past Medical History:  Diagnosis Date  . Arthritis    "left knee" (01/22/2016)  . Asthma   . Chronic lower back pain   . COPD (chronic obstructive pulmonary disease) ([place])   . Dyspnea    with extertion  . High cholesterol   . Hypertension   . Neuropathy   . OSA on CPAP   . Secondhand smoke exposure   . Type II diabetes mellitus ([place]) dx'd 01/17/2016    Patient Active Problem List   Diagnosis Date  Noted  . Moderate persistent asthma without complication 23/30/0762  . Primary osteoarthritis of left knee 02/23/2017  . Chronic pain syndrome 04/02/2016  . Intrinsic asthma 03/25/2016  . Essential hypertension 01/23/2016  . Hyperlipidemia 01/23/2016  . Secondhand smoke exposure 01/23/2016  . Diabetes mellitus with neuropathy ([place]) 01/23/2016  . Musculoskeletal chest pain 01/22/2016    Past Surgical History:  Procedure Laterality Date  . ANTERIOR CERVICAL DECOMP/DISCECTOMY FUSION  ~ 1995   "titanium"  . APPENDECTOMY  ~ 1977  . BACK SURGERY     x 2  L 4disectomy  . INGUINAL HERNIA REPAIR Right ~ 2000  . LUMBAR DISC SURGERY  ~ 2000; ~2006  . NASAL SINUS SURGERY  ~ 2007       Home Medications    Prior to Admission medications   Medication Sig Start Date End Date Taking? Authorizing Provider  acetaminophen (TYLENOL) 500 MG tablet Take 500 mg by mouth every 6 (six) hours as needed.    [provider]  albuterol (PROAIR HFA) 108 (90 Base) MCG/ACT inhaler Inhale 1-2 puffs into the lungs every 6 (six) hours as needed for wheezing or shortness of breath. 03/31/17   Ladell Pier, MD  albuterol (PROVENTIL) (5 MG/ML) 0.5% nebulizer solution Take 2.5 mg by nebulization every 6 (six) hours as needed for wheezing or shortness of breath.    [provider]  amLODipine (NORVASC) 10 MG tablet Take 1 tablet (10 mg total) by mouth daily. 09/15/16   Maren Reamer, MD  aspirin EC 81 MG tablet Take 1 tablet (81 mg total) by mouth daily. 09/15/16   Maren Reamer, MD  Blood Glucose Monitoring Suppl (ONE TOUCH ULTRA 2) w/Device KIT Use as directed 3 times daily E11.9 03/31/17   Ladell Pier, MD  cyclobenzaprine (FLEXERIL) 10 MG tablet Take 1 tablet (10 mg total) by mouth 3 (three) times daily as needed for muscle spasms. 09/15/16   Maren Reamer, MD  diclofenac sodium (VOLTAREN) 1 % GEL Apply 2 g 4 (four) times daily topically. Patient taking differently: Apply 2 g  topically 4 (four) times daily as needed (FOR PAIN.).  03/03/17   Leandrew Koyanagi, MD  fluticasone (FLONASE) 50 MCG/ACT nasal spray Place 2 sprays into both nostrils daily. 09/15/16   Maren Reamer, MD  gabapentin (NEURONTIN) 300 MG capsule Take 1 capsule (300 mg total) by mouth 4 (four) times daily. Patient taking differently: Take 300 mg by mouth 3 (three) times daily.  03/31/17   Ladell Pier, MD  glucose blood (TRUE METRIX BLOOD GLUCOSE TEST) test strip Use as instructed 02/01/16   Langeland, Dawn T, MD  ipratropium-albuterol (DUONEB) 0.5-2.5 (3) MG/3ML SOLN Take 3 mLs by nebulization every 4 (four) hours as needed. 05/28/16   Langeland, Leda Quail, MD  losartan-hydrochlorothiazide (HYZAAR) 50-12.5 MG tablet Take 1 tablet by mouth daily. Patient taking differently: Take 1 tablet by mouth every evening.  09/15/16   Lottie Mussel T, MD  meloxicam (MOBIC) 15 MG tablet Take 1 tablet (15 mg total) by mouth daily. Take with food 12/22/16   Ladell Pier, MD  metFORMIN (GLUCOPHAGE XR) 500 MG 24 hr tablet Take 1 tablet (500 mg total) by mouth daily with breakfast. 09/15/16   Langeland, Dawn T, MD  montelukast (SINGULAIR) 10 MG tablet Take 1 tablet (10 mg total) by mouth at bedtime. 02/23/17   Ladell Pier, MD  pravastatin (PRAVACHOL) 20 MG tablet Take 1 tablet (20 mg total) by mouth daily. Patient taking differently: Take 20 mg by mouth at bedtime.  09/15/16   Maren Reamer, MD  sodium chloride (OCEAN) 0.65 % SOLN nasal spray Place 1 spray into both nostrils as needed for congestion. 07/16/16   Maren Reamer, MD  TRUEPLUS LANCETS 28G MISC Check glucose 2 times a day 02/01/16   Maren Reamer, MD    Family History Family History  Problem Relation Age of Onset  . Hypertension Mother   . Hypertension Father   . Heart Problems Brother        Heart transplant, pt does not know cause     Social History Social History   Tobacco Use  . Smoking status: Passive Smoke Exposure - Never  Smoker  . Smokeless tobacco: Never Used  . Tobacco comment: passive cig smoking at work  1994- 2012  Substance Use Topics  . Alcohol use: No  . Drug use: No     Allergies   Phenergan [promethazine hcl]   Review of Systems Review of Systems  Constitutional: Negative for activity change, appetite change and fever.  HENT: Positive for congestion and rhinorrhea.   Respiratory: Positive for cough, chest tightness and shortness of breath.   Cardiovascular: Positive for chest pain.  Gastrointestinal: Negative for abdominal pain, nausea and vomiting.  Genitourinary: Negative for dysuria, hematuria and testicular pain.  Musculoskeletal: Positive for arthralgias and myalgias.  Skin: Negative for rash.  Neurological: Positive for weakness. Negative for dizziness and headaches.    all other systems are negative except as noted in the HPI and PMH.    Physical Exam Updated Vital Signs BP (!) 156/91 (BP Location: Right Arm)   Pulse 92   Temp 98.7 F (37.1 C) (Oral)   Resp 18   SpO2 95%   Physical Exam  Constitutional: He is oriented to person, place, and time. He appears well-developed and well-nourished. No distress.  Flat affect  HENT:  Head: Normocephalic and atraumatic.  Mouth/Throat: Oropharynx is clear and moist. No oropharyngeal exudate.  Eyes: Conjunctivae and EOM are normal. Pupils are equal, round, and reactive to light.  Neck: Normal range of motion. Neck supple.  No meningismus.  Cardiovascular: Normal rate, regular rhythm, normal heart sounds and intact distal pulses.  No murmur heard. Pulmonary/Chest: Effort normal. No respiratory distress. He has wheezes. He exhibits tenderness.  Diminished breath sounds with scattered expiratory wheezing  Abdominal: Soft. There is no tenderness. There is no rebound and no guarding.  Musculoskeletal: Normal range of motion. He exhibits no edema or tenderness.  Neurological: He is alert and oriented to person, place, and time. No  cranial nerve deficit. He exhibits normal muscle tone. Coordination normal.  No ataxia on finger to nose bilaterally. No pronator drift. 5/5 strength throughout. CN 2-12 intact.Equal grip strength. Sensation intact.   Skin: Skin is warm. Capillary refill takes less than 2 seconds.  Psychiatric: He has a normal mood and affect. His behavior is normal.  Nursing note and vitals reviewed.    ED Treatments / Results  Labs (all labs ordered are listed, but only abnormal results are displayed) Labs Reviewed  COMPREHENSIVE METABOLIC PANEL - Abnormal; Notable for the following components:      Result Value   Glucose, Bld 127 (*)    Calcium 8.5 (*)    Total Protein 5.4 (*)    Albumin 3.3 (*)    Alkaline Phosphatase 37 (*)    All other components within normal limits  CBC WITH DIFFERENTIAL/PLATELET  TROPONIN I  D-DIMER, QUANTITATIVE (NOT AT [facility])  TROPONIN I    EKG  EKG Interpretation  Date/Time:  Sunday May 10 2017 05:04:55 EST Ventricular Rate:  94 PR Interval:    QRS Duration: 155 QT Interval:  360 QTC Calculation: 451 R Axis:   65 Text Interpretation:  Sinus rhythm Nonspecific intraventricular conduction delay Probable anteroseptal infarct, old No significant change was found Confirmed by Ezequiel Essex [phone]) on 05/10/2017 5:08:44 AM       Radiology Dg Chest 2 View  Result Date: 05/10/2017 CLINICAL DATA:  57 year old male with chest pain. EXAM: CHEST  2 VIEW COMPARISON:  Chest radiograph dated 08/25/2016 FINDINGS: The heart size and mediastinal contours are within normal limits. Both lungs are clear. The visualized skeletal structures are unremarkable. IMPRESSION: No active cardiopulmonary disease. Electronically Signed   By: Anner Crete M.D.   On: 05/10/2017 06:19    Procedures Procedures (including critical care time)  Medications Ordered in ED Medications  ipratropium-albuterol (DUONEB) 0.5-2.5 (3) MG/3ML nebulizer solution 3 mL (not administered)    magnesium sulfate IVPB 2 g 50 mL (not administered)     Initial Impression / Assessment and Plan / ED Course  I have reviewed the triage vital signs and the nursing notes.  Pertinent labs & imaging results that were available during my care of the patient were reviewed by me and considered in my medical decision making (  see chart for details).    Patient from home with shortness of breath and chest pain since last night.  Describes chest pain as soreness across chest that is worse with palpation and coughing.  On arrival patient is in no distress.  Faint expiratory wheezing after receiving nebs and steroids from EMS.  Given additional nebulizers and magnesium on arrival.  EKG is nonischemic  Chest x-ray is negative for infiltrates.  On recheck, patient is moving air well and not hypoxic.  Troponin negative.  D-dimer negative.  Patient with negative stress test November 2017.  Low suspicion for ACS.  Second troponin will be checked and patient will be ambulate with pulse oximeter.  Anticipate discharge home with treatment for COPD exacerbation.  Final Clinical Impressions(s) / ED Diagnoses   Final diagnoses:  COPD exacerbation ([place])  Atypical chest pain    ED Discharge Orders    None       Klaus Casteneda, Annie Main, MD 05/10/17 0730    Ezequiel Essex, MD 05/10/17 [phone]

## 2017-05-10 NOTE — ED Notes (Signed)
Pt ambulated around dept-- pulse ox dropped from 95% on room air to 90% on room air--  Wheezes/rhonchi audible in all lung fields when pt returned to room. resp = 24/m.

## 2017-05-10 NOTE — Discharge Instructions (Addendum)
There is no evidence of heart attack or blood clot in the lung.  Take the antibiotics and steroids as prescribed.  Avoid smoke exposure.  Follow-up with your doctor.  Use your albuterol inhaler with spacer or albuterol nebulizer every 4 hours as needed for shortness of breath.  Return if needed more than every 4 hours or see your doctor at the [facility].

## 2017-05-10 NOTE — ED Notes (Signed)
ED Provider at bedside. 

## 2017-05-10 NOTE — ED Triage Notes (Signed)
Pt here via [facility] with c/o chest pain that radiates to right rib area and sob x 4 hours tonight. C/o a little nausea with pain. Pt states that he just feels weak all over. 1 duo-neb and 125mg  Solumedrol given by EMS. Rates pain 10/10 now.

## 2017-05-11 ENCOUNTER — Ambulatory Visit: Payer: Medicare Other | Admitting: Internal Medicine

## 2017-05-18 ENCOUNTER — Telehealth: Payer: Self-pay | Admitting: Internal Medicine

## 2017-05-18 NOTE — Telephone Encounter (Signed)
Contacted pt to inform him that he will have to bring the meter in for me to see. Pt didn't answer lvm informing pt of this information and if he has any questions or concerns to give me a call

## 2017-05-18 NOTE — Telephone Encounter (Signed)
Pt. Called requesting to speak with his nurse regarding a meter he has received. Pt. States he cannot get the meter to work. Please f/u with pt.

## 2017-05-21 ENCOUNTER — Telehealth: Payer: Self-pay | Admitting: Internal Medicine

## 2017-05-21 NOTE — Telephone Encounter (Signed)
Patient called and stated he would like a call back asap regarding his DM

## 2017-05-22 NOTE — Telephone Encounter (Signed)
Returned pt call Pt states he was sent a new meter and pt contacted pharmacy and was resent a new meter. Pt states he looked at the papers that came with meter and it still doesn't work. Pt states this morning his sugar 89. Pt states he tries to keep his sugar under three digits. Pt states he doesn't have any strips to check his sugar.   Pt is needing test strips for his old meter to test his sugar. Pt old meter is prodigy autocode meter. Pt states the strips is prodigy autocode test strips. Pt would like them sent to [place] [phone]

## 2017-05-22 NOTE — Telephone Encounter (Signed)
Pt called to speak with the nurse or pcp, still been waiting to be call, please follow up

## 2017-05-23 MED ORDER — BLOOD GLUCOSE MONITORING SUPPL SUPPLIES MISC: 12 refills | Status: DC

## 2017-05-25 ENCOUNTER — Other Ambulatory Visit: Payer: Self-pay

## 2017-05-25 ENCOUNTER — Telehealth: Payer: Self-pay | Admitting: Internal Medicine

## 2017-05-25 NOTE — Telephone Encounter (Signed)
Pt called to request a call back in regards to his sugar meter being broken and wants another one, but he wants one like the old one he had called [employer]IG[employer] please follow up

## 2017-05-25 NOTE — Telephone Encounter (Signed)
Patient called asking test strips prodigy. Please call patient back.

## 2017-05-26 ENCOUNTER — Ambulatory Visit: Payer: Medicare Other | Admitting: Internal Medicine

## 2017-05-27 ENCOUNTER — Other Ambulatory Visit: Payer: Self-pay

## 2017-05-27 MED ORDER — BLOOD GLUCOSE MONITORING SUPPL SUPPLIES MISC: 12 refills | Status: DC

## 2017-05-27 NOTE — Telephone Encounter (Signed)
Contacted pt to double check about which pharmacy to send test strips pt [facility]. [facility]

## 2017-06-01 DIAGNOSIS — J449 Chronic obstructive pulmonary disease, unspecified: Secondary | ICD-10-CM | POA: Diagnosis not present

## 2017-06-01 DIAGNOSIS — I10 Essential (primary) hypertension: Secondary | ICD-10-CM | POA: Diagnosis not present

## 2017-06-01 DIAGNOSIS — J45909 Unspecified asthma, uncomplicated: Secondary | ICD-10-CM | POA: Diagnosis not present

## 2017-06-01 DIAGNOSIS — G4733 Obstructive sleep apnea (adult) (pediatric): Secondary | ICD-10-CM | POA: Diagnosis not present

## 2017-06-01 DIAGNOSIS — M1990 Unspecified osteoarthritis, unspecified site: Secondary | ICD-10-CM | POA: Diagnosis not present

## 2017-06-08 ENCOUNTER — Ambulatory Visit: Payer: Medicare Other | Attending: Internal Medicine | Admitting: Internal Medicine

## 2017-06-08 ENCOUNTER — Encounter: Payer: Self-pay | Admitting: Internal Medicine

## 2017-06-08 ENCOUNTER — Ambulatory Visit: Payer: Medicare Other | Admitting: Internal Medicine

## 2017-06-08 VITALS — BP 162/98 | HR 72 | Temp 97.7°F | Resp 18 | Ht 70.0 in | Wt 176.0 lb

## 2017-06-08 DIAGNOSIS — M1712 Unilateral primary osteoarthritis, left knee: Secondary | ICD-10-CM | POA: Diagnosis not present

## 2017-06-08 DIAGNOSIS — E1140 Type 2 diabetes mellitus with diabetic neuropathy, unspecified: Secondary | ICD-10-CM | POA: Diagnosis not present

## 2017-06-08 DIAGNOSIS — E785 Hyperlipidemia, unspecified: Secondary | ICD-10-CM | POA: Diagnosis not present

## 2017-06-08 DIAGNOSIS — J069 Acute upper respiratory infection, unspecified: Secondary | ICD-10-CM | POA: Diagnosis not present

## 2017-06-08 DIAGNOSIS — G4733 Obstructive sleep apnea (adult) (pediatric): Secondary | ICD-10-CM | POA: Diagnosis not present

## 2017-06-08 DIAGNOSIS — G894 Chronic pain syndrome: Secondary | ICD-10-CM | POA: Insufficient documentation

## 2017-06-08 DIAGNOSIS — Z79899 Other long term (current) drug therapy: Secondary | ICD-10-CM | POA: Insufficient documentation

## 2017-06-08 DIAGNOSIS — Z7984 Long term (current) use of oral hypoglycemic drugs: Secondary | ICD-10-CM | POA: Diagnosis not present

## 2017-06-08 DIAGNOSIS — Z888 Allergy status to other drugs, medicaments and biological substances status: Secondary | ICD-10-CM | POA: Insufficient documentation

## 2017-06-08 DIAGNOSIS — Z7982 Long term (current) use of aspirin: Secondary | ICD-10-CM | POA: Diagnosis not present

## 2017-06-08 DIAGNOSIS — I10 Essential (primary) hypertension: Secondary | ICD-10-CM | POA: Insufficient documentation

## 2017-06-08 DIAGNOSIS — Z760 Encounter for issue of repeat prescription: Secondary | ICD-10-CM | POA: Insufficient documentation

## 2017-06-08 DIAGNOSIS — J4540 Moderate persistent asthma, uncomplicated: Secondary | ICD-10-CM | POA: Insufficient documentation

## 2017-06-08 DIAGNOSIS — E0840 Diabetes mellitus due to underlying condition with diabetic neuropathy, unspecified: Secondary | ICD-10-CM

## 2017-06-08 DIAGNOSIS — Z1211 Encounter for screening for malignant neoplasm of colon: Secondary | ICD-10-CM

## 2017-06-08 LAB — GLUCOSE, POCT (MANUAL RESULT ENTRY): POC Glucose: 161 mg/dl — AB (ref 70–99)

## 2017-06-08 MED ORDER — BUDESONIDE-FORMOTEROL FUMARATE 80-4.5 MCG/ACT IN AERO: 2.0000 | INHALATION_SPRAY | Freq: Two times a day (BID) | RESPIRATORY_TRACT | 3 refills | Status: DC

## 2017-06-08 MED ORDER — GLUCOSE BLOOD VI STRP: ORAL_STRIP | 12 refills | Status: DC

## 2017-06-08 NOTE — Patient Instructions (Signed)
Start Symbicort inhaler twice a day as discussed.

## 2017-06-08 NOTE — Progress Notes (Signed)
Patient ID: Christian [name], male    DOB: 12-May-1960  MRN: [id number]  CC: Asthma and Medication Refill   Subjective: Christian [name] is a 57 y.o. male who presents for chronic ds managment His concerns today include: 57 year old with history of OSA on CPAP, asthma/copdnever smoked, HL, HTN, diabetes type 2, OA LT knee, chronic pain syndrome (surgery on neck and 2 surgeries lower back; last 2008).  1.  Asthma:  -cold for past 3 wks. More congested at nights both chest and nasal.  Little blood in nasal mucous -inc use of Albuterol. SOB at nights having to use inhaler or neb machine -out of Breo for mths.  Could not afford co-pay -ER 1 mth ago for SOB/CP. D-dimer neg.  CXR neg.  Neg cardiac enzymes.  Given Prednisone and antibiotic.  Never filled rxn for abx due to cost. -given rxn for Singular on last visit.  Never filled it  2.  HTN:  Did not take meds as yet for today  3.  DM:  -got 2 meters that did not work.  Went back to using his old meter.  Would like for me to sent rxn to [facility] for Prodigy stripes Plans to keep his old meter.  Needs stripe for Prodigy Autocode meter -checking BID.  Highest reading was 160.   4.  OA knee:  Saw Dr. Erlinda Hong 02/2017. Plan was for knee replacement surgery.  He cancelled when wife was hospitalized.  Saw cardiology 04/29/2017 for pre-op eval and was deem okay to proceed. Pt wants  to get colonoscopy first for cancer screening and would like referral Patient Active Problem List   Diagnosis Date Noted  . Moderate persistent asthma without complication 72/53/6644  . Primary osteoarthritis of left knee 02/23/2017  . Chronic pain syndrome 04/02/2016  . Intrinsic asthma 03/25/2016  . Essential hypertension 01/23/2016  . Hyperlipidemia 01/23/2016  . Secondhand smoke exposure 01/23/2016  . Diabetes mellitus with neuropathy ([place]) 01/23/2016  . Musculoskeletal chest pain 01/22/2016     Current Outpatient Medications on File Prior to Visit  Medication Sig  Dispense Refill  . albuterol (PROAIR HFA) 108 (90 Base) MCG/ACT inhaler Inhale 1-2 puffs into the lungs every 6 (six) hours as needed for wheezing or shortness of breath. 1 Inhaler 2  . albuterol (PROVENTIL) (5 MG/ML) 0.5% nebulizer solution Take 2.5 mg by nebulization every 6 (six) hours as needed for wheezing or shortness of breath.    Marland Kitchen amLODipine (NORVASC) 10 MG tablet Take 1 tablet (10 mg total) by mouth daily. 90 tablet 3  . aspirin EC 81 MG tablet Take 1 tablet (81 mg total) by mouth daily. 90 tablet 2  . Blood Glucose Monitoring Suppl (ONE TOUCH ULTRA 2) w/Device KIT Use as directed 3 times daily E11.9 1 each 0  . Blood Glucose Monitoring Suppl Supplies MISC Prodigy Autocode test stripes - check blood sugar once daily 100 each 12  . fluticasone (FLONASE) 50 MCG/ACT nasal spray Place 2 sprays into both nostrils daily. 16 g 6  . gabapentin (NEURONTIN) 300 MG capsule Take 1 capsule (300 mg total) by mouth 4 (four) times daily. (Patient taking differently: Take 300 mg by mouth 3 (three) times daily. ) 120 capsule 2  . glucose blood (TRUE METRIX BLOOD GLUCOSE TEST) test strip Use as instructed 100 each 12  . ipratropium-albuterol (DUONEB) 0.5-2.5 (3) MG/3ML SOLN Take 3 mLs by nebulization every 4 (four) hours as needed. 360 mL 2  . losartan-hydrochlorothiazide (HYZAAR) 50-12.5 MG tablet Take  1 tablet by mouth daily. (Patient taking differently: Take 1 tablet by mouth every evening. ) 90 tablet 3  . meloxicam (MOBIC) 15 MG tablet Take 1 tablet (15 mg total) by mouth daily. Take with food 30 tablet 2  . metFORMIN (GLUCOPHAGE XR) 500 MG 24 hr tablet Take 1 tablet (500 mg total) by mouth daily with breakfast. 90 tablet 3  . pravastatin (PRAVACHOL) 20 MG tablet Take 1 tablet (20 mg total) by mouth daily. (Patient taking differently: Take 20 mg by mouth at bedtime. ) 90 tablet 3  . sodium chloride (OCEAN) 0.65 % SOLN nasal spray Place 1 spray into both nostrils as needed for congestion. 30 mL 2  .  TRUEPLUS LANCETS 28G MISC Check glucose 2 times a day 100 each 3  . acetaminophen (TYLENOL) 500 MG tablet Take 500 mg by mouth every 6 (six) hours as needed.    . cyclobenzaprine (FLEXERIL) 10 MG tablet Take 1 tablet (10 mg total) by mouth 3 (three) times daily as needed for muscle spasms. (Patient not taking: Reported on 05/10/2017) 50 tablet 1  . diclofenac sodium (VOLTAREN) 1 % GEL Apply 2 g 4 (four) times daily topically. (Patient not taking: Reported on 05/10/2017) 1 Tube 5  . doxycycline (VIBRAMYCIN) 100 MG capsule Take 1 capsule (100 mg total) by mouth 2 (two) times daily. (Patient not taking: Reported on 06/08/2017) 20 capsule 0  . guaiFENesin (MUCINEX) 600 MG 12 hr tablet Take 600 mg by mouth 2 (two) times daily as needed for cough or to loosen phlegm.    . montelukast (SINGULAIR) 10 MG tablet Take 1 tablet (10 mg total) by mouth at bedtime. (Patient not taking: Reported on 06/08/2017) 30 tablet 3  . predniSONE (DELTASONE) 50 MG tablet 1 tablet PO daily (Patient not taking: Reported on 06/08/2017) 5 tablet 0   No current facility-administered medications on file prior to visit.     Allergies  Allergen Reactions  . Phenergan [Promethazine Hcl] Itching    Social History   Socioeconomic History  . Marital status: Married    Spouse name: Not on file  . Number of children: Not on file  . Years of education: Not on file  . Highest education level: Not on file  Social Needs  . Financial resource strain: Not on file  . Food insecurity - worry: Not on file  . Food insecurity - inability: Not on file  . Transportation needs - medical: Not on file  . Transportation needs - non-medical: Not on file  Occupational History  . Not on file  Tobacco Use  . Smoking status: Passive Smoke Exposure - Never Smoker  . Smokeless tobacco: Never Used  . Tobacco comment: passive cig smoking at work  1994- 2012  Substance and Sexual Activity  . Alcohol use: No  . Drug use: No  . Sexual activity: Yes    Other Topics Concern  . Not on file  Social History Narrative  . Not on file    Family History  Problem Relation Age of Onset  . Hypertension Mother   . Hypertension Father   . Heart Problems Brother        Heart transplant, pt does not know cause     Past Surgical History:  Procedure Laterality Date  . ANTERIOR CERVICAL DECOMP/DISCECTOMY FUSION  ~ 1995   "titanium"  . APPENDECTOMY  ~ 1977  . BACK SURGERY     x 2  L 4disectomy  . INGUINAL HERNIA REPAIR Right ~  2000  . LUMBAR DISC SURGERY  ~ 2000; ~2006  . NASAL SINUS SURGERY  ~ 2007    ROS: Review of Systems  Neg except as stated above  PHYSICAL EXAM: BP (!) 162/98 (BP Location: Right Arm, Patient Position: Sitting, Cuff Size: Normal)   Pulse 72   Temp 97.7 F (36.5 C) (Oral)   Resp 18   Ht 5' 10"  (1.778 m)   Wt 176 lb (79.8 kg)   SpO2 96%   BMI 25.25 kg/m   Physical Exam  General appearance - alert, well appearing, and in no distress Mental status - alert, oriented to person, place, and time, normal mood, behavior, speech, dress, motor activity, and thought processes Nose - mild edema nasal turbinates LT>RT. No blood seen Mouth - mucous membranes moist, pharynx normal without lesions Neck - supple, no significant adenopathy Chest - clear to auscultation, no wheezes, rales or rhonchi, symmetric air entry Heart - normal rate, regular rhythm, normal S1, S2, no murmurs, rubs, clicks or gallops Extremities - no LE edema Results for orders placed or performed in visit on 06/08/17  Glucose (CBG)  Result Value Ref Range   POC Glucose 161 (A) 70 - 99 mg/dl   Lab Results  Component Value Date   HGBA1C 5.7 (H) 05/05/2017     ASSESSMENT AND PLAN: 1. Moderate persistent asthma without complication Need for better control.  Unable to afford Breo.  Prescribed Symbicort instead.  Advised to wash mouth after each use. - budesonide-formoterol (SYMBICORT) 80-4.5 MCG/ACT inhaler; Inhale 2 puffs into the lungs 2 (two)  times daily.  Dispense: 3 Inhaler; Refill: 3 - Ambulatory referral to Pulmonology  2. Upper respiratory tract infection, unspecified type -use Flonase nasal spray for 1 wk then 3 x a wk to help prevent nose bleeds -Coricidin HBP OTC PRN for congestion  3. Diabetes mellitus due to underlying condition with diabetic neuropathy, without long-term current use of insulin (HCC) controlled - Glucose (CBG) - glucose blood (TRUE METRIX BLOOD GLUCOSE TEST) test strip; Check Blood sugar 1-2 x a day.  Has Prodigy Auto code meter  Dispense: 100 each; Refill: 12  4. Essential hypertension Uncontrolled.  Patient to take medications when he returns home today.  5. Colon cancer screening - Ambulatory referral to Gastroenterology  Patient was given the opportunity to ask questions.  Patient verbalized understanding of the plan and was able to repeat key elements of the plan.   Orders Placed This Encounter  Procedures  . Glucose (CBG)     Requested Prescriptions    No prescriptions requested or ordered in this encounter    No Follow-up on file.  Karle Plumber, MD, FACP

## 2017-06-08 NOTE — Progress Notes (Signed)
cPatient is here for asthma and said he's breathing has been hard lately.  Used his machine 3x a day.

## 2017-06-09 ENCOUNTER — Encounter: Payer: Self-pay | Admitting: Gastroenterology

## 2017-06-12 ENCOUNTER — Ambulatory Visit: Payer: Medicare Other | Attending: Nurse Practitioner | Admitting: Nurse Practitioner

## 2017-06-12 ENCOUNTER — Encounter: Payer: Self-pay | Admitting: Nurse Practitioner

## 2017-06-12 VITALS — BP 148/91 | HR 82 | Temp 98.0°F | Ht 70.0 in | Wt 177.0 lb

## 2017-06-12 DIAGNOSIS — G4733 Obstructive sleep apnea (adult) (pediatric): Secondary | ICD-10-CM | POA: Diagnosis not present

## 2017-06-12 DIAGNOSIS — Z7984 Long term (current) use of oral hypoglycemic drugs: Secondary | ICD-10-CM | POA: Diagnosis not present

## 2017-06-12 DIAGNOSIS — R140 Abdominal distension (gaseous): Secondary | ICD-10-CM | POA: Insufficient documentation

## 2017-06-12 DIAGNOSIS — J4541 Moderate persistent asthma with (acute) exacerbation: Secondary | ICD-10-CM | POA: Diagnosis not present

## 2017-06-12 DIAGNOSIS — I10 Essential (primary) hypertension: Secondary | ICD-10-CM | POA: Insufficient documentation

## 2017-06-12 DIAGNOSIS — Z8249 Family history of ischemic heart disease and other diseases of the circulatory system: Secondary | ICD-10-CM | POA: Insufficient documentation

## 2017-06-12 DIAGNOSIS — E7800 Pure hypercholesterolemia, unspecified: Secondary | ICD-10-CM | POA: Diagnosis not present

## 2017-06-12 DIAGNOSIS — Z7982 Long term (current) use of aspirin: Secondary | ICD-10-CM | POA: Diagnosis not present

## 2017-06-12 DIAGNOSIS — Z981 Arthrodesis status: Secondary | ICD-10-CM | POA: Diagnosis not present

## 2017-06-12 DIAGNOSIS — Z9889 Other specified postprocedural states: Secondary | ICD-10-CM | POA: Diagnosis not present

## 2017-06-12 DIAGNOSIS — E0840 Diabetes mellitus due to underlying condition with diabetic neuropathy, unspecified: Secondary | ICD-10-CM

## 2017-06-12 DIAGNOSIS — Z888 Allergy status to other drugs, medicaments and biological substances status: Secondary | ICD-10-CM | POA: Diagnosis not present

## 2017-06-12 DIAGNOSIS — J45909 Unspecified asthma, uncomplicated: Secondary | ICD-10-CM | POA: Diagnosis not present

## 2017-06-12 DIAGNOSIS — Z79899 Other long term (current) drug therapy: Secondary | ICD-10-CM | POA: Diagnosis not present

## 2017-06-12 DIAGNOSIS — M1990 Unspecified osteoarthritis, unspecified site: Secondary | ICD-10-CM | POA: Diagnosis not present

## 2017-06-12 DIAGNOSIS — J449 Chronic obstructive pulmonary disease, unspecified: Secondary | ICD-10-CM | POA: Diagnosis not present

## 2017-06-12 DIAGNOSIS — E1140 Type 2 diabetes mellitus with diabetic neuropathy, unspecified: Secondary | ICD-10-CM | POA: Insufficient documentation

## 2017-06-12 LAB — GLUCOSE, POCT (MANUAL RESULT ENTRY): POC GLUCOSE: 111 mg/dL — AB (ref 70–99)

## 2017-06-12 MED ORDER — METHYLPREDNISOLONE SODIUM SUCC 125 MG IJ SOLR
125.0000 mg | Freq: Once | INTRAMUSCULAR | Status: AC
  Administered 2017-06-12: 125 mg via INTRAMUSCULAR

## 2017-06-12 NOTE — Telephone Encounter (Signed)
Patient called asking for prednisone. Please fu with patient. Please call patient back

## 2017-06-12 NOTE — Progress Notes (Signed)
Assessment & Plan:  Christian [name] was seen today for asthma and bloated.  Diagnoses and all orders for this visit:  Moderate persistent asthma with exacerbation -     methylPREDNISolone sodium succinate (SOLU-MEDROL) 125 mg/2 mL injection 125 mg Take all medications as prescribed.   Diabetes mellitus due to underlying condition with diabetic neuropathy, without long-term current use of insulin (HCC) -     Glucose (CBG) Diabetes Mellitus, Type 2 : Controlled Continue medications. Keep blood sugar logs as instructed.   Patient has been counseled on age-appropriate routine health concerns for screening and prevention. These are reviewed and up-to-date. Referrals have been placed accordingly. Immunizations are up-to-date or declined.    Subjective:   Chief Complaint  Patient presents with  . Asthma    Patient is here for asthma. Pateint stated he would like a Solu Medrol shot. Patient stated he have his usual back and knee pain. Patient stated he feels draining and nasal congestion.   Christian [name]    Patient stated he's felt bloated after he ate and is concern with it.    HPI Christian [name] 57 y.o. male presents to office today requesting   Asthma Exacerbation: He has previously been evaluated here for asthma and now presents with an asthma exacerbation. This exacerbation began a few days ago.  Associated symptoms include nasal congestion, wheezing and shortness of breath.  Suspected precipitants include upper respiratory infection.  Symptoms have been gradually improving since their onset.  This is the second evaluation that has occurred during this exacerbation. The previous exacerbation occurred 1 month ago when he was diagnosed with COPD exacerbation in the ED on 05-10-2017. He has treated this current exacerbation with short-acting inhaled beta-adrenergic agonists. The patient has been deviating from this regimen as follows: He has not picked up his symbicort as instructed. He is requesting a  solumedrol injection today. States solumedrol seems to clear him "all the way up".    Abdominal Discomfort He endorses bloating sensation this morning. He denies N/V, constipation or diarrhea.   He ate an egg (fried) and a pop tart, drank coffee with milk and sugar. He had a normal bowel movement already this morning. He denies flatus or burning but states he just feels bloated. This could be lactose intolerance. I instructed him to take a TUMS or pepcid to see if this will help his bloat. He states he has been on a prescription medication in the past for GERD however he stopped taking it after his symptoms improved.    Review of Systems  Constitutional: Negative for chills and fever.  HENT: Positive for congestion. Negative for sore throat.   Respiratory: Positive for cough, shortness of breath and wheezing.   Cardiovascular: Negative.  Negative for chest pain, orthopnea and leg swelling.  Gastrointestinal:       SEE HPI  Neurological: Negative for dizziness and headaches.    Past Medical History:  Diagnosis Date  . Arthritis    "left knee" (01/22/2016)  . Asthma   . Chronic lower back pain   . COPD (chronic obstructive pulmonary disease) ([place])   . Dyspnea    with extertion  . High cholesterol   . Hypertension   . Neuropathy   . OSA on CPAP   . Secondhand smoke exposure   . Type II diabetes mellitus ([place]) dx'd 01/17/2016    Past Surgical History:  Procedure Laterality Date  . ANTERIOR CERVICAL DECOMP/DISCECTOMY FUSION  ~ 1995   "titanium"  . APPENDECTOMY  ~  1977  . BACK SURGERY     x 2  L 4disectomy  . INGUINAL HERNIA REPAIR Right ~ 2000  . LUMBAR DISC SURGERY  ~ 2000; ~2006  . NASAL SINUS SURGERY  ~ 2007    Family History  Problem Relation Age of Onset  . Hypertension Mother   . Hypertension Father   . Heart Problems Brother        Heart transplant, pt does not know cause     Social History Reviewed with no changes to be made today.   Outpatient Medications  Prior to Visit  Medication Sig Dispense Refill  . albuterol (PROAIR HFA) 108 (90 Base) MCG/ACT inhaler Inhale 1-2 puffs into the lungs every 6 (six) hours as needed for wheezing or shortness of breath. 1 Inhaler 2  . albuterol (PROVENTIL) (5 MG/ML) 0.5% nebulizer solution Take 2.5 mg by nebulization every 6 (six) hours as needed for wheezing or shortness of breath.    Marland Kitchen aspirin EC 81 MG tablet Take 1 tablet (81 mg total) by mouth daily. 90 tablet 2  . Blood Glucose Monitoring Suppl (ONE TOUCH ULTRA 2) w/Device KIT Use as directed 3 times daily E11.9 1 each 0  . Blood Glucose Monitoring Suppl Supplies MISC Prodigy Autocode test stripes - check blood sugar once daily 100 each 12  . budesonide-formoterol (SYMBICORT) 80-4.5 MCG/ACT inhaler Inhale 2 puffs into the lungs 2 (two) times daily. 3 Inhaler 3  . fluticasone (FLONASE) 50 MCG/ACT nasal spray Place 2 sprays into both nostrils daily. 16 g 6  . gabapentin (NEURONTIN) 300 MG capsule Take 1 capsule (300 mg total) by mouth 4 (four) times daily. (Patient taking differently: Take 300 mg by mouth 3 (three) times daily. ) 120 capsule 2  . glucose blood (TRUE METRIX BLOOD GLUCOSE TEST) test strip Check Blood sugar 1-2 x a day.  Has Prodigy Auto code meter 100 each 12  . guaiFENesin (MUCINEX) 600 MG 12 hr tablet Take 600 mg by mouth 2 (two) times daily as needed for cough or to loosen phlegm.    Marland Kitchen ipratropium-albuterol (DUONEB) 0.5-2.5 (3) MG/3ML SOLN Take 3 mLs by nebulization every 4 (four) hours as needed. 360 mL 2  . losartan-hydrochlorothiazide (HYZAAR) 50-12.5 MG tablet Take 1 tablet by mouth daily. (Patient taking differently: Take 1 tablet by mouth every evening. ) 90 tablet 3  . meloxicam (MOBIC) 15 MG tablet Take 1 tablet (15 mg total) by mouth daily. Take with food 30 tablet 2  . metFORMIN (GLUCOPHAGE XR) 500 MG 24 hr tablet Take 1 tablet (500 mg total) by mouth daily with breakfast. 90 tablet 3  . pravastatin (PRAVACHOL) 20 MG tablet Take 1 tablet  (20 mg total) by mouth daily. (Patient taking differently: Take 20 mg by mouth at bedtime. ) 90 tablet 3  . sodium chloride (OCEAN) 0.65 % SOLN nasal spray Place 1 spray into both nostrils as needed for congestion. 30 mL 2  . TRUEPLUS LANCETS 28G MISC Check glucose 2 times a day 100 each 3  . acetaminophen (TYLENOL) 500 MG tablet Take 500 mg by mouth every 6 (six) hours as needed.    Marland Kitchen amLODipine (NORVASC) 10 MG tablet Take 1 tablet (10 mg total) by mouth daily. (Patient not taking: Reported on 06/12/2017) 90 tablet 3  . diclofenac sodium (VOLTAREN) 1 % GEL Apply 2 g 4 (four) times daily topically. (Patient not taking: Reported on 05/10/2017) 1 Tube 5   No facility-administered medications prior to visit.  Allergies  Allergen Reactions  . Phenergan [Promethazine Hcl] Itching       Objective:    BP (!) 148/91 (BP Location: Left Arm, Patient Position: Sitting, Cuff Size: Normal)   Pulse 82   Temp 98 F (36.7 C) (Oral)   Ht _0  (1.778 m)   Wt 177 lb (80.3 kg)   SpO2 94%   BMI 25.40 kg/m  Wt Readings from Last 3 Encounters:  06/12/17 177 lb (80.3 kg)  06/08/17 176 lb (79.8 kg)  05/05/17 174 lb 9.6 oz (79.2 kg)    Physical Exam  Constitutional: He is oriented to person, place, and time. He appears well-developed and well-nourished. He is cooperative.  HENT:  Head: Normocephalic and atraumatic.  Eyes: EOM are normal.  Neck: Normal range of motion.  Cardiovascular: Normal rate, regular rhythm and normal heart sounds. Exam reveals no gallop and no friction rub.  No murmur heard. Pulmonary/Chest: Effort normal and breath sounds normal. No tachypnea. No respiratory distress. He has no decreased breath sounds. He has no wheezes (no wheezes present). He has no rhonchi. He has no rales. He exhibits no tenderness.  Abdominal: Soft. Bowel sounds are normal. He exhibits no mass. Distention: no obvious distension. There is no tenderness. There is no rebound and no guarding.    Musculoskeletal: Normal range of motion. He exhibits no edema.  Lymphadenopathy:    He has no cervical adenopathy.  Neurological: He is alert and oriented to person, place, and time. Coordination normal.  Skin: Skin is warm and dry.  Psychiatric: He has a normal mood and affect. His behavior is normal. Judgment and thought content normal.  Nursing note and vitals reviewed.      Patient has been counseled extensively about nutrition and exercise as well as the importance of adherence with medications and regular follow-up. The patient was given clear instructions to go to ER or return to medical center if symptoms don't improve, worsen or new problems develop. The patient verbalized understanding.   Follow-up: No Follow-up on file.   Gildardo Pounds, FNP-BC [facility] and [place], [place]   06/12/2017, 1:32 PM

## 2017-06-13 NOTE — Telephone Encounter (Signed)
Patient was given IM solumedrol in the office. Based on his exam he did not need a prescription for prednisone

## 2017-06-15 NOTE — Telephone Encounter (Signed)
I called the patient to inform him that Zelda didn't think he needed the prednisone. He didn't answer and I left a voicemail for the patient to call back

## 2017-06-16 ENCOUNTER — Telehealth: Payer: Self-pay | Admitting: Internal Medicine

## 2017-06-16 NOTE — Telephone Encounter (Signed)
Patient called and requested for prednisone, Patient requested for a call if able to refill. Patient requested for a hardscript. Please fu

## 2017-06-16 NOTE — Telephone Encounter (Signed)
Haven't talked to pt today. Will have to forward message to pcp

## 2017-06-16 NOTE — Telephone Encounter (Signed)
Pt. Called requesting to speak to his nurse. Pt. States that he had spoken to his nurse regarding an Rx for prednisone. Pt. States that he needs a written Rx and he would like to pick up the Rx. Please f/u.

## 2017-06-19 NOTE — Telephone Encounter (Signed)
Contacted pt and spoke with wife Garnell Phenix and informed her to let pt know that he does't need the prednisone but he does need to get the Symbicort inhaler that Dr. Wynetta Emery prescribed for daily use. Pt wife states she will let him know

## 2017-06-22 ENCOUNTER — Other Ambulatory Visit: Payer: Self-pay | Admitting: Pharmacist

## 2017-06-22 MED ORDER — IPRATROPIUM-ALBUTEROL 0.5-2.5 (3) MG/3ML IN SOLN: 3.0000 mL | RESPIRATORY_TRACT | 0 refills | Status: DC | PRN

## 2017-06-29 DIAGNOSIS — J4520 Mild intermittent asthma, uncomplicated: Secondary | ICD-10-CM | POA: Diagnosis not present

## 2017-06-29 DIAGNOSIS — I10 Essential (primary) hypertension: Secondary | ICD-10-CM | POA: Diagnosis not present

## 2017-06-29 DIAGNOSIS — G4733 Obstructive sleep apnea (adult) (pediatric): Secondary | ICD-10-CM | POA: Diagnosis not present

## 2017-06-29 DIAGNOSIS — J449 Chronic obstructive pulmonary disease, unspecified: Secondary | ICD-10-CM | POA: Diagnosis not present

## 2017-06-29 DIAGNOSIS — J45909 Unspecified asthma, uncomplicated: Secondary | ICD-10-CM | POA: Diagnosis not present

## 2017-06-30 ENCOUNTER — Telehealth: Payer: Self-pay | Admitting: Internal Medicine

## 2017-06-30 NOTE — Telephone Encounter (Signed)
[facility] CALLED AND REQUESTED FOR REFILL ON LISTED MEDICATIONS.  albuterol (PROAIR HFA) 108 (90 Base) MCG/ACT inhaler [[id number]]  ADVAIR 500/15MG   meloxicam (MOBIC) 15 MG tablet [[id number]]  fLEXIRIL 10MG   SEND TO [facility] .

## 2017-07-03 ENCOUNTER — Telehealth: Payer: Self-pay | Admitting: Internal Medicine

## 2017-07-03 DIAGNOSIS — M1712 Unilateral primary osteoarthritis, left knee: Secondary | ICD-10-CM

## 2017-07-03 NOTE — Telephone Encounter (Signed)
Patient is not on cyclobenzaprine. I have already faxed the pharmacy this.. Will forward request for meloxicam to Dr. Wynetta Emery.

## 2017-07-03 NOTE — Telephone Encounter (Signed)
Call from [employer] service [facility]) [phone] ref # [phone] requesting refill for  cyclobenzaprine (FLEXERIL) 10 MG tablet meloxicam (MOBIC) 15 MG tablet

## 2017-07-03 NOTE — Telephone Encounter (Signed)
Pharmacy call to request a refill for meloxicam (MOBIC) 15 MG tablet cyclobenzaprine (FLEXERIL) 10 MG table Please sent it to [place], [place] Please follow up

## 2017-07-06 MED ORDER — MELOXICAM 15 MG PO TABS: 15.0000 mg | ORAL_TABLET | Freq: Every day | ORAL | 2 refills | Status: DC

## 2017-07-06 NOTE — Telephone Encounter (Signed)
RF on Meloxicam sent to [facility]. I do not see rxn for Flexeril on pt's med list. So rxn not sent.

## 2017-07-14 ENCOUNTER — Encounter: Payer: Self-pay | Admitting: Gastroenterology

## 2017-07-14 ENCOUNTER — Other Ambulatory Visit: Payer: Self-pay

## 2017-07-14 ENCOUNTER — Ambulatory Visit (AMBULATORY_SURGERY_CENTER): Payer: Self-pay | Admitting: *Deleted

## 2017-07-14 VITALS — Ht 70.0 in | Wt 181.0 lb

## 2017-07-14 DIAGNOSIS — Z1211 Encounter for screening for malignant neoplasm of colon: Secondary | ICD-10-CM

## 2017-07-14 NOTE — Progress Notes (Signed)
No oxygen used at home; does use CPAP for OSA   No diet medications used  No egg or soy allergy  Registered in emmi  No anesthesia or intubation problems per pt

## 2017-07-22 ENCOUNTER — Ambulatory Visit: Payer: Medicare Other | Admitting: Pulmonary Disease

## 2017-07-22 ENCOUNTER — Ambulatory Visit: Payer: Medicare Other | Attending: Internal Medicine | Admitting: Physician Assistant

## 2017-07-22 VITALS — BP 163/92 | HR 82 | Temp 98.1°F | Resp 16 | Wt 174.0 lb

## 2017-07-22 DIAGNOSIS — E7800 Pure hypercholesterolemia, unspecified: Secondary | ICD-10-CM | POA: Insufficient documentation

## 2017-07-22 DIAGNOSIS — E0840 Diabetes mellitus due to underlying condition with diabetic neuropathy, unspecified: Secondary | ICD-10-CM

## 2017-07-22 DIAGNOSIS — G4733 Obstructive sleep apnea (adult) (pediatric): Secondary | ICD-10-CM | POA: Diagnosis not present

## 2017-07-22 DIAGNOSIS — Z7982 Long term (current) use of aspirin: Secondary | ICD-10-CM | POA: Diagnosis not present

## 2017-07-22 DIAGNOSIS — Z79899 Other long term (current) drug therapy: Secondary | ICD-10-CM | POA: Diagnosis not present

## 2017-07-22 DIAGNOSIS — Z888 Allergy status to other drugs, medicaments and biological substances status: Secondary | ICD-10-CM | POA: Diagnosis not present

## 2017-07-22 DIAGNOSIS — E1140 Type 2 diabetes mellitus with diabetic neuropathy, unspecified: Secondary | ICD-10-CM | POA: Diagnosis not present

## 2017-07-22 DIAGNOSIS — J4540 Moderate persistent asthma, uncomplicated: Secondary | ICD-10-CM

## 2017-07-22 DIAGNOSIS — J449 Chronic obstructive pulmonary disease, unspecified: Secondary | ICD-10-CM | POA: Insufficient documentation

## 2017-07-22 DIAGNOSIS — Z7984 Long term (current) use of oral hypoglycemic drugs: Secondary | ICD-10-CM | POA: Diagnosis not present

## 2017-07-22 DIAGNOSIS — I10 Essential (primary) hypertension: Secondary | ICD-10-CM | POA: Insufficient documentation

## 2017-07-22 LAB — GLUCOSE, POCT (MANUAL RESULT ENTRY): POC Glucose: 118 mg/dl — AB (ref 70–99)

## 2017-07-22 MED ORDER — PREDNISONE 10 MG PO TABS: ORAL_TABLET | ORAL | 0 refills | Status: DC

## 2017-07-22 MED ORDER — MONTELUKAST SODIUM 10 MG PO TABS: 10.0000 mg | ORAL_TABLET | Freq: Every day | ORAL | 3 refills | Status: DC

## 2017-07-22 NOTE — Progress Notes (Signed)
Patient ID: Christian [name], male   DOB: Sep 26, 1960, 57 y.o.   MRN: [id number]   Christian [name], is a 57 y.o. male  NAT:[id number]  KYH:[id number]  DOB - 10-09-1960  Subjective:  Chief Complaint and HPI: Christian [name] is a 57 y.o. male here today for wheezing X 2-3 days.  Also has cold symptoms.  Not on maintenance inhaler for asthma bc he can't afford the copay.  No f/c.  +nasal congestion and runny nose and feels as though allergies are acting up.     ROS:   Constitutional:  No f/c, No night sweats, No unexplained weight loss. EENT:  No vision changes, No blurry vision, No hearing changes. No mouth, throat, or ear problems.  Respiratory:  +cough, + SOB, + wheezing Cardiac: No CP, no palpitations GI:  No abd pain, No N/V/D. GU: No Urinary s/sx Musculoskeletal: No joint pain Neuro: No headache, no dizziness, no motor weakness.  Skin: No rash Endocrine:  No polydipsia. No polyuria.  Psych: Denies SI/HI  No problems updated.  ALLERGIES: Allergies  Allergen Reactions  . Phenergan [Promethazine Hcl] Itching    burning    PAST MEDICAL HISTORY: Past Medical History:  Diagnosis Date  . Allergy   . Arthritis    "left knee" (01/22/2016)  . Asthma   . Chronic lower back pain   . COPD (chronic obstructive pulmonary disease) ([place])   . Dyspnea    with extertion  . High cholesterol   . Hypertension   . Neuropathy   . OSA on CPAP   . Secondhand smoke exposure   . Sleep apnea    wears CPAP  . Type II diabetes mellitus ([place]) dx'd 01/17/2016    MEDICATIONS AT HOME: Prior to Admission medications   Medication Sig Start Date End Date Taking? Authorizing Provider  acetaminophen (TYLENOL) 500 MG tablet Take 500 mg by mouth every 6 (six) hours as needed.    [provider]  albuterol (PROAIR HFA) 108 (90 Base) MCG/ACT inhaler Inhale 1-2 puffs into the lungs every 6 (six) hours as needed for wheezing or shortness of breath. 03/31/17   Ladell Pier, MD  albuterol (PROVENTIL) (5  MG/ML) 0.5% nebulizer solution Take 2.5 mg by nebulization every 6 (six) hours as needed for wheezing or shortness of breath.    [provider]  amLODipine (NORVASC) 10 MG tablet Take 1 tablet (10 mg total) by mouth daily. 09/15/16   Maren Reamer, MD  aspirin EC 81 MG tablet Take 1 tablet (81 mg total) by mouth daily. 09/15/16   Maren Reamer, MD  Blood Glucose Monitoring Suppl (ONE TOUCH ULTRA 2) w/Device KIT Use as directed 3 times daily E11.9 03/31/17   Ladell Pier, MD  Blood Glucose Monitoring Suppl Supplies MISC Prodigy Autocode test stripes - check blood sugar once daily 05/27/17   Ladell Pier, MD  budesonide-formoterol (SYMBICORT) 80-4.5 MCG/ACT inhaler Inhale 2 puffs into the lungs 2 (two) times daily. 06/08/17   Ladell Pier, MD  diclofenac sodium (VOLTAREN) 1 % GEL Apply 2 g 4 (four) times daily topically. 03/03/17   Leandrew Koyanagi, MD  fluticasone (FLONASE) 50 MCG/ACT nasal spray Place 2 sprays into both nostrils daily. 09/15/16   Maren Reamer, MD  gabapentin (NEURONTIN) 300 MG capsule Take 1 capsule (300 mg total) by mouth 4 (four) times daily. Patient taking differently: Take 300 mg by mouth 3 (three) times daily.  03/31/17   Ladell Pier, MD  glucose blood (  TRUE METRIX BLOOD GLUCOSE TEST) test strip Check Blood sugar 1-2 x a day.  Has Prodigy Auto code meter 06/08/17   Ladell Pier, MD  guaiFENesin (MUCINEX) 600 MG 12 hr tablet Take 600 mg by mouth 2 (two) times daily as needed for cough or to loosen phlegm.    [provider]  ipratropium-albuterol (DUONEB) 0.5-2.5 (3) MG/3ML SOLN Take 3 mLs by nebulization every 4 (four) hours as needed. 06/22/17   Ladell Pier, MD  losartan-hydrochlorothiazide (HYZAAR) 50-12.5 MG tablet Take 1 tablet by mouth daily. Patient taking differently: Take 1 tablet by mouth every evening.  09/15/16   Lottie Mussel T, MD  meloxicam (MOBIC) 15 MG tablet Take 1 tablet (15 mg total) by mouth daily. Take  with food 07/06/17   Ladell Pier, MD  metFORMIN (GLUCOPHAGE XR) 500 MG 24 hr tablet Take 1 tablet (500 mg total) by mouth daily with breakfast. 09/15/16   Langeland, Dawn T, MD  montelukast (SINGULAIR) 10 MG tablet Take 1 tablet (10 mg total) by mouth at bedtime. 07/22/17   Argentina Donovan, PA-C  pravastatin (PRAVACHOL) 20 MG tablet Take 1 tablet (20 mg total) by mouth daily. Patient taking differently: Take 20 mg by mouth at bedtime.  09/15/16   Maren Reamer, MD  predniSONE (DELTASONE) 10 MG tablet 6,5,4,3,2,1 take each days dose in morning with food 07/22/17   Freeman Caldron M, PA-C  sodium chloride (OCEAN) 0.65 % SOLN nasal spray Place 1 spray into both nostrils as needed for congestion. 07/16/16   Maren Reamer, MD  TRUEPLUS LANCETS 28G MISC Check glucose 2 times a day 02/01/16   Lottie Mussel T, MD     Objective:  EXAM:   Vitals:   07/22/17 0912  BP: (!) 163/92  Pulse: 82  Resp: 16  Temp: 98.1 F (36.7 C)  TempSrc: Oral  SpO2: 94%  Weight: 174 lb (78.9 kg)    General appearance : A&OX3. NAD. Non-toxic-appearing HEENT: Atraumatic and Normocephalic.  PERRLA. EOM intact.  TM clear B. Mouth-MMM, post pharynx WNL w/o erythema, No PND.  +turbinates boggy and pale Neck: supple, no JVD. No cervical lymphadenopathy. No thyromegaly Chest/Lungs:  Breathing-non-labored, Good air entry bilaterally, breath sounds with mild wheezing.  pulse ox 97% after neb CVS: S1 S2 regular, no murmurs, gallops, rubs  Abdomen: Bowel sounds present, Non tender and not distended with no gaurding, rigidity or rebound. Extremities: Bilateral Lower Ext shows no edema, both legs are warm to touch with = pulse throughout Neurology:  CN II-XII grossly intact, Non focal.   Psych:  TP linear. J/I WNL. Normal speech. Appropriate eye contact and affect.  Skin:  No Rash  Data Review Lab Results  Component Value Date   HGBA1C 5.7 (H) 05/05/2017   HGBA1C 5.8 12/22/2016   HGBA1C 5.8 07/16/2016      Assessment & Plan   1. Moderate persistent asthma without complication Will try adding singulair to help control his baseline asthma as he is currently using proair daily and usu multiple times per day even when he isn't having an acute flair.  No sign of infection today - montelukast (SINGULAIR) 10 MG tablet; Take 1 tablet (10 mg total) by mouth at bedtime.  Dispense: 30 tablet; Refill: 3 - predniSONE (DELTASONE) 10 MG tablet; 6,5,4,3,2,1 take each days dose in morning with food  Dispense: 21 tablet; Refill: 0 -in office neb with albuterol Use other inhalers as prescribed.    2. Diabetes mellitus due to underlying  condition with diabetic neuropathy, without long-term current use of insulin (HCC) Controlled-continue current regimen - POCT glucose (manual entry)  Patient have been counseled extensively about nutrition and exercise  Return in about 6 weeks (around 09/02/2017) for Dr Wynetta Emery; asthma follow-up.  The patient was given clear instructions to go to ER or return to medical center if symptoms don't improve, worsen or new problems develop. The patient verbalized understanding. The patient was told to call to get lab results if they haven't heard anything in the next week.     Freeman Caldron, PA-C [facility] [place], [place]   07/22/2017, 9:33 AM

## 2017-07-28 ENCOUNTER — Encounter: Payer: Self-pay | Admitting: Gastroenterology

## 2017-07-28 ENCOUNTER — Ambulatory Visit (AMBULATORY_SURGERY_CENTER): Payer: Medicare Other | Admitting: Gastroenterology

## 2017-07-28 VITALS — BP 131/77 | HR 57 | Temp 97.7°F | Resp 15 | Ht 70.0 in | Wt 174.0 lb

## 2017-07-28 DIAGNOSIS — D125 Benign neoplasm of sigmoid colon: Secondary | ICD-10-CM

## 2017-07-28 DIAGNOSIS — Z800 Family history of malignant neoplasm of digestive organs: Secondary | ICD-10-CM | POA: Diagnosis not present

## 2017-07-28 DIAGNOSIS — Z1211 Encounter for screening for malignant neoplasm of colon: Secondary | ICD-10-CM

## 2017-07-28 DIAGNOSIS — D122 Benign neoplasm of ascending colon: Secondary | ICD-10-CM | POA: Diagnosis not present

## 2017-07-28 MED ORDER — SODIUM CHLORIDE 0.9 % IV SOLN: 500.0000 mL | Freq: Once | INTRAVENOUS | Status: DC

## 2017-07-28 NOTE — Progress Notes (Signed)
A and O x3. Report to RN. Tolerated MAC anesthesia well.

## 2017-07-28 NOTE — Op Note (Signed)
[place] Patient Name: Christian [name] Procedure Date: 07/28/2017 7:55 AM MRN: [id number] Endoscopist: [place]. Loletha Carrow , MD Age: 57 Referring MD:  Date of Birth: 11-20-1960 Gender: Male Account #: [id number] Procedure:                Colonoscopy Indications:              Screening in patient at increased risk: Rectal                            cancer in brother 52 or older, This is the                            patient's first colonoscopy Medicines:                Monitored Anesthesia Care Procedure:                Pre-Anesthesia Assessment:                           - Prior to the procedure, a History and Physical                            was performed, and patient medications and                            allergies were reviewed. The patient's tolerance of                            previous anesthesia was also reviewed. The risks                            and benefits of the procedure and the sedation                            options and risks were discussed with the patient.                            All questions were answered, and informed consent                            was obtained. Anticoagulants: The patient has taken                            aspirin. It was decided not to withhold this                            medication prior to the procedure. ASA Grade                            Assessment: III - A patient with severe systemic                            disease. After reviewing the risks and benefits,  the patient was deemed in satisfactory condition to                            undergo the procedure.                           After obtaining informed consent, the colonoscope                            was passed under direct vision. Throughout the                            procedure, the patient's blood pressure, pulse, and                            oxygen saturations were monitored continuously. The        Model CF-HQ190L [id number]) scope was introduced                            through the anus and advanced to the the cecum,                            identified by appendiceal orifice and ileocecal                            valve. The colonoscopy was performed without                            difficulty. The patient tolerated the procedure                            well. The quality of the bowel preparation was                            excellent. The ileocecal valve, appendiceal                            orifice, and rectum were photographed. The bowel                            preparation used was SUPREP. The quality of the                            bowel preparation was evaluated using the BBPS                            [facility] Bowel Preparation Scale) with scores of:                            Right Colon = 2, Transverse Colon = 3 and Left                            Colon = 3. The total BBPS score equals 8. After  lavage. Scope In: 8:10:09 AM Scope Out: 8:25:55 AM Scope Withdrawal Time: 0 hours 12 minutes 52 seconds  Total Procedure Duration: 0 hours 15 minutes 46 seconds  Findings:                 The perianal and digital rectal examinations were                            normal.                           A 3 mm polyp was found in the proximal ascending                            colon. The polyp was sessile. The polyp was removed                            with a cold biopsy forceps. Resection and retrieval                            were complete.                           A 5 mm polyp was found in the proximal sigmoid                            colon. The polyp was sessile. The polyp was removed                            with a cold snare. Resection was complete, but the                            polyp tissue was not retrieved.                           The exam was otherwise without abnormality on                            direct and  retroflexion views. Complications:            No immediate complications. Estimated Blood Loss:     Estimated blood loss was minimal. Impression:               - One 3 mm polyp in the proximal ascending colon,                            removed with a cold biopsy forceps. Resected and                            retrieved.                           - One 5 mm polyp in the proximal sigmoid colon,  removed with a cold snare. Complete resection.                            Polyp tissue not retrieved.                           - The examination was otherwise normal on direct                            and retroflexion views. Recommendation:           - Patient has a contact number available for                            emergencies. The signs and symptoms of potential                            delayed complications were discussed with the                            patient. Return to normal activities tomorrow.                            Written discharge instructions were provided to the                            patient.                           - Resume previous diet.                           - Continue present medications.                           - Await pathology results.                           - Repeat colonoscopy in 5 years for surveillance.  L. Loletha Carrow, MD 07/28/2017 8:35:45 AM This report has been signed electronically.

## 2017-07-28 NOTE — Progress Notes (Signed)
No problems noted in the recovery room. maw 

## 2017-07-28 NOTE — Progress Notes (Signed)
Called to room to assist during endoscopic procedure.  Patient ID and intended procedure confirmed with present staff. Received instructions for my participation in the procedure from the performing physician.  

## 2017-07-28 NOTE — Patient Instructions (Signed)
YOU HAD AN ENDOSCOPIC PROCEDURE TODAY AT [facility]:   Refer to the procedure report that was given to you for any specific questions about what was found during the examination.  If the procedure report does not answer your questions, please call your gastroenterologist to clarify.  If you requested that your care partner not be given the details of your procedure findings, then the procedure report has been included in a sealed envelope for you to review at your convenience later.  YOU SHOULD EXPECT: Some feelings of bloating in the abdomen. Passage of more gas than usual.  Walking can help get rid of the air that was put into your GI tract during the procedure and reduce the bloating. If you had a lower endoscopy (such as a colonoscopy or flexible sigmoidoscopy) you may notice spotting of blood in your stool or on the toilet paper. If you underwent a bowel prep for your procedure, you may not have a normal bowel movement for a few days.  Please Note:  You might notice some irritation and congestion in your nose or some drainage.  This is from the oxygen used during your procedure.  There is no need for concern and it should clear up in a day or so.  SYMPTOMS TO REPORT IMMEDIATELY:   Following lower endoscopy (colonoscopy or flexible sigmoidoscopy):  Excessive amounts of blood in the stool  Significant tenderness or worsening of abdominal pains  Swelling of the abdomen that is new, acute  Fever of 100F or higher   For urgent or emergent issues, a gastroenterologist can be reached at any hour by calling [phone].   DIET:  We do recommend a small meal at first, but then you may proceed to your regular diet.  Drink plenty of fluids but you should avoid alcoholic beverages for 24 hours.  ACTIVITY:  You should plan to take it easy for the rest of today and you should NOT DRIVE or use heavy machinery until tomorrow (because of the sedation medicines used during the test).     FOLLOW UP: Our staff will call the number listed on your records the next business day following your procedure to check on you and address any questions or concerns that you may have regarding the information given to you following your procedure. If we do not reach you, we will leave a message.  However, if you are feeling well and you are not experiencing any problems, there is no need to return our call.  We will assume that you have returned to your regular daily activities without incident.  If any biopsies were taken you will be contacted by phone or by letter within the next 1-3 weeks.  Please call us at [phone] if you have not heard about the biopsies in 3 weeks.    SIGNATURES/CONFIDENTIALITY: You and/or your care partner have signed paperwork which will be entered into your electronic medical record.  These signatures attest to the fact that that the information above on your After Visit Summary has been reviewed and is understood.  Full responsibility of the confidentiality of this discharge information lies with you and/or your care-partner.    Handout was given to your care partner on polyps. Your blood sugar was 96 in the recovery room. You may resume your current medications today. Await biopsy results. Please call if any questions or concerns.

## 2017-07-29 ENCOUNTER — Telehealth: Payer: Self-pay | Admitting: *Deleted

## 2017-07-29 NOTE — Telephone Encounter (Signed)
  Follow up Call-  Call back number 07/28/2017  Post procedure Call Back phone  # [phone]  Permission to leave phone message Yes     Patient questions:  Do you have a fever, pain , or abdominal swelling? No. Pain Score  0 *  Have you tolerated food without any problems? Yes.    Have you been able to return to your normal activities? Yes.    Do you have any questions about your discharge instructions: Diet   No. Medications  No. Follow up visit  No.  Do you have questions or concerns about your Care? No.  Actions: * If pain score is 4 or above: No action needed, pain <4.

## 2017-07-29 NOTE — Pre-Procedure Instructions (Signed)
[facility]  07/29/2017      [place], [address] [place]. [place] [address] Phone: [phone] Fax: [phone]    Your procedure is scheduled on Thurs., August 06, 2017  Report to [facility] Admitting Entrance "A" at 12:45PM  Call this number if you have problems the morning of surgery:  [phone]   Remember:  Do not eat food or drink liquids after midnight.  Take these medicines the morning of surgery with A SIP OF WATER: AmLODipine (NORVASC),  Gabapentin (NEURONTIN), and Fluticasone (FLONASE). If needed Acetaminophen (TYLENOL) for pain, and Albuterol Nebulizer/Inhaler for cough or wheezing (Bring inhaler with you the day of surgery).  Follow your doctor's instruction regarding Aspirin.  As of today, stop taking all Aspirins, Vitamins, Fish oils, and Herbal medications. Also stop all NSAIDS i.e. Advil, Ibuprofen, Motrin, Aleve, Anaprox, Naproxen, BC and Goody Powders. Including: Meloxicam (MOBIC) and Diclofenac sodium (VOLTAREN) gel  How to Manage Your Diabetes Before and After Surgery  Why is it important to control my blood sugar before and after surgery? . Improving blood sugar levels before and after surgery helps healing and can limit problems. . A way of improving blood sugar control is eating a healthy diet by: o  Eating less sugar and carbohydrates o  Increasing activity/exercise o  Talking with your doctor about reaching your blood sugar goals . High blood sugars (greater than 180 mg/dL) can raise your risk of infections and slow your recovery, so you will need to focus on controlling your diabetes during the weeks before surgery. . Make sure that the doctor who takes care of your diabetes knows about your planned surgery including the date and location.  How do I manage my blood sugar before surgery? . Check your blood sugar at least 4 times a day, starting 2 days before surgery, to  make sure that the level is not too high or low. o Check your blood sugar the morning of your surgery when you wake up and every 2 hours until you get to the Short Stay unit. . If your blood sugar is less than 70 mg/dL, you will need to treat for low blood sugar: o Do not take insulin. o Treat a low blood sugar (less than 70 mg/dL) with  cup of clear juice (cranberry or apple), 4 glucose tablets, OR glucose gel. Recheck blood sugar in 15 minutes after treatment (to make sure it is greater than 70 mg/dL). If your blood sugar is not greater than 70 mg/dL on recheck, call [phone] o  for further instructions. . Report your blood sugar to the short stay nurse when you get to Short Stay.  . If you are admitted to the hospital after surgery: o Your blood sugar will be checked by the staff and you will probably be given insulin after surgery (instead of oral diabetes medicines) to make sure you have good blood sugar levels. o The goal for blood sugar control after surgery is 80-180 mg/dL.  WHAT DO I DO ABOUT MY DIABETES MEDICATION?  Marland Kitchen Do not take MetFORMIN (GLUCOPHAGE XR) the morning of surgery.  . If your CBG is greater than 220 mg/dL, call us at [phone]   Do not wear jewelry.  Do not wear lotions, powders, colognes, or deodorant.  Do not shave 48 hours prior to surgery.  Men may shave face.  Do not bring valuables to the hospital.  [facility] is  not responsible for any belongings or valuables.  Contacts, dentures or bridgework may not be worn into surgery.  Leave your suitcase in the car.  After surgery it may be brought to your room.  For patients admitted to the hospital, discharge time will be determined by your treatment team.  Patients discharged the day of surgery will not be allowed to drive home.   Special instructions:  [facility]- Preparing For Surgery  Before surgery, you can play an important role. Because skin is not sterile, your skin needs to be as free of  germs as possible. You can reduce the number of germs on your skin by washing with CHG (chlorahexidine gluconate) Soap before surgery.  CHG is an antiseptic cleaner which kills germs and bonds with the skin to continue killing germs even after washing.  Please do not use if you have an allergy to CHG or antibacterial soaps. If your skin becomes reddened/irritated stop using the CHG.  Do not shave (including legs and underarms) for at least 48 hours prior to first CHG shower. It is OK to shave your face.  Please follow these instructions carefully.   1. Shower the NIGHT BEFORE SURGERY and the MORNING OF SURGERY with CHG.   2. If you chose to wash your hair, wash your hair first as usual with your normal shampoo.  3. After you shampoo, rinse your hair and body thoroughly to remove the shampoo.  4. Use CHG as you would any other liquid soap. You can apply CHG directly to the skin and wash gently with a scrungie or a clean washcloth.   5. Apply the CHG Soap to your body ONLY FROM THE NECK DOWN.  Do not use on open wounds or open sores. Avoid contact with your eyes, ears, mouth and genitals (private parts). Wash Face and genitals (private parts)  with your normal soap.  6. Wash thoroughly, paying special attention to the area where your surgery will be performed.  7. Thoroughly rinse your body with warm water from the neck down.  8. DO NOT shower/wash with your normal soap after using and rinsing off the CHG Soap.  9. Pat yourself dry with a CLEAN TOWEL.  10. Wear CLEAN PAJAMAS to bed the night before surgery, wear comfortable clothes the morning of surgery  11. Place CLEAN SHEETS on your bed the night of your first shower and DO NOT SLEEP WITH PETS.  Day of Surgery: Do not apply any deodorants/lotions. Please wear clean clothes to the hospital/surgery center.    Please read over the following fact sheets that you were given. Pain Booklet, Coughing and Deep Breathing, MRSA Information and  Surgical Site Infection Prevention

## 2017-07-29 NOTE — Pre-Procedure Instructions (Signed)
[facility]  07/29/2017      [place], [address] [place]. [place] [address] Phone: [phone] Fax: [phone]    Your procedure is scheduled on Thursday April 11.  Report to [facility] Admitting at 12:45 P.M.  Call this number if you have problems the morning of surgery:  [phone]   Remember:  Do not eat food or drink liquids after midnight.  Take these medicines the morning of surgery with A SIP OF WATER:   Amlodipine (norvasc) Gabapentin (neurontin) Symbicort Flonase nasal spray if needed DUONEB if needed Albuterol if needed (please bring inhalers to hospital with you)  DO NOT TAKE metformin (Glucophage) the day of surgery.   7 days prior to surgery STOP taking any Aleve, Naproxen, Ibuprofen, Motrin, Advil, Goody's, BC's, all herbal medications, fish oil, and all vitamins  **FOLLOW your surgeon's instructions on stopping Aspirin. If no instructions were given, please call your surgeon's office**     How to Manage Your Diabetes Before and After Surgery  Why is it important to control my blood sugar before and after surgery? . Improving blood sugar levels before and after surgery helps healing and can limit problems. . A way of improving blood sugar control is eating a healthy diet by: o  Eating less sugar and carbohydrates o  Increasing activity/exercise o  Talking with your doctor about reaching your blood sugar goals . High blood sugars (greater than 180 mg/dL) can raise your risk of infections and slow your recovery, so you will need to focus on controlling your diabetes during the weeks before surgery. . Make sure that the doctor who takes care of your diabetes knows about your planned surgery including the date and location.  How do I manage my blood sugar before surgery? . Check your blood sugar at least 4 times a day, starting 2 days before surgery, to make sure that the  level is not too high or low. o Check your blood sugar the morning of your surgery when you wake up and every 2 hours until you get to the Short Stay unit. . If your blood sugar is less than 70 mg/dL, you will need to treat for low blood sugar: o Do not take insulin. o Treat a low blood sugar (less than 70 mg/dL) with  cup of clear juice (cranberry or apple), 4 glucose tablets, OR glucose gel. Recheck blood sugar in 15 minutes after treatment (to make sure it is greater than 70 mg/dL). If your blood sugar is not greater than 70 mg/dL on recheck, call [phone] o  for further instructions. . Report your blood sugar to the short stay nurse when you get to Short Stay.  . If you are admitted to the hospital after surgery: o Your blood sugar will be checked by the staff and you will probably be given insulin after surgery (instead of oral diabetes medicines) to make sure you have good blood sugar levels. o The goal for blood sugar control after surgery is 80-180 mg/dL.           Do not wear jewelry, make-up or nail polish.  Do not wear lotions, powders, or perfumes, or deodorant.  Do not shave 48 hours prior to surgery.  Men may shave face and neck.  Do not bring valuables to the hospital.  [facility] is not responsible for any belongings or valuables.  Contacts, dentures or bridgework may not  be worn into surgery.  Leave your suitcase in the car.  After surgery it may be brought to your room.  For patients admitted to the hospital, discharge time will be determined by your treatment team.  Patients discharged the day of surgery will not be allowed to drive home.   Special instructions:    [facility]- Preparing For Surgery  Before surgery, you can play an important role. Because skin is not sterile, your skin needs to be as free of germs as possible. You can reduce the number of germs on your skin by washing with CHG (chlorahexidine gluconate) Soap before surgery.  CHG is an  antiseptic cleaner which kills germs and bonds with the skin to continue killing germs even after washing.  Please do not use if you have an allergy to CHG or antibacterial soaps. If your skin becomes reddened/irritated stop using the CHG.  Do not shave (including legs and underarms) for at least 48 hours prior to first CHG shower. It is OK to shave your face.  Please follow these instructions carefully.   1. Shower the NIGHT BEFORE SURGERY and the MORNING OF SURGERY with CHG.   2. If you chose to wash your hair, wash your hair first as usual with your normal shampoo.  3. After you shampoo, rinse your hair and body thoroughly to remove the shampoo.  4. Use CHG as you would any other liquid soap. You can apply CHG directly to the skin and wash gently with a scrungie or a clean washcloth.   5. Apply the CHG Soap to your body ONLY FROM THE NECK DOWN.  Do not use on open wounds or open sores. Avoid contact with your eyes, ears, mouth and genitals (private parts). Wash Face and genitals (private parts)  with your normal soap.  6. Wash thoroughly, paying special attention to the area where your surgery will be performed.  7. Thoroughly rinse your body with warm water from the neck down.  8. DO NOT shower/wash with your normal soap after using and rinsing off the CHG Soap.  9. Pat yourself dry with a CLEAN TOWEL.  10. Wear CLEAN PAJAMAS to bed the night before surgery, wear comfortable clothes the morning of surgery  11. Place CLEAN SHEETS on your bed the night of your first shower and DO NOT SLEEP WITH PETS.    Day of Surgery: Do not apply any deodorants/lotions. Please wear clean clothes to the hospital/surgery center.      Please read over the following fact sheets that you were given. Coughing and Deep Breathing, MRSA Information and Surgical Site Infection Prevention

## 2017-07-30 ENCOUNTER — Inpatient Hospital Stay (HOSPITAL_COMMUNITY)
Admission: RE | Admit: 2017-07-30 | Discharge: 2017-07-30 | Disposition: A | Discharge status: Home / Self Care | Payer: Medicare Other | Source: Ambulatory Visit

## 2017-07-30 DIAGNOSIS — J4520 Mild intermittent asthma, uncomplicated: Secondary | ICD-10-CM | POA: Diagnosis not present

## 2017-07-30 DIAGNOSIS — G4733 Obstructive sleep apnea (adult) (pediatric): Secondary | ICD-10-CM | POA: Diagnosis not present

## 2017-08-04 ENCOUNTER — Other Ambulatory Visit: Payer: Self-pay

## 2017-08-04 ENCOUNTER — Other Ambulatory Visit (INDEPENDENT_AMBULATORY_CARE_PROVIDER_SITE_OTHER): Payer: Self-pay

## 2017-08-04 ENCOUNTER — Encounter (HOSPITAL_COMMUNITY): Payer: Self-pay

## 2017-08-04 ENCOUNTER — Encounter (HOSPITAL_COMMUNITY)
Admission: RE | Admit: 2017-08-04 | Discharge: 2017-08-04 | Disposition: A | Discharge status: Home / Self Care | Payer: Medicare Other | Source: Ambulatory Visit | Attending: Orthopaedic Surgery | Admitting: Orthopaedic Surgery

## 2017-08-04 DIAGNOSIS — Z79899 Other long term (current) drug therapy: Secondary | ICD-10-CM | POA: Diagnosis not present

## 2017-08-04 DIAGNOSIS — Z8249 Family history of ischemic heart disease and other diseases of the circulatory system: Secondary | ICD-10-CM | POA: Diagnosis not present

## 2017-08-04 DIAGNOSIS — J449 Chronic obstructive pulmonary disease, unspecified: Secondary | ICD-10-CM | POA: Diagnosis not present

## 2017-08-04 DIAGNOSIS — Z981 Arthrodesis status: Secondary | ICD-10-CM | POA: Diagnosis not present

## 2017-08-04 DIAGNOSIS — Z7984 Long term (current) use of oral hypoglycemic drugs: Secondary | ICD-10-CM | POA: Diagnosis not present

## 2017-08-04 DIAGNOSIS — E119 Type 2 diabetes mellitus without complications: Secondary | ICD-10-CM | POA: Diagnosis not present

## 2017-08-04 DIAGNOSIS — Z791 Long term (current) use of non-steroidal anti-inflammatories (NSAID): Secondary | ICD-10-CM | POA: Diagnosis not present

## 2017-08-04 DIAGNOSIS — Z800 Family history of malignant neoplasm of digestive organs: Secondary | ICD-10-CM | POA: Diagnosis not present

## 2017-08-04 DIAGNOSIS — R112 Nausea with vomiting, unspecified: Secondary | ICD-10-CM | POA: Diagnosis not present

## 2017-08-04 DIAGNOSIS — G4730 Sleep apnea, unspecified: Secondary | ICD-10-CM | POA: Diagnosis not present

## 2017-08-04 DIAGNOSIS — M549 Dorsalgia, unspecified: Secondary | ICD-10-CM | POA: Diagnosis not present

## 2017-08-04 DIAGNOSIS — Z7982 Long term (current) use of aspirin: Secondary | ICD-10-CM | POA: Diagnosis not present

## 2017-08-04 DIAGNOSIS — G8929 Other chronic pain: Secondary | ICD-10-CM | POA: Diagnosis not present

## 2017-08-04 DIAGNOSIS — M1712 Unilateral primary osteoarthritis, left knee: Secondary | ICD-10-CM | POA: Diagnosis not present

## 2017-08-04 DIAGNOSIS — R319 Hematuria, unspecified: Secondary | ICD-10-CM | POA: Diagnosis not present

## 2017-08-04 LAB — CBC WITH DIFFERENTIAL/PLATELET
BASOS ABS: 0.1 10*3/uL (ref 0.0–0.1)
Basophils Relative: 1 %
EOS PCT: 6 %
Eosinophils Absolute: 0.3 10*3/uL (ref 0.0–0.7)
HCT: 45.9 % (ref 39.0–52.0)
Hemoglobin: 15.2 g/dL (ref 13.0–17.0)
LYMPHS PCT: 38 %
Lymphs Abs: 1.6 10*3/uL (ref 0.7–4.0)
MCH: 29.4 pg (ref 26.0–34.0)
MCHC: 33.1 g/dL (ref 30.0–36.0)
MCV: 88.8 fL (ref 78.0–100.0)
MONO ABS: 0.3 10*3/uL (ref 0.1–1.0)
Monocytes Relative: 8 %
NEUTROS ABS: 2 10*3/uL (ref 1.7–7.7)
Neutrophils Relative %: 47 %
PLATELETS: 243 10*3/uL (ref 150–400)
RBC: 5.17 MIL/uL (ref 4.22–5.81)
RDW: 13.7 % (ref 11.5–15.5)
WBC: 4.2 10*3/uL (ref 4.0–10.5)

## 2017-08-04 LAB — GLUCOSE, CAPILLARY: Glucose-Capillary: 93 mg/dL (ref 65–99)

## 2017-08-04 LAB — COMPREHENSIVE METABOLIC PANEL
ALBUMIN: 3.7 g/dL (ref 3.5–5.0)
ALT: 17 U/L (ref 17–63)
AST: 20 U/L (ref 15–41)
Alkaline Phosphatase: 33 U/L — ABNORMAL LOW (ref 38–126)
Anion gap: 9 (ref 5–15)
BUN: 14 mg/dL (ref 6–20)
CHLORIDE: 106 mmol/L (ref 101–111)
CO2: 26 mmol/L (ref 22–32)
CREATININE: 1.26 mg/dL — AB (ref 0.61–1.24)
Calcium: 9 mg/dL (ref 8.9–10.3)
GFR calc Af Amer: >60 mL/min (ref >60)
GFR calc non Af Amer: >60 mL/min (ref >60)
GLUCOSE: 102 mg/dL — AB (ref 65–99)
POTASSIUM: 4 mmol/L (ref 3.5–5.1)
Sodium: 141 mmol/L (ref 135–145)
Total Bilirubin: 1.1 mg/dL (ref 0.3–1.2)
Total Protein: 6.3 g/dL — ABNORMAL LOW (ref 6.5–8.1)

## 2017-08-04 LAB — PROTIME-INR
INR: 1
Prothrombin Time: 13.1 seconds (ref 11.4–15.2)

## 2017-08-04 LAB — HEMOGLOBIN A1C
Hgb A1c MFr Bld: 5.9 % — ABNORMAL HIGH (ref 4.8–5.6)
MEAN PLASMA GLUCOSE: 122.63 mg/dL

## 2017-08-04 LAB — TYPE AND SCREEN
ABO/RH(D): O POS
ANTIBODY SCREEN: NEGATIVE

## 2017-08-04 LAB — SURGICAL PCR SCREEN
MRSA, PCR: NEGATIVE
STAPHYLOCOCCUS AUREUS: NEGATIVE

## 2017-08-04 LAB — APTT: APTT: 30 s (ref 24–36)

## 2017-08-05 MED ORDER — TRANEXAMIC ACID 1000 MG/10ML IV SOLN
1000.0000 mg | INTRAVENOUS | Status: AC
  Administered 2017-08-06: 1000 mg via INTRAVENOUS
  Filled 2017-08-05: qty 1100

## 2017-08-05 MED ORDER — CEFAZOLIN SODIUM-DEXTROSE 2-4 GM/100ML-% IV SOLN
2.0000 g | INTRAVENOUS | Status: AC
  Administered 2017-08-06: 2 g via INTRAVENOUS

## 2017-08-05 MED ORDER — TRANEXAMIC ACID 1000 MG/10ML IV SOLN
2000.0000 mg | INTRAVENOUS | Status: AC
  Administered 2017-08-06: 2000 mg via TOPICAL
  Filled 2017-08-05: qty 20

## 2017-08-05 NOTE — Progress Notes (Signed)
Anesthesia Chart Review:  Pt is a 57 year old male scheduled for left total knee arthroplasty on 08/06/2017 with Frankey Shown, MD  - PCP is Karle Plumber, MD - Cardiologist is Lyman Bishop, MD. Pt cleared for surgery at last office visit 04/29/17 with Almyra Deforest, [place] is Simonne Maffucci, MD. Last office visit 09/25/16 with Rexene Edison, NP  PMH includes: HTN, DM, hyperlipidemia, COPD, asthma, OSA.  Never smoker.  BMI 25.  Medications include: Albuterol, amlodipine, ASA 81 mg, DuoNeb, losartan-HCTZ, metformin, pravastatin  BP (!) 148/86   Pulse 73   Temp 36.8 C   Resp 18   Ht 5\' 10"  (1.778 m)   Wt 173 lb 1.6 oz (78.5 kg)   SpO2 100%   BMI 24.84 kg/m   Preoperative labs reviewed.   - HbA1c 5.9, glucose 102  CXR 05/10/17: No active cardiopulmonary disease.  EKG 05/10/17: Sinus rhythm. Nonspecific intraventricular conduction delay. Probable anteroseptal infarct, old.  Anteroseptal infarct present on EKG dating back to at least 01/22/16  Nuclear stress test 03/05/16:   The left ventricular ejection fraction is normal (55-65%).  Nuclear stress EF: 58%.  There was no ST segment deviation noted during stress.  The study is normal.  This is a low risk study.  Echo 01/23/16:  - Left ventricle: The cavity size was normal. There was mild concentric hypertrophy. Systolic function was vigorous. The estimated ejection fraction was in the range of 65% to 70%. Wall motion was normal; there were no regional wall motion abnormalities. Doppler parameters are consistent with abnormal left ventricular relaxation (grade 1 diastolic dysfunction). Doppler parameters are consistent with elevated ventricular end-diastolic filling pressure. - Aortic valve: There was no regurgitation. - Aortic root: The aortic root was normal in size. - Mitral valve: There was trivial regurgitation. - Left atrium: The atrium was normal in size. - Right ventricle: Systolic function was normal. - Right atrium:  The atrium was normal in size. - Tricuspid valve: There was trivial regurgitation. - Pulmonic valve: There was no regurgitation. - Pulmonary arteries: Systolic pressure was within the normal range. - Inferior vena cava: The vessel was normal in size. - Pericardium, extracardiac: There was no pericardial effusion.  If no changes, I anticipate pt can proceed with surgery as scheduled.   Willeen Cass, FNP-BC [facility] Short Stay Surgical Center/Anesthesiology Phone: [phone] 08/05/2017 12:28 PM

## 2017-08-06 ENCOUNTER — Ambulatory Visit (HOSPITAL_COMMUNITY): Payer: Medicare Other | Admitting: Anesthesiology

## 2017-08-06 ENCOUNTER — Encounter (HOSPITAL_COMMUNITY): Payer: Self-pay | Admitting: *Deleted

## 2017-08-06 ENCOUNTER — Inpatient Hospital Stay (HOSPITAL_COMMUNITY): Payer: Medicare Other

## 2017-08-06 ENCOUNTER — Inpatient Hospital Stay (HOSPITAL_COMMUNITY)
Admission: AD | Admit: 2017-08-06 | Discharge: 2017-08-10 | DRG: 470 | Disposition: A | Discharge status: Skilled Nursing Facility | Payer: Medicare Other | Source: Ambulatory Visit | Attending: Orthopaedic Surgery | Admitting: Orthopaedic Surgery

## 2017-08-06 ENCOUNTER — Other Ambulatory Visit: Payer: Self-pay

## 2017-08-06 ENCOUNTER — Encounter (HOSPITAL_COMMUNITY)
Admission: AD | Disposition: A | Discharge status: Skilled Nursing Facility | Payer: Self-pay | Source: Ambulatory Visit | Attending: Orthopaedic Surgery

## 2017-08-06 ENCOUNTER — Ambulatory Visit (HOSPITAL_COMMUNITY): Payer: Medicare Other | Admitting: Emergency Medicine

## 2017-08-06 DIAGNOSIS — M6281 Muscle weakness (generalized): Secondary | ICD-10-CM | POA: Diagnosis not present

## 2017-08-06 DIAGNOSIS — Z791 Long term (current) use of non-steroidal anti-inflammatories (NSAID): Secondary | ICD-10-CM | POA: Diagnosis not present

## 2017-08-06 DIAGNOSIS — Z79899 Other long term (current) drug therapy: Secondary | ICD-10-CM | POA: Diagnosis not present

## 2017-08-06 DIAGNOSIS — G8929 Other chronic pain: Secondary | ICD-10-CM | POA: Diagnosis present

## 2017-08-06 DIAGNOSIS — Z96652 Presence of left artificial knee joint: Secondary | ICD-10-CM | POA: Diagnosis not present

## 2017-08-06 DIAGNOSIS — R112 Nausea with vomiting, unspecified: Secondary | ICD-10-CM | POA: Diagnosis present

## 2017-08-06 DIAGNOSIS — E119 Type 2 diabetes mellitus without complications: Secondary | ICD-10-CM | POA: Diagnosis not present

## 2017-08-06 DIAGNOSIS — J449 Chronic obstructive pulmonary disease, unspecified: Secondary | ICD-10-CM | POA: Diagnosis not present

## 2017-08-06 DIAGNOSIS — E1140 Type 2 diabetes mellitus with diabetic neuropathy, unspecified: Secondary | ICD-10-CM | POA: Diagnosis not present

## 2017-08-06 DIAGNOSIS — Z981 Arthrodesis status: Secondary | ICD-10-CM

## 2017-08-06 DIAGNOSIS — R6889 Other general symptoms and signs: Secondary | ICD-10-CM | POA: Diagnosis not present

## 2017-08-06 DIAGNOSIS — Z7984 Long term (current) use of oral hypoglycemic drugs: Secondary | ICD-10-CM | POA: Diagnosis not present

## 2017-08-06 DIAGNOSIS — R262 Difficulty in walking, not elsewhere classified: Secondary | ICD-10-CM | POA: Diagnosis not present

## 2017-08-06 DIAGNOSIS — R319 Hematuria, unspecified: Secondary | ICD-10-CM | POA: Diagnosis present

## 2017-08-06 DIAGNOSIS — Z7982 Long term (current) use of aspirin: Secondary | ICD-10-CM

## 2017-08-06 DIAGNOSIS — G4730 Sleep apnea, unspecified: Secondary | ICD-10-CM | POA: Diagnosis present

## 2017-08-06 DIAGNOSIS — J45998 Other asthma: Secondary | ICD-10-CM | POA: Diagnosis not present

## 2017-08-06 DIAGNOSIS — Z800 Family history of malignant neoplasm of digestive organs: Secondary | ICD-10-CM

## 2017-08-06 DIAGNOSIS — M545 Low back pain: Secondary | ICD-10-CM | POA: Diagnosis not present

## 2017-08-06 DIAGNOSIS — G629 Polyneuropathy, unspecified: Secondary | ICD-10-CM | POA: Diagnosis not present

## 2017-08-06 DIAGNOSIS — M549 Dorsalgia, unspecified: Secondary | ICD-10-CM | POA: Diagnosis present

## 2017-08-06 DIAGNOSIS — G4733 Obstructive sleep apnea (adult) (pediatric): Secondary | ICD-10-CM | POA: Diagnosis not present

## 2017-08-06 DIAGNOSIS — Z8249 Family history of ischemic heart disease and other diseases of the circulatory system: Secondary | ICD-10-CM

## 2017-08-06 DIAGNOSIS — G8918 Other acute postprocedural pain: Secondary | ICD-10-CM | POA: Diagnosis not present

## 2017-08-06 DIAGNOSIS — M1712 Unilateral primary osteoarthritis, left knee: Principal | ICD-10-CM | POA: Diagnosis present

## 2017-08-06 DIAGNOSIS — I10 Essential (primary) hypertension: Secondary | ICD-10-CM | POA: Diagnosis not present

## 2017-08-06 DIAGNOSIS — Z96659 Presence of unspecified artificial knee joint: Secondary | ICD-10-CM

## 2017-08-06 DIAGNOSIS — M13862 Other specified arthritis, left knee: Secondary | ICD-10-CM | POA: Diagnosis not present

## 2017-08-06 DIAGNOSIS — G8911 Acute pain due to trauma: Secondary | ICD-10-CM | POA: Diagnosis not present

## 2017-08-06 DIAGNOSIS — Z471 Aftercare following joint replacement surgery: Secondary | ICD-10-CM | POA: Diagnosis not present

## 2017-08-06 DIAGNOSIS — J302 Other seasonal allergic rhinitis: Secondary | ICD-10-CM | POA: Diagnosis not present

## 2017-08-06 DIAGNOSIS — E785 Hyperlipidemia, unspecified: Secondary | ICD-10-CM | POA: Diagnosis not present

## 2017-08-06 DIAGNOSIS — S8990XA Unspecified injury of unspecified lower leg, initial encounter: Secondary | ICD-10-CM | POA: Diagnosis not present

## 2017-08-06 HISTORY — PX: TOTAL KNEE ARTHROPLASTY: SHX125

## 2017-08-06 LAB — GLUCOSE, CAPILLARY
GLUCOSE-CAPILLARY: 108 mg/dL — AB (ref 65–99)
Glucose-Capillary: 77 mg/dL (ref 65–99)

## 2017-08-06 SURGERY — ARTHROPLASTY, KNEE, TOTAL
Anesthesia: Monitor Anesthesia Care | Site: Knee | Laterality: Left

## 2017-08-06 MED ORDER — ASPIRIN EC 325 MG PO TBEC: 325.0000 mg | DELAYED_RELEASE_TABLET | Freq: Two times a day (BID) | ORAL | 0 refills | Status: DC

## 2017-08-06 MED ORDER — METHOCARBAMOL 1000 MG/10ML IJ SOLN
500.0000 mg | Freq: Four times a day (QID) | INTRAVENOUS | Status: DC | PRN
  Filled 2017-08-06: qty 5

## 2017-08-06 MED ORDER — BUPIVACAINE LIPOSOME 1.3 % IJ SUSP
INTRAMUSCULAR | Status: DC | PRN
  Administered 2017-08-06: 20 mL

## 2017-08-06 MED ORDER — ONDANSETRON HCL 4 MG PO TABS: 4.0000 mg | ORAL_TABLET | Freq: Four times a day (QID) | ORAL | Status: DC | PRN

## 2017-08-06 MED ORDER — FENTANYL CITRATE (PF) 250 MCG/5ML IJ SOLN
INTRAMUSCULAR | Status: AC
  Filled 2017-08-06: qty 5

## 2017-08-06 MED ORDER — GABAPENTIN 300 MG PO CAPS
300.0000 mg | ORAL_CAPSULE | Freq: Four times a day (QID) | ORAL | Status: DC
  Administered 2017-08-06 – 2017-08-10 (×14): 300 mg via ORAL
  Filled 2017-08-06 (×14): qty 1

## 2017-08-06 MED ORDER — BUPIVACAINE LIPOSOME 1.3 % IJ SUSP
20.0000 mL | INTRAMUSCULAR | Status: DC
  Filled 2017-08-06: qty 20

## 2017-08-06 MED ORDER — OXYCODONE HCL 5 MG PO TABS
10.0000 mg | ORAL_TABLET | ORAL | Status: DC | PRN
  Administered 2017-08-06 – 2017-08-07 (×3): 15 mg via ORAL
  Administered 2017-08-08 (×2): 10 mg via ORAL
  Administered 2017-08-09 (×4): 15 mg via ORAL
  Filled 2017-08-06: qty 3
  Filled 2017-08-06: qty 2
  Filled 2017-08-06 (×5): qty 3
  Filled 2017-08-06: qty 2
  Filled 2017-08-06 (×2): qty 3

## 2017-08-06 MED ORDER — METHOCARBAMOL 750 MG PO TABS: 750.0000 mg | ORAL_TABLET | Freq: Two times a day (BID) | ORAL | 0 refills | Status: DC | PRN

## 2017-08-06 MED ORDER — HYDROCHLOROTHIAZIDE 12.5 MG PO CAPS
12.5000 mg | ORAL_CAPSULE | Freq: Every day | ORAL | Status: DC
  Administered 2017-08-06 – 2017-08-10 (×5): 12.5 mg via ORAL
  Filled 2017-08-06 (×5): qty 1

## 2017-08-06 MED ORDER — LIDOCAINE 2% (20 MG/ML) 5 ML SYRINGE
INTRAMUSCULAR | Status: AC
  Filled 2017-08-06: qty 5

## 2017-08-06 MED ORDER — MENTHOL 3 MG MT LOZG: 1.0000 | LOZENGE | OROMUCOSAL | Status: DC | PRN

## 2017-08-06 MED ORDER — INSULIN ASPART 100 UNIT/ML ~~LOC~~ SOLN: 0.0000 [IU] | Freq: Every day | SUBCUTANEOUS | Status: DC

## 2017-08-06 MED ORDER — CHLORHEXIDINE GLUCONATE 4 % EX LIQD: 60.0000 mL | Freq: Once | CUTANEOUS | Status: DC

## 2017-08-06 MED ORDER — MIDAZOLAM HCL 2 MG/2ML IJ SOLN
2.0000 mg | Freq: Once | INTRAMUSCULAR | Status: AC
  Administered 2017-08-06: 2 mg via INTRAVENOUS

## 2017-08-06 MED ORDER — BUPIVACAINE-EPINEPHRINE (PF) 0.5% -1:200000 IJ SOLN
INTRAMUSCULAR | Status: DC | PRN
  Administered 2017-08-06: 30 mL via PERINEURAL

## 2017-08-06 MED ORDER — ONDANSETRON HCL 4 MG/2ML IJ SOLN
INTRAMUSCULAR | Status: DC | PRN
  Administered 2017-08-06: 4 mg via INTRAVENOUS

## 2017-08-06 MED ORDER — LOSARTAN POTASSIUM 50 MG PO TABS
50.0000 mg | ORAL_TABLET | Freq: Every day | ORAL | Status: DC
  Administered 2017-08-06 – 2017-08-10 (×5): 50 mg via ORAL
  Filled 2017-08-06 (×5): qty 1

## 2017-08-06 MED ORDER — FENTANYL CITRATE (PF) 100 MCG/2ML IJ SOLN
25.0000 ug | INTRAMUSCULAR | Status: DC | PRN
  Administered 2017-08-06 (×3): 50 ug via INTRAVENOUS

## 2017-08-06 MED ORDER — PRAVASTATIN SODIUM 20 MG PO TABS
20.0000 mg | ORAL_TABLET | Freq: Every day | ORAL | Status: DC
  Administered 2017-08-06 – 2017-08-09 (×4): 20 mg via ORAL
  Filled 2017-08-06 (×5): qty 1

## 2017-08-06 MED ORDER — DEXAMETHASONE SODIUM PHOSPHATE 10 MG/ML IJ SOLN
10.0000 mg | Freq: Once | INTRAMUSCULAR | Status: AC
  Administered 2017-08-07: 10 mg via INTRAVENOUS
  Filled 2017-08-06: qty 1

## 2017-08-06 MED ORDER — HYDROMORPHONE HCL 1 MG/ML IJ SOLN
0.5000 mg | INTRAMUSCULAR | Status: DC | PRN
  Administered 2017-08-06 – 2017-08-07 (×3): 1 mg via INTRAVENOUS
  Filled 2017-08-06 (×3): qty 1

## 2017-08-06 MED ORDER — HYDROMORPHONE HCL 1 MG/ML IJ SOLN
INTRAMUSCULAR | Status: AC
  Filled 2017-08-06: qty 1

## 2017-08-06 MED ORDER — CEFAZOLIN SODIUM-DEXTROSE 2-4 GM/100ML-% IV SOLN
2.0000 g | Freq: Four times a day (QID) | INTRAVENOUS | Status: AC
  Administered 2017-08-06 – 2017-08-07 (×3): 2 g via INTRAVENOUS
  Filled 2017-08-06 (×3): qty 100

## 2017-08-06 MED ORDER — OXYCODONE HCL 5 MG PO TABS
ORAL_TABLET | ORAL | Status: AC
  Administered 2017-08-06: 5 mg via ORAL
  Filled 2017-08-06: qty 1

## 2017-08-06 MED ORDER — FENTANYL CITRATE (PF) 100 MCG/2ML IJ SOLN
INTRAMUSCULAR | Status: AC
  Administered 2017-08-06: 50 ug via INTRAVENOUS
  Filled 2017-08-06: qty 2

## 2017-08-06 MED ORDER — OXYCODONE HCL 5 MG PO TABS
5.0000 mg | ORAL_TABLET | Freq: Once | ORAL | Status: AC | PRN
  Administered 2017-08-06: 5 mg via ORAL

## 2017-08-06 MED ORDER — FENTANYL CITRATE (PF) 100 MCG/2ML IJ SOLN
50.0000 ug | Freq: Once | INTRAMUSCULAR | Status: AC
  Administered 2017-08-06: 50 ug via INTRAVENOUS

## 2017-08-06 MED ORDER — SENNOSIDES-DOCUSATE SODIUM 8.6-50 MG PO TABS: 1.0000 | ORAL_TABLET | Freq: Every evening | ORAL | 1 refills | Status: DC | PRN

## 2017-08-06 MED ORDER — LACTATED RINGERS IV SOLN
INTRAVENOUS | Status: DC
  Administered 2017-08-06 – 2017-08-07 (×3): via INTRAVENOUS

## 2017-08-06 MED ORDER — SODIUM CHLORIDE 0.9 % IV SOLN
INTRAVENOUS | Status: DC
  Administered 2017-08-07 (×2): via INTRAVENOUS

## 2017-08-06 MED ORDER — SORBITOL 70 % SOLN: 30.0000 mL | Freq: Every day | Status: DC | PRN

## 2017-08-06 MED ORDER — OXYCODONE HCL ER 10 MG PO T12A
10.0000 mg | EXTENDED_RELEASE_TABLET | Freq: Two times a day (BID) | ORAL | Status: DC
  Administered 2017-08-06 – 2017-08-10 (×8): 10 mg via ORAL
  Filled 2017-08-06 (×8): qty 1

## 2017-08-06 MED ORDER — ALBUTEROL SULFATE HFA 108 (90 BASE) MCG/ACT IN AERS: 1.0000 | INHALATION_SPRAY | Freq: Four times a day (QID) | RESPIRATORY_TRACT | Status: DC | PRN

## 2017-08-06 MED ORDER — OXYCODONE HCL 5 MG/5ML PO SOLN: 5.0000 mg | Freq: Once | ORAL | Status: AC | PRN

## 2017-08-06 MED ORDER — BUPIVACAINE IN DEXTROSE 0.75-8.25 % IT SOLN
INTRATHECAL | Status: DC | PRN
  Administered 2017-08-06: 15 mg via INTRATHECAL

## 2017-08-06 MED ORDER — DOCUSATE SODIUM 100 MG PO CAPS
100.0000 mg | ORAL_CAPSULE | Freq: Two times a day (BID) | ORAL | Status: DC
  Administered 2017-08-06 – 2017-08-10 (×8): 100 mg via ORAL
  Filled 2017-08-06 (×8): qty 1

## 2017-08-06 MED ORDER — IPRATROPIUM-ALBUTEROL 0.5-2.5 (3) MG/3ML IN SOLN: 3.0000 mL | RESPIRATORY_TRACT | Status: DC | PRN

## 2017-08-06 MED ORDER — PHENOL 1.4 % MT LIQD: 1.0000 | OROMUCOSAL | Status: DC | PRN

## 2017-08-06 MED ORDER — MAGNESIUM CITRATE PO SOLN: 1.0000 | Freq: Once | ORAL | Status: DC | PRN

## 2017-08-06 MED ORDER — PROPOFOL 10 MG/ML IV BOLUS
INTRAVENOUS | Status: DC | PRN
  Administered 2017-08-06: 30 mg via INTRAVENOUS
  Administered 2017-08-06 (×5): 20 mg via INTRAVENOUS

## 2017-08-06 MED ORDER — LACTATED RINGERS IV SOLN: INTRAVENOUS | Status: DC

## 2017-08-06 MED ORDER — ALBUTEROL SULFATE (5 MG/ML) 0.5% IN NEBU: 2.5000 mg | INHALATION_SOLUTION | Freq: Four times a day (QID) | RESPIRATORY_TRACT | Status: DC | PRN

## 2017-08-06 MED ORDER — MIDAZOLAM HCL 2 MG/2ML IJ SOLN
INTRAMUSCULAR | Status: AC
  Administered 2017-08-06: 2 mg via INTRAVENOUS
  Filled 2017-08-06: qty 2

## 2017-08-06 MED ORDER — OXYCODONE-ACETAMINOPHEN 5-325 MG PO TABS: 1.0000 | ORAL_TABLET | ORAL | 0 refills | Status: DC | PRN

## 2017-08-06 MED ORDER — ALUM & MAG HYDROXIDE-SIMETH 200-200-20 MG/5ML PO SUSP: 30.0000 mL | ORAL | Status: DC | PRN

## 2017-08-06 MED ORDER — ONDANSETRON HCL 4 MG/2ML IJ SOLN
INTRAMUSCULAR | Status: AC
  Filled 2017-08-06: qty 4

## 2017-08-06 MED ORDER — 0.9 % SODIUM CHLORIDE (POUR BTL) OPTIME
TOPICAL | Status: DC | PRN
  Administered 2017-08-06: 1000 mL

## 2017-08-06 MED ORDER — ONDANSETRON HCL 4 MG/2ML IJ SOLN
4.0000 mg | Freq: Four times a day (QID) | INTRAMUSCULAR | Status: DC | PRN
  Administered 2017-08-07: 4 mg via INTRAVENOUS
  Filled 2017-08-06: qty 2

## 2017-08-06 MED ORDER — SODIUM CHLORIDE 0.9% FLUSH
INTRAVENOUS | Status: DC | PRN
  Administered 2017-08-06: 40 mL

## 2017-08-06 MED ORDER — FLUTICASONE PROPIONATE 50 MCG/ACT NA SUSP
2.0000 | Freq: Every day | NASAL | Status: DC
  Administered 2017-08-06 – 2017-08-09 (×4): 2 via NASAL
  Filled 2017-08-06: qty 16

## 2017-08-06 MED ORDER — OXYCODONE HCL 5 MG PO TABS
5.0000 mg | ORAL_TABLET | ORAL | Status: DC | PRN
  Administered 2017-08-07: 10 mg via ORAL
  Filled 2017-08-06: qty 2

## 2017-08-06 MED ORDER — MONTELUKAST SODIUM 10 MG PO TABS
10.0000 mg | ORAL_TABLET | Freq: Every day | ORAL | Status: DC
  Administered 2017-08-06 – 2017-08-09 (×4): 10 mg via ORAL
  Filled 2017-08-06 (×4): qty 1

## 2017-08-06 MED ORDER — VANCOMYCIN HCL 1000 MG IV SOLR
INTRAVENOUS | Status: AC
  Filled 2017-08-06: qty 1000

## 2017-08-06 MED ORDER — METHOCARBAMOL 500 MG PO TABS
500.0000 mg | ORAL_TABLET | Freq: Four times a day (QID) | ORAL | Status: DC | PRN
  Administered 2017-08-06 – 2017-08-09 (×7): 500 mg via ORAL
  Filled 2017-08-06 (×7): qty 1

## 2017-08-06 MED ORDER — TRANEXAMIC ACID 1000 MG/10ML IV SOLN
1000.0000 mg | Freq: Once | INTRAVENOUS | Status: AC
  Administered 2017-08-06: 1000 mg via INTRAVENOUS
  Filled 2017-08-06: qty 10

## 2017-08-06 MED ORDER — ACETAMINOPHEN 325 MG PO TABS: 325.0000 mg | ORAL_TABLET | Freq: Four times a day (QID) | ORAL | Status: DC | PRN

## 2017-08-06 MED ORDER — KETOROLAC TROMETHAMINE 15 MG/ML IJ SOLN
30.0000 mg | Freq: Four times a day (QID) | INTRAMUSCULAR | Status: AC
  Administered 2017-08-06 – 2017-08-07 (×3): 30 mg via INTRAVENOUS
  Filled 2017-08-06 (×3): qty 2

## 2017-08-06 MED ORDER — PROPOFOL 10 MG/ML IV BOLUS: INTRAVENOUS | Status: DC | PRN

## 2017-08-06 MED ORDER — VANCOMYCIN HCL 1 G IV SOLR
INTRAVENOUS | Status: DC | PRN
  Administered 2017-08-06: 1000 mg

## 2017-08-06 MED ORDER — AMLODIPINE BESYLATE 10 MG PO TABS
10.0000 mg | ORAL_TABLET | Freq: Every day | ORAL | Status: DC
  Administered 2017-08-07 – 2017-08-10 (×4): 10 mg via ORAL
  Filled 2017-08-06 (×4): qty 1

## 2017-08-06 MED ORDER — FENTANYL CITRATE (PF) 100 MCG/2ML IJ SOLN
INTRAMUSCULAR | Status: DC | PRN
  Administered 2017-08-06: 50 ug via INTRAVENOUS

## 2017-08-06 MED ORDER — MIDAZOLAM HCL 2 MG/2ML IJ SOLN
INTRAMUSCULAR | Status: AC
  Filled 2017-08-06: qty 2

## 2017-08-06 MED ORDER — DIPHENHYDRAMINE HCL 12.5 MG/5ML PO ELIX
25.0000 mg | ORAL_SOLUTION | ORAL | Status: DC | PRN
Start: 2017-08-06 — End: 2017-08-10

## 2017-08-06 MED ORDER — ONDANSETRON HCL 4 MG PO TABS: 4.0000 mg | ORAL_TABLET | Freq: Three times a day (TID) | ORAL | 0 refills | Status: DC | PRN

## 2017-08-06 MED ORDER — LOSARTAN POTASSIUM-HCTZ 50-12.5 MG PO TABS: 1.0000 | ORAL_TABLET | Freq: Every day | ORAL | Status: DC

## 2017-08-06 MED ORDER — INSULIN ASPART 100 UNIT/ML ~~LOC~~ SOLN
0.0000 [IU] | Freq: Three times a day (TID) | SUBCUTANEOUS | Status: DC
  Administered 2017-08-07 (×2): 3 [IU] via SUBCUTANEOUS
  Administered 2017-08-08 – 2017-08-10 (×4): 2 [IU] via SUBCUTANEOUS

## 2017-08-06 MED ORDER — ASPIRIN EC 325 MG PO TBEC
325.0000 mg | DELAYED_RELEASE_TABLET | Freq: Two times a day (BID) | ORAL | Status: DC
  Administered 2017-08-07 – 2017-08-10 (×7): 325 mg via ORAL
  Filled 2017-08-06 (×7): qty 1

## 2017-08-06 MED ORDER — METOCLOPRAMIDE HCL 5 MG/ML IJ SOLN
5.0000 mg | Freq: Three times a day (TID) | INTRAMUSCULAR | Status: DC | PRN
  Administered 2017-08-07: 10 mg via INTRAVENOUS
  Filled 2017-08-06: qty 2

## 2017-08-06 MED ORDER — POLYETHYLENE GLYCOL 3350 17 G PO PACK: 17.0000 g | PACK | Freq: Every day | ORAL | Status: DC | PRN

## 2017-08-06 MED ORDER — METOCLOPRAMIDE HCL 5 MG PO TABS: 5.0000 mg | ORAL_TABLET | Freq: Three times a day (TID) | ORAL | Status: DC | PRN

## 2017-08-06 MED ORDER — CEFAZOLIN SODIUM-DEXTROSE 2-4 GM/100ML-% IV SOLN
INTRAVENOUS | Status: AC
  Filled 2017-08-06: qty 100

## 2017-08-06 MED ORDER — PROPOFOL 500 MG/50ML IV EMUL
INTRAVENOUS | Status: DC | PRN
  Administered 2017-08-06: 100 ug/kg/min via INTRAVENOUS

## 2017-08-06 MED ORDER — ALBUTEROL SULFATE (2.5 MG/3ML) 0.083% IN NEBU: 2.5000 mg | INHALATION_SOLUTION | Freq: Four times a day (QID) | RESPIRATORY_TRACT | Status: DC | PRN

## 2017-08-06 MED ORDER — SODIUM CHLORIDE 0.9 % IR SOLN
Status: DC | PRN
  Administered 2017-08-06: 3000 mL

## 2017-08-06 SURGICAL SUPPLY — 75 items
ALCOHOL ISOPROPYL (RUBBING) (MISCELLANEOUS) ×3 IMPLANT
BAG DECANTER FOR FLEXI CONT (MISCELLANEOUS) ×3 IMPLANT
BANDAGE ACE 6X5 VEL STRL LF (GAUZE/BANDAGES/DRESSINGS) ×3 IMPLANT
BANDAGE ESMARK 6X9 LF (GAUZE/BANDAGES/DRESSINGS) ×1 IMPLANT
BASEPLATE TIBIAL PRIMARY SZ6 (Orthopedic Implant) ×3 IMPLANT
BEARING TRIATHLON TIBIAL 6X11 (Orthopedic Implant) ×3 IMPLANT
BENZOIN TINCTURE PRP APPL 2/3 (GAUZE/BANDAGES/DRESSINGS) ×3 IMPLANT
BLADE SAW SGTL 13.0X1.19X90.0M (BLADE) ×3 IMPLANT
BNDG ELASTIC 6X10 VLCR STRL LF (GAUZE/BANDAGES/DRESSINGS) ×3 IMPLANT
BNDG ESMARK 6X9 LF (GAUZE/BANDAGES/DRESSINGS) ×3
BOWL SMART MIX CTS (DISPOSABLE) ×3 IMPLANT
CEMENT BONE REFOBACIN R1X40 US (Cement) ×6 IMPLANT
CLOSURE STERI-STRIP 1/2X4 (GAUZE/BANDAGES/DRESSINGS) ×1
CLSR STERI-STRIP ANTIMIC 1/2X4 (GAUZE/BANDAGES/DRESSINGS) ×2 IMPLANT
COVER SURGICAL LIGHT HANDLE (MISCELLANEOUS) ×3 IMPLANT
CUFF TOURNIQUET SINGLE 34IN LL (TOURNIQUET CUFF) ×3 IMPLANT
CUFF TOURNIQUET SINGLE 44IN (TOURNIQUET CUFF) IMPLANT
DRAPE EXTREMITY T 121X128X90 (DRAPE) ×3 IMPLANT
DRAPE HALF SHEET 40X57 (DRAPES) ×3 IMPLANT
DRAPE INCISE IOBAN 66X45 STRL (DRAPES) IMPLANT
DRAPE ORTHO SPLIT 77X108 STRL (DRAPES) ×4
DRAPE POUCH INSTRU U-SHP 10X18 (DRAPES) ×3 IMPLANT
DRAPE SURG 17X11 SM STRL (DRAPES) ×6 IMPLANT
DRAPE SURG ORHT 6 SPLT 77X108 (DRAPES) ×2 IMPLANT
DRSG AQUACEL AG ADV 3.5X14 (GAUZE/BANDAGES/DRESSINGS) ×3 IMPLANT
DRSG PAD ABDOMINAL 8X10 ST (GAUZE/BANDAGES/DRESSINGS) ×6 IMPLANT
DURAPREP 26ML APPLICATOR (WOUND CARE) ×6 IMPLANT
ELECT CAUTERY BLADE 6.4 (BLADE) ×3 IMPLANT
ELECT REM PT RETURN 9FT ADLT (ELECTROSURGICAL) ×3
ELECTRODE REM PT RTRN 9FT ADLT (ELECTROSURGICAL) ×1 IMPLANT
FEMORAL POSTERIOR SZ7 LFT (Femur) ×1 IMPLANT
GAUZE SPONGE 4X4 12PLY STRL (GAUZE/BANDAGES/DRESSINGS) ×3 IMPLANT
GLOVE BIOGEL PI IND STRL 7.0 (GLOVE) ×1 IMPLANT
GLOVE BIOGEL PI INDICATOR 7.0 (GLOVE) ×2
GLOVE ECLIPSE 7.0 STRL STRAW (GLOVE) ×3 IMPLANT
GLOVE SKINSENSE NS SZ7.5 (GLOVE) ×2
GLOVE SKINSENSE STRL SZ7.5 (GLOVE) ×1 IMPLANT
GLOVE SURG SYN 7.5  E (GLOVE) ×8
GLOVE SURG SYN 7.5 E (GLOVE) ×4 IMPLANT
GOWN STRL REIN XL XLG (GOWN DISPOSABLE) ×3 IMPLANT
GOWN STRL REUS W/ TWL LRG LVL3 (GOWN DISPOSABLE) ×1 IMPLANT
GOWN STRL REUS W/TWL LRG LVL3 (GOWN DISPOSABLE) ×2
HANDPIECE INTERPULSE COAX TIP (DISPOSABLE) ×2
HOOD PEEL AWAY FLYTE STAYCOOL (MISCELLANEOUS) ×6 IMPLANT
KIT BASIN OR (CUSTOM PROCEDURE TRAY) ×3 IMPLANT
KIT TURNOVER KIT B (KITS) ×3 IMPLANT
KNEE PATELLA ASYMMETRIC 10X35 (Knees) ×3 IMPLANT
MANIFOLD NEPTUNE II (INSTRUMENTS) ×3 IMPLANT
MARKER SKIN DUAL TIP RULER LAB (MISCELLANEOUS) ×3 IMPLANT
NEEDLE SPNL 18GX3.5 QUINCKE PK (NEEDLE) ×6 IMPLANT
NS IRRIG 1000ML POUR BTL (IV SOLUTION) ×3 IMPLANT
PACK TOTAL JOINT (CUSTOM PROCEDURE TRAY) ×3 IMPLANT
PAD ARMBOARD 7.5X6 YLW CONV (MISCELLANEOUS) ×6 IMPLANT
PAD CAST 4YDX4 CTTN HI CHSV (CAST SUPPLIES) ×2 IMPLANT
PADDING CAST COTTON 4X4 STRL (CAST SUPPLIES) ×4
PADDING CAST COTTON 6X4 STRL (CAST SUPPLIES) ×3 IMPLANT
POSTERIOR FEMORAL SZ7 LFT (Femur) ×3 IMPLANT
SAW OSC TIP CART 19.5X105X1.3 (SAW) ×3 IMPLANT
SET HNDPC FAN SPRY TIP SCT (DISPOSABLE) ×1 IMPLANT
STAPLER VISISTAT 35W (STAPLE) IMPLANT
SUCTION FRAZIER HANDLE 10FR (MISCELLANEOUS) ×2
SUCTION TUBE FRAZIER 10FR DISP (MISCELLANEOUS) ×1 IMPLANT
SUT ETHILON 2 0 FS 18 (SUTURE) IMPLANT
SUT MNCRL AB 4-0 PS2 18 (SUTURE) IMPLANT
SUT VIC AB 0 CT1 27 (SUTURE) ×2
SUT VIC AB 0 CT1 27XBRD ANBCTR (SUTURE) ×1 IMPLANT
SUT VIC AB 1 CTX 27 (SUTURE) ×9 IMPLANT
SUT VIC AB 2-0 CT1 27 (SUTURE) ×6
SUT VIC AB 2-0 CT1 TAPERPNT 27 (SUTURE) ×3 IMPLANT
SYR 50ML LL SCALE MARK (SYRINGE) ×3 IMPLANT
TOWEL OR 17X24 6PK STRL BLUE (TOWEL DISPOSABLE) ×3 IMPLANT
TOWEL OR 17X26 10 PK STRL BLUE (TOWEL DISPOSABLE) ×3 IMPLANT
TRAY CATH 16FR W/PLASTIC CATH (SET/KITS/TRAYS/PACK) IMPLANT
UNDERPAD 30X30 (UNDERPADS AND DIAPERS) ×3 IMPLANT
WRAP KNEE MAXI GEL POST OP (GAUZE/BANDAGES/DRESSINGS) ×3 IMPLANT

## 2017-08-06 NOTE — Progress Notes (Signed)
Orthopedic Tech Progress Note Patient Details:  Christian [name] [facility] [birth year] [phone]  CPM Left Knee CPM Left Knee: On Left Knee Flexion (Degrees): 90 Left Knee Extension (Degrees): 0  Post Interventions Patient Tolerated: Well Instructions Provided: Care of device  [name] 08/06/2017, 5:26 PM

## 2017-08-06 NOTE — H&P (Signed)
PREOPERATIVE H&P  Chief Complaint: left knee degenerative joint disease  HPI: Christian [name] is a 57 y.o. male who presents for surgical treatment of left knee degenerative joint disease.  He denies any changes in medical history.  Past Medical History:  Diagnosis Date  . Allergy   . Arthritis    "left knee" (01/22/2016)  . Asthma   . Chronic lower back pain   . COPD (chronic obstructive pulmonary disease) ([place])   . Dyspnea    with extertion  . High cholesterol   . Hypertension   . Neuropathy   . OSA on CPAP   . Secondhand smoke exposure   . Sleep apnea    wears CPAP, not wearing now  for months  . Type II diabetes mellitus ([place]) dx'd 01/17/2016   Past Surgical History:  Procedure Laterality Date  . ANTERIOR CERVICAL DECOMP/DISCECTOMY FUSION  ~ 1995   "titanium"  . APPENDECTOMY  ~ 1977  . BACK SURGERY     x 2  L 4disectomy  . COLONOSCOPY W/ POLYPECTOMY    . INGUINAL HERNIA REPAIR Right ~ 2000  . LUMBAR DISC SURGERY  ~ 2000; ~2006  . NASAL SINUS SURGERY  ~ 2007   Social History   Socioeconomic History  . Marital status: Married    Spouse name: Not on file  . Number of children: Not on file  . Years of education: Not on file  . Highest education level: Not on file  Occupational History  . Not on file  Social Needs  . Financial resource strain: Not on file  . Food insecurity:    Worry: Not on file    Inability: Not on file  . Transportation needs:    Medical: Not on file    Non-medical: Not on file  Tobacco Use  . Smoking status: Passive Smoke Exposure - Never Smoker  . Smokeless tobacco: Never Used  . Tobacco comment: passive cig smoking at work  1994- 2012  Substance and Sexual Activity  . Alcohol use: No  . Drug use: No  . Sexual activity: Yes  Lifestyle  . Physical activity:    Days per week: Not on file    Minutes per session: Not on file  . Stress: Not on file  Relationships  . Social connections:    Talks on phone: Not on file    Gets  together: Not on file    Attends religious service: Not on file    Active member of club or organization: Not on file    Attends meetings of clubs or organizations: Not on file    Relationship status: Not on file  Other Topics Concern  . Not on file  Social History Narrative  . Not on file   Family History  Problem Relation Age of Onset  . Hypertension Mother   . Hypertension Father   . Heart Problems Brother        Heart transplant, pt does not know cause   . Rectal cancer Brother   . Colon cancer Neg Hx   . Esophageal cancer Neg Hx   . Stomach cancer Neg Hx    Allergies  Allergen Reactions  . Phenergan [Promethazine Hcl] Itching and Other (See Comments)    burning   Prior to Admission medications   Medication Sig Start Date End Date Taking? Authorizing Provider  acetaminophen (TYLENOL) 500 MG tablet Take 500 mg by mouth every 6 (six) hours as needed (for pain.).  Yes [provider]  albuterol (PROAIR HFA) 108 (90 Base) MCG/ACT inhaler Inhale 1-2 puffs into the lungs every 6 (six) hours as needed for wheezing or shortness of breath. Patient taking differently: Inhale 2 puffs into the lungs 2 (two) times daily.  03/31/17  Yes Ladell Pier, MD  albuterol (PROVENTIL) (5 MG/ML) 0.5% nebulizer solution Take 2.5 mg by nebulization every 6 (six) hours as needed for wheezing or shortness of breath.   Yes [provider]  amLODipine (NORVASC) 10 MG tablet Take 1 tablet (10 mg total) by mouth daily. 09/15/16  Yes Maren Reamer, MD  aspirin EC 81 MG tablet Take 1 tablet (81 mg total) by mouth daily. 09/15/16  Yes Langeland, Dawn T, MD  diclofenac sodium (VOLTAREN) 1 % GEL Apply 2 g 4 (four) times daily topically. Patient taking differently: Apply 2 g topically 4 (four) times daily as needed (for pain.).  03/03/17  Yes Leandrew Koyanagi, MD  fluticasone (FLONASE) 50 MCG/ACT nasal spray Place 2 sprays into both nostrils daily. 09/15/16  Yes Langeland, Dawn T, MD    gabapentin (NEURONTIN) 300 MG capsule Take 1 capsule (300 mg total) by mouth 4 (four) times daily. Patient taking differently: Take 300 mg by mouth 3 (three) times daily. Morning, noon, night. 03/31/17  Yes Ladell Pier, MD  ipratropium-albuterol (DUONEB) 0.5-2.5 (3) MG/3ML SOLN Take 3 mLs by nebulization every 4 (four) hours as needed. 06/22/17  Yes Ladell Pier, MD  losartan-hydrochlorothiazide (HYZAAR) 50-12.5 MG tablet Take 1 tablet by mouth daily. 09/15/16  Yes Langeland, Dawn T, MD  meloxicam (MOBIC) 15 MG tablet Take 1 tablet (15 mg total) by mouth daily. Take with food 07/06/17  Yes Ladell Pier, MD  metFORMIN (GLUCOPHAGE XR) 500 MG 24 hr tablet Take 1 tablet (500 mg total) by mouth daily with breakfast. 09/15/16  Yes Langeland, Dawn T, MD  montelukast (SINGULAIR) 10 MG tablet Take 1 tablet (10 mg total) by mouth at bedtime. 07/22/17  Yes Freeman Caldron M, PA-C  pravastatin (PRAVACHOL) 20 MG tablet Take 1 tablet (20 mg total) by mouth daily. Patient taking differently: Take 20 mg by mouth at bedtime.  09/15/16  Yes Langeland, Dawn T, MD  Blood Glucose Monitoring Suppl (ONE TOUCH ULTRA 2) w/Device KIT Use as directed 3 times daily E11.9 03/31/17   Ladell Pier, MD  Blood Glucose Monitoring Suppl Supplies MISC Prodigy Autocode test stripes - check blood sugar once daily 05/27/17   Ladell Pier, MD  budesonide-formoterol [facility]) 80-4.5 MCG/ACT inhaler Inhale 2 puffs into the lungs 2 (two) times daily. Patient not taking: Reported on 07/28/2017 06/08/17   Ladell Pier, MD  glucose blood (TRUE METRIX BLOOD GLUCOSE TEST) test strip Check Blood sugar 1-2 x a day.  Has Prodigy Auto code meter 06/08/17   Ladell Pier, MD  predniSONE (DELTASONE) 10 MG tablet 6,5,4,3,2,1 take each days dose in morning with food Patient not taking: Reported on 07/29/2017 07/22/17   Argentina Donovan, PA-C  TRUEPLUS LANCETS 28G MISC Check glucose 2 times a day 02/01/16   Lottie Mussel T,  MD     Positive ROS: All other systems have been reviewed and were otherwise negative with the exception of those mentioned in the HPI and as above.  Physical Exam: General: Alert, no acute distress Cardiovascular: No pedal edema Respiratory: No cyanosis, no use of accessory musculature GI: abdomen soft Skin: No lesions in the area of chief complaint Neurologic: Sensation intact distally Psychiatric:  Patient is competent for consent with normal mood and affect Lymphatic: no lymphedema  MUSCULOSKELETAL: exam stable  Assessment: left knee degenerative joint disease  Plan: Plan for Procedure(s): LEFT TOTAL KNEE ARTHROPLASTY  The risks benefits and alternatives were discussed with the patient including but not limited to the risks of nonoperative treatment, versus surgical intervention including infection, bleeding, nerve injury,  blood clots, cardiopulmonary complications, morbidity, mortality, among others, and they were willing to proceed.   Anticipated LOS equal to or greater than 2 midnights due to - Age 7 and older with one or more of the following:  - Obesity  - Expected need for hospital services (PT, OT, Nursing) required for safe  discharge  - Anticipated need for postoperative skilled nursing care or inpatient rehab  - Active co-morbidities: Diabetes and Respiratory Failure/COPD   Eduard Roux, MD   08/06/2017 8:00 AM

## 2017-08-06 NOTE — Anesthesia Preprocedure Evaluation (Signed)
Anesthesia Evaluation  Patient identified by MRN, date of birth, ID band Patient awake    Reviewed: Allergy & Precautions, NPO status , Patient's Chart, lab work & pertinent test results  Airway Mallampati: II  TM Distance: >3 FB Neck ROM: Full    Dental  (+) Dental Advisory Given   Pulmonary asthma , sleep apnea , COPD,    Pulmonary exam normal breath sounds clear to auscultation       Cardiovascular hypertension, Pt. on medications Normal cardiovascular exam Rhythm:Regular Rate:Normal  '17 Stress - Nuclear stress EF: 58%. There was no ST segment deviation noted during stress. The study is normal. This is a low risk study.  '17 TTE - Mild concentric hypertrophy. EF 65% to 70%. Grade 1 diastolic dysfunction. Trivial MR, TR.    Neuro/Psych negative neurological ROS  negative psych ROS   GI/Hepatic negative GI ROS, Neg liver ROS,   Endo/Other  diabetes, Type 2  Renal/GU negative Renal ROS  negative genitourinary   Musculoskeletal  (+) Arthritis , Chronic back pain   Abdominal   Peds  Hematology negative hematology ROS (+)   Anesthesia Other Findings   Reproductive/Obstetrics                             Anesthesia Physical Anesthesia Plan  ASA: III  Anesthesia Plan: Spinal   Post-op Pain Management:  Regional for Post-op pain   Induction:   PONV Risk Score and Plan: Treatment may vary due to age or medical condition and Propofol infusion  Airway Management Planned: Natural Airway and Simple Face Mask  Additional Equipment: None  Intra-op Plan:   Post-operative Plan:   Informed Consent: I have reviewed the patients History and Physical, chart, labs and discussed the procedure including the risks, benefits and alternatives for the proposed anesthesia with the patient or authorized representative who has indicated his/her understanding and acceptance.     Plan Discussed  with: CRNA and Anesthesiologist  Anesthesia Plan Comments:         Anesthesia Quick Evaluation

## 2017-08-06 NOTE — Anesthesia Procedure Notes (Addendum)
Anesthesia Regional Block: Adductor canal block   Pre-Anesthetic Checklist: ,, timeout performed, Correct Patient, Correct Site, Correct Laterality, Correct Procedure, Correct Position, site marked, Risks and benefits discussed,  Surgical consent,  Pre-op evaluation,  At surgeon's request and post-op pain management  Laterality: Left  Prep: chloraprep       Needles:  Injection technique: Single-shot  Needle Type: Echogenic Needle     Needle Length: 10cm  Needle Gauge: 21     Additional Needles:   Narrative:  Start time: 08/06/2017 1:39 PM End time: 08/06/2017 1:42 PM Injection made incrementally with aspirations every 5 mL.  Performed by: Personally  Anesthesiologist: Audry Pili, MD  Additional Notes: No pain on injection. No increased resistance to injection. Injection made in 5cc increments. Good needle visualization. Patient tolerated the procedure well.

## 2017-08-06 NOTE — Op Note (Signed)
Total Knee Arthroplasty Procedure Note  Preoperative diagnosis: Left knee osteoarthritis  Postoperative diagnosis:same  Operative procedure: Left total knee arthroplasty. CPT [phone]  Surgeon: N. Eduard Roux, MD  Assist: Madalyn Rob, PA-C; necessary for the timely completion of procedure and due to complexity of procedure.  Anesthesia: Spinal, regional  Tourniquet time: 75 mins  Implants used: [employer] Triatholon Femur: Pressfit PS 7 Tibia: Cemented 6 Patella: 35 mm pressfit Polyethylene: 11 mm  Indication: Christian [name] is a 57 y.o. year old male with a history of knee pain. Having failed conservative management, the patient elected to proceed with a total knee arthroplasty.  We have reviewed the risk and benefits of the surgery and they elected to proceed after voicing understanding.  Procedure:  After informed consent was obtained and understanding of the risk were voiced including but not limited to bleeding, infection, damage to surrounding structures including nerves and vessels, blood clots, leg length inequality and the failure to achieve desired results, the operative extremity was marked with verbal confirmation of the patient in the holding area.   The patient was then brought to the operating room and transported to the operating room table in the supine position.  A tourniquet was applied to the operative extremity around the upper thigh. The operative limb was then prepped and draped in the usual sterile fashion and preoperative antibiotics were administered.  A time out was performed prior to the start of surgery confirming the correct extremity, preoperative antibiotic administration, as well as team members, implants and instruments available for the case. Correct surgical site was also confirmed with preoperative radiographs. The limb was then elevated for exsanguination and the tourniquet was inflated. A midline incision was made and a standard medial  parapatellar approach was performed.  The patella was prepared and sized to a 35 mm.  A cover was placed on the patella for protection from retractors.  We then turned our attention to the femur. Posterior cruciate ligament was sacrificed. Start site was drilled in the femur and the intramedullary distal femoral cutting guide was placed, set at 5 degrees valgus, taking 9 mm of distal resection. The distal cut was made. Osteophytes were then removed. Next, the proximal tibial cutting guide was placed with appropriate slope, varus/valgus alignment and depth of resection. The proximal tibial cut was made. Gap blocks were then used to assess the extension gap and alignment, and appropriate soft tissue releases were performed. Attention was turned back to the femur, which was sized using the sizing guide to a size 7. Appropriate rotation of the femoral component was determined using epicondylar axis, Whiteside's line, and assessing the flexion gap under ligament tension. The appropriate size 4-in-1 cutting block was placed and cuts were made. Posterior femoral osteophytes and uncapped bone were then removed with the curved osteotome. The tibia was sized for a size 6 component. There was a large sclerotic area of the medial tibial that encompasses approximately 20% of the entire tibial cut surface.  Therefore the decision was made to cement the tibia.  The femoral box-cutting guide was placed and prepared for a PS femoral component. Trial components were placed, and stability was checked in full extension, mid-flexion, and deep flexion. Proper tibial rotation was determined and marked.  The patella tracked well without a lateral release. Trial components were then removed and tibial preparation performed. A posterior capsular injection comprising of 20 cc of 1.3% exparel and 40 cc of normal saline was performed for postoperative pain control. The  bony surfaces were irrigated with a pulse lavage and then dried. The  final components sized above were malleted into place. The cement was allowed to harden.  The stability of the construct was re-evaluated throughout a range of motion and found to be acceptable. The trial liner was removed, the knee was copiously irrigated, and the knee was re-evaluated for any excess bone debris. The real polyethylene liner, 11 mm thick, was inserted and checked to ensure the locking mechanism had engaged appropriately. The tourniquet was deflated and hemostasis was achieved. The wound was irrigated with normal saline.  One gram of vancomycin powder was placed in the surgical bed.  A drain was not placed.  Capsular closure was performed with a #1 vicryl, subcutaneous fat closed with a 2.0 vicryl suture, then subcutaneous tissue closed with interrupted 2.0 vicryl suture. The skin was then closed with a 3.0 monocryl. A sterile dressing was applied.  The patient was awakened in the operating room and taken to recovery in stable condition. All sponge, needle, and instrument counts were correct at the end of the case.  Position: supine  Complications: none.  Time Out: performed   Drains/Packing: none  Estimated blood loss: minimal  Returned to Recovery Room: in good condition.   Antibiotics: yes   Mechanical VTE (DVT) Prophylaxis: sequential compression devices, [place] thigh-high  Chemical VTE (DVT) Prophylaxis: aspirin  Fluid Replacement  Crystalloid: see anesthesia record Blood: none  FFP: none   Specimens Removed: 1 to pathology   Sponge and Instrument Count Correct? yes   PACU: portable radiograph - knee AP and Lateral   Admission: inpatient status  Plan/RTC: Return in 2 weeks for wound check.   Weight Bearing/Load Lower Extremity: full   N. Eduard Roux, MD [place] 4:39 PM

## 2017-08-06 NOTE — Discharge Instructions (Signed)
° °INSTRUCTIONS AFTER JOINT REPLACEMENT  ° °o Remove items at home which could result in a fall. This includes throw rugs or furniture in walking pathways °o ICE to the affected joint every three hours while awake for 30 minutes at a time, for at least the first 3-5 days, and then as needed for pain and swelling.  Continue to use ice for pain and swelling. You may notice swelling that will progress down to the foot and ankle.  This is normal after surgery.  Elevate your leg when you are not up walking on it.   °o Continue to use the breathing machine you got in the hospital (incentive spirometer) which will help keep your temperature down.  It is common for your temperature to cycle up and down following surgery, especially at night when you are not up moving around and exerting yourself.  The breathing machine keeps your lungs expanded and your temperature down. ° ° °DIET:  As you were doing prior to hospitalization, we recommend a well-balanced diet. ° °DRESSING / WOUND CARE / SHOWERING ° °You may change your surgical dressing 7 days after surgery.  Then change the dressing every day with sterile gauze.  Please use good hand washing techniques before changing the dressing.  Do not use any lotions or creams on the incision until instructed by your surgeon.  You may shower while you have the surgical dressing which is waterproof.  After removal of surgical dressing, you must cover the incision when showering. ° °ACTIVITY ° °o Increase activity slowly as tolerated, but follow the weight bearing instructions below.   °o No driving for 6 weeks or until further direction given by your physician.  You cannot drive while taking narcotics.  °o No lifting or carrying greater than 10 lbs. until further directed by your surgeon. °o Avoid periods of inactivity such as sitting longer than an hour when not asleep. This helps prevent blood clots.  °o You may return to work once you are authorized by your doctor.  ° ° ° °WEIGHT  BEARING  ° °Weight bearing as tolerated with assist device (walker, cane, etc) as directed, use it as long as suggested by your surgeon or therapist, typically at least 4-6 weeks. ° ° °EXERCISES ° °Results after joint replacement surgery are often greatly improved when you follow the exercise, range of motion and muscle strengthening exercises prescribed by your doctor. Safety measures are also important to protect the joint from further injury. Any time any of these exercises cause you to have increased pain or swelling, decrease what you are doing until you are comfortable again and then slowly increase them. If you have problems or questions, call your caregiver or physical therapist for advice.  ° °Rehabilitation is important following a joint replacement. After just a few days of immobilization, the muscles of the leg can become weakened and shrink (atrophy).  These exercises are designed to build up the tone and strength of the thigh and leg muscles and to improve motion. Often times heat used for twenty to thirty minutes before working out will loosen up your tissues and help with improving the range of motion but do not use heat for the first two weeks following surgery (sometimes heat can increase post-operative swelling).  ° °These exercises can be done on a training (exercise) mat, on the floor, on a table or on a bed. Use whatever works the best and is most comfortable for you.    Use music or television   while you are exercising so that the exercises are a pleasant break in your day. This will make your life better with the exercises acting as a break in your routine that you can look forward to.   Perform all exercises about fifteen times, three times per day or as directed.  You should exercise both the operative leg and the other leg as well. ° °Exercises include: °  °• Quad Sets - Tighten up the muscle on the front of the thigh (Quad) and hold for 5-10 seconds.   °• Straight Leg Raises - With your  knee straight (if you were given a brace, keep it on), lift the leg to 60 degrees, hold for 3 seconds, and slowly lower the leg.  Perform this exercise against resistance later as your leg gets stronger.  °• Leg Slides: Lying on your back, slowly slide your foot toward your buttocks, bending your knee up off the floor (only go as far as is comfortable). Then slowly slide your foot back down until your leg is flat on the floor again.  °• Angel Wings: Lying on your back spread your legs to the side as far apart as you can without causing discomfort.  °• Hamstring Strength:  Lying on your back, push your heel against the floor with your leg straight by tightening up the muscles of your buttocks.  Repeat, but this time bend your knee to a comfortable angle, and push your heel against the floor.  You may put a pillow under the heel to make it more comfortable if necessary.  ° °A rehabilitation program following joint replacement surgery can speed recovery and prevent re-injury in the future due to weakened muscles. Contact your doctor or a physical therapist for more information on knee rehabilitation.  ° ° °CONSTIPATION ° °Constipation is defined medically as fewer than three stools per week and severe constipation as less than one stool per week.  Even if you have a regular bowel pattern at home, your normal regimen is likely to be disrupted due to multiple reasons following surgery.  Combination of anesthesia, postoperative narcotics, change in appetite and fluid intake all can affect your bowels.  ° °YOU MUST use at least one of the following options; they are listed in order of increasing strength to get the job done.  They are all available over the counter, and you may need to use some, POSSIBLY even all of these options:   ° °Drink plenty of fluids (prune juice may be helpful) and high fiber foods °Colace 100 mg by mouth twice a day  °Senokot for constipation as directed and as needed Dulcolax (bisacodyl), take  with full glass of water  °Miralax (polyethylene glycol) once or twice a day as needed. ° °If you have tried all these things and are unable to have a bowel movement in the first 3-4 days after surgery call either your surgeon or your primary doctor.   ° °If you experience loose stools or diarrhea, hold the medications until you stool forms back up.  If your symptoms do not get better within 1 week or if they get worse, check with your doctor.  If you experience "the worst abdominal pain ever" or develop nausea or vomiting, please contact the office immediately for further recommendations for treatment. ° ° °ITCHING:  If you experience itching with your medications, try taking only a single pain pill, or even half a pain pill at a time.  You can also use Benadryl over the   counter for itching or also to help with sleep.  ° °[place] HOSE STOCKINGS:  Use stockings on both legs until for at least 2 weeks or as directed by physician office. They may be removed at night for sleeping. ° °MEDICATIONS:  See your medication summary on the “After Visit Summary” that nursing will review with you.  You may have some home medications which will be placed on hold until you complete the course of blood thinner medication.  It is important for you to complete the blood thinner medication as prescribed. ° °PRECAUTIONS:  If you experience chest pain or shortness of breath - call 911 immediately for transfer to the hospital emergency department.  ° °If you develop a fever greater that 101 F, purulent drainage from wound, increased redness or drainage from wound, foul odor from the wound/dressing, or calf pain - CONTACT YOUR SURGEON.   °                                                °FOLLOW-UP APPOINTMENTS:  If you do not already have a post-op appointment, please call the office for an appointment to be seen by your surgeon.  Guidelines for how soon to be seen are listed in your “After Visit Summary”, but are typically between 1-4 weeks  after surgery. ° °OTHER INSTRUCTIONS:  ° °Knee Replacement:  Do not place pillow under knee, focus on keeping the knee straight while resting. CPM instructions: 0-90 degrees, 2 hours in the morning, 2 hours in the afternoon, and 2 hours in the evening. Place foam block, curve side up under heel at all times except when in CPM or when walking.  DO NOT modify, tear, cut, or change the foam block in any way. ° °MAKE SURE YOU:  °• Understand these instructions.  °• Get help right away if you are not doing well or get worse.  ° ° °Thank you for letting us be a part of your medical care team.  It is a privilege we respect greatly.  We hope these instructions will help you stay on track for a fast and full recovery!  ° ° ° °

## 2017-08-06 NOTE — Transfer of Care (Signed)
Immediate Anesthesia Transfer of Care Note  Patient: Christian [name]  Procedure(s) Performed: LEFT TOTAL KNEE ARTHROPLASTY (Left Knee)  Patient Location: PACU  Anesthesia Type:MAC and Spinal  Level of Consciousness: awake, alert  and oriented  Airway & Oxygen Therapy: Patient Spontanous Breathing  Post-op Assessment: Report given to RN and Post -op Vital signs reviewed and stable  Post vital signs: Reviewed and stable  Last Vitals:  Vitals Value Taken Time  BP    Temp    Pulse 76 08/06/2017  5:24 PM  Resp 13 08/06/2017  5:24 PM  SpO2 95 % 08/06/2017  5:24 PM  Vitals shown include unvalidated device data.  Last Pain:  Vitals:   08/06/17 1300  TempSrc:   PainSc: 10-Worst pain ever      Patients Stated Pain Goal: 3 (63/01/60 1093)  Complications: No apparent anesthesia complications

## 2017-08-07 ENCOUNTER — Encounter: Payer: Self-pay | Admitting: Gastroenterology

## 2017-08-07 LAB — GLUCOSE, CAPILLARY
GLUCOSE-CAPILLARY: 115 mg/dL — AB (ref 65–99)
Glucose-Capillary: 158 mg/dL — ABNORMAL HIGH (ref 65–99)
Glucose-Capillary: 167 mg/dL — ABNORMAL HIGH (ref 65–99)
Glucose-Capillary: 145 mg/dL — ABNORMAL HIGH (ref 65–99)

## 2017-08-07 LAB — BASIC METABOLIC PANEL
ANION GAP: 8 (ref 5–15)
BUN: 9 mg/dL (ref 6–20)
CALCIUM: 8.3 mg/dL — AB (ref 8.9–10.3)
CO2: 25 mmol/L (ref 22–32)
Chloride: 103 mmol/L (ref 101–111)
Creatinine, Ser: 1 mg/dL (ref 0.61–1.24)
GLUCOSE: 162 mg/dL — AB (ref 65–99)
Potassium: 3.5 mmol/L (ref 3.5–5.1)
SODIUM: 136 mmol/L (ref 135–145)

## 2017-08-07 LAB — CBC
HCT: 40.9 % (ref 39.0–52.0)
Hemoglobin: 13.6 g/dL (ref 13.0–17.0)
MCH: 29.5 pg (ref 26.0–34.0)
MCHC: 33.3 g/dL (ref 30.0–36.0)
MCV: 88.7 fL (ref 78.0–100.0)
PLATELETS: 180 10*3/uL (ref 150–400)
RBC: 4.61 MIL/uL (ref 4.22–5.81)
RDW: 13.9 % (ref 11.5–15.5)
WBC: 8 10*3/uL (ref 4.0–10.5)

## 2017-08-07 MED ORDER — HYDROMORPHONE HCL 2 MG/ML IJ SOLN
0.5000 mg | INTRAMUSCULAR | Status: DC | PRN
  Administered 2017-08-08 (×2): 1 mg via INTRAVENOUS
  Filled 2017-08-07 (×2): qty 1

## 2017-08-07 NOTE — Progress Notes (Signed)
08/07/2017 Pt was able to get up and walk around the room again, but did not feel well enough to progress gait down the hallway.  HEP program practiced again and pt placed in CPM (which was not working, so called ortho tech for new CPM).  PT will continue to follow acutely with hopes to progress gait into the hallway tomorrow.  Barbarann Ehlers Janene Madeira, DPT [phone]    08/07/17 2006  PT Visit Information  Last PT Received On 08/07/17  Assistance Needed +1  History of Present Illness Pt is a 57 yo male s/p L TKA on 08/06/17. Hx of asthma, sleep apnea, COPD, HTN, DM, chronic low back pain, and dyspnea on exertion.  Subjective Data  Patient Stated Goal to go to rehab  Precautions  Precautions Knee  Precaution Booklet Issued Yes (comment)  Precaution Comments knee exercise handout given and reviewed  Restrictions  Weight Bearing Restrictions Yes  LLE Weight Bearing WBAT  Pain Assessment  Pain Assessment Faces  Faces Pain Scale 6  Pain Location L knee  Pain Descriptors / Indicators Grimacing  Pain Intervention(s) Limited activity within patient's tolerance;Monitored during session;Repositioned  Cognition  Arousal/Alertness Awake/alert  Behavior During Therapy WFL for tasks assessed/performed  Overall Cognitive Status Within Functional Limits for tasks assessed  General Comments Per wife he doesn't say much at baseline.   Bed Mobility  Overal bed mobility Needs Assistance  Bed Mobility Sit to Supine  Sit to supine Supervision  General bed mobility comments supervision for safety, extra time needed to lift left leg back into bed,   Transfers  Overall transfer level Needs assistance  Equipment used Rolling walker (2 wheeled)  Transfers Sit to/from Stand  Sit to Stand Min guard  General transfer comment min guard assist for safety, verbal cues for safe hand placement during transitions.   Ambulation/Gait  Ambulation/Gait assistance Min guard  Ambulation Distance (Feet) 20 Feet   Assistive device Rolling walker (2 wheeled)  Gait Pattern/deviations Step-to pattern;Antalgic;Decreased step length - left;Decreased stride length;Trunk flexed  General Gait Details Verbal cues for upright posture, RW lowered to better fit his height, mildly antalgic gait pattern with good stability at the knee.  Limited gait distance due to pt continues to not feel well (weak, exhausted, not eating well).   Balance  Overall balance assessment Needs assistance  Sitting-balance support Feet supported;No upper extremity supported  Sitting balance-Leahy Scale Good  Standing balance support Bilateral upper extremity supported;No upper extremity supported;Single extremity supported  Standing balance-Leahy Scale Fair  Exercises  Exercises Total Joint  Total Joint Exercises  Ankle Circles/Pumps AROM;Both;20 reps  Quad Sets AROM;Left;10 reps  Heel Slides AAROM;Left;10 reps  PT - End of Session  Equipment Utilized During Treatment Gait belt  Activity Tolerance Patient limited by pain;Patient limited by fatigue  Patient left in bed;with call bell/phone within reach;with family/visitor present  CPM Left Knee  CPM Left Knee On  Left Knee Flexion (Degrees) 90   PT - Assessment/Plan  PT Plan Current plan remains appropriate  PT Visit Diagnosis Muscle weakness (generalized) (M62.81);Difficulty in walking, not elsewhere classified (R26.2);Pain;Unsteadiness on feet (R26.81)  Pain - Right/Left Left  Pain - part of body Knee  PT Frequency (ACUTE ONLY) 7X/week  Follow Up Recommendations Follow surgeon's recommendation for DC plan and follow-up therapies  PT equipment Rolling walker with 5" wheels  AM-PAC PT "6 Clicks" Daily Activity Outcome Measure  Difficulty turning over in bed (including adjusting bedclothes, sheets and blankets)? 3  Difficulty moving from lying on  back to sitting on the side of the bed?  3  Difficulty sitting down on and standing up from a chair with arms (e.g., wheelchair,  bedside commode, etc,.)? 1  Help needed moving to and from a bed to chair (including a wheelchair)? 3  Help needed walking in hospital room? 3  Help needed climbing 3-5 steps with a railing?  3  6 Click Score 16  Mobility G Code  CK  PT Goal Progression  Progress towards PT goals Progressing toward goals  PT Time Calculation  PT Start Time (ACUTE ONLY) 1550  PT Stop Time (ACUTE ONLY) 1618  PT Time Calculation (min) (ACUTE ONLY) 28 min  PT General Charges  $$ ACUTE PT VISIT 1 Visit  PT Treatments  $Gait Training 8-22 mins  $Therapeutic Exercise 8-22 mins

## 2017-08-07 NOTE — Progress Notes (Signed)
Orthopedic Tech Progress Note Patient Details:  Christian [name] [facility] November 14, 1960 [id number]  CPM Left Knee CPM Left Knee: On Left Knee Flexion (Degrees): 90 Left Knee Extension (Degrees): 0 Additional Comments: new CPM  Post Interventions Patient Tolerated: Well Instructions Provided: Care of device  [name] 08/07/2017, 5:07 PM

## 2017-08-07 NOTE — Care Management Note (Addendum)
Case Management Note  Patient Details  Name: Fahd Galea MRN: [id number] Date of Birth: 05/29/1960  Subjective/Objective:  66 yr.old gentleman s/p left total Knee arthroplasty.                   Action/Plan: Case manager spoke with patient and his wife concerning discharge plan. Mrs. Henshaw says that the plan is for her husband to go to SNF for shortterm rehab, they want [facility]. Case manager informed her that Social worker would be notified of choice. PT has to evaluate patient. CM informed Education officer, museum of patient's choice. Patient was preoperatively setup with [facility] for [place], Vaz[place] notified Christa See, [facility] Liaison of patient's discharge plan.      Expected Discharge Date:    pending              Expected Discharge Plan:  [place]  In-House Referral:  Clinical Social Work  Discharge planning Services  CM Consult  Post Acute Care Choice:  NA Choice offered to:  Patient, Spouse  DME Arranged:  N/A DME Agency:  NA  HH Arranged:  NA HH Agency:  NA  Status of Service:  In process, will continue to follow  If discussed at Long Length of Stay Meetings, dates discussed:    Additional Comments:  Ninfa Meeker, RN 08/07/2017, 11:34 AM

## 2017-08-07 NOTE — Evaluation (Signed)
Physical Therapy Evaluation Patient Details Name: Christian [name] MRN: [id number] DOB: November 14, 1960 Today's Date: 08/07/2017   History of Present Illness  Pt is a 57 yo male s/p L TKA on 08/06/17. Hx of asthma, sleep apnea, COPD, HTN, DM, chronic low back pain, and dyspnea on exertion.  Clinical Impression  Pt is currently limited by fatigue secondary to lack of sleep and N/V. In spite of lethargy, pt was amicable to therapy and was able to walk roughly 30 ft in his room with RW and a chair follow. Pt was unable to provide further information about home life during this session due to fatigue and lethargy; this information will be updated at next therapy session. Pt will benefit from continued skilled PT to improve safety and current antalgic gait pattern, for L quadricep neuromuscular re-ed, and education on home HEP. Pt has been pre-approved for inpatient rehab and PT agrees with this d/c recommendation.     Follow Up Recommendations Follow surgeon's recommendation for DC plan and follow-up therapies    Equipment Recommendations  Rolling walker with 5" wheels;Cane    Recommendations for Other Services   NA    Precautions / Restrictions   Knee     Mobility  Bed Mobility                  Transfers Overall transfer level: Needs assistance Equipment used: Rolling walker (2 wheeled) Transfers: Sit to/from Stand Sit to Stand: Min assist;+2 safety/equipment;+2 physical assistance         General transfer comment: Pt vomitted contents of stomach up at beginning of session; Min A +1 for physical sit to stand at gait belt, Min A +1 at RW for safety and grabbing vomit bag if necessary.  Ambulation/Gait Ambulation/Gait assistance: +2 safety/equipment(Min A+1 for chair follow; Min A +1 at gait belt and IV pole) Ambulation Distance (Feet): 40 Feet Assistive device: Rolling walker (2 wheeled) Gait Pattern/deviations: Step-to pattern;Antalgic;Decreased step length - left;Decreased  stride length     General Gait Details: Pt was steady during ambulation and did not buckle or experience any loss of balance.          Balance Overall balance assessment: Needs assistance Sitting-balance support: Bilateral upper extremity supported;Feet supported Sitting balance-Leahy Scale: Good     Standing balance support: Bilateral upper extremity supported Standing balance-Leahy Scale: Fair                               Pertinent Vitals/Pain Pain Assessment: 0-10 Pain Score: 8  Pain Location: L knee Pain Descriptors / Indicators: Grimacing Pain Intervention(s): Limited activity within patient's tolerance;Monitored during session;Repositioned;Ice applied    Home Living Family/patient expects to be discharged to:: Private residence Living Arrangements: Spouse/significant other   Type of Home: House                Prior Function Level of Independence: Independent                  Extremity/Trunk Assessment   Upper Extremity Assessment Upper Extremity Assessment: Defer to OT evaluation    Lower Extremity Assessment Lower Extremity Assessment: Generalized weakness;Overall WFL for tasks assessed;LLE deficits/detail LLE Deficits / Details: grossly decreased AROM and strength    Cervical / Trunk Assessment Cervical / Trunk Assessment: Normal  Communication   Communication: No difficulties  Cognition Arousal/Alertness: Lethargic;Suspect due to medications Behavior During Therapy: [facility] for tasks assessed/performed Overall Cognitive Status: Within Functional Limits  for tasks assessed                                        General Comments General comments (skin integrity, edema, etc.): Pt currently experiencing frequent nausea and difficulty sleeping; fatigue may affect performance in therapy sessions    Exercises Total Joint Exercises Ankle Circles/Pumps: AROM;Both;20 reps Quad Sets: Other (comment)(Pt instructed in  quad sets but too tired to perform) Heel Slides: Other (comment)(Pt instructed in heel slide but too tired to perform)   Assessment/Plan    PT Assessment Patient needs continued PT services  PT Problem List Decreased strength;Decreased range of motion;Decreased activity tolerance;Decreased balance;Decreased mobility;Decreased coordination;Pain       PT Treatment Interventions DME instruction;Gait training;Functional mobility training;Stair training;Therapeutic activities;Therapeutic exercise;Balance training;Neuromuscular re-education;Patient/family education    PT Goals (Current goals can be found in the Care Plan section)  Acute Rehab PT Goals Patient Stated Goal: to go to rehab PT Goal Formulation: With patient/family Time For Goal Achievement: 08/21/17 Potential to Achieve Goals: Good    Frequency 7X/week           AM-PAC PT "6 Clicks" Daily Activity  Outcome Measure Difficulty turning over in bed (including adjusting bedclothes, sheets and blankets)?: Unable Difficulty moving from lying on back to sitting on the side of the bed? : Unable Difficulty sitting down on and standing up from a chair with arms (e.g., wheelchair, bedside commode, etc,.)?: Unable Help needed moving to and from a bed to chair (including a wheelchair)?: A Little Help needed walking in hospital room?: A Little Help needed climbing 3-5 steps with a railing? : A Lot 6 Click Score: 11    End of Session Equipment Utilized During Treatment: Gait belt Activity Tolerance: Patient tolerated treatment well;Patient limited by fatigue;Patient limited by lethargy Patient left: in chair;with call bell/phone within reach;with chair alarm set;with family/visitor present Nurse Communication: Other (comment)(Nurse informed of pt vomiting up entire contents of stomach) PT Visit Diagnosis: Muscle weakness (generalized) (M62.81);Difficulty in walking, not elsewhere classified (R26.2);Pain;Unsteadiness on feet  (R26.81) Pain - Right/Left: Left Pain - part of body: Knee    Time: 0350-0938 PT Time Calculation (min) (ACUTE ONLY): 38 min   Charges:           Wells Guiles B. Feliza Diven, PT, DPT [phone]   PT Evaluation $PT Eval Moderate Complexity: 1 Mod PT Treatments $Gait Training: 8-22 mins $Therapeutic Activity: 8-22 mins   08/07/2017, 1:01 PM

## 2017-08-07 NOTE — Progress Notes (Signed)
   08/07/17 1100  Clinical Encounter Type  Visited With Patient and family together  Visit Type Initial  Referral From Nurse  Consult/Referral To Chaplain  Spiritual Encounters  Spiritual Needs Prayer;Emotional  Stress Factors  Patient Stress Factors None identified  Family Stress Factors None identified    PT's family had several tragic events like suicide, financial crisis, etc. They asked to pray for family and his health.  I pray for family and him. They are strong in faith.   Chaplain Intern Hollie Beach

## 2017-08-07 NOTE — Progress Notes (Signed)
Subjective: 1 Day Post-Op Procedure(s) (LRB): LEFT TOTAL KNEE ARTHROPLASTY (Left) Patient reports pain as mild.  Noticed hematuria last night which appeared to have resolved until two episodes this am.  No hx of prostate, bladder, kidney or GI issues.  Recent colonoscopy normal.  Patient did have an in/out cath post-op in OR yesterday.  He also reports nausea and vomiting earlier this am.  No lightheadedness/dizziness.  Objective: Vital signs in last 24 hours: Temp:  [97.5 F (36.4 C)-98.3 F (36.8 C)] 98.3 F (36.8 C) (04/12 0441) Pulse Rate:  [61-76] 70 (04/12 0441) Resp:  [11-18] 16 (04/12 0441) BP: (119-144)/(62-91) 119/80 (04/12 0441) SpO2:  [94 %-100 %] 96 % (04/12 0441) Weight:  [173 lb (78.5 kg)] 173 lb (78.5 kg) (04/11 1300)  Intake/Output from previous day: 04/11 0701 - 04/12 0700 In: 1687.5 [I.V.:1587.5; IV Piggyback:100] Out: 950 [Urine:850; Blood:100] Intake/Output this shift: No intake/output data recorded.  Recent Labs    08/04/17 1211  HGB 15.2   Recent Labs    08/04/17 1211  WBC 4.2  RBC 5.17  HCT 45.9  PLT 243   Recent Labs    08/04/17 1211  NA 141  K 4.0  CL 106  CO2 26  BUN 14  CREATININE 1.26*  GLUCOSE 102*  CALCIUM 9.0   Recent Labs    08/04/17 1211  INR 1.00    Neurologically intact Neurovascular intact Sensation intact distally Intact pulses distally Dorsiflexion/Plantar flexion intact Incision: dressing C/D/I No cellulitis present Compartment soft  Anticipated LOS equal to or greater than 2 midnights due to - Age 35 and older with one or more of the following:  - Obesity  - Expected need for hospital services (PT, OT, Nursing) required for safe  discharge  - Anticipated need for postoperative skilled nursing care or inpatient rehab  - Active co-morbidities: Diabetes and Respiratory Failure/COPD OR   - Unanticipated findings during/Post Surgery: Slow post-op progression: GI, pain control, mobility  - Patient is a high  risk of re-admission due to: Barriers to post-acute care (logistical, no family support in home)   Assessment/Plan: 1 Day Post-Op Procedure(s) (LRB): LEFT TOTAL KNEE ARTHROPLASTY (Left) Advance diet Up with therapy  WBAT LLE Will monitor hematuria-likely from traumatic in/out cath post-op in the OR Will plan for dressing change tomorrow with aquacel and application of Cavon hose     Aundra Dubin 08/07/2017, 7:57 AM

## 2017-08-08 LAB — GLUCOSE, CAPILLARY
GLUCOSE-CAPILLARY: 93 mg/dL (ref 65–99)
GLUCOSE-CAPILLARY: 114 mg/dL — AB (ref 65–99)
Glucose-Capillary: 104 mg/dL — ABNORMAL HIGH (ref 65–99)
Glucose-Capillary: 122 mg/dL — ABNORMAL HIGH (ref 65–99)

## 2017-08-08 NOTE — Plan of Care (Signed)
  Problem: Education: Goal: Knowledge of General Education information will improve Outcome: Progressing   Problem: Clinical Measurements: Goal: Ability to maintain clinical measurements within normal limits will improve Outcome: Progressing   Problem: Pain Management: Goal: Pain level will decrease with appropriate interventions Outcome: Progressing   

## 2017-08-08 NOTE — Progress Notes (Signed)
Patient ID: Christian [name], male   DOB: [birth year], 57 y.o.   MRN: [id number] Patient is status post total knee arthroplasty.  Patient has no complaints this morning.  Anticipate discharge to skilled nursing on Monday.Marland Kitchen

## 2017-08-08 NOTE — Progress Notes (Signed)
Physical Therapy Treatment Patient Details Name: Christian [name] MRN: [id number] DOB: [birth year] Today's Date: 08/08/2017    History of Present Illness Pt is a 57 yo male s/p L TKA on 08/06/17. Hx of asthma, sleep apnea, COPD, HTN, DM, chronic low back pain, and dyspnea on exertion.    PT Comments    Pt demonstrated improved gait quality and increased ambulation distance during today's session. Pt's gait is very slow and guarded but overall steady with RW. He required min A for bed mobilities and transfers. Will continue to follow acutely to maximize pt's functional independence and safety with mobility.    Follow Up Recommendations  Follow surgeon's recommendation for DC plan and follow-up therapies     Equipment Recommendations  Rolling walker with 5" wheels    Recommendations for Other Services       Precautions / Restrictions Precautions Precautions: Knee Precaution Booklet Issued: Yes (comment) Precaution Comments: Supine HEP Reviewed Restrictions Weight Bearing Restrictions: Yes LLE Weight Bearing: Weight bearing as tolerated    Mobility  Bed Mobility Overal bed mobility: Needs Assistance Bed Mobility: Supine to Sit     Supine to sit: Min assist     General bed mobility comments: min A for L LE management  Transfers Overall transfer level: Needs assistance Equipment used: Rolling walker (2 wheeled) Transfers: Sit to/from Stand Sit to Stand: Min assist         General transfer comment: min A to rise from EOB to RW. Cues for hand placement and forward weight shift.  Ambulation/Gait Ambulation/Gait assistance: Min guard Ambulation Distance (Feet): 120 Feet Assistive device: Rolling walker (2 wheeled) Gait Pattern/deviations: Antalgic;Decreased stride length;Trunk flexed;Step-through pattern Gait velocity: significantly decreased Gait velocity interpretation: <1.31 ft/sec, indicative of household ambulator General Gait Details: Pt with slow guarded gait.  Mildly antalgic but overall steady with RW. Cues for postureal control.   Stairs             Wheelchair Mobility    Modified Rankin (Stroke Patients Only)       Balance Overall balance assessment: Needs assistance Sitting-balance support: Feet supported;No upper extremity supported Sitting balance-Leahy Scale: Good     Standing balance support: Bilateral upper extremity supported Standing balance-Leahy Scale: Poor Standing balance comment: Reliant on UE support                            Cognition Arousal/Alertness: Awake/alert Behavior During Therapy: WFL for tasks assessed/performed Overall Cognitive Status: Within Functional Limits for tasks assessed                                        Exercises Total Joint Exercises Short Arc Quad: AROM;Left;10 reps;Supine Hip ABduction/ADduction: AROM;Left;10 reps;Supine Straight Leg Raises: AROM;Left;10 reps;Supine    General Comments        Pertinent Vitals/Pain Pain Assessment: Faces Faces Pain Scale: Hurts little more Pain Location: L knee Pain Descriptors / Indicators: Grimacing Pain Intervention(s): Monitored during session;Limited activity within patient's tolerance;Repositioned    Home Living                      Prior Function            PT Goals (current goals can now be found in the care plan section) Acute Rehab PT Goals Patient Stated Goal: to go to rehab PT Goal Formulation:  With patient/family Time For Goal Achievement: 08/21/17 Potential to Achieve Goals: Good Progress towards PT goals: Progressing toward goals    Frequency    7X/week      PT Plan Current plan remains appropriate    Co-evaluation              AM-PAC PT "6 Clicks" Daily Activity  Outcome Measure  Difficulty turning over in bed (including adjusting bedclothes, sheets and blankets)?: A Little Difficulty moving from lying on back to sitting on the side of the bed? : A  Little Difficulty sitting down on and standing up from a chair with arms (e.g., wheelchair, bedside commode, etc,.)?: Unable Help needed moving to and from a bed to chair (including a wheelchair)?: A Little Help needed walking in hospital room?: A Little Help needed climbing 3-5 steps with a railing? : A Little 6 Click Score: 16    End of Session Equipment Utilized During Treatment: Gait belt Activity Tolerance: Patient tolerated treatment well Patient left: with call bell/phone within reach;in chair Nurse Communication: Mobility status PT Visit Diagnosis: Muscle weakness (generalized) (M62.81);Difficulty in walking, not elsewhere classified (R26.2);Pain;Unsteadiness on feet (R26.81) Pain - Right/Left: Left Pain - part of body: Knee     Time: 0962-8366 PT Time Calculation (min) (ACUTE ONLY): 31 min  Charges:  $Gait Training: 8-22 mins $Therapeutic Exercise: 8-22 mins                    G Codes:      Benjiman Core, [place] Pager [phone] Acute Rehab   Allena Katz 08/08/2017, 12:02 PM

## 2017-08-09 LAB — GLUCOSE, CAPILLARY
GLUCOSE-CAPILLARY: 143 mg/dL — AB (ref 65–99)
GLUCOSE-CAPILLARY: 117 mg/dL — AB (ref 65–99)
Glucose-Capillary: 149 mg/dL — ABNORMAL HIGH (ref 65–99)
Glucose-Capillary: 134 mg/dL — ABNORMAL HIGH (ref 65–99)

## 2017-08-09 NOTE — Progress Notes (Signed)
Physical Therapy Treatment Patient Details Name: Christian [name] MRN: [id number] DOB: Sep 25, 1960 Today's Date: 08/09/2017    History of Present Illness Pt is a 57 yo male s/p L TKA on 08/06/17. Hx of asthma, sleep apnea, COPD, HTN, DM, chronic low back pain, and dyspnea on exertion.    PT Comments    Pt demonstrates good tolerance for gait this session. Pt continues to have decreased gait speed, but decreased c/o pain with mobility. Pt with improved ability to perform sit to stand activities with decreased assistance. Pt is making good progress with skilled therapy.     Follow Up Recommendations  Follow surgeon's recommendation for DC plan and follow-up therapies     Equipment Recommendations  Rolling walker with 5" wheels    Recommendations for Other Services       Precautions / Restrictions Precautions Precautions: Knee Precaution Booklet Issued: Yes (comment) Precaution Comments: Supine HEP Reviewed Restrictions Weight Bearing Restrictions: Yes LLE Weight Bearing: Weight bearing as tolerated    Mobility  Bed Mobility Overal bed mobility: Needs Assistance Bed Mobility: Supine to Sit     Supine to sit: Min assist     General bed mobility comments: min A for L LE management  Transfers Overall transfer level: Needs assistance Equipment used: Rolling walker (2 wheeled) Transfers: Sit to/from Stand Sit to Stand: Min assist         General transfer comment: min A to rise from EOB to RW. Cues for hand placement and forward weight shift.  Ambulation/Gait Ambulation/Gait assistance: Min guard Ambulation Distance (Feet): 50 Feet Assistive device: Rolling walker (2 wheeled) Gait Pattern/deviations: Antalgic;Decreased stride length;Trunk flexed;Step-through pattern Gait velocity: significantly decreased   General Gait Details: Pt with slow guarded gait. Mildly antalgic but overall steady with RW. Cues for postural control.   Stairs             Wheelchair  Mobility    Modified Rankin (Stroke Patients Only)       Balance Overall balance assessment: Needs assistance Sitting-balance support: Feet supported;No upper extremity supported Sitting balance-Leahy Scale: Good     Standing balance support: Bilateral upper extremity supported Standing balance-Leahy Scale: Poor Standing balance comment: Reliant on UE support                            Cognition Arousal/Alertness: Awake/alert Behavior During Therapy: WFL for tasks assessed/performed Overall Cognitive Status: Within Functional Limits for tasks assessed                                        Exercises Total Joint Exercises Ankle Circles/Pumps: AROM;Both;20 reps Quad Sets: AROM;Left;10 reps Heel Slides: AAROM;Left;10 reps    General Comments        Pertinent Vitals/Pain Pain Assessment: 0-10 Pain Score: 6  Pain Location: L knee Pain Descriptors / Indicators: Grimacing Pain Intervention(s): Monitored during session;Premedicated before session;Repositioned;Ice applied    Home Living                      Prior Function            PT Goals (current goals can now be found in the care plan section) Acute Rehab PT Goals Patient Stated Goal: to go to rehab Progress towards PT goals: Progressing toward goals    Frequency    7X/week  PT Plan Current plan remains appropriate    Co-evaluation              AM-PAC PT "6 Clicks" Daily Activity  Outcome Measure  Difficulty turning over in bed (including adjusting bedclothes, sheets and blankets)?: A Little Difficulty moving from lying on back to sitting on the side of the bed? : Unable Difficulty sitting down on and standing up from a chair with arms (e.g., wheelchair, bedside commode, etc,.)?: Unable Help needed moving to and from a bed to chair (including a wheelchair)?: A Little Help needed walking in hospital room?: A Little Help needed climbing 3-5 steps with a  railing? : A Little 6 Click Score: 14    End of Session Equipment Utilized During Treatment: Gait belt Activity Tolerance: Patient tolerated treatment well Patient left: in chair;with call bell/phone within reach Nurse Communication: Mobility status PT Visit Diagnosis: Muscle weakness (generalized) (M62.81);Difficulty in walking, not elsewhere classified (R26.2);Pain;Unsteadiness on feet (R26.81) Pain - Right/Left: Left Pain - part of body: Knee     Time: 1223-1259 PT Time Calculation (min) (ACUTE ONLY): 36 min  Charges:  $Gait Training: 23-37 mins                    G Codes:       Scheryl Marten PT, DPT     Loren Vicens Sloan Leiter 08/09/2017, 2:15 PM

## 2017-08-09 NOTE — Progress Notes (Signed)
Patient ID: Christian [name], male   DOB: Jan 24, 1961, 57 y.o.   MRN: [id number] Patient is status post total knee replacement.  He has no complaints this morning.  Anticipate discharge to skilled nursing on Monday or Tuesday.

## 2017-08-10 ENCOUNTER — Encounter (HOSPITAL_COMMUNITY): Payer: Self-pay | Admitting: Orthopaedic Surgery

## 2017-08-10 DIAGNOSIS — M25562 Pain in left knee: Secondary | ICD-10-CM | POA: Diagnosis not present

## 2017-08-10 DIAGNOSIS — G4733 Obstructive sleep apnea (adult) (pediatric): Secondary | ICD-10-CM | POA: Diagnosis not present

## 2017-08-10 DIAGNOSIS — M6281 Muscle weakness (generalized): Secondary | ICD-10-CM | POA: Diagnosis not present

## 2017-08-10 DIAGNOSIS — M545 Low back pain: Secondary | ICD-10-CM | POA: Diagnosis not present

## 2017-08-10 DIAGNOSIS — R6889 Other general symptoms and signs: Secondary | ICD-10-CM | POA: Diagnosis not present

## 2017-08-10 DIAGNOSIS — E785 Hyperlipidemia, unspecified: Secondary | ICD-10-CM | POA: Diagnosis not present

## 2017-08-10 DIAGNOSIS — M13862 Other specified arthritis, left knee: Secondary | ICD-10-CM | POA: Diagnosis not present

## 2017-08-10 DIAGNOSIS — J302 Other seasonal allergic rhinitis: Secondary | ICD-10-CM | POA: Diagnosis not present

## 2017-08-10 DIAGNOSIS — G629 Polyneuropathy, unspecified: Secondary | ICD-10-CM | POA: Diagnosis not present

## 2017-08-10 DIAGNOSIS — Z96652 Presence of left artificial knee joint: Secondary | ICD-10-CM | POA: Diagnosis not present

## 2017-08-10 DIAGNOSIS — I10 Essential (primary) hypertension: Secondary | ICD-10-CM | POA: Diagnosis not present

## 2017-08-10 DIAGNOSIS — J45998 Other asthma: Secondary | ICD-10-CM | POA: Diagnosis not present

## 2017-08-10 DIAGNOSIS — E1140 Type 2 diabetes mellitus with diabetic neuropathy, unspecified: Secondary | ICD-10-CM | POA: Diagnosis not present

## 2017-08-10 DIAGNOSIS — J449 Chronic obstructive pulmonary disease, unspecified: Secondary | ICD-10-CM | POA: Diagnosis not present

## 2017-08-10 DIAGNOSIS — S8990XA Unspecified injury of unspecified lower leg, initial encounter: Secondary | ICD-10-CM | POA: Diagnosis not present

## 2017-08-10 DIAGNOSIS — G8911 Acute pain due to trauma: Secondary | ICD-10-CM | POA: Diagnosis not present

## 2017-08-10 DIAGNOSIS — R262 Difficulty in walking, not elsewhere classified: Secondary | ICD-10-CM | POA: Diagnosis not present

## 2017-08-10 LAB — GLUCOSE, CAPILLARY
GLUCOSE-CAPILLARY: 91 mg/dL (ref 65–99)
Glucose-Capillary: 131 mg/dL — ABNORMAL HIGH (ref 65–99)

## 2017-08-10 NOTE — Clinical Social Work Note (Signed)
Clinical Social Work Assessment  Patient Details  Name: Christian [name] MRN: [id number] Date of Birth: 07-31-1960  Date of referral:  08/10/17               Reason for consult:  Facility Placement                Permission sought to share information with:  Chartered certified accountant granted to share information::  Yes, Verbal Permission Granted  Name::     Derrian Poli  Agency::  SNF  Relationship::  spouse  Contact Information:     Housing/Transportation Living arrangements for the past 2 months:  [place] of Information:  Patient Patient Interpreter Needed:  None Criminal Activity/Legal Involvement Pertinent to Current Situation/Hospitalization:  No - Comment as needed Significant Relationships:  Adult Children, Spouse, Other Family Members Lives with:  Spouse Do you feel safe going back to the place where you live?  No Need for family participation in patient care:  Yes (Comment)  Care giving concerns:  Pt with new impairment and will need skilled nursing at discharge.  Social Worker assessment / plan:  CSW met with patient at bedside. CSW discussed SNF options and placement. Pt agreeable to SNF and indicated that he visited [facility] and desires to go there before heading home. CSW obtained permission to send to [facility]. CSW explained insurance Auth process. CSW will f/u for disposition.  Employment status:  Disabled (Comment on whether or not currently receiving Disability) Insurance information:  Managed Medicare PT Recommendations:  [place] / Referral to community resources:  [place]  Patient/Family's Response to care:  Pt thanked CSW for meeting to discuss SNF placement and options. No issues as patient agreeable to SNF and has prearranged.  Patient/Family's Understanding of and Emotional Response to Diagnosis, Current Treatment, and Prognosis:  Pt has good understanding of impairment and will go to  short term rehab at discharge. Pt has prearranged to go to SNF and has good understanding of his limitations. Pt hopeful to return home and has supportive family. CSW will f/u for disposition once Insurance Auth received.  Emotional Assessment Appearance:  Appears stated age Attitude/Demeanor/Rapport:  (Cooperative) Affect (typically observed):  Accepting, Appropriate Orientation:  Oriented to Situation, Oriented to  Time, Oriented to Place, Oriented to Self Alcohol / Substance use:  Not Applicable Psych involvement (Current and /or in the community):  No (Comment)  Discharge Needs  Concerns to be addressed:  Discharge Planning Concerns Readmission within the last 30 days:  No Current discharge risk:  None Barriers to Discharge:  No Barriers Identified   Normajean Baxter, LCSW 08/10/2017, 11:29 AM

## 2017-08-10 NOTE — Discharge Summary (Signed)
Patient ID: Christian [name] MRN: [id number] DOB/AGE: 09/11/1960 57 y.o.  Admit date: 08/06/2017 Discharge date: 08/10/2017  Admission Diagnoses:  Active Problems:   Total knee replacement status   Discharge Diagnoses:  Same  Past Medical History:  Diagnosis Date  . Allergy   . Arthritis    "left knee" (01/22/2016)  . Asthma   . Chronic lower back pain   . COPD (chronic obstructive pulmonary disease) ([place])   . Dyspnea    with extertion  . High cholesterol   . Hypertension   . Neuropathy   . OSA on CPAP   . Secondhand smoke exposure   . Sleep apnea    wears CPAP, not wearing now  for months  . Type II diabetes mellitus ([place]) dx'd 01/17/2016    Surgeries: Procedure(s): LEFT TOTAL KNEE ARTHROPLASTY on 08/06/2017   Consultants:   Discharged Condition: Improved  Hospital Course: Christian [name] is an 57 y.o. male who was admitted 08/06/2017 for operative treatment of<principal problem not specified>. Patient has severe unremitting pain that affects sleep, daily activities, and work/hobbies. After pre-op clearance the patient was taken to the operating room on 08/06/2017 and underwent  Procedure(s): LEFT TOTAL KNEE ARTHROPLASTY.    Patient was given perioperative antibiotics:  Anti-infectives (From admission, onward)   Start     Dose/Rate Route Frequency Ordered Stop   08/06/17 2100  ceFAZolin (ANCEF) IVPB 2g/100 mL premix     2 g 200 mL/hr over 30 Minutes Intravenous Every 6 hours 08/06/17 1831 08/07/17 0915   08/06/17 1547  vancomycin (VANCOCIN) powder  Status:  Discontinued       As needed 08/06/17 1548 08/06/17 1721   08/06/17 1300  ceFAZolin (ANCEF) IVPB 2g/100 mL premix     2 g 200 mL/hr over 30 Minutes Intravenous To ShortStay Surgical 08/05/17 1239 08/06/17 1504   08/06/17 1243  ceFAZolin (ANCEF) 2-4 GM/100ML-% IVPB    Note to Pharmacy:  Jasmine Pang   : cabinet override      08/06/17 1243 08/06/17 1504       Patient was given sequential compression  devices, early ambulation, and chemoprophylaxis to prevent DVT.  Patient benefited maximally from hospital stay and there were no complications.    Recent vital signs:  Patient Vitals for the past 24 hrs:  BP Temp Temp src Pulse Resp SpO2  08/10/17 0533 123/84 98.1 F (36.7 C) Oral 85 - 90 %  08/09/17 2043 122/78 98.4 F (36.9 C) Oral 88 16 95 %  08/09/17 1547 139/72 98.5 F (36.9 C) Oral 91 18 99 %  08/09/17 1100 (!) 142/77 98.2 F (36.8 C) Oral 88 18 99 %     Recent laboratory studies:  Recent Labs    08/07/17 0803  WBC 8.0  HGB 13.6  HCT 40.9  PLT 180  NA 136  K 3.5  CL 103  CO2 25  BUN 9  CREATININE 1.00  GLUCOSE 162*  CALCIUM 8.3*     Discharge Medications:   Allergies as of 08/10/2017      Reactions   Phenergan [promethazine Hcl] Itching, Other (See Comments)   burning      Medication List    STOP taking these medications   acetaminophen 500 MG tablet Commonly known as:  TYLENOL   meloxicam 15 MG tablet Commonly known as:  MOBIC     TAKE these medications   albuterol (5 MG/ML) 0.5% nebulizer solution Commonly known as:  PROVENTIL Take 2.5 mg by nebulization every 6 (  six) hours as needed for wheezing or shortness of breath. What changed:  Another medication with the same name was changed. Make sure you understand how and when to take each.   albuterol 108 (90 Base) MCG/ACT inhaler Commonly known as:  PROAIR HFA Inhale 1-2 puffs into the lungs every 6 (six) hours as needed for wheezing or shortness of breath. What changed:    how much to take  when to take this   amLODipine 10 MG tablet Commonly known as:  NORVASC Take 1 tablet (10 mg total) by mouth daily.   aspirin EC 325 MG tablet Take 1 tablet (325 mg total) by mouth 2 (two) times daily. What changed:    medication strength  how much to take  when to take this   budesonide-formoterol 80-4.5 MCG/ACT inhaler Commonly known as:  SYMBICORT Inhale 2 puffs into the lungs 2 (two)  times daily.   diclofenac sodium 1 % Gel Commonly known as:  VOLTAREN Apply 2 g 4 (four) times daily topically. What changed:    when to take this  reasons to take this   fluticasone 50 MCG/ACT nasal spray Commonly known as:  FLONASE Place 2 sprays into both nostrils daily.   gabapentin 300 MG capsule Commonly known as:  NEURONTIN Take 1 capsule (300 mg total) by mouth 4 (four) times daily. What changed:    when to take this  additional instructions   glucose blood test strip Commonly known as:  TRUE METRIX BLOOD GLUCOSE TEST Check Blood sugar 1-2 x a day.  Has Prodigy Auto code meter   ipratropium-albuterol 0.5-2.5 (3) MG/3ML Soln Commonly known as:  DUONEB Take 3 mLs by nebulization every 4 (four) hours as needed.   losartan-hydrochlorothiazide 50-12.5 MG tablet Commonly known as:  HYZAAR Take 1 tablet by mouth daily.   metFORMIN 500 MG 24 hr tablet Commonly known as:  GLUCOPHAGE XR Take 1 tablet (500 mg total) by mouth daily with breakfast.   methocarbamol 750 MG tablet Commonly known as:  ROBAXIN Take 1 tablet (750 mg total) by mouth 2 (two) times daily as needed for muscle spasms.   montelukast 10 MG tablet Commonly known as:  SINGULAIR Take 1 tablet (10 mg total) by mouth at bedtime.   ondansetron 4 MG tablet Commonly known as:  ZOFRAN Take 1-2 tablets (4-8 mg total) by mouth every 8 (eight) hours as needed for nausea or vomiting.   ONE TOUCH ULTRA 2 w/Device Kit Use as directed 3 times daily E11.9   Blood Glucose Monitoring Suppl Supplies Misc Prodigy Autocode test stripes - check blood sugar once daily   oxyCODONE-acetaminophen 5-325 MG tablet Commonly known as:  PERCOCET Take 1-2 tablets by mouth every 4 (four) hours as needed for severe pain.   pravastatin 20 MG tablet Commonly known as:  PRAVACHOL Take 1 tablet (20 mg total) by mouth daily. What changed:  when to take this   predniSONE 10 MG tablet Commonly known as:   DELTASONE 6,5,4,3,2,1 take each days dose in morning with food   senna-docusate 8.6-50 MG tablet Commonly known as:  SENOKOT S Take 1 tablet by mouth at bedtime as needed.   TRUEPLUS LANCETS 28G Misc Check glucose 2 times a day            Durable Medical Equipment  (From admission, onward)        Start     Ordered   08/06/17 1832  DME Walker rolling  Once    Question:  Patient  needs a walker to treat with the following condition  Answer:  Total knee replacement status   08/06/17 1831   08/06/17 1832  DME 3 n 1  Once     08/06/17 1831   08/06/17 1832  DME Bedside commode  Once    Question:  Patient needs a bedside commode to treat with the following condition  Answer:  Total knee replacement status   08/06/17 1831      Diagnostic Studies: Dg Knee Left Port  Result Date: 08/06/2017 CLINICAL DATA:  Postop left knee arthroplasty EXAM: PORTABLE LEFT KNEE - 1-2 VIEW COMPARISON:  Preop study from 03/03/2017 FINDINGS: Subcutaneous and intra-articular emphysema status post left total knee arthroplasty with patellar button in place. No hardware failure or loosening. No immediate postoperative complications. IMPRESSION: Intact new left total knee arthroplasty with postop soft tissue change. Electronically Signed   By: Ashley Royalty M.D.   On: 08/06/2017 22:13    Disposition: Discharge disposition: 03-Skilled [facility]    Leandrew Koyanagi, MD In 2 weeks.   Specialty:  Orthopedic Surgery Why:  For suture removal, For wound re-check Contact information: [address] [place] [id number] [phone]            Signed: Aundra Dubin 08/10/2017, 7:59 AM

## 2017-08-10 NOTE — Progress Notes (Signed)
Subjective: 4 Days Post-Op Procedure(s) (LRB): LEFT TOTAL KNEE ARTHROPLASTY (Left) Patient reports pain as mild.  Doing well this am.  Hematuria resolved completely this past friday  Objective: Vital signs in last 24 hours: Temp:  [98.1 F (36.7 C)-98.5 F (36.9 C)] 98.1 F (36.7 C) (04/15 0533) Pulse Rate:  [85-91] 85 (04/15 0533) Resp:  [16-18] 16 (04/14 2043) BP: (122-142)/(72-84) 123/84 (04/15 0533) SpO2:  [90 %-99 %] 90 % (04/15 0533)  Intake/Output from previous day: 04/14 0701 - 04/15 0700 In: 520 [P.O.:520] Out: 700 [Urine:700] Intake/Output this shift: No intake/output data recorded.  Recent Labs    08/07/17 0803  HGB 13.6   Recent Labs    08/07/17 0803  WBC 8.0  RBC 4.61  HCT 40.9  PLT 180   Recent Labs    08/07/17 0803  NA 136  K 3.5  CL 103  CO2 25  BUN 9  CREATININE 1.00  GLUCOSE 162*  CALCIUM 8.3*   No results for input(s): LABPT, INR in the last 72 hours.  Neurologically intact Neurovascular intact Sensation intact distally Intact pulses distally Dorsiflexion/Plantar flexion intact Incision: dressing C/D/I No cellulitis present Compartment soft  Anticipated LOS equal to or greater than 2 midnights due to - Age 19 and older with one or more of the following:  - Obesity  - Expected need for hospital services (PT, OT, Nursing) required for safe  discharge  - Anticipated need for postoperative skilled nursing care or inpatient rehab  - Active co-morbidities: Diabetes and Respiratory Failure/COPD OR   - Unanticipated findings during/Post Surgery: Slow post-op progression: GI, pain control, mobility  - Patient is a high risk of re-admission due to: Barriers to post-acute care (logistical, no family support in home)   Assessment/Plan: 4 Days Post-Op Procedure(s) (LRB): LEFT TOTAL KNEE ARTHROPLASTY (Left) Advance diet Up with therapy Discharge to SNF today WBAT LLE Please apply [place] hose to operative extremity prior to  d/c    Aundra Dubin 08/10/2017, 7:56 AM

## 2017-08-10 NOTE — NC FL2 (Signed)
[place] MEDICAID FL2 LEVEL OF CARE SCREENING TOOL     IDENTIFICATION  Patient Name: Christian [name] Birthdate: 08/15/1960 Sex: male Admission Date (Current Location): 08/06/2017  [facility] and [place] Number:  Herbalist and Address:  [facility], [place] [address] [place], [place] [address]      Provider Number: [id number]  Attending Physician Name and Address:  Leandrew Koyanagi, MD  Relative Name and Phone Number:  Inioluwa Baris, spouse, [phone]    Current Level of Care: Hospital Recommended Level of Care: [place] Prior Approval Number:    Date Approved/Denied:   PASRR Number: [id number]  Discharge Plan: SNF    Current Diagnoses: Patient Active Problem List   Diagnosis Date Noted  . Total knee replacement status 08/06/2017  . Moderate persistent asthma without complication 50/12/3816  . Primary osteoarthritis of left knee 02/23/2017  . Chronic pain syndrome 04/02/2016  . Intrinsic asthma 03/25/2016  . Essential hypertension 01/23/2016  . Hyperlipidemia 01/23/2016  . Secondhand smoke exposure 01/23/2016  . Diabetes mellitus with neuropathy ([place]) 01/23/2016  . Musculoskeletal chest pain 01/22/2016    Orientation RESPIRATION BLADDER Height & Weight     Self, Time, Situation, Place  Normal Continent Weight: 173 lb (78.5 kg) Height:  5\' 10"  (177.8 cm)  BEHAVIORAL SYMPTOMS/MOOD NEUROLOGICAL BOWEL NUTRITION STATUS      Continent Diet  AMBULATORY STATUS COMMUNICATION OF NEEDS Skin   Limited Assist Verbally Surgical wounds                       Personal Care Assistance Level of Assistance  Dressing, Feeding, Bathing Bathing Assistance: Limited assistance Feeding assistance: Limited assistance Dressing Assistance: Limited assistance     Functional Limitations Info  Sight, Hearing, Speech Sight Info: Adequate Hearing Info: Adequate Speech Info: Adequate    SPECIAL CARE FACTORS FREQUENCY  PT (By  licensed PT), OT (By licensed OT)     PT Frequency: 5x week OT Frequency: 5x week            Contractures      Additional Factors Info  Code Status, Allergies, Insulin Sliding Scale Code Status Info: Full Allergies Info: PHENERGAN PROMETHAZINE HCL    Insulin Sliding Scale Info: Insulin Daily       Current Medications (08/10/2017):  This is the current hospital active medication list Current Facility-Administered Medications  Medication Dose Route Frequency Provider Last Rate Last Dose  . 0.9 %  sodium chloride infusion   Intravenous Continuous Leandrew Koyanagi, MD   Stopped at 08/08/17 [phone]  . acetaminophen (TYLENOL) tablet 325-650 mg  325-650 mg Oral Q6H PRN Leandrew Koyanagi, MD      . albuterol (PROVENTIL) (2.5 MG/3ML) 0.083% nebulizer solution 2.5 mg  2.5 mg Nebulization Q6H PRN Leandrew Koyanagi, MD      . alum & mag hydroxide-simeth (MAALOX/MYLANTA) 200-200-20 MG/5ML suspension 30 mL  30 mL Oral Q4H PRN Leandrew Koyanagi, MD      . amLODipine (NORVASC) tablet 10 mg  10 mg Oral Daily Leandrew Koyanagi, MD   10 mg at 08/10/17 1045  . aspirin EC tablet 325 mg  325 mg Oral BID Leandrew Koyanagi, MD   325 mg at 08/10/17 7169  . diphenhydrAMINE (BENADRYL) 12.5 MG/5ML elixir 25 mg  25 mg Oral Q4H PRN Leandrew Koyanagi, MD      . docusate sodium (COLACE) capsule 100 mg  100 mg Oral BID Erlinda Hong, Naip[place]  M, MD   100 mg at 08/10/17 1045  . fluticasone (FLONASE) 50 MCG/ACT nasal spray 2 spray  2 spray Each Nare Daily Leandrew Koyanagi, MD   2 spray at 08/09/17 0941  . gabapentin (NEURONTIN) capsule 300 mg  300 mg Oral QID Leandrew Koyanagi, MD   300 mg at 08/10/17 1045  . losartan (COZAAR) tablet 50 mg  50 mg Oral Daily Leandrew Koyanagi, MD   50 mg at 08/10/17 1045   And  . hydrochlorothiazide (MICROZIDE) capsule 12.5 mg  12.5 mg Oral Daily Leandrew Koyanagi, MD   12.5 mg at 08/10/17 1045  . HYDROmorphone (DILAUDID) injection 0.5-1 mg  0.5-1 mg Intravenous Q4H PRN Leandrew Koyanagi, MD   1 mg at 08/08/17 0521  . insulin aspart  (novoLOG) injection 0-15 Units  0-15 Units Subcutaneous TID WC Leandrew Koyanagi, MD   2 Units at 08/10/17 [phone]  . insulin aspart (novoLOG) injection 0-5 Units  0-5 Units Subcutaneous QHS Leandrew Koyanagi, MD      . ipratropium-albuterol (DUONEB) 0.5-2.5 (3) MG/3ML nebulizer solution 3 mL  3 mL Nebulization Q4H PRN Leandrew Koyanagi, MD      . lactated ringers infusion   Intravenous Continuous Leandrew Koyanagi, MD   Stopped at 08/08/17 [phone]  . magnesium citrate solution 1 Bottle  1 Bottle Oral Once PRN Leandrew Koyanagi, MD      . menthol-cetylpyridinium (CEPACOL) lozenge 3 mg  1 lozenge Oral PRN Leandrew Koyanagi, MD       Or  . phenol (CHLORASEPTIC) mouth spray 1 spray  1 spray Mouth/Throat PRN Leandrew Koyanagi, MD      . methocarbamol (ROBAXIN) tablet 500 mg  500 mg Oral Q6H PRN Leandrew Koyanagi, MD   500 mg at 08/09/17 1611   Or  . methocarbamol (ROBAXIN) 500 mg in dextrose 5 % 50 mL IVPB  500 mg Intravenous Q6H PRN Leandrew Koyanagi, MD      . metoCLOPramide (REGLAN) tablet 5-10 mg  5-10 mg Oral Q8H PRN Leandrew Koyanagi, MD       Or  . metoCLOPramide (REGLAN) injection 5-10 mg  5-10 mg Intravenous Q8H PRN Leandrew Koyanagi, MD   10 mg at 08/07/17 1039  . montelukast (SINGULAIR) tablet 10 mg  10 mg Oral QHS Leandrew Koyanagi, MD   10 mg at 08/09/17 2037  . ondansetron (ZOFRAN) tablet 4 mg  4 mg Oral Q6H PRN Leandrew Koyanagi, MD       Or  . ondansetron [facility]) injection 4 mg  4 mg Intravenous Q6H PRN Leandrew Koyanagi, MD   4 mg at 08/07/17 0720  . oxyCODONE (Oxy IR/ROXICODONE) immediate release tablet 10-15 mg  10-15 mg Oral Q4H PRN Leandrew Koyanagi, MD   15 mg at 08/09/17 2037  . oxyCODONE (Oxy IR/ROXICODONE) immediate release tablet 5-10 mg  5-10 mg Oral Q4H PRN Leandrew Koyanagi, MD   10 mg at 08/07/17 1623  . oxyCODONE (OXYCONTIN) 12 hr tablet 10 mg  10 mg Oral Q12H Leandrew Koyanagi, MD   10 mg at 08/10/17 1045  . polyethylene glycol (MIRALAX / GLYCOLAX) packet 17 g  17 g Oral Daily PRN Leandrew Koyanagi, MD      . pravastatin (PRAVACHOL) tablet 20  mg  20 mg Oral Daily Leandrew Koyanagi, MD   20 mg at 08/09/17 2037  . sorbitol 70 % solution 30 mL  30 mL Oral  Daily PRN Leandrew Koyanagi, MD         Discharge Medications: Please see discharge summary for a list of discharge medications.  Relevant Imaging Results:  Relevant Lab Results:   Additional Information SS#: [id number]  Normajean Baxter, LCSW

## 2017-08-10 NOTE — Clinical Social Work Placement (Signed)
   CLINICAL SOCIAL WORK PLACEMENT  NOTE  Date:  08/10/2017  Patient Details  Name: Christian [name] MRN: [id number] Date of Birth: 04/08/1961  Clinical Social Work is seeking post-discharge placement for this patient at the [place] level of care (*CSW will initial, date and re-position this form in  chart as items are completed):  Yes   Patient/family provided with [facility] Work Department's list of facilities offering this level of care within the geographic area requested by the patient (or if unable, by the patient's family).  Yes   Patient/family informed of their freedom to choose among providers that offer the needed level of care, that participate in Medicare, Medicaid or managed care program needed by the patient, have an available bed and are willing to accept the patient.  Yes   Patient/family informed of [facility]'s ownership interest in [facility] and [facility], as well as of the fact that they are under no obligation to receive care at these facilities.  PASRR submitted to EDS on       PASRR number received on 08/10/17     Existing PASRR number confirmed on       FL2 transmitted to all facilities in geographic area requested by pt/family on 08/10/17     FL2 transmitted to all facilities within larger geographic area on       Patient informed that his/her managed care company has contracts with or will negotiate with certain facilities, including the following:        Yes   Patient/family informed of bed offers received.  Patient chooses bed at [facility]     Physician recommends and patient chooses bed at      Patient to be transferred to [facility] on 08/10/17.  Patient to be transferred to facility by [facility]     Patient family notified on 08/10/17 of transfer.  Name of family member notified:  pt responsible for self     PHYSICIAN       Additional Comment:     _______________________________________________ Normajean Baxter, LCSW 08/10/2017, 11:50 AM

## 2017-08-10 NOTE — Social Work (Signed)
Clinical Social Worker facilitated patient discharge including contacting patient family and facility to confirm patient discharge plans.  Clinical information faxed to facility and family agreeable with plan.    CSW arranged ambulance transport via [facility] to [facility].    RN to call [phone] to give report prior to discharge.  Pt going to Room 120A.  Clinical Social Worker will sign off for now as social work intervention is no longer needed. Please consult [place] again if new need arises.  Elissa Hefty, LCSW Clinical Social Worker [phone]

## 2017-08-10 NOTE — Progress Notes (Signed)
Report was given to Whitney(nurse)  Patient is being discharged via [place].  Written instructions sent with the patient to the Nursing Facility.

## 2017-08-10 NOTE — Progress Notes (Signed)
Physical Therapy Treatment Patient Details Name: Christian [name] MRN: [id number] DOB: May 15, 1960 Today's Date: 08/10/2017    History of Present Illness Pt is a 57 yo male s/p L TKA on 08/06/17. Hx of asthma, sleep apnea, COPD, HTN, DM, chronic low back pain, and dyspnea on exertion.    PT Comments    Pt is progressing well towards physical therapy goals. Overall min guard assist provided for safety throughout mobility. Significant time spent on gaining AROM and pt was able to achieve 91 in sitting. Pt reports he is d/cing to SNF today. Will continue to follow and progress as able per POC.   Follow Up Recommendations  Follow surgeon's recommendation for DC plan and follow-up therapies     Equipment Recommendations  Rolling walker with 5" wheels    Recommendations for Other Services       Precautions / Restrictions Precautions Precautions: Knee Precaution Booklet Issued: Yes (comment) Precaution Comments: Pt was educated on NO pillow/roll/ice pack under knee. Towel roll under heel to promote knee extension Restrictions Weight Bearing Restrictions: Yes LLE Weight Bearing: Weight bearing as tolerated    Mobility  Bed Mobility Overal bed mobility: Needs Assistance Bed Mobility: Supine to Sit     Supine to sit: Min guard     General bed mobility comments: Close guard for safety as pt transitioned to EOB. No assist required for management of LLE.   Transfers Overall transfer level: Needs assistance Equipment used: Rolling walker (2 wheeled) Transfers: Sit to/from Stand Sit to Stand: Min guard         General transfer comment: Close guard for safety as pt powered up to full standing position. Pt was cued for hand placement on seated surface for safety.   Ambulation/Gait Ambulation/Gait assistance: Min guard Ambulation Distance (Feet): 200 Feet Assistive device: Rolling walker (2 wheeled) Gait Pattern/deviations: Antalgic;Decreased stride length;Trunk  flexed;Step-through pattern Gait velocity: Decreased Gait velocity interpretation: <1.31 ft/sec, indicative of household ambulator General Gait Details: Pt with slow guarded gait. Mildly antalgic but overall steady with RW. Cues for improved posture and increased heel strike on t he L.   Stairs             Wheelchair Mobility    Modified Rankin (Stroke Patients Only)       Balance Overall balance assessment: Needs assistance Sitting-balance support: Feet supported;No upper extremity supported Sitting balance-Leahy Scale: Good     Standing balance support: Bilateral upper extremity supported Standing balance-Leahy Scale: Poor Standing balance comment: Reliant on UE support                            Cognition Arousal/Alertness: Awake/alert Behavior During Therapy: WFL for tasks assessed/performed Overall Cognitive Status: Within Functional Limits for tasks assessed                                        Exercises Total Joint Exercises Ankle Circles/Pumps: AROM;Both;20 reps Heel Slides: AAROM;Left;10 reps Goniometric ROM: 91 AROM in sitting    General Comments        Pertinent Vitals/Pain Pain Assessment: Faces Faces Pain Scale: Hurts even more Pain Location: L knee Pain Descriptors / Indicators: Grimacing Pain Intervention(s): Monitored during session    Home Living                      Prior Function  PT Goals (current goals can now be found in the care plan section) Acute Rehab PT Goals Patient Stated Goal: to go to rehab PT Goal Formulation: With patient/family Time For Goal Achievement: 08/21/17 Potential to Achieve Goals: Good Progress towards PT goals: Progressing toward goals    Frequency    7X/week      PT Plan Current plan remains appropriate    Co-evaluation              AM-PAC PT "6 Clicks" Daily Activity  Outcome Measure  Difficulty turning over in bed (including  adjusting bedclothes, sheets and blankets)?: A Little Difficulty moving from lying on back to sitting on the side of the bed? : A Little Difficulty sitting down on and standing up from a chair with arms (e.g., wheelchair, bedside commode, etc,.)?: A Little Help needed moving to and from a bed to chair (including a wheelchair)?: A Little Help needed walking in hospital room?: A Little Help needed climbing 3-5 steps with a railing? : A Little 6 Click Score: 18    End of Session Equipment Utilized During Treatment: Gait belt Activity Tolerance: Patient tolerated treatment well Patient left: in chair;with call bell/phone within reach Nurse Communication: Mobility status PT Visit Diagnosis: Muscle weakness (generalized) (M62.81);Difficulty in walking, not elsewhere classified (R26.2);Pain;Unsteadiness on feet (R26.81) Pain - Right/Left: Left Pain - part of body: Knee     Time: 6948-5462 PT Time Calculation (min) (ACUTE ONLY): 39 min  Charges:  $Gait Training: 23-37 mins $Therapeutic Exercise: 8-22 mins                    G Codes:       [name], PT, DPT Acute Rehabilitation Services Pager: [phone]    Thelma Comp 08/10/2017, 11:55 AM

## 2017-08-10 NOTE — Care Management Important Message (Signed)
Important Message  Patient Details  Name: Christian [name] MRN: [id number] Date of Birth: 1960-12-16   Medicare Important Message Given:  Yes    Adilee Lemme 08/10/2017, 1:17 PM

## 2017-08-14 ENCOUNTER — Encounter (HOSPITAL_COMMUNITY): Payer: Self-pay | Admitting: Orthopaedic Surgery

## 2017-08-14 NOTE — Anesthesia Postprocedure Evaluation (Signed)
Anesthesia Post Note  Patient: Engineer, drilling  Procedure(s) Performed: LEFT TOTAL KNEE ARTHROPLASTY (Left Knee)     Patient location during evaluation: PACU Anesthesia Type: Spinal, MAC and Regional Level of consciousness: awake and alert Pain management: pain level controlled Vital Signs Assessment: post-procedure vital signs reviewed and stable Respiratory status: spontaneous breathing, nonlabored ventilation, respiratory function stable and patient connected to nasal cannula oxygen Cardiovascular status: stable and blood pressure returned to baseline Postop Assessment: no apparent nausea or vomiting Anesthetic complications: no    Last Vitals:  Vitals:   08/09/17 2043 08/10/17 0533  BP: 122/78 123/84  Pulse: 88 85  Resp: 16   Temp: 36.9 C 36.7 C  SpO2: 95% 90%    Last Pain:  Vitals:   08/10/17 1044  TempSrc:   PainSc: 5                  Wyndell Cardiff

## 2017-08-16 DIAGNOSIS — M25562 Pain in left knee: Secondary | ICD-10-CM | POA: Diagnosis not present

## 2017-08-16 DIAGNOSIS — I10 Essential (primary) hypertension: Secondary | ICD-10-CM | POA: Diagnosis not present

## 2017-08-16 DIAGNOSIS — J449 Chronic obstructive pulmonary disease, unspecified: Secondary | ICD-10-CM | POA: Diagnosis not present

## 2017-08-18 DIAGNOSIS — J449 Chronic obstructive pulmonary disease, unspecified: Secondary | ICD-10-CM | POA: Diagnosis not present

## 2017-08-18 DIAGNOSIS — M545 Low back pain: Secondary | ICD-10-CM | POA: Diagnosis not present

## 2017-08-18 DIAGNOSIS — Z96652 Presence of left artificial knee joint: Secondary | ICD-10-CM | POA: Diagnosis not present

## 2017-08-18 DIAGNOSIS — M25562 Pain in left knee: Secondary | ICD-10-CM | POA: Diagnosis not present

## 2017-08-24 ENCOUNTER — Encounter (INDEPENDENT_AMBULATORY_CARE_PROVIDER_SITE_OTHER): Payer: Self-pay | Admitting: Orthopaedic Surgery

## 2017-08-24 ENCOUNTER — Ambulatory Visit (INDEPENDENT_AMBULATORY_CARE_PROVIDER_SITE_OTHER): Payer: Medicare Other | Admitting: Orthopaedic Surgery

## 2017-08-24 DIAGNOSIS — Z96652 Presence of left artificial knee joint: Secondary | ICD-10-CM | POA: Insufficient documentation

## 2017-08-24 NOTE — Progress Notes (Signed)
2 week TKA follow up plan  Patient presents for 2 week follow up after left total knee replacement.  The incision is clean, dry, and intact and healing very well. There is no drainage, erythema, or signs of infection. Motion is progressing nicely.  We have encouraged continued use of Dayn hose as well as aspirin for DVT prophylaxis, and to continue with physical therapy exercises to work on strength and endurance. Patient is progressing well. Reminders were given about signs to be aware of including redness, drainage, increased pain, fevers, calf pain, shortness of breath, or any concern should generate a phone call or a return to see Christian [name] immediately. Will plan to follow up at 6 weeks post op for next evaluation with radiographs at that time including 3 view xrays of the operative knee.

## 2017-08-25 ENCOUNTER — Telehealth (INDEPENDENT_AMBULATORY_CARE_PROVIDER_SITE_OTHER): Payer: Self-pay | Admitting: Orthopaedic Surgery

## 2017-08-25 DIAGNOSIS — Z471 Aftercare following joint replacement surgery: Secondary | ICD-10-CM | POA: Diagnosis not present

## 2017-08-25 DIAGNOSIS — I10 Essential (primary) hypertension: Secondary | ICD-10-CM | POA: Diagnosis not present

## 2017-08-25 DIAGNOSIS — Z96652 Presence of left artificial knee joint: Secondary | ICD-10-CM | POA: Diagnosis not present

## 2017-08-25 DIAGNOSIS — Z9181 History of falling: Secondary | ICD-10-CM | POA: Diagnosis not present

## 2017-08-25 DIAGNOSIS — Z7984 Long term (current) use of oral hypoglycemic drugs: Secondary | ICD-10-CM | POA: Diagnosis not present

## 2017-08-25 DIAGNOSIS — Z7951 Long term (current) use of inhaled steroids: Secondary | ICD-10-CM | POA: Diagnosis not present

## 2017-08-25 DIAGNOSIS — Z7722 Contact with and (suspected) exposure to environmental tobacco smoke (acute) (chronic): Secondary | ICD-10-CM | POA: Diagnosis not present

## 2017-08-25 DIAGNOSIS — E1140 Type 2 diabetes mellitus with diabetic neuropathy, unspecified: Secondary | ICD-10-CM | POA: Diagnosis not present

## 2017-08-25 DIAGNOSIS — J449 Chronic obstructive pulmonary disease, unspecified: Secondary | ICD-10-CM | POA: Diagnosis not present

## 2017-08-25 DIAGNOSIS — Z7982 Long term (current) use of aspirin: Secondary | ICD-10-CM | POA: Diagnosis not present

## 2017-08-25 NOTE — Telephone Encounter (Signed)
Ok to do

## 2017-08-25 NOTE — Telephone Encounter (Signed)
Christian [name] -(PT) with [facility] called needing verbal orders for (PT) 3 wk 1 and 2 wk 1   The number to contact [place] is [phone]

## 2017-08-25 NOTE — Telephone Encounter (Signed)
OK on orders

## 2017-08-25 NOTE — Telephone Encounter (Signed)
Called no answer LMOM ok on orders.  

## 2017-08-26 ENCOUNTER — Telehealth: Payer: Self-pay | Admitting: Gastroenterology

## 2017-08-26 DIAGNOSIS — Z7984 Long term (current) use of oral hypoglycemic drugs: Secondary | ICD-10-CM | POA: Diagnosis not present

## 2017-08-26 DIAGNOSIS — Z7951 Long term (current) use of inhaled steroids: Secondary | ICD-10-CM | POA: Diagnosis not present

## 2017-08-26 DIAGNOSIS — J449 Chronic obstructive pulmonary disease, unspecified: Secondary | ICD-10-CM | POA: Diagnosis not present

## 2017-08-26 DIAGNOSIS — Z7982 Long term (current) use of aspirin: Secondary | ICD-10-CM | POA: Diagnosis not present

## 2017-08-26 DIAGNOSIS — Z7722 Contact with and (suspected) exposure to environmental tobacco smoke (acute) (chronic): Secondary | ICD-10-CM | POA: Diagnosis not present

## 2017-08-26 DIAGNOSIS — E1140 Type 2 diabetes mellitus with diabetic neuropathy, unspecified: Secondary | ICD-10-CM | POA: Diagnosis not present

## 2017-08-26 DIAGNOSIS — I10 Essential (primary) hypertension: Secondary | ICD-10-CM | POA: Diagnosis not present

## 2017-08-26 DIAGNOSIS — Z471 Aftercare following joint replacement surgery: Secondary | ICD-10-CM | POA: Diagnosis not present

## 2017-08-26 DIAGNOSIS — Z96652 Presence of left artificial knee joint: Secondary | ICD-10-CM | POA: Diagnosis not present

## 2017-08-26 DIAGNOSIS — Z9181 History of falling: Secondary | ICD-10-CM | POA: Diagnosis not present

## 2017-08-26 NOTE — Telephone Encounter (Signed)
Discussed path results letter, patient reassured. He understands that we will notify him in 5 years for repeat colonoscopy. If he has any changes in bowel habits, rectal bleeding or abdominal pain he will contact the office.

## 2017-08-28 DIAGNOSIS — E1140 Type 2 diabetes mellitus with diabetic neuropathy, unspecified: Secondary | ICD-10-CM | POA: Diagnosis not present

## 2017-08-28 DIAGNOSIS — I10 Essential (primary) hypertension: Secondary | ICD-10-CM | POA: Diagnosis not present

## 2017-08-28 DIAGNOSIS — Z7722 Contact with and (suspected) exposure to environmental tobacco smoke (acute) (chronic): Secondary | ICD-10-CM | POA: Diagnosis not present

## 2017-08-28 DIAGNOSIS — Z7951 Long term (current) use of inhaled steroids: Secondary | ICD-10-CM | POA: Diagnosis not present

## 2017-08-28 DIAGNOSIS — J449 Chronic obstructive pulmonary disease, unspecified: Secondary | ICD-10-CM | POA: Diagnosis not present

## 2017-08-28 DIAGNOSIS — Z9181 History of falling: Secondary | ICD-10-CM | POA: Diagnosis not present

## 2017-08-28 DIAGNOSIS — Z7984 Long term (current) use of oral hypoglycemic drugs: Secondary | ICD-10-CM | POA: Diagnosis not present

## 2017-08-28 DIAGNOSIS — Z96652 Presence of left artificial knee joint: Secondary | ICD-10-CM | POA: Diagnosis not present

## 2017-08-28 DIAGNOSIS — Z471 Aftercare following joint replacement surgery: Secondary | ICD-10-CM | POA: Diagnosis not present

## 2017-08-28 DIAGNOSIS — Z7982 Long term (current) use of aspirin: Secondary | ICD-10-CM | POA: Diagnosis not present

## 2017-08-29 DIAGNOSIS — J4520 Mild intermittent asthma, uncomplicated: Secondary | ICD-10-CM | POA: Diagnosis not present

## 2017-08-29 DIAGNOSIS — G4733 Obstructive sleep apnea (adult) (pediatric): Secondary | ICD-10-CM | POA: Diagnosis not present

## 2017-08-31 DIAGNOSIS — J449 Chronic obstructive pulmonary disease, unspecified: Secondary | ICD-10-CM | POA: Diagnosis not present

## 2017-08-31 DIAGNOSIS — Z7982 Long term (current) use of aspirin: Secondary | ICD-10-CM | POA: Diagnosis not present

## 2017-08-31 DIAGNOSIS — E1140 Type 2 diabetes mellitus with diabetic neuropathy, unspecified: Secondary | ICD-10-CM | POA: Diagnosis not present

## 2017-08-31 DIAGNOSIS — Z471 Aftercare following joint replacement surgery: Secondary | ICD-10-CM | POA: Diagnosis not present

## 2017-08-31 DIAGNOSIS — Z9181 History of falling: Secondary | ICD-10-CM | POA: Diagnosis not present

## 2017-08-31 DIAGNOSIS — Z96652 Presence of left artificial knee joint: Secondary | ICD-10-CM | POA: Diagnosis not present

## 2017-08-31 DIAGNOSIS — Z7984 Long term (current) use of oral hypoglycemic drugs: Secondary | ICD-10-CM | POA: Diagnosis not present

## 2017-08-31 DIAGNOSIS — Z7722 Contact with and (suspected) exposure to environmental tobacco smoke (acute) (chronic): Secondary | ICD-10-CM | POA: Diagnosis not present

## 2017-08-31 DIAGNOSIS — I10 Essential (primary) hypertension: Secondary | ICD-10-CM | POA: Diagnosis not present

## 2017-08-31 DIAGNOSIS — Z7951 Long term (current) use of inhaled steroids: Secondary | ICD-10-CM | POA: Diagnosis not present

## 2017-09-02 DIAGNOSIS — Z96652 Presence of left artificial knee joint: Secondary | ICD-10-CM | POA: Diagnosis not present

## 2017-09-02 DIAGNOSIS — Z7984 Long term (current) use of oral hypoglycemic drugs: Secondary | ICD-10-CM | POA: Diagnosis not present

## 2017-09-02 DIAGNOSIS — E1140 Type 2 diabetes mellitus with diabetic neuropathy, unspecified: Secondary | ICD-10-CM | POA: Diagnosis not present

## 2017-09-02 DIAGNOSIS — J449 Chronic obstructive pulmonary disease, unspecified: Secondary | ICD-10-CM | POA: Diagnosis not present

## 2017-09-02 DIAGNOSIS — Z471 Aftercare following joint replacement surgery: Secondary | ICD-10-CM | POA: Diagnosis not present

## 2017-09-02 DIAGNOSIS — Z7982 Long term (current) use of aspirin: Secondary | ICD-10-CM | POA: Diagnosis not present

## 2017-09-02 DIAGNOSIS — Z7951 Long term (current) use of inhaled steroids: Secondary | ICD-10-CM | POA: Diagnosis not present

## 2017-09-02 DIAGNOSIS — I10 Essential (primary) hypertension: Secondary | ICD-10-CM | POA: Diagnosis not present

## 2017-09-02 DIAGNOSIS — Z9181 History of falling: Secondary | ICD-10-CM | POA: Diagnosis not present

## 2017-09-02 DIAGNOSIS — Z7722 Contact with and (suspected) exposure to environmental tobacco smoke (acute) (chronic): Secondary | ICD-10-CM | POA: Diagnosis not present

## 2017-09-03 ENCOUNTER — Telehealth: Payer: Self-pay | Admitting: Internal Medicine

## 2017-09-03 NOTE — Telephone Encounter (Signed)
Patient called stating there is a recall on his bp meds losartan-hydrochlorotjiazide. Please fu with patient.

## 2017-09-04 MED ORDER — LISINOPRIL-HYDROCHLOROTHIAZIDE 10-12.5 MG PO TABS: 1.0000 | ORAL_TABLET | Freq: Every day | ORAL | 3 refills | Status: DC

## 2017-09-08 ENCOUNTER — Other Ambulatory Visit (INDEPENDENT_AMBULATORY_CARE_PROVIDER_SITE_OTHER): Payer: Self-pay

## 2017-09-08 ENCOUNTER — Telehealth (INDEPENDENT_AMBULATORY_CARE_PROVIDER_SITE_OTHER): Payer: Self-pay | Admitting: Orthopaedic Surgery

## 2017-09-08 MED ORDER — OXYCODONE-ACETAMINOPHEN 5-325 MG PO TABS: ORAL_TABLET | ORAL | 0 refills | Status: DC

## 2017-09-08 NOTE — Telephone Encounter (Signed)
Patient needs a rx refill on percocet. # [phone]

## 2017-09-08 NOTE — Telephone Encounter (Signed)
#  14.  1-2 tab daily prn

## 2017-09-08 NOTE — Telephone Encounter (Signed)
PLEASE ADVISE.

## 2017-09-08 NOTE — Telephone Encounter (Signed)
Rx printed will be ready for pick up tomorrow 09/09/17.

## 2017-09-08 NOTE — Telephone Encounter (Signed)
Called patient he is aware.

## 2017-09-14 ENCOUNTER — Ambulatory Visit: Payer: Medicare Other | Admitting: Internal Medicine

## 2017-09-14 NOTE — Telephone Encounter (Signed)
Patient called with questions about bp medications. I informed the patient per cma to stop using the losartan and take the lisinopril

## 2017-09-25 ENCOUNTER — Ambulatory Visit (INDEPENDENT_AMBULATORY_CARE_PROVIDER_SITE_OTHER): Payer: Medicare Other | Admitting: Orthopaedic Surgery

## 2017-09-25 ENCOUNTER — Ambulatory Visit (INDEPENDENT_AMBULATORY_CARE_PROVIDER_SITE_OTHER): Payer: Medicare Other

## 2017-09-25 ENCOUNTER — Encounter (INDEPENDENT_AMBULATORY_CARE_PROVIDER_SITE_OTHER): Payer: Self-pay | Admitting: Orthopaedic Surgery

## 2017-09-25 DIAGNOSIS — Z96652 Presence of left artificial knee joint: Secondary | ICD-10-CM

## 2017-09-25 MED ORDER — HYDROCODONE-ACETAMINOPHEN 5-325 MG PO TABS: 1.0000 | ORAL_TABLET | Freq: Two times a day (BID) | ORAL | 0 refills | Status: DC | PRN

## 2017-09-25 NOTE — Progress Notes (Signed)
Post-Op Visit Note   Patient: Christian [name]           Date of Birth: August 01, 1960           MRN: [id number] Visit Date: 09/25/2017 PCP: Ladell Pier, MD   Assessment & Plan:  Chief Complaint:  Chief Complaint  Patient presents with  . Left Knee - Follow-up, Routine Post Op, Pain, Edema, Numbness   Visit Diagnoses:  1. Presence of left artificial knee joint     Plan: Patient is a pleasant 57 year old gentleman who presents our clinic today 50 days status post left total knee replacement, date of surgery 08/06/2017.  He has finished outpatient physical therapy where he is regained a moderate amount of motion.  Still not 100%.  He does note some pain to the medial aspect but this is not significant.  He does note decreased sensation to the lateral aspect of his knee.  No fevers, chills or any other systemic symptoms.  Examination of his left knee reveals a well-healed surgical incision without evidence of infection.  He does have moderate swelling to the left lower extremity however his calf is soft and nontender.  Negative Homans.  Range of motion to 2-110 degrees.  He is stable to valgus varus stress.  He is neurovascularly intact distally.  At this point, we will have him continue to work on range of motion exercises.  He will follow-up with [place] in 3 months time for repeat evaluation.  Call with concerns or questions in the meantime.  Follow-Up Instructions: Return in about 3 months (around 12/26/2017).   Orders:  Orders Placed This Encounter  Procedures  . XR Knee 1-2 Views Left   No orders of the defined types were placed in this encounter.   Imaging: Xr Knee 1-2 Views Left  Result Date: 09/25/2017 Well seated prosthesis   PMFS History: Patient Active Problem List   Diagnosis Date Noted  . Presence of left artificial knee joint 08/24/2017  . Total knee replacement status 08/06/2017  . Moderate persistent asthma without complication 16/60/6301  . Primary  osteoarthritis of left knee 02/23/2017  . Chronic pain syndrome 04/02/2016  . Intrinsic asthma 03/25/2016  . Essential hypertension 01/23/2016  . Hyperlipidemia 01/23/2016  . Secondhand smoke exposure 01/23/2016  . Diabetes mellitus with neuropathy ([place]) 01/23/2016  . Musculoskeletal chest pain 01/22/2016   Past Medical History:  Diagnosis Date  . Allergy   . Arthritis    "left knee" (01/22/2016)  . Asthma   . Chronic lower back pain   . COPD (chronic obstructive pulmonary disease) ([place])   . Dyspnea    with extertion  . High cholesterol   . Hypertension   . Neuropathy   . OSA on CPAP   . Secondhand smoke exposure   . Sleep apnea    wears CPAP, not wearing now  for months  . Type II diabetes mellitus ([place]) dx'd 01/17/2016    Family History  Problem Relation Age of Onset  . Hypertension Mother   . Hypertension Father   . Heart Problems Brother        Heart transplant, pt does not know cause   . Rectal cancer Brother   . Colon cancer Neg Hx   . Esophageal cancer Neg Hx   . Stomach cancer Neg Hx     Past Surgical History:  Procedure Laterality Date  . ANTERIOR CERVICAL DECOMP/DISCECTOMY FUSION  ~ 1995   "titanium"  . APPENDECTOMY  ~ 1977  .  BACK SURGERY     x 2  L 4disectomy  . COLONOSCOPY W/ POLYPECTOMY    . INGUINAL HERNIA REPAIR Right ~ 2000  . LUMBAR DISC SURGERY  ~ 2000; ~2006  . NASAL SINUS SURGERY  ~ 2007  . TOTAL KNEE ARTHROPLASTY Left 08/06/2017   Procedure: LEFT TOTAL KNEE ARTHROPLASTY;  Surgeon: Leandrew Koyanagi, MD;  Location: [facility];  Service: Orthopedics;  Laterality: Left;   Social History   Occupational History  . Not on file  Tobacco Use  . Smoking status: Passive Smoke Exposure - Never Smoker  . Smokeless tobacco: Never Used  . Tobacco comment: passive cig smoking at work  1994- 2012  Substance and Sexual Activity  . Alcohol use: No  . Drug use: No  . Sexual activity: Yes

## 2017-09-29 DIAGNOSIS — J4520 Mild intermittent asthma, uncomplicated: Secondary | ICD-10-CM | POA: Diagnosis not present

## 2017-09-29 DIAGNOSIS — G4733 Obstructive sleep apnea (adult) (pediatric): Secondary | ICD-10-CM | POA: Diagnosis not present

## 2017-09-30 DIAGNOSIS — Z1211 Encounter for screening for malignant neoplasm of colon: Secondary | ICD-10-CM | POA: Diagnosis not present

## 2017-10-02 ENCOUNTER — Other Ambulatory Visit (INDEPENDENT_AMBULATORY_CARE_PROVIDER_SITE_OTHER): Payer: Self-pay

## 2017-10-02 ENCOUNTER — Telehealth (INDEPENDENT_AMBULATORY_CARE_PROVIDER_SITE_OTHER): Payer: Self-pay | Admitting: Physician Assistant

## 2017-10-02 MED ORDER — HYDROCODONE-ACETAMINOPHEN 5-325 MG PO TABS: 1.0000 | ORAL_TABLET | Freq: Two times a day (BID) | ORAL | 0 refills | Status: DC | PRN

## 2017-10-02 NOTE — Telephone Encounter (Signed)
yes

## 2017-10-02 NOTE — Telephone Encounter (Signed)
Rx ready for pick up at the front desk. Patient aware. 

## 2017-10-02 NOTE — Telephone Encounter (Signed)
See message below °

## 2017-10-02 NOTE — Telephone Encounter (Signed)
Amanda-pharmacist with [facility] called advised patient cut everything off of his Rx even the void part. Estill Bamberg asked if she could void the Rx and if the Rx can be reprinted and sent back in to them. The number to contact Estill Bamberg is [phone]

## 2017-10-05 ENCOUNTER — Telehealth (INDEPENDENT_AMBULATORY_CARE_PROVIDER_SITE_OTHER): Payer: Self-pay | Admitting: Orthopaedic Surgery

## 2017-10-05 NOTE — Telephone Encounter (Signed)
Christian [name] from [facility] called wanting to ask you a question about a RX that was sent over to the pharmacy on May 31st. CB # [phone]

## 2017-10-07 NOTE — Telephone Encounter (Signed)
Called back and this was taking care of already.

## 2017-10-14 ENCOUNTER — Encounter: Payer: Self-pay | Admitting: Family Medicine

## 2017-10-14 ENCOUNTER — Ambulatory Visit: Payer: Medicare Other | Attending: Family Medicine | Admitting: Family Medicine

## 2017-10-14 VITALS — BP 165/95 | HR 63 | Temp 97.9°F | Ht 70.0 in | Wt 174.4 lb

## 2017-10-14 DIAGNOSIS — Z79899 Other long term (current) drug therapy: Secondary | ICD-10-CM | POA: Diagnosis not present

## 2017-10-14 DIAGNOSIS — Z7982 Long term (current) use of aspirin: Secondary | ICD-10-CM | POA: Diagnosis not present

## 2017-10-14 DIAGNOSIS — E7800 Pure hypercholesterolemia, unspecified: Secondary | ICD-10-CM | POA: Diagnosis not present

## 2017-10-14 DIAGNOSIS — I10 Essential (primary) hypertension: Secondary | ICD-10-CM | POA: Diagnosis not present

## 2017-10-14 DIAGNOSIS — Z888 Allergy status to other drugs, medicaments and biological substances status: Secondary | ICD-10-CM | POA: Diagnosis not present

## 2017-10-14 DIAGNOSIS — J4540 Moderate persistent asthma, uncomplicated: Secondary | ICD-10-CM | POA: Diagnosis not present

## 2017-10-14 DIAGNOSIS — G4733 Obstructive sleep apnea (adult) (pediatric): Secondary | ICD-10-CM | POA: Diagnosis not present

## 2017-10-14 DIAGNOSIS — E1140 Type 2 diabetes mellitus with diabetic neuropathy, unspecified: Secondary | ICD-10-CM | POA: Diagnosis not present

## 2017-10-14 DIAGNOSIS — J449 Chronic obstructive pulmonary disease, unspecified: Secondary | ICD-10-CM | POA: Diagnosis not present

## 2017-10-14 DIAGNOSIS — Z7984 Long term (current) use of oral hypoglycemic drugs: Secondary | ICD-10-CM | POA: Insufficient documentation

## 2017-10-14 MED ORDER — FLUTICASONE-SALMETEROL 250-50 MCG/DOSE IN AEPB: 1.0000 | INHALATION_SPRAY | Freq: Two times a day (BID) | RESPIRATORY_TRACT | 3 refills | Status: DC

## 2017-10-14 MED ORDER — ALBUTEROL SULFATE HFA 108 (90 BASE) MCG/ACT IN AERS: 1.0000 | INHALATION_SPRAY | Freq: Four times a day (QID) | RESPIRATORY_TRACT | 2 refills | Status: DC | PRN

## 2017-10-14 MED ORDER — METHYLPREDNISOLONE SODIUM SUCC 125 MG IJ SOLR
62.5000 mg | Freq: Once | INTRAMUSCULAR | Status: AC
  Administered 2017-10-14: 62.5 mg via INTRAMUSCULAR

## 2017-10-14 NOTE — Progress Notes (Signed)
Subjective:  Patient ID: Christian [name], male    DOB: 1960/07/08  Age: 57 y.o. MRN: [id number]  CC: Asthma   HPI Christian [name] is a 57 year old male with a history of type 2 diabetes mellitus (A1c 5.9), hypertension, asthma who presents today for an acute visit complaining of shortness of breath, chest tightness and having to use his pro-air frequently.  He has been using Singulair and Symbicort appears on his med list however he does not have this he states.  He has had to use his nebulizer treatment in the last of which he used was last night. He denies URI symptoms, fever,  history of sick contacts. Blood pressure is elevated and he is yet to take his antihypertensive.  Past Medical History:  Diagnosis Date  . Allergy   . Arthritis    "left knee" (01/22/2016)  . Asthma   . Chronic lower back pain   . COPD (chronic obstructive pulmonary disease) ([place])   . Dyspnea    with extertion  . High cholesterol   . Hypertension   . Neuropathy   . OSA on CPAP   . Secondhand smoke exposure   . Sleep apnea    wears CPAP, not wearing now  for months  . Type II diabetes mellitus ([place]) dx'd 01/17/2016   Past Surgical History:  Procedure Laterality Date  . ANTERIOR CERVICAL DECOMP/DISCECTOMY FUSION  ~ 1995   "titanium"  . APPENDECTOMY  ~ 1977  . BACK SURGERY     x 2  L 4disectomy  . COLONOSCOPY W/ POLYPECTOMY    . INGUINAL HERNIA REPAIR Right ~ 2000  . LUMBAR DISC SURGERY  ~ 2000; ~2006  . NASAL SINUS SURGERY  ~ 2007  . TOTAL KNEE ARTHROPLASTY Left 08/06/2017   Procedure: LEFT TOTAL KNEE ARTHROPLASTY;  Surgeon: Leandrew Koyanagi, MD;  Location: [facility];  Service: Orthopedics;  Laterality: Left;    Allergies  Allergen Reactions  . Phenergan [Promethazine Hcl] Itching and Other (See Comments)    burning     Outpatient Medications Prior to Visit  Medication Sig Dispense Refill  . albuterol (PROVENTIL) (5 MG/ML) 0.5% nebulizer solution Take 2.5 mg by nebulization every 6 (six) hours  as needed for wheezing or shortness of breath.    Marland Kitchen amLODipine (NORVASC) 10 MG tablet Take 1 tablet (10 mg total) by mouth daily. 90 tablet 3  . aspirin EC 325 MG tablet Take 1 tablet (325 mg total) by mouth 2 (two) times daily. 84 tablet 0  . Blood Glucose Monitoring Suppl (ONE TOUCH ULTRA 2) w/Device KIT Use as directed 3 times daily E11.9 1 each 0  . Blood Glucose Monitoring Suppl Supplies MISC Prodigy Autocode test stripes - check blood sugar once daily 100 each 12  . diclofenac sodium (VOLTAREN) 1 % GEL Apply 2 g 4 (four) times daily topically. (Patient taking differently: Apply 2 g topically 4 (four) times daily as needed (for pain.). ) 1 Tube 5  . fluticasone (FLONASE) 50 MCG/ACT nasal spray Place 2 sprays into both nostrils daily. 16 g 6  . gabapentin (NEURONTIN) 300 MG capsule Take 1 capsule (300 mg total) by mouth 4 (four) times daily. (Patient taking differently: Take 300 mg by mouth 3 (three) times daily. Morning, noon, night.) 120 capsule 2  . glucose blood (TRUE METRIX BLOOD GLUCOSE TEST) test strip Check Blood sugar 1-2 x a day.  Has Prodigy Auto code meter 100 each 12  . HYDROcodone-acetaminophen (NORCO) 5-325 MG tablet Take  1 tablet by mouth 2 (two) times daily as needed for moderate pain. 14 tablet 0  . ipratropium-albuterol (DUONEB) 0.5-2.5 (3) MG/3ML SOLN Take 3 mLs by nebulization every 4 (four) hours as needed. 360 mL 0  . lisinopril-hydrochlorothiazide (PRINZIDE,ZESTORETIC) 10-12.5 MG tablet Take 1 tablet by mouth daily. 90 tablet 3  . metFORMIN (GLUCOPHAGE XR) 500 MG 24 hr tablet Take 1 tablet (500 mg total) by mouth daily with breakfast. 90 tablet 3  . methocarbamol (ROBAXIN) 750 MG tablet Take 1 tablet (750 mg total) by mouth 2 (two) times daily as needed for muscle spasms. 60 tablet 0  . montelukast (SINGULAIR) 10 MG tablet Take 1 tablet (10 mg total) by mouth at bedtime. 30 tablet 3  . ondansetron (ZOFRAN) 4 MG tablet Take 1-2 tablets (4-8 mg total) by mouth every 8 (eight)  hours as needed for nausea or vomiting. 40 tablet 0  . pravastatin (PRAVACHOL) 20 MG tablet Take 1 tablet (20 mg total) by mouth daily. (Patient taking differently: Take 20 mg by mouth at bedtime. ) 90 tablet 3  . predniSONE (DELTASONE) 10 MG tablet 6,5,4,3,2,1 take each days dose in morning with food 21 tablet 0  . senna-docusate (SENOKOT S) 8.6-50 MG tablet Take 1 tablet by mouth at bedtime as needed. 30 tablet 1  . TRUEPLUS LANCETS 28G MISC Check glucose 2 times a day 100 each 3  . albuterol (PROAIR HFA) 108 (90 Base) MCG/ACT inhaler Inhale 1-2 puffs into the lungs every 6 (six) hours as needed for wheezing or shortness of breath. (Patient taking differently: Inhale 2 puffs into the lungs 2 (two) times daily. ) 1 Inhaler 2  . budesonide-formoterol (SYMBICORT) 80-4.5 MCG/ACT inhaler Inhale 2 puffs into the lungs 2 (two) times daily. 3 Inhaler 3  . oxyCODONE-acetaminophen (PERCOCET) 5-325 MG tablet 1-2 tab daily prn (Patient not taking: Reported on 09/25/2017) 14 tablet 0   No facility-administered medications prior to visit.     ROS Review of Systems  Constitutional: Negative for activity change and appetite change.  HENT: Negative for sinus pressure and sore throat.   Eyes: Negative for visual disturbance.  Respiratory: Positive for chest tightness and shortness of breath. Negative for cough.   Cardiovascular: Negative for chest pain and leg swelling.  Gastrointestinal: Negative for abdominal distention, abdominal pain, constipation and diarrhea.  Endocrine: Negative.   Genitourinary: Negative for dysuria.  Musculoskeletal: Negative for joint swelling and myalgias.  Skin: Negative for rash.  Allergic/Immunologic: Negative.   Neurological: Negative for weakness, light-headedness and numbness.  Psychiatric/Behavioral: Negative for dysphoric mood and suicidal ideas.    Objective:  BP (!) 165/95   Pulse 63   Temp 97.9 F (36.6 C) (Oral)   Ht _0  (1.778 m)   Wt 174 lb 6.4 oz (79.1  kg)   SpO2 98%   BMI 25.02 kg/m   BP/Weight 10/14/2017 08/10/2017 7/84/6962  Systolic BP 952 841 -  Diastolic BP 95 84 -  Wt. (Lbs) 174.4 - 173  BMI 25.02 - 24.82      Physical Exam  Constitutional: He is oriented to person, place, and time. He appears well-developed and well-nourished.  Cardiovascular: Normal rate, normal heart sounds and intact distal pulses.  No murmur heard. Pulmonary/Chest: Effort normal and breath sounds normal. He has no wheezes. He has no rales. He exhibits no tenderness.  Abdominal: Soft. Bowel sounds are normal. He exhibits no distension and no mass. There is no tenderness.  Musculoskeletal: Normal range of motion.  Neurological: He is  alert and oriented to person, place, and time.  Skin: Skin is warm and dry.  Psychiatric: He has a normal mood and affect.     Assessment & Plan:   1. Moderate persistent asthma without complication Poorly controlled due to inadequate treatment with controller medication Intramuscular Solu-Medrol administered in the clinic Commenced ICS/LABA - Fluticasone-Salmeterol (ADVAIR DISKUS) 250-50 MCG/DOSE AEPB; Inhale 1 puff into the lungs 2 (two) times daily.  Dispense: 1 each; Refill: 3 - albuterol (PROAIR HFA) 108 (90 Base) MCG/ACT inhaler; Inhale 1-2 puffs into the lungs every 6 (six) hours as needed for wheezing or shortness of breath.  Dispense: 1 Inhaler; Refill: 2 - methylPREDNISolone sodium succinate (SOLU-MEDROL) 125 mg/2 mL injection 62.5 mg   Meds ordered this encounter  Medications  . Fluticasone-Salmeterol (ADVAIR DISKUS) 250-50 MCG/DOSE AEPB    Sig: Inhale 1 puff into the lungs 2 (two) times daily.    Dispense:  1 each    Refill:  3  . albuterol (PROAIR HFA) 108 (90 Base) MCG/ACT inhaler    Sig: Inhale 1-2 puffs into the lungs every 6 (six) hours as needed for wheezing or shortness of breath.    Dispense:  1 Inhaler    Refill:  2  . methylPREDNISolone sodium succinate (SOLU-MEDROL) 125 mg/2 mL injection  62.5 mg    Follow-up: Return for Follow-up of chronic medical condition, keep previously scheduled appointment with PCP.   Charlott Rakes MD

## 2017-10-14 NOTE — Patient Instructions (Signed)

## 2017-10-20 ENCOUNTER — Telehealth (INDEPENDENT_AMBULATORY_CARE_PROVIDER_SITE_OTHER): Payer: Self-pay | Admitting: Orthopaedic Surgery

## 2017-10-20 NOTE — Telephone Encounter (Signed)
When and what did he last get a refill?

## 2017-10-20 NOTE — Telephone Encounter (Signed)
Patient requesting rx refill of oxycodone. Patients # [phone]

## 2017-10-20 NOTE — Telephone Encounter (Signed)
Please advise 

## 2017-10-21 ENCOUNTER — Other Ambulatory Visit (INDEPENDENT_AMBULATORY_CARE_PROVIDER_SITE_OTHER): Payer: Self-pay

## 2017-10-21 MED ORDER — HYDROCODONE-ACETAMINOPHEN 5-325 MG PO TABS: ORAL_TABLET | ORAL | 0 refills | Status: DC

## 2017-10-21 NOTE — Telephone Encounter (Signed)
Refill #10.  1 tab daily prn

## 2017-10-21 NOTE — Telephone Encounter (Signed)
rx printed pending signature

## 2017-10-21 NOTE — Telephone Encounter (Signed)
Last given 10/02/17  Hydrocodone   Take 1 tablet by mouth 2 (two) times daily as needed for moderate pain.    #14

## 2017-10-22 ENCOUNTER — Ambulatory Visit: Payer: Medicare Other | Attending: Internal Medicine | Admitting: Internal Medicine

## 2017-10-22 ENCOUNTER — Encounter: Payer: Self-pay | Admitting: Internal Medicine

## 2017-10-22 VITALS — BP 117/77 | HR 76 | Temp 98.1°F | Resp 16 | Wt 174.6 lb

## 2017-10-22 DIAGNOSIS — Z96652 Presence of left artificial knee joint: Secondary | ICD-10-CM | POA: Diagnosis not present

## 2017-10-22 DIAGNOSIS — M1712 Unilateral primary osteoarthritis, left knee: Secondary | ICD-10-CM | POA: Insufficient documentation

## 2017-10-22 DIAGNOSIS — Z7722 Contact with and (suspected) exposure to environmental tobacco smoke (acute) (chronic): Secondary | ICD-10-CM | POA: Diagnosis not present

## 2017-10-22 DIAGNOSIS — H524 Presbyopia: Secondary | ICD-10-CM | POA: Diagnosis not present

## 2017-10-22 DIAGNOSIS — Z8249 Family history of ischemic heart disease and other diseases of the circulatory system: Secondary | ICD-10-CM | POA: Insufficient documentation

## 2017-10-22 DIAGNOSIS — Z888 Allergy status to other drugs, medicaments and biological substances status: Secondary | ICD-10-CM | POA: Insufficient documentation

## 2017-10-22 DIAGNOSIS — E119 Type 2 diabetes mellitus without complications: Secondary | ICD-10-CM

## 2017-10-22 DIAGNOSIS — Z79899 Other long term (current) drug therapy: Secondary | ICD-10-CM | POA: Insufficient documentation

## 2017-10-22 DIAGNOSIS — I10 Essential (primary) hypertension: Secondary | ICD-10-CM | POA: Insufficient documentation

## 2017-10-22 DIAGNOSIS — G4733 Obstructive sleep apnea (adult) (pediatric): Secondary | ICD-10-CM | POA: Diagnosis not present

## 2017-10-22 DIAGNOSIS — Z7982 Long term (current) use of aspirin: Secondary | ICD-10-CM | POA: Diagnosis not present

## 2017-10-22 DIAGNOSIS — J4540 Moderate persistent asthma, uncomplicated: Secondary | ICD-10-CM | POA: Diagnosis not present

## 2017-10-22 DIAGNOSIS — Z7984 Long term (current) use of oral hypoglycemic drugs: Secondary | ICD-10-CM | POA: Diagnosis not present

## 2017-10-22 DIAGNOSIS — E785 Hyperlipidemia, unspecified: Secondary | ICD-10-CM | POA: Insufficient documentation

## 2017-10-22 DIAGNOSIS — E0840 Diabetes mellitus due to underlying condition with diabetic neuropathy, unspecified: Secondary | ICD-10-CM | POA: Diagnosis not present

## 2017-10-22 DIAGNOSIS — G894 Chronic pain syndrome: Secondary | ICD-10-CM | POA: Diagnosis not present

## 2017-10-22 LAB — GLUCOSE, POCT (MANUAL RESULT ENTRY): POC GLUCOSE: 118 mg/dL — AB (ref 70–99)

## 2017-10-22 LAB — HM DIABETES EYE EXAM

## 2017-10-22 MED ORDER — PREDNISONE 10 MG PO TABS: ORAL_TABLET | ORAL | 0 refills | Status: DC

## 2017-10-22 NOTE — Telephone Encounter (Signed)
Rx was picked up yesterday 

## 2017-10-22 NOTE — Patient Instructions (Signed)
Check with [facility] about the Advair and Albuterol prescriptions that were sent to them earlier this month.  Use the Advair inhaler daily as prescribed.

## 2017-10-22 NOTE — Progress Notes (Signed)
Patient ID: Christian [name], male    DOB: 01/20/1961  MRN: [id number]  CC: Follow-up (2 week from rehab) and Medication Refill   Subjective: Christian [name] is a 57 y.o. male who presents for chronic ds management His concerns today include:  57 year old with history of OSA on CPAP, asthma/copdnever smoked, HL, HTN, diabetes type 2, OA LT knee, chronic pain syndrome (surgery on neck and 2 surgeries lower back; last 2008).  Had TKR since last visit.  He is recovering well.  Asthma:  Requesting rxn for Prednisone to keep on hand and use as needed.  He will be traveling to [facility] to visit family in 2 weeks and is concerned that he may get a flareup of his asthma while they are due to the hot weather.  -needs RF on Montelukast Out of Advir but using one given by family member that is  500/50 mg  dose.  States he was on 500 dose before he moved to [place] Using Albuterol MDI BID Uses neb treatment BID  DM: Has eye appt later today Compliant with Metformin Checks BS 1-2 x a day.  Range 99-118.  Had dec appetite for a while before and after his TKR.  Doing better now with getting in meals.   HTN:  Compliant with Lis/HCTZ He limit salt in the foods. Patient Active Problem List   Diagnosis Date Noted  . Presence of left artificial knee joint 08/24/2017  . Total knee replacement status 08/06/2017  . Moderate persistent asthma without complication 29/92/4268  . Primary osteoarthritis of left knee 02/23/2017  . Chronic pain syndrome 04/02/2016  . Intrinsic asthma 03/25/2016  . Essential hypertension 01/23/2016  . Hyperlipidemia 01/23/2016  . Secondhand smoke exposure 01/23/2016  . Diabetes mellitus with neuropathy ([place]) 01/23/2016  . Musculoskeletal chest pain 01/22/2016     Current Outpatient Medications on File Prior to Visit  Medication Sig Dispense Refill  . albuterol (PROAIR HFA) 108 (90 Base) MCG/ACT inhaler Inhale 1-2 puffs into the lungs every 6 (six) hours as needed for  wheezing or shortness of breath. 1 Inhaler 2  . albuterol (PROVENTIL) (5 MG/ML) 0.5% nebulizer solution Take 2.5 mg by nebulization every 6 (six) hours as needed for wheezing or shortness of breath.    Marland Kitchen amLODipine (NORVASC) 10 MG tablet Take 1 tablet (10 mg total) by mouth daily. 90 tablet 3  . aspirin EC 325 MG tablet Take 1 tablet (325 mg total) by mouth 2 (two) times daily. 84 tablet 0  . Blood Glucose Monitoring Suppl (ONE TOUCH ULTRA 2) w/Device KIT Use as directed 3 times daily E11.9 1 each 0  . Blood Glucose Monitoring Suppl Supplies MISC Prodigy Autocode test stripes - check blood sugar once daily 100 each 12  . diclofenac sodium (VOLTAREN) 1 % GEL Apply 2 g 4 (four) times daily topically. (Patient taking differently: Apply 2 g topically 4 (four) times daily as needed (for pain.). ) 1 Tube 5  . fluticasone (FLONASE) 50 MCG/ACT nasal spray Place 2 sprays into both nostrils daily. 16 g 6  . Fluticasone-Salmeterol (ADVAIR DISKUS) 250-50 MCG/DOSE AEPB Inhale 1 puff into the lungs 2 (two) times daily. 1 each 3  . gabapentin (NEURONTIN) 300 MG capsule Take 1 capsule (300 mg total) by mouth 4 (four) times daily. (Patient taking differently: Take 300 mg by mouth 3 (three) times daily. Morning, noon, night.) 120 capsule 2  . glucose blood (TRUE METRIX BLOOD GLUCOSE TEST) test strip Check Blood sugar 1-2 x a  day.  Has Prodigy Auto code meter 100 each 12  . ipratropium-albuterol (DUONEB) 0.5-2.5 (3) MG/3ML SOLN Take 3 mLs by nebulization every 4 (four) hours as needed. 360 mL 0  . lisinopril-hydrochlorothiazide (PRINZIDE,ZESTORETIC) 10-12.5 MG tablet Take 1 tablet by mouth daily. 90 tablet 3  . metFORMIN (GLUCOPHAGE XR) 500 MG 24 hr tablet Take 1 tablet (500 mg total) by mouth daily with breakfast. 90 tablet 3  . methocarbamol (ROBAXIN) 750 MG tablet Take 1 tablet (750 mg total) by mouth 2 (two) times daily as needed for muscle spasms. 60 tablet 0  . montelukast (SINGULAIR) 10 MG tablet Take 1 tablet  (10 mg total) by mouth at bedtime. 30 tablet 3  . ondansetron (ZOFRAN) 4 MG tablet Take 1-2 tablets (4-8 mg total) by mouth every 8 (eight) hours as needed for nausea or vomiting. 40 tablet 0  . pravastatin (PRAVACHOL) 20 MG tablet Take 1 tablet (20 mg total) by mouth daily. (Patient taking differently: Take 20 mg by mouth at bedtime. ) 90 tablet 3  . senna-docusate (SENOKOT S) 8.6-50 MG tablet Take 1 tablet by mouth at bedtime as needed. 30 tablet 1  . TRUEPLUS LANCETS 28G MISC Check glucose 2 times a day 100 each 3   No current facility-administered medications on file prior to visit.     Allergies  Allergen Reactions  . Phenergan [Promethazine Hcl] Itching and Other (See Comments)    burning    Social History   Socioeconomic History  . Marital status: Married    Spouse name: Not on file  . Number of children: Not on file  . Years of education: Not on file  . Highest education level: Not on file  Occupational History  . Not on file  Social Needs  . Financial resource strain: Not on file  . Food insecurity:    Worry: Not on file    Inability: Not on file  . Transportation needs:    Medical: Not on file    Non-medical: Not on file  Tobacco Use  . Smoking status: Passive Smoke Exposure - Never Smoker  . Smokeless tobacco: Never Used  . Tobacco comment: passive cig smoking at work  1994- 2012  Substance and Sexual Activity  . Alcohol use: No  . Drug use: No  . Sexual activity: Yes  Lifestyle  . Physical activity:    Days per week: Not on file    Minutes per session: Not on file  . Stress: Not on file  Relationships  . Social connections:    Talks on phone: Not on file    Gets together: Not on file    Attends religious service: Not on file    Active member of club or organization: Not on file    Attends meetings of clubs or organizations: Not on file    Relationship status: Not on file  . Intimate partner violence:    Fear of current or ex partner: Not on file     Emotionally abused: Not on file    Physically abused: Not on file    Forced sexual activity: Not on file  Other Topics Concern  . Not on file  Social History Narrative  . Not on file    Family History  Problem Relation Age of Onset  . Hypertension Mother   . Hypertension Father   . Heart Problems Brother        Heart transplant, pt does not know cause   . Rectal cancer Brother   .  Colon cancer Neg Hx   . Esophageal cancer Neg Hx   . Stomach cancer Neg Hx     Past Surgical History:  Procedure Laterality Date  . ANTERIOR CERVICAL DECOMP/DISCECTOMY FUSION  ~ 1995   "titanium"  . APPENDECTOMY  ~ 1977  . BACK SURGERY     x 2  L 4disectomy  . COLONOSCOPY W/ POLYPECTOMY    . INGUINAL HERNIA REPAIR Right ~ 2000  . LUMBAR DISC SURGERY  ~ 2000; ~2006  . NASAL SINUS SURGERY  ~ 2007  . TOTAL KNEE ARTHROPLASTY Left 08/06/2017   Procedure: LEFT TOTAL KNEE ARTHROPLASTY;  Surgeon: Leandrew Koyanagi, MD;  Location: [facility];  Service: Orthopedics;  Laterality: Left;    ROS: Review of Systems Negative except as stated above PHYSICAL EXAM: BP 117/77   Pulse 76   Temp 98.1 F (36.7 C) (Oral)   Resp 16   Wt 174 lb 9.6 oz (79.2 kg)   SpO2 95%   BMI 25.05 kg/m   Wt Readings from Last 3 Encounters:  10/22/17 174 lb 9.6 oz (79.2 kg)  10/14/17 174 lb 6.4 oz (79.1 kg)  08/06/17 173 lb (78.5 kg)    Physical Exam General appearance - alert, well appearing, and in no distress Mental status - normal mood, behavior, speech, dress, motor activity, and thought processes Neck - supple, no significant adenopathy Chest -breath sounds slightly decreased but symmetric bilaterally with adequate air entry..  No wheezes heard.   Heart - normal rate, regular rhythm, normal S1, S2, no murmurs, rubs, clicks or gallops Extremities - peripheral pulses normal, no pedal edema, no clubbing or cyanosis  Results for orders placed or performed in visit on 10/22/17  POCT glucose (manual entry)  Result Value Ref  Range   POC Glucose 118 (A) 70 - 99 mg/dl   Lab Results  Component Value Date   HGBA1C 5.9 (H) 08/04/2017    ASSESSMENT AND PLAN: 1. Controlled type 2 diabetes mellitus without complication, without long-term current use of insulin (HCC) Controlled.  Encouraged to continue healthy eating habits and regular exercise.  Keep his eye appointment today. - POCT glucose (manual entry)  2. Moderate persistent asthma without complication Advised patient to call [facility] to confirm that they got the prescription for his Advair and albuterol that were sent to them by Dr. Margarita Rana last week. -Advised using the Advair 250/50 as prescribed.  If still not adequately controlled we can step up to the 500/50 - predniSONE (DELTASONE) 10 MG tablet; 4 tabs PO daily x 2 days the 3 tabs x 2 days then 2 tabs x 2 days then 1 tab x 2 days  Dispense: 20 tablet; Refill: 0  3. Essential hypertension At goal.  Continue current medication and salt restriction  Patient was given the opportunity to ask questions.  Patient verbalized understanding of the plan and was able to repeat key elements of the plan.   Orders Placed This Encounter  Procedures  . POCT glucose (manual entry)     Requested Prescriptions   Signed Prescriptions Disp Refills  . predniSONE (DELTASONE) 10 MG tablet 20 tablet 0    Sig: 4 tabs PO daily x 2 days the 3 tabs x 2 days then 2 tabs x 2 days then 1 tab x 2 days    Return in about 3 months (around 01/22/2018).  Karle Plumber, MD, FACP

## 2017-10-22 NOTE — Progress Notes (Signed)
Pt is requesting a rx for prednisone

## 2017-11-29 DIAGNOSIS — J4520 Mild intermittent asthma, uncomplicated: Secondary | ICD-10-CM | POA: Diagnosis not present

## 2017-11-29 DIAGNOSIS — G4733 Obstructive sleep apnea (adult) (pediatric): Secondary | ICD-10-CM | POA: Diagnosis not present

## 2017-12-09 ENCOUNTER — Telehealth (INDEPENDENT_AMBULATORY_CARE_PROVIDER_SITE_OTHER): Payer: Self-pay | Admitting: Orthopaedic Surgery

## 2017-12-09 NOTE — Telephone Encounter (Signed)
Please advise 

## 2017-12-09 NOTE — Telephone Encounter (Signed)
Patient called needing Rx refilled (Percocet) The number to contact patient is [phone]

## 2017-12-09 NOTE — Telephone Encounter (Signed)
Can't do percocet at this point.  Can do norco #10.

## 2017-12-10 ENCOUNTER — Other Ambulatory Visit (INDEPENDENT_AMBULATORY_CARE_PROVIDER_SITE_OTHER): Payer: Self-pay

## 2017-12-10 MED ORDER — HYDROCODONE-ACETAMINOPHEN 5-325 MG PO TABS: 1.0000 | ORAL_TABLET | Freq: Two times a day (BID) | ORAL | 0 refills | Status: DC | PRN

## 2017-12-10 NOTE — Telephone Encounter (Signed)
Called patient. Rx ready for pick up

## 2017-12-25 ENCOUNTER — Ambulatory Visit (INDEPENDENT_AMBULATORY_CARE_PROVIDER_SITE_OTHER): Payer: Medicare Other | Admitting: Orthopaedic Surgery

## 2018-01-22 ENCOUNTER — Ambulatory Visit: Payer: Medicare Other | Admitting: Internal Medicine

## 2018-01-25 ENCOUNTER — Other Ambulatory Visit: Payer: Self-pay

## 2018-01-25 NOTE — Patient Outreach (Signed)
[facility]) Care Management  01/25/2018  Christian [name] Jul 04, 1960 [id number]   Medication Adherence call to Christian [name] left a message with family member to call back patient is due on Lisinopril /Hctz 10/12.5 mg. Christian [name] is showing past due under [facility].   [facility] Management Direct Dial [phone]  Fax [phone] [email or web address]

## 2018-01-29 ENCOUNTER — Ambulatory Visit (INDEPENDENT_AMBULATORY_CARE_PROVIDER_SITE_OTHER): Payer: Medicare Other | Admitting: Orthopaedic Surgery

## 2018-04-06 ENCOUNTER — Encounter (HOSPITAL_COMMUNITY): Payer: Self-pay

## 2018-04-06 ENCOUNTER — Emergency Department (HOSPITAL_COMMUNITY)
Admission: EM | Admit: 2018-04-06 | Discharge: 2018-04-06 | Disposition: A | Discharge status: Home / Self Care | Payer: Medicare Other | Attending: Emergency Medicine | Admitting: Emergency Medicine

## 2018-04-06 ENCOUNTER — Emergency Department (HOSPITAL_COMMUNITY): Payer: Medicare Other

## 2018-04-06 DIAGNOSIS — Z96652 Presence of left artificial knee joint: Secondary | ICD-10-CM | POA: Diagnosis not present

## 2018-04-06 DIAGNOSIS — Z7982 Long term (current) use of aspirin: Secondary | ICD-10-CM | POA: Diagnosis not present

## 2018-04-06 DIAGNOSIS — E119 Type 2 diabetes mellitus without complications: Secondary | ICD-10-CM | POA: Diagnosis not present

## 2018-04-06 DIAGNOSIS — J441 Chronic obstructive pulmonary disease with (acute) exacerbation: Secondary | ICD-10-CM | POA: Insufficient documentation

## 2018-04-06 DIAGNOSIS — Z7722 Contact with and (suspected) exposure to environmental tobacco smoke (acute) (chronic): Secondary | ICD-10-CM | POA: Diagnosis not present

## 2018-04-06 DIAGNOSIS — R079 Chest pain, unspecified: Secondary | ICD-10-CM

## 2018-04-06 DIAGNOSIS — Z79899 Other long term (current) drug therapy: Secondary | ICD-10-CM | POA: Diagnosis not present

## 2018-04-06 DIAGNOSIS — I10 Essential (primary) hypertension: Secondary | ICD-10-CM | POA: Diagnosis not present

## 2018-04-06 LAB — CBC WITH DIFFERENTIAL/PLATELET
Abs Immature Granulocytes: 0.01 10*3/uL (ref 0.00–0.07)
BASOS ABS: 0.1 10*3/uL (ref 0.0–0.1)
BASOS PCT: 2 %
EOS ABS: 0.6 10*3/uL — AB (ref 0.0–0.5)
EOS PCT: 11 %
HEMATOCRIT: 45.6 % (ref 39.0–52.0)
Hemoglobin: 14.7 g/dL (ref 13.0–17.0)
IMMATURE GRANULOCYTES: 0 %
Lymphocytes Relative: 26 %
Lymphs Abs: 1.3 10*3/uL (ref 0.7–4.0)
MCH: 28 pg (ref 26.0–34.0)
MCHC: 32.2 g/dL (ref 30.0–36.0)
MCV: 86.9 fL (ref 80.0–100.0)
MONOS PCT: 10 %
Monocytes Absolute: 0.5 10*3/uL (ref 0.1–1.0)
NEUTROS PCT: 51 %
NRBC: 0 % (ref 0.0–0.2)
Neutro Abs: 2.5 10*3/uL (ref 1.7–7.7)
PLATELETS: 208 10*3/uL (ref 150–400)
RBC: 5.25 MIL/uL (ref 4.22–5.81)
RDW: 14.6 % (ref 11.5–15.5)
WBC: 5 10*3/uL (ref 4.0–10.5)

## 2018-04-06 LAB — I-STAT TROPONIN, ED
TROPONIN I, POC: 0 ng/mL (ref 0.00–0.08)
Troponin i, poc: 0 ng/mL (ref 0.00–0.08)

## 2018-04-06 LAB — BASIC METABOLIC PANEL
ANION GAP: 9 (ref 5–15)
BUN: 11 mg/dL (ref 6–20)
CALCIUM: 8.5 mg/dL — AB (ref 8.9–10.3)
CO2: 24 mmol/L (ref 22–32)
Chloride: 109 mmol/L (ref 98–111)
Creatinine, Ser: 1.41 mg/dL — ABNORMAL HIGH (ref 0.61–1.24)
GFR calc Af Amer: >60 mL/min (ref >60)
GFR, EST NON AFRICAN AMERICAN: 55 mL/min — AB (ref >60)
GLUCOSE: 129 mg/dL — AB (ref 70–99)
Potassium: 4.5 mmol/L (ref 3.5–5.1)
Sodium: 142 mmol/L (ref 135–145)

## 2018-04-06 LAB — CBG MONITORING, ED: GLUCOSE-CAPILLARY: 90 mg/dL (ref 70–99)

## 2018-04-06 MED ORDER — PREDNISONE 20 MG PO TABS: ORAL_TABLET | ORAL | 0 refills | Status: DC

## 2018-04-06 MED ORDER — SODIUM CHLORIDE 0.9 % IV BOLUS
1000.0000 mL | Freq: Once | INTRAVENOUS | Status: AC
  Administered 2018-04-06: 1000 mL via INTRAVENOUS

## 2018-04-06 MED ORDER — ASPIRIN 81 MG PO CHEW
324.0000 mg | CHEWABLE_TABLET | Freq: Once | ORAL | Status: AC
  Administered 2018-04-06: 324 mg via ORAL
  Filled 2018-04-06: qty 4

## 2018-04-06 MED ORDER — IPRATROPIUM BROMIDE 0.02 % IN SOLN
0.5000 mg | Freq: Once | RESPIRATORY_TRACT | Status: AC
  Administered 2018-04-06: 0.5 mg via RESPIRATORY_TRACT
  Filled 2018-04-06: qty 2.5

## 2018-04-06 MED ORDER — ALBUTEROL SULFATE (2.5 MG/3ML) 0.083% IN NEBU
5.0000 mg | INHALATION_SOLUTION | Freq: Once | RESPIRATORY_TRACT | Status: AC
  Administered 2018-04-06: 5 mg via RESPIRATORY_TRACT
  Filled 2018-04-06: qty 6

## 2018-04-06 MED ORDER — PREDNISONE 20 MG PO TABS
60.0000 mg | ORAL_TABLET | Freq: Once | ORAL | Status: AC
  Administered 2018-04-06: 60 mg via ORAL
  Filled 2018-04-06: qty 3

## 2018-04-06 NOTE — ED Provider Notes (Signed)
[facility] Provider Note   CSN: [id number] Arrival date & time: 04/06/18  0857     History   Chief Complaint Chief Complaint  Patient presents with  . CP/Cough    HPI Christian [name] is a 57 y.o. male.  HPI  57 year old male with a history of COPD, type 2 diabetes, hypertension and hyperlipidemia presents with chest pain.  He states that this morning around 6 AM he developed chest squeezing.  He rates it as severe, 10/10.  He also notes he is been having cough and shortness of breath for about 1 week.  He has been having some yellow sputum.  Shortness of breath seems to be worse with the chest pain.  The chest pain does not radiate including to his back.  He has tried his inhalers without relief.  No fevers that he knows of.  Some trace bilateral lower extremity swelling but no unilateral swelling or recent long trips/surgery.  Past Medical History:  Diagnosis Date  . Allergy   . Arthritis    "left knee" (01/22/2016)  . Asthma   . Chronic lower back pain   . COPD (chronic obstructive pulmonary disease) ([place])   . Dyspnea    with extertion  . High cholesterol   . Hypertension   . Neuropathy   . OSA on CPAP   . Secondhand smoke exposure   . Sleep apnea    wears CPAP, not wearing now  for months  . Type II diabetes mellitus ([place]) dx'd 01/17/2016    Patient Active Problem List   Diagnosis Date Noted  . Presence of left artificial knee joint 08/24/2017  . Moderate persistent asthma without complication 81/04/7508  . Primary osteoarthritis of left knee 02/23/2017  . Chronic pain syndrome 04/02/2016  . Intrinsic asthma 03/25/2016  . Essential hypertension 01/23/2016  . Hyperlipidemia 01/23/2016  . Secondhand smoke exposure 01/23/2016  . Diabetes mellitus with neuropathy ([place]) 01/23/2016    Past Surgical History:  Procedure Laterality Date  . ANTERIOR CERVICAL DECOMP/DISCECTOMY FUSION  ~ 1995   "titanium"  . APPENDECTOMY  ~  1977  . BACK SURGERY     x 2  L 4disectomy  . COLONOSCOPY W/ POLYPECTOMY    . INGUINAL HERNIA REPAIR Right ~ 2000  . LUMBAR DISC SURGERY  ~ 2000; ~2006  . NASAL SINUS SURGERY  ~ 2007  . TOTAL KNEE ARTHROPLASTY Left 08/06/2017   Procedure: LEFT TOTAL KNEE ARTHROPLASTY;  Surgeon: Leandrew Koyanagi, MD;  Location: [facility];  Service: Orthopedics;  Laterality: Left;        Home Medications    Prior to Admission medications   Medication Sig Start Date End Date Taking? Authorizing Provider  albuterol (PROAIR HFA) 108 (90 Base) MCG/ACT inhaler Inhale 1-2 puffs into the lungs every 6 (six) hours as needed for wheezing or shortness of breath. 10/14/17   Charlott Rakes, MD  albuterol (PROVENTIL) (5 MG/ML) 0.5% nebulizer solution Take 2.5 mg by nebulization every 6 (six) hours as needed for wheezing or shortness of breath.    [provider]  amLODipine (NORVASC) 10 MG tablet Take 1 tablet (10 mg total) by mouth daily. 09/15/16   Maren Reamer, MD  aspirin EC 325 MG tablet Take 1 tablet (325 mg total) by mouth 2 (two) times daily. 08/06/17   Leandrew Koyanagi, MD  Blood Glucose Monitoring Suppl (ONE TOUCH ULTRA 2) w/Device KIT Use as directed 3 times daily E11.9 03/31/17   Karle Plumber  B, MD  Blood Glucose Monitoring Suppl Supplies MISC Prodigy Autocode test stripes - check blood sugar once daily 05/27/17   Ladell Pier, MD  diclofenac sodium (VOLTAREN) 1 % GEL Apply 2 g 4 (four) times daily topically. Patient taking differently: Apply 2 g topically 4 (four) times daily as needed (for pain.).  03/03/17   Leandrew Koyanagi, MD  fluticasone (FLONASE) 50 MCG/ACT nasal spray Place 2 sprays into both nostrils daily. 09/15/16   Langeland, Dawn T, MD  Fluticasone-Salmeterol (ADVAIR DISKUS) 250-50 MCG/DOSE AEPB Inhale 1 puff into the lungs 2 (two) times daily. 10/14/17   Charlott Rakes, MD  gabapentin (NEURONTIN) 300 MG capsule Take 1 capsule (300 mg total) by mouth 4 (four) times daily. Patient taking  differently: Take 300 mg by mouth 3 (three) times daily. Morning, noon, night. 03/31/17   Ladell Pier, MD  glucose blood (TRUE METRIX BLOOD GLUCOSE TEST) test strip Check Blood sugar 1-2 x a day.  Has Prodigy Auto code meter 06/08/17   Ladell Pier, MD  HYDROcodone-acetaminophen (NORCO/VICODIN) 5-325 MG tablet Take 1 tablet by mouth 2 (two) times daily as needed for moderate pain. 12/10/17   Leandrew Koyanagi, MD  ipratropium-albuterol (DUONEB) 0.5-2.5 (3) MG/3ML SOLN Take 3 mLs by nebulization every 4 (four) hours as needed. 06/22/17   Ladell Pier, MD  lisinopril-hydrochlorothiazide (PRINZIDE,ZESTORETIC) 10-12.5 MG tablet Take 1 tablet by mouth daily. 09/04/17   Ladell Pier, MD  metFORMIN (GLUCOPHAGE XR) 500 MG 24 hr tablet Take 1 tablet (500 mg total) by mouth daily with breakfast. 09/15/16   Maren Reamer, MD  methocarbamol (ROBAXIN) 750 MG tablet Take 1 tablet (750 mg total) by mouth 2 (two) times daily as needed for muscle spasms. 08/06/17   Leandrew Koyanagi, MD  montelukast (SINGULAIR) 10 MG tablet Take 1 tablet (10 mg total) by mouth at bedtime. 07/22/17   Argentina Donovan, PA-C  ondansetron (ZOFRAN) 4 MG tablet Take 1-2 tablets (4-8 mg total) by mouth every 8 (eight) hours as needed for nausea or vomiting. 08/06/17   Leandrew Koyanagi, MD  pravastatin (PRAVACHOL) 20 MG tablet Take 1 tablet (20 mg total) by mouth daily. Patient taking differently: Take 20 mg by mouth at bedtime.  09/15/16   Maren Reamer, MD  predniSONE (DELTASONE) 20 MG tablet 2 tabs po daily x 4 days 04/07/18   Sherwood Gambler, MD  senna-docusate (SENOKOT S) 8.6-50 MG tablet Take 1 tablet by mouth at bedtime as needed. 08/06/17   Leandrew Koyanagi, MD  TRUEPLUS LANCETS 28G MISC Check glucose 2 times a day 02/01/16   Maren Reamer, MD    Family History Family History  Problem Relation Age of Onset  . Hypertension Mother   . Hypertension Father   . Heart Problems Brother        Heart transplant, pt does not  know cause   . Rectal cancer Brother   . Colon cancer Neg Hx   . Esophageal cancer Neg Hx   . Stomach cancer Neg Hx     Social History Social History   Tobacco Use  . Smoking status: Passive Smoke Exposure - Never Smoker  . Smokeless tobacco: Never Used  . Tobacco comment: passive cig smoking at work  1994- 2012  Substance Use Topics  . Alcohol use: No  . Drug use: No     Allergies   Phenergan [promethazine hcl]   Review of Systems Review of Systems  Constitutional: Negative for  fever.  Respiratory: Positive for cough and shortness of breath.   Cardiovascular: Positive for chest pain and leg swelling.  Gastrointestinal: Negative for abdominal pain.  Musculoskeletal: Negative for back pain.  All other systems reviewed and are negative.    Physical Exam Updated Vital Signs BP (!) 151/93   Pulse 72   Temp 98.5 F (36.9 C) (Oral)   Resp 19   Ht 5' 10"  (1.778 m)   Wt 79.4 kg   SpO2 100%   BMI 25.11 kg/m   Physical Exam  Constitutional: He appears well-developed and well-nourished. No distress.  HENT:  Head: Normocephalic and atraumatic.  Right Ear: External ear normal.  Left Ear: External ear normal.  Nose: Nose normal.  Eyes: Right eye exhibits no discharge. Left eye exhibits no discharge.  Neck: Neck supple.  Cardiovascular: Normal rate, regular rhythm and normal heart sounds.  Pulses:      Radial pulses are 2+ on the right side, and 2+ on the left side.  Pulmonary/Chest: Effort normal. No accessory muscle usage. Tachypnea noted. He has decreased breath sounds. He has wheezes (faint, expiratory).  Abdominal: Soft. There is no tenderness.  Musculoskeletal: He exhibits edema (trace at ankles bilaterally).  Neurological: He is alert.  Skin: Skin is warm and dry. He is not diaphoretic.  Psychiatric: His mood appears not anxious.  Nursing note and vitals reviewed.    ED Treatments / Results  Labs (all labs ordered are listed, but only abnormal results  are displayed) Labs Reviewed  CBC WITH DIFFERENTIAL/PLATELET - Abnormal; Notable for the following components:      Result Value   Eosinophils Absolute 0.6 (*)    All other components within normal limits  BASIC METABOLIC PANEL - Abnormal; Notable for the following components:   Glucose, Bld 129 (*)    Creatinine, Ser 1.41 (*)    Calcium 8.5 (*)    GFR calc non Af Amer 55 (*)    All other components within normal limits  CBG MONITORING, ED  I-STAT TROPONIN, ED  I-STAT TROPONIN, ED    EKG EKG Interpretation  Date/Time:  Tuesday April 06 2018 09:05:10 EST Ventricular Rate:  82 PR Interval:    QRS Duration: 79 QT Interval:  367 QTC Calculation: 429 R Axis:   74 Text Interpretation:  Sinus rhythm Prolonged PR interval Borderline T wave abnormalities no significant change since Jan 2019 Confirmed by Sherwood Gambler [phone]) on 04/06/2018 9:07:36 AM   EKG Interpretation  Date/Time:  Tuesday April 06 2018 12:55:49 EST Ventricular Rate:  64 PR Interval:  210 QRS Duration: 88 QT Interval:  400 QTC Calculation: 412 R Axis:   49 Text Interpretation:  Sinus rhythm with 1st degree A-V block Cannot rule out Anterior infarct , age undetermined Abnormal ECG no significant change since earlier in the day or 2017 Confirmed by Sherwood Gambler [phone]) on 04/06/2018 1:03:04 PM        Radiology Dg Chest 2 View  Result Date: 04/06/2018 CLINICAL DATA:  57 year old male with shortness of breath and chest tightness for 5 days. EXAM: CHEST - 2 VIEW COMPARISON:  05/10/2017 and earlier. FINDINGS: Lung volumes and mediastinal contours remain normal. Visualized tracheal air column is within normal limits. Both lungs appear stable and clear. No pneumothorax or pleural effusion. No acute osseous abnormality identified. Negative visible bowel gas pattern. IMPRESSION: Negative.  No cardiopulmonary abnormality. Electronically Signed   By: Genevie Ann M.D.   On: 04/06/2018 10:20     Procedures Procedures (including critical  care time)  Medications Ordered in ED Medications  albuterol (PROVENTIL) (2.5 MG/3ML) 0.083% nebulizer solution 5 mg (5 mg Nebulization Given 04/06/18 0915)  ipratropium (ATROVENT) nebulizer solution 0.5 mg (0.5 mg Nebulization Given 04/06/18 0915)  aspirin chewable tablet 324 mg (324 mg Oral Given 04/06/18 0914)  sodium chloride 0.9 % bolus 1,000 mL (0 mLs Intravenous Stopped 04/06/18 1303)  predniSONE (DELTASONE) tablet 60 mg (60 mg Oral Given 04/06/18 1144)     Initial Impression / Assessment and Plan / ED Course  I have reviewed the triage vital signs and the nursing notes.  Pertinent labs & imaging results that were available during my care of the patient were reviewed by me and considered in my medical decision making (see chart for details).     Patient chest pain is probably more related to the COPD rather than ACS.  He has troponins negative x2.  ECGs with chronic T wave changes.  I doubt PE or an acute bacterial infection such as pneumonia.  He is feeling much better after albuterol treatment.  Given the length of his symptoms I will prescribe prednisone.  Given his history of diabetes I warned him about possible hyperglycemia.  However his glucose is okay today.  We discussed return precautions but otherwise he appears stable for outpatient follow-up with PCP.  Final Clinical Impressions(s) / ED Diagnoses   Final diagnoses:  COPD exacerbation ([place])  Nonspecific chest pain    ED Discharge Orders         Ordered    predniSONE (DELTASONE) 20 MG tablet     04/06/18 1318           Sherwood Gambler, MD 04/06/18 1320

## 2018-04-06 NOTE — ED Notes (Signed)
Patient verbalizes understanding of discharge instructions. Opportunity for questioning and answers were provided. Armband removed by staff, pt discharged from ED.  

## 2018-04-06 NOTE — Discharge Instructions (Signed)
Use your albuterol every 4 hours for the next 48 hours at least.  If you are needing it more than this or you develop worsening shortness of breath, chest pain, vomiting, fever, or any other new/concerning symptoms and return to the ER for evaluation.  Otherwise follow-up with your primary care physician.  Be sure to watch your glucose over the next few days as the prednisone will probably increase it.

## 2018-04-06 NOTE — ED Triage Notes (Signed)
Patient complains of cough and congestion that he attributes to his asthma x 1 week. This am developed central squeezing CP, no radiation. Used inhaler with no relief. Patient alert and oriented. Describes cough as productive with yellow sputum

## 2018-04-13 ENCOUNTER — Telehealth: Payer: Self-pay | Admitting: Internal Medicine

## 2018-04-13 DIAGNOSIS — J4540 Moderate persistent asthma, uncomplicated: Secondary | ICD-10-CM

## 2018-04-13 NOTE — Telephone Encounter (Signed)
1) Medication(s) Requested (by name): Lisinopril Gabapentin Pravastatin Amlodipine Losartan Metformin Albuterol ipratropium 2) Pharmacy of Choice:  [facility]

## 2018-04-13 NOTE — Telephone Encounter (Signed)
Tried to contact patient to inform message regarding refills. No answer. Will try again.

## 2018-04-13 NOTE — Telephone Encounter (Signed)
Please advise patient he needs to make an appointment before we can authorize refills.

## 2018-04-15 ENCOUNTER — Ambulatory Visit: Payer: Medicare Other | Admitting: Pulmonary Disease

## 2018-04-15 NOTE — Progress Notes (Deleted)
Subjective:    Patient ID: Christian [name], male    DOB: [birth year], 57 y.o.   MRN: [id number]  Synopsis: Referred in 2017 for evaluation of COPD. Had an echocardiogram in September 2017 that showed a normal LVEF normal RV size and function.  HPI No chief complaint on file.  ***  Past Medical History:  Diagnosis Date  . Allergy   . Arthritis    "left knee" (01/22/2016)  . Asthma   . Chronic lower back pain   . COPD (chronic obstructive pulmonary disease) ([place])   . Dyspnea    with extertion  . High cholesterol   . Hypertension   . Neuropathy   . OSA on CPAP   . Secondhand smoke exposure   . Sleep apnea    wears CPAP, not wearing now  for months  . Type II diabetes mellitus ([place]) dx'd 01/17/2016       Review of Systems  Constitutional: Positive for unexpected weight change. Negative for fever.  HENT: Positive for congestion, ear pain, postnasal drip, sinus pressure and trouble swallowing. Negative for dental problem, nosebleeds, rhinorrhea, sneezing and sore throat.   Eyes: Negative for redness and itching.  Respiratory: Positive for cough, shortness of breath and wheezing. Negative for chest tightness.   Cardiovascular: Negative for palpitations and leg swelling.  Gastrointestinal: Negative for nausea and vomiting.  Genitourinary: Negative for dysuria.  Musculoskeletal: Negative for joint swelling.  Skin: Negative for rash.  Neurological: Negative for headaches.  Hematological: Does not bruise/bleed easily.  Psychiatric/Behavioral: Negative for dysphoric mood. The patient is not nervous/anxious.        Objective:   Physical Exam There were no vitals filed for this visit.RA  ***  September 2017 chest x-ray images personally reviewed showing emphysematous changes but no pulmonary parenchymal abnormality otherwise  Records from his hospitalization in September 2017 reviewed were he was treated for bronchitis.     Assessment & Plan:   No diagnosis  found.  Discussion: ***    Current Outpatient Medications:  .  albuterol (PROAIR HFA) 108 (90 Base) MCG/ACT inhaler, Inhale 1-2 puffs into the lungs every 6 (six) hours as needed for wheezing or shortness of breath., Disp: 1 Inhaler, Rfl: 2 .  albuterol (PROVENTIL) (5 MG/ML) 0.5% nebulizer solution, Take 2.5 mg by nebulization every 6 (six) hours as needed for wheezing or shortness of breath., Disp: , Rfl:  .  amLODipine (NORVASC) 10 MG tablet, Take 1 tablet (10 mg total) by mouth daily., Disp: 90 tablet, Rfl: 3 .  aspirin EC 325 MG tablet, Take 1 tablet (325 mg total) by mouth 2 (two) times daily., Disp: 84 tablet, Rfl: 0 .  Blood Glucose Monitoring Suppl (ONE TOUCH ULTRA 2) w/Device KIT, Use as directed 3 times daily E11.9, Disp: 1 each, Rfl: 0 .  Blood Glucose Monitoring Suppl Supplies MISC, Prodig[employer] Autocode test stripes - check blood sugar once daily, Disp: 100 each, Rfl: 12 .  diclofenac sodium (VOLTAREN) 1 % GEL, Apply 2 g 4 (four) times daily topically. (Patient taking differently: Apply 2 g topically 4 (four) times daily as needed (for pain.). ), Disp: 1 Tube, Rfl: 5 .  fluticasone (FLONASE) 50 MCG/ACT nasal spray, Place 2 sprays into both nostrils daily., Disp: 16 g, Rfl: 6 .  Fluticasone-Salmeterol (ADVAIR DISKUS) 250-50 MCG/DOSE AEPB, Inhale 1 puff into the lungs 2 (two) times daily., Disp: 1 each, Rfl: 3 .  gabapentin (NEURONTIN) 300 MG capsule, Take 1 capsule (300 mg total) by mouth 4 (  four) times daily. (Patient taking differently: Take 300 mg by mouth 3 (three) times daily. Morning, noon, night.), Disp: 120 capsule, Rfl: 2 .  glucose blood (TRUE METRIX BLOOD GLUCOSE TEST) test strip, Check Blood sugar 1-2 x a day.  Has Prodigy Auto code meter, Disp: 100 each, Rfl: 12 .  HYDROcodone-acetaminophen (NORCO/VICODIN) 5-325 MG tablet, Take 1 tablet by mouth 2 (two) times daily as needed for moderate pain., Disp: 30 tablet, Rfl: 0 .  ipratropium-albuterol (DUONEB) 0.5-2.5 (3) MG/3ML SOLN,  Take 3 mLs by nebulization every 4 (four) hours as needed., Disp: 360 mL, Rfl: 0 .  lisinopril-hydrochlorothiazide (PRINZIDE,ZESTORETIC) 10-12.5 MG tablet, Take 1 tablet by mouth daily., Disp: 90 tablet, Rfl: 3 .  metFORMIN (GLUCOPHAGE XR) 500 MG 24 hr tablet, Take 1 tablet (500 mg total) by mouth daily with breakfast., Disp: 90 tablet, Rfl: 3 .  methocarbamol (ROBAXIN) 750 MG tablet, Take 1 tablet (750 mg total) by mouth 2 (two) times daily as needed for muscle spasms., Disp: 60 tablet, Rfl: 0 .  montelukast (SINGULAIR) 10 MG tablet, Take 1 tablet (10 mg total) by mouth at bedtime., Disp: 30 tablet, Rfl: 3 .  ondansetron (ZOFRAN) 4 MG tablet, Take 1-2 tablets (4-8 mg total) by mouth every 8 (eight) hours as needed for nausea or vomiting., Disp: 40 tablet, Rfl: 0 .  pravastatin (PRAVACHOL) 20 MG tablet, Take 1 tablet (20 mg total) by mouth daily. (Patient taking differently: Take 20 mg by mouth at bedtime. ), Disp: 90 tablet, Rfl: 3 .  predniSONE (DELTASONE) 20 MG tablet, 2 tabs po daily x 4 days, Disp: 8 tablet, Rfl: 0 .  senna-docusate (SENOKOT S) 8.6-50 MG tablet, Take 1 tablet by mouth at bedtime as needed., Disp: 30 tablet, Rfl: 1 .  TRUEPLUS LANCETS 28G MISC, Check glucose 2 times a day, Disp: 100 each, Rfl: 3

## 2018-05-27 ENCOUNTER — Ambulatory Visit: Payer: Medicare Other

## 2018-05-28 ENCOUNTER — Ambulatory Visit: Payer: Medicare Other | Attending: Internal Medicine | Admitting: Internal Medicine

## 2018-05-28 ENCOUNTER — Encounter: Payer: Self-pay | Admitting: Internal Medicine

## 2018-05-28 VITALS — BP 163/112 | HR 90 | Temp 98.2°F | Resp 16 | Wt 177.0 lb

## 2018-05-28 DIAGNOSIS — I10 Essential (primary) hypertension: Secondary | ICD-10-CM | POA: Diagnosis not present

## 2018-05-28 DIAGNOSIS — J40 Bronchitis, not specified as acute or chronic: Secondary | ICD-10-CM | POA: Diagnosis not present

## 2018-05-28 DIAGNOSIS — J4541 Moderate persistent asthma with (acute) exacerbation: Secondary | ICD-10-CM

## 2018-05-28 MED ORDER — METHYLPREDNISOLONE SODIUM SUCC 125 MG IJ SOLR
125.0000 mg | Freq: Once | INTRAMUSCULAR | Status: AC
  Administered 2018-05-28: 125 mg via INTRAMUSCULAR

## 2018-05-28 MED ORDER — AZITHROMYCIN 250 MG PO TABS: ORAL_TABLET | ORAL | 0 refills | Status: DC

## 2018-05-28 MED ORDER — IPRATROPIUM-ALBUTEROL 0.5-2.5 (3) MG/3ML IN SOLN: 3.0000 mL | RESPIRATORY_TRACT | 2 refills | Status: DC | PRN

## 2018-05-28 MED ORDER — ALBUTEROL SULFATE (2.5 MG/3ML) 0.083% IN NEBU
2.5000 mg | INHALATION_SOLUTION | Freq: Once | RESPIRATORY_TRACT | Status: AC
  Administered 2018-05-28: 2.5 mg via RESPIRATORY_TRACT

## 2018-05-28 MED ORDER — LISINOPRIL-HYDROCHLOROTHIAZIDE 10-12.5 MG PO TABS: 1.0000 | ORAL_TABLET | Freq: Every day | ORAL | 3 refills | Status: DC

## 2018-05-28 MED ORDER — SODIUM CHLORIDE 0.9 % IV SOLN: 125.0000 mg | Freq: Once | INTRAVENOUS | Status: DC

## 2018-05-28 MED ORDER — AMLODIPINE BESYLATE 10 MG PO TABS: 10.0000 mg | ORAL_TABLET | Freq: Every day | ORAL | 3 refills | Status: DC

## 2018-05-28 MED ORDER — FLUTICASONE-SALMETEROL 250-50 MCG/DOSE IN AEPB: 1.0000 | INHALATION_SPRAY | Freq: Two times a day (BID) | RESPIRATORY_TRACT | 3 refills | Status: DC

## 2018-05-28 MED ORDER — MONTELUKAST SODIUM 10 MG PO TABS: 10.0000 mg | ORAL_TABLET | Freq: Every day | ORAL | 3 refills | Status: DC

## 2018-05-28 MED ORDER — PREDNISONE 10 MG PO TABS: ORAL_TABLET | ORAL | 0 refills | Status: DC

## 2018-05-28 NOTE — Progress Notes (Signed)
Patient ID: Christian [name], male    DOB: [birth year]  MRN: [id number]  CC: URI   Subjective: Christian [name] is a 58 y.o. male who presents for UC visit. His concerns today include:  58 year old with history of OSA on CPAP, asthma/copdnever smoked, HL, HTN, diabetes type 2, OA LT knee, chronic pain syndrome (surgery on neck and 2 surgeries lower back; last 2008).  Pt c/o having a cold causing flare of his asthma. -symptoms include feeling hot, cough productive of yellow phlegm, chest and nasal congestion. No sore throat.  +rhinorrhea of yellow blood tinge spuum.  Started 2 wks ago and got progressively worse.   -endorses SOB and wheezing.   -using neb treatment 3 x a day and sometimes at nights Using Advair BID.  Out of Singulair.  Request to have nebulizer treatment today.  He feels that the ones given here in the office works better than he is at home.  He also request steroid injection  HTN:  Pt's BP noted to be elevated.  Out of Lisinopril/HCTZ and Norvasc x 3 wks. patient states he was not able to get them due to limited finances but plans to get them soon.  He requested that I sent refills locally to [facility] rather than through mail order Patient Active Problem List   Diagnosis Date Noted  . Presence of left artificial knee joint 08/24/2017  . Moderate persistent asthma without complication 01/18/3006  . Primary osteoarthritis of left knee 02/23/2017  . Chronic pain syndrome 04/02/2016  . Intrinsic asthma 03/25/2016  . Essential hypertension 01/23/2016  . Hyperlipidemia 01/23/2016  . Secondhand smoke exposure 01/23/2016  . Diabetes mellitus with neuropathy ([place]) 01/23/2016     Current Outpatient Medications on File Prior to Visit  Medication Sig Dispense Refill  . albuterol (PROAIR HFA) 108 (90 Base) MCG/ACT inhaler Inhale 1-2 puffs into the lungs every 6 (six) hours as needed for wheezing or shortness of breath. 1 Inhaler 2  . aspirin EC 325 MG tablet Take 1 tablet (325 mg  total) by mouth 2 (two) times daily. 84 tablet 0  . Blood Glucose Monitoring Suppl (ONE TOUCH ULTRA 2) w/Device KIT Use as directed 3 times daily E11.9 1 each 0  . Blood Glucose Monitoring Suppl Supplies MISC Prodigy Autocode test stripes - check blood sugar once daily 100 each 12  . diclofenac sodium (VOLTAREN) 1 % GEL Apply 2 g 4 (four) times daily topically. (Patient taking differently: Apply 2 g topically 4 (four) times daily as needed (for pain.). ) 1 Tube 5  . fluticasone (FLONASE) 50 MCG/ACT nasal spray Place 2 sprays into both nostrils daily. 16 g 6  . gabapentin (NEURONTIN) 300 MG capsule Take 1 capsule (300 mg total) by mouth 4 (four) times daily. (Patient taking differently: Take 300 mg by mouth 3 (three) times daily. Morning, noon, night.) 120 capsule 2  . glucose blood (TRUE METRIX BLOOD GLUCOSE TEST) test strip Check Blood sugar 1-2 x a day.  Has Prodigy Auto code meter 100 each 12  . metFORMIN (GLUCOPHAGE XR) 500 MG 24 hr tablet Take 1 tablet (500 mg total) by mouth daily with breakfast. 90 tablet 3  . methocarbamol (ROBAXIN) 750 MG tablet Take 1 tablet (750 mg total) by mouth 2 (two) times daily as needed for muscle spasms. 60 tablet 0  . ondansetron (ZOFRAN) 4 MG tablet Take 1-2 tablets (4-8 mg total) by mouth every 8 (eight) hours as needed for nausea or vomiting. 40 tablet 0  .  pravastatin (PRAVACHOL) 20 MG tablet Take 1 tablet (20 mg total) by mouth daily. (Patient taking differently: Take 20 mg by mouth at bedtime. ) 90 tablet 3  . senna-docusate (SENOKOT S) 8.6-50 MG tablet Take 1 tablet by mouth at bedtime as needed. 30 tablet 1  . TRUEPLUS LANCETS 28G MISC Check glucose 2 times a day 100 each 3   No current facility-administered medications on file prior to visit.     Allergies  Allergen Reactions  . Phenergan [Promethazine Hcl] Itching and Other (See Comments)    burning    Social History   Socioeconomic History  . Marital status: Married    Spouse name: Not on  file  . Number of children: Not on file  . Years of education: Not on file  . Highest education level: Not on file  Occupational History  . Not on file  Social Needs  . Financial resource strain: Not on file  . Food insecurity:    Worry: Not on file    Inability: Not on file  . Transportation needs:    Medical: Not on file    Non-medical: Not on file  Tobacco Use  . Smoking status: Passive Smoke Exposure - Never Smoker  . Smokeless tobacco: Never Used  . Tobacco comment: passive cig smoking at work  1994- 2012  Substance and Sexual Activity  . Alcohol use: No  . Drug use: No  . Sexual activity: Yes  Lifestyle  . Physical activity:    Days per week: Not on file    Minutes per session: Not on file  . Stress: Not on file  Relationships  . Social connections:    Talks on phone: Not on file    Gets together: Not on file    Attends religious service: Not on file    Active member of club or organization: Not on file    Attends meetings of clubs or organizations: Not on file    Relationship status: Not on file  . Intimate partner violence:    Fear of current or ex partner: Not on file    Emotionally abused: Not on file    Physically abused: Not on file    Forced sexual activity: Not on file  Other Topics Concern  . Not on file  Social History Narrative  . Not on file    Family History  Problem Relation Age of Onset  . Hypertension Mother   . Hypertension Father   . Heart Problems Brother        Heart transplant, pt does not know cause   . Rectal cancer Brother   . Colon cancer Neg Hx   . Esophageal cancer Neg Hx   . Stomach cancer Neg Hx     Past Surgical History:  Procedure Laterality Date  . ANTERIOR CERVICAL DECOMP/DISCECTOMY FUSION  ~ 1995   "titanium"  . APPENDECTOMY  ~ 1977  . BACK SURGERY     x 2  L 4disectomy  . COLONOSCOPY W/ POLYPECTOMY    . INGUINAL HERNIA REPAIR Right ~ 2000  . LUMBAR DISC SURGERY  ~ 2000; ~2006  . NASAL SINUS SURGERY  ~ 2007    . TOTAL KNEE ARTHROPLASTY Left 08/06/2017   Procedure: LEFT TOTAL KNEE ARTHROPLASTY;  Surgeon: Leandrew Koyanagi, MD;  Location: [facility];  Service: Orthopedics;  Laterality: Left;    ROS: Review of Systems Negative except as above PHYSICAL EXAM: BP (!) 163/112   Pulse 90   Temp 98.2  F (36.8 C) (Oral)   Resp 16   Wt 177 lb (80.3 kg)   SpO2 92%   BMI 25.40 kg/m   Physical Exam  General appearance - alert, well appearing, middle-aged African-American male and in no distress.  Patient has mild audible congestion Mental status - normal mood, behavior, speech, dress, motor activity, and thought processes Nose -left nares appears mildly inflamed.  No enlargement of nasal turbinates Mouth - mucous membranes moist, pharynx normal without lesions Neck - supple, no significant adenopathy Chest -scattered wheezes.  No crackles or rhonchi's  heart - normal rate, regular rhythm, normal S1, S2, no murmurs, rubs, clicks or gallops Extremities -no lower extremity edema  ASSESSMENT AND PLAN: 1. Moderate persistent asthma with acute exacerbation 2. Bronchitis Patient given nebulizer treatment today along with Solu-Medrol shot. Recommend flu shot.  However we are out.  Patient can get this at his local pharmacy. - Fluticasone-Salmeterol (ADVAIR DISKUS) 250-50 MCG/DOSE AEPB; Inhale 1 puff into the lungs 2 (two) times daily.  Dispense: 1 each; Refill: 3 - ipratropium-albuterol (DUONEB) 0.5-2.5 (3) MG/3ML SOLN; Take 3 mLs by nebulization every 4 (four) hours as needed.  Dispense: 360 mL; Refill: 2 - predniSONE (DELTASONE) 10 MG tablet; 3 tabs PO daily x 2 days then 2 tabs daily x 2 days then 1 tab PO daily  Dispense: 11 tablet; Refill: 0 - azithromycin (ZITHROMAX) 250 MG tablet; 2 tabs PO x 1 then 1 tab PO daily x 4 days  Dispense: 6 tablet; Refill: 0 - montelukast (SINGULAIR) 10 MG tablet; Take 1 tablet (10 mg total) by mouth at bedtime.  Dispense: 30 tablet; Refill: 3 - albuterol (PROVENTIL) (2.5  MG/3ML) 0.083% nebulizer solution 2.5 mg - methylPREDNISolone sodium succinate (SOLU-MEDROL) 125 mg/2 mL injection 125 mg  3. Essential hypertension Uncontrolled.  Patient has been out of medicines for 3 weeks.  He plans to get them filled soon - lisinopril-hydrochlorothiazide (PRINZIDE,ZESTORETIC) 10-12.5 MG tablet; Take 1 tablet by mouth daily.  Dispense: 90 tablet; Refill: 3 - amLODipine (NORVASC) 10 MG tablet; Take 1 tablet (10 mg total) by mouth daily.  Dispense: 90 tablet; Refill: 3  Patient was given the opportunity to ask questions.  Patient verbalized understanding of the plan and was able to repeat key elements of the plan.   No orders of the defined types were placed in this encounter.    Requested Prescriptions   Signed Prescriptions Disp Refills  . lisinopril-hydrochlorothiazide (PRINZIDE,ZESTORETIC) 10-12.5 MG tablet 90 tablet 3    Sig: Take 1 tablet by mouth daily.  Marland Kitchen amLODipine (NORVASC) 10 MG tablet 90 tablet 3    Sig: Take 1 tablet (10 mg total) by mouth daily.  . Fluticasone-Salmeterol (ADVAIR DISKUS) 250-50 MCG/DOSE AEPB 1 each 3    Sig: Inhale 1 puff into the lungs 2 (two) times daily.  Marland Kitchen ipratropium-albuterol (DUONEB) 0.5-2.5 (3) MG/3ML SOLN 360 mL 2    Sig: Take 3 mLs by nebulization every 4 (four) hours as needed.  . predniSONE (DELTASONE) 10 MG tablet 11 tablet 0    Sig: 3 tabs PO daily x 2 days then 2 tabs daily x 2 days then 1 tab PO daily  . azithromycin (ZITHROMAX) 250 MG tablet 6 tablet 0    Sig: 2 tabs PO x 1 then 1 tab PO daily x 4 days  . montelukast (SINGULAIR) 10 MG tablet 30 tablet 3    Sig: Take 1 tablet (10 mg total) by mouth at bedtime.    Return in about 2  months (around 07/27/2018).  Karle Plumber, MD, FACP

## 2018-05-28 NOTE — Patient Instructions (Addendum)
Please follow-up with our clinical pharmacist in 2 weeks for repeat blood pressure check.  I have sent prescription to your pharmacy for antibiotics, prednisone, Singulair and your nebulizer treatments.  Your blood pressure is not well controlled.  I have sent refills on your medications to the pharmacy.  Please take daily.  Please monitor your blood sugars while on prednisone as it can increase blood sugars.

## 2018-06-02 ENCOUNTER — Other Ambulatory Visit: Payer: Self-pay | Admitting: Internal Medicine

## 2018-06-02 DIAGNOSIS — E0840 Diabetes mellitus due to underlying condition with diabetic neuropathy, unspecified: Secondary | ICD-10-CM

## 2018-06-02 MED ORDER — METFORMIN HCL ER 500 MG PO TB24: 500.0000 mg | ORAL_TABLET | Freq: Every day | ORAL | 0 refills | Status: DC

## 2018-06-02 MED ORDER — GLUCOSE BLOOD VI STRP: ORAL_STRIP | 2 refills | Status: DC

## 2018-06-02 NOTE — Telephone Encounter (Signed)
1) Medication(s) Requested (by name): Metformin, Zofran and test trips  2) Pharmacy of Choice: [place] [address]  3) Special Requests: Pt states he is completely out of the Metformin   Approved medications will be sent to the pharmacy, we will reach out if there is an issue.  Requests made after 3pm may not be addressed until the following business day!  If a patient is unsure of the name of the medication(s) please note and ask patient to call back when they are able to provide all info, do not send to responsible party until all information is available!

## 2018-06-03 ENCOUNTER — Ambulatory Visit: Payer: Medicare Other

## 2018-06-03 NOTE — Progress Notes (Deleted)
_0  ID: Christian [name], male    DOB: 14-Apr-1961, 58 y.o.   MRN: [id number]  No chief complaint on file.   Referring provider: Ladell Pier, MD  HPI:   PMH:  Smoker/ Smoking History: Never Smoker Maintenance: Breo 200  Pt of: [place]   58 yo male never smoker seen for asthma consult 03/25/16  Has OSA on CPAP At bedtime    TEST ;Events  PFT 05/16/2016 >FEV1 60%, ratio 58, FVC 82%, DLCO 74%, no BD response .      Recent [facility] Encounters:     06/03/2018  - Visit   HPI  Tests:    FENO:  No results found for: NITRICOXIDE  PFT: PFT Results Latest Ref Rng & Units 05/07/2016  FVC-Pre L 3.11  FVC-Predicted Pre % 82  FVC-Post L 2.46  FVC-Predicted Post % 64  Pre FEV1/FVC % % 58  Post FEV1/FCV % % 70  FEV1-Pre L 1.79  FEV1-Predicted Pre % 60  FEV1-Post L 1.73  DLCO UNC% % 74  DLCO COR %Predicted % 108  TLC L 5.42  TLC % Predicted % 82  RV % Predicted % 103    Imaging: No results found.    Specialty Problems      Pulmonary Problems   Intrinsic asthma    January 2018 PFT> pre bronchodilator ratio 58%, FEV1 1.79L (60% pred), (post bronchodilator FEV didn't change but ratio changed, mostly because of what appears to be change in inspiratory flow), FVC 3.11L (82% pred), TLC 5.42L (82% pred), DLCO 22.17 (74% pred)      Moderate persistent asthma without complication      Allergies  Allergen Reactions  . Phenergan [Promethazine Hcl] Itching and Other (See Comments)    burning    Immunization History  Administered Date(s) Administered  . Influenza Split 02/23/2016  . Influenza,inj,Quad PF,6+ Mos 12/22/2016  . Pneumococcal Conjugate-13 03/25/2016  . Pneumococcal Polysaccharide-23 07/16/2016  . Tdap 04/02/2016    Past Medical History:  Diagnosis Date  . Allergy   . Arthritis    "left knee" (01/22/2016)  . Asthma   . Chronic lower back pain   . COPD (chronic obstructive pulmonary disease) ([place])   . Dyspnea    with  extertion  . High cholesterol   . Hypertension   . Neuropathy   . OSA on CPAP   . Secondhand smoke exposure   . Sleep apnea    wears CPAP, not wearing now  for months  . Type II diabetes mellitus ([place]) dx'd 01/17/2016    Tobacco History: Social History   Tobacco Use  Smoking Status Passive Smoke Exposure - Never Smoker  Smokeless Tobacco Never Used  Tobacco Comment   passive cig smoking at work  1994- 2012   Counseling given: Not Answered Comment: passive cig smoking at work  1994- 2012   Outpatient Encounter Medications as of 06/04/2018  Medication Sig  . albuterol (PROAIR HFA) 108 (90 Base) MCG/ACT inhaler Inhale 1-2 puffs into the lungs every 6 (six) hours as needed for wheezing or shortness of breath.  Marland Kitchen amLODipine (NORVASC) 10 MG tablet Take 1 tablet (10 mg total) by mouth daily.  Marland Kitchen aspirin EC 325 MG tablet Take 1 tablet (325 mg total) by mouth 2 (two) times daily.  Marland Kitchen azithromycin (ZITHROMAX) 250 MG tablet 2 tabs PO x 1 then 1 tab PO daily x 4 days  . Blood Glucose Monitoring Suppl (ONE TOUCH ULTRA 2) w/Device KIT Use as directed 3 times  daily E11.9  . Blood Glucose Monitoring Suppl Supplies MISC Prodigy Autocode test stripes - check blood sugar once daily  . diclofenac sodium (VOLTAREN) 1 % GEL Apply 2 g 4 (four) times daily topically. (Patient taking differently: Apply 2 g topically 4 (four) times daily as needed (for pain.). )  . fluticasone (FLONASE) 50 MCG/ACT nasal spray Place 2 sprays into both nostrils daily.  . Fluticasone-Salmeterol (ADVAIR DISKUS) 250-50 MCG/DOSE AEPB Inhale 1 puff into the lungs 2 (two) times daily.  . gabapentin (NEURONTIN) 300 MG capsule Take 1 capsule (300 mg total) by mouth 4 (four) times daily. (Patient taking differently: Take 300 mg by mouth 3 (three) times daily. Morning, noon, night.)  . glucose blood (TRUE METRIX BLOOD GLUCOSE TEST) test strip Check Blood sugar 1-2 x a day.  Has Prodigy Auto code meter  . ipratropium-albuterol (DUONEB)  0.5-2.5 (3) MG/3ML SOLN Take 3 mLs by nebulization every 4 (four) hours as needed.  . lisinopril-hydrochlorothiazide (PRINZIDE,ZESTORETIC) 10-12.5 MG tablet Take 1 tablet by mouth daily.  . metFORMIN (GLUCOPHAGE XR) 500 MG 24 hr tablet Take 1 tablet (500 mg total) by mouth daily with breakfast.  . methocarbamol (ROBAXIN) 750 MG tablet Take 1 tablet (750 mg total) by mouth 2 (two) times daily as needed for muscle spasms.  . montelukast (SINGULAIR) 10 MG tablet Take 1 tablet (10 mg total) by mouth at bedtime.  . ondansetron (ZOFRAN) 4 MG tablet Take 1-2 tablets (4-8 mg total) by mouth every 8 (eight) hours as needed for nausea or vomiting.  . pravastatin (PRAVACHOL) 20 MG tablet Take 1 tablet (20 mg total) by mouth daily. (Patient taking differently: Take 20 mg by mouth at bedtime. )  . predniSONE (DELTASONE) 10 MG tablet 3 tabs PO daily x 2 days then 2 tabs daily x 2 days then 1 tab PO daily  . senna-docusate (SENOKOT S) 8.6-50 MG tablet Take 1 tablet by mouth at bedtime as needed.  . TRUEPLUS LANCETS 28G MISC Check glucose 2 times a day   No facility-administered encounter medications on file as of 06/04/2018.      Review of Systems  Review of Systems   Physical Exam  There were no vitals taken for this visit.  Wt Readings from Last 5 Encounters:  05/28/18 177 lb (80.3 kg)  04/06/18 175 lb (79.4 kg)  10/22/17 174 lb 9.6 oz (79.2 kg)  10/14/17 174 lb 6.4 oz (79.1 kg)  08/06/17 173 lb (78.5 kg)     Physical Exam    Lab Results:  CBC    Component Value Date/Time   WBC 5.0 04/06/2018 0909   RBC 5.25 04/06/2018 0909   HGB 14.7 04/06/2018 0909   HCT 45.6 04/06/2018 0909   PLT 208 04/06/2018 0909   MCV 86.9 04/06/2018 0909   MCH 28.0 04/06/2018 0909   MCHC 32.2 04/06/2018 0909   RDW 14.6 04/06/2018 0909   LYMPHSABS 1.3 04/06/2018 0909   MONOABS 0.5 04/06/2018 0909   EOSABS 0.6 (H) 04/06/2018 0909   BASOSABS 0.1 04/06/2018 0909    BMET    Component Value Date/Time   NA  142 04/06/2018 0909   K 4.5 04/06/2018 0909   CL 109 04/06/2018 0909   CO2 24 04/06/2018 0909   GLUCOSE 129 (H) 04/06/2018 0909   BUN 11 04/06/2018 0909   CREATININE 1.41 (H) 04/06/2018 0909   CREATININE 1.09 01/14/2016 1523   CALCIUM 8.5 (L) 04/06/2018 0909   GFRNONAA 55 (L) 04/06/2018 0909   GFRNONAA 75 01/14/2016   [place] 04/06/2018 0909   GFRAA 87 01/14/2016 1523    BNP No results found for: BNP  ProBNP No results found for: PROBNP    Assessment & Plan:     No problem-specific Assessment & Plan notes found for this encounter.     Lauraine Rinne, NP 06/03/2018   This appointment was *** with over 50% of the time in direct face-to-face patient care, assessment, plan of care, and follow-up.

## 2018-06-04 ENCOUNTER — Ambulatory Visit: Payer: Medicare Other | Admitting: Pulmonary Disease

## 2018-06-04 ENCOUNTER — Telehealth: Payer: Self-pay | Admitting: Internal Medicine

## 2018-06-04 NOTE — Telephone Encounter (Signed)
1) Medication(s) Requested (by name): Metformin  2) Pharmacy of Choice: [facility] on [address]  3) Special Requests: Pt states he put in a request for this medication 3 days ago but still has not received it. Please follow up   Approved medications will be sent to the pharmacy, we will reach out if there is an issue.  Requests made after 3pm may not be addressed until the following business day!  If a patient is unsure of the name of the medication(s) please note and ask patient to call back when they are able to provide all info, do not send to responsible party until all information is available!

## 2018-06-04 NOTE — Telephone Encounter (Signed)
RX was sent to his [facility].

## 2018-06-07 ENCOUNTER — Other Ambulatory Visit: Payer: Self-pay

## 2018-06-07 ENCOUNTER — Telehealth: Payer: Self-pay | Admitting: Internal Medicine

## 2018-06-07 MED ORDER — METFORMIN HCL ER 500 MG PO TB24: 500.0000 mg | ORAL_TABLET | Freq: Every day | ORAL | 0 refills | Status: DC

## 2018-06-14 ENCOUNTER — Ambulatory Visit: Payer: Medicare Other | Admitting: Pharmacist

## 2018-06-28 ENCOUNTER — Ambulatory Visit: Payer: Medicare Other | Admitting: Pharmacist

## 2018-07-16 ENCOUNTER — Other Ambulatory Visit: Payer: Self-pay | Admitting: Internal Medicine

## 2018-07-16 ENCOUNTER — Telehealth: Payer: Self-pay | Admitting: Internal Medicine

## 2018-07-16 DIAGNOSIS — E0840 Diabetes mellitus due to underlying condition with diabetic neuropathy, unspecified: Secondary | ICD-10-CM

## 2018-07-16 NOTE — Telephone Encounter (Signed)
Patient called to request a refill on their medication, they were instructed to hang up and contact their pharmacy directly to order their prescriptions even if they are out of refills.  

## 2018-07-21 NOTE — Telephone Encounter (Signed)
1) Medication(s) Requested (by name): glucose blood (TRUE METRIX BLOOD GLUCOSE TEST) test strip  2) Pharmacy of Choice: [facility] [address]  3) Special Requests: Pt states the pharmacy stated for him to call here first pt wanted 3 month supply  Approved medications will be sent to the pharmacy, we will reach out if there is an issue.  Requests made after 3pm may not be addressed until the following business day!  If a patient is unsure of the name of the medication(s) please note and ask patient to call back when they are able to provide all info, do not send to responsible party until all information is available!

## 2018-07-22 MED ORDER — GLUCOSE BLOOD VI STRP: ORAL_STRIP | 2 refills | Status: DC

## 2018-07-26 ENCOUNTER — Ambulatory Visit: Payer: Medicare Other | Attending: Internal Medicine | Admitting: Internal Medicine

## 2018-07-26 ENCOUNTER — Other Ambulatory Visit: Payer: Self-pay

## 2018-07-26 DIAGNOSIS — I10 Essential (primary) hypertension: Secondary | ICD-10-CM

## 2018-07-26 DIAGNOSIS — R0981 Nasal congestion: Secondary | ICD-10-CM | POA: Diagnosis not present

## 2018-07-26 DIAGNOSIS — E1140 Type 2 diabetes mellitus with diabetic neuropathy, unspecified: Secondary | ICD-10-CM

## 2018-07-26 DIAGNOSIS — J4540 Moderate persistent asthma, uncomplicated: Secondary | ICD-10-CM

## 2018-07-26 DIAGNOSIS — E0840 Diabetes mellitus due to underlying condition with diabetic neuropathy, unspecified: Secondary | ICD-10-CM

## 2018-07-26 DIAGNOSIS — E785 Hyperlipidemia, unspecified: Secondary | ICD-10-CM

## 2018-07-26 MED ORDER — FLUTICASONE-SALMETEROL 250-50 MCG/DOSE IN AEPB: 1.0000 | INHALATION_SPRAY | Freq: Two times a day (BID) | RESPIRATORY_TRACT | 6 refills | Status: DC

## 2018-07-26 MED ORDER — FLUTICASONE PROPIONATE 50 MCG/ACT NA SUSP: 2.0000 | Freq: Every day | NASAL | 6 refills | Status: DC

## 2018-07-26 MED ORDER — MONTELUKAST SODIUM 10 MG PO TABS: 10.0000 mg | ORAL_TABLET | Freq: Every day | ORAL | 3 refills | Status: DC

## 2018-07-26 MED ORDER — GLUCOSE BLOOD VI STRP: ORAL_STRIP | 12 refills | Status: DC

## 2018-07-26 MED ORDER — PRAVASTATIN SODIUM 20 MG PO TABS: 20.0000 mg | ORAL_TABLET | Freq: Every day | ORAL | 1 refills | Status: DC

## 2018-07-26 MED ORDER — ALBUTEROL SULFATE HFA 108 (90 BASE) MCG/ACT IN AERS: 1.0000 | INHALATION_SPRAY | Freq: Four times a day (QID) | RESPIRATORY_TRACT | 6 refills | Status: DC | PRN

## 2018-07-26 NOTE — Progress Notes (Signed)
Virtual Visit via Telephone Note  I connected with [employer] on 07/26/18 at  8:30 AM EDT by telephone from my office and verified that I am speaking with the correct person using two identifiers.  This encounter was conducted between myself and the patient.   I discussed the limitations, risks, security and privacy concerns of performing an evaluation and management service by telephone and the availability of in person appointments. I also discussed with the patient that there may be a patient responsible charge related to this service. The patient expressed understanding and agreed to proceed.   History of Present Illness:  Asthma: c/o light cough,congested in sinuses, rhinorrhea. No fever.  Using Advair in mornings and sometimes later in the day. Uses Albuterol MDI BID and Duo neb BID.  Requests refill on Singulair and Flonase.  He is wanting a refill on prednisone to keep on hand to use when needed  HTN: checks BP once a day.  Last 135/70. Limits salt.  No LE edema/CP.  He limits salt in the foods.  Reports compliance with lisinopril/HCTZ and amlodipine  DM: checks BS daily.  Gives range of 100-110.  Needs prescription to be sent to his pharmacy for Prodigit test stripes a 90 day supply.  Reports compliance with metformin.   HL: I asked about whether he was still taking Pravachol as I see it on his med list last filled in May 2018 by Dr. Janne Napoleon.  Patient states that he was still taking and request refill.  He has not had a lipid profile in over a year.  We will plan to do lipid profile and LFTs   Observations/Objective: No direct observations done as this was a phone encounter  Assessment and Plan: 1. Type 2 diabetes mellitus with diabetic neuropathy, without long-term current use of insulin ([place]) Reported blood sugars are at goal.  He will continue metformin.  Encourage him to continue healthy eating habits.  We will send prescription to his pharmacy for the requested test  strips  2. Sinus congestion - fluticasone (FLONASE) 50 MCG/ACT nasal spray; Place 2 sprays into both nostrils daily.  Dispense: 16 g; Refill: 6  3. Moderate persistent asthma, unspecified whether complicated Based on history he does not sound to be having any significant flare at this time.  However I did advise that he use the Advair twice a day as prescribed as it may help to decrease need for use of the albuterol inhaler - montelukast (SINGULAIR) 10 MG tablet; Take 1 tablet (10 mg total) by mouth at bedtime.  Dispense: 90 tablet; Refill: 3 - albuterol (PROAIR HFA) 108 (90 Base) MCG/ACT inhaler; Inhale 1-2 puffs into the lungs every 6 (six) hours as needed for wheezing or shortness of breath.  Dispense: 1 Inhaler; Refill: 6 - Fluticasone-Salmeterol (ADVAIR DISKUS) 250-50 MCG/DOSE AEPB; Inhale 1 puff into the lungs 2 (two) times daily.  Dispense: 1 each; Refill: 6  4. Essential hypertension Continue amlodipine and lisinopril/HCTZ.  Continue home blood pressure monitoring  5. Hyperlipidemia, unspecified hyperlipidemia type - pravastatin (PRAVACHOL) 20 MG tablet; Take 1 tablet (20 mg total) by mouth at bedtime.  Dispense: 90 tablet; Refill: 1 We will put in future labs for lipid profile and LFTs  Follow Up Instructions:    I discussed the assessment and treatment plan with the patient. The patient was provided an opportunity to ask questions and all were answered. The patient agreed with the plan and demonstrated an understanding of the instructions.   The patient  was advised to call back or seek an in-person evaluation if the symptoms worsen or if the condition fails to improve as anticipated.  I provided 13 minutes of non-face-to-face time during this encounter.   Karle Plumber, MD

## 2018-07-26 NOTE — Progress Notes (Signed)
Pt states he is having problem with his asthma   Pt is requesting prednisone. Pt staes the prednisone helps with asthma

## 2018-07-29 ENCOUNTER — Other Ambulatory Visit: Payer: Self-pay | Admitting: Internal Medicine

## 2018-07-29 DIAGNOSIS — J4541 Moderate persistent asthma with (acute) exacerbation: Secondary | ICD-10-CM

## 2018-07-29 NOTE — Telephone Encounter (Signed)
1) Medication(s) Requested (by name): gabapentin (NEURONTIN) 300 MG capsule   2) Pharmacy of Choice: [facility], [place]. 3) Special Requests: Pt states [facility]  Approved medications will be sent to the pharmacy, we will reach out if there is an issue.  Requests made after 3pm may not be addressed until the following business day!  If a patient is unsure of the name of the medication(s) please note and ask patient to call back when they are able to provide all info, do not send to responsible party until all information is available!

## 2018-07-30 ENCOUNTER — Other Ambulatory Visit: Payer: Self-pay | Admitting: Pharmacist

## 2018-07-30 ENCOUNTER — Telehealth: Payer: Self-pay | Admitting: Internal Medicine

## 2018-07-30 DIAGNOSIS — J4541 Moderate persistent asthma with (acute) exacerbation: Secondary | ICD-10-CM

## 2018-07-30 MED ORDER — PREDNISONE 10 MG PO TABS: ORAL_TABLET | ORAL | 0 refills | Status: DC

## 2018-07-30 MED ORDER — GLUCOSE BLOOD VI STRP: ORAL_STRIP | 11 refills | Status: DC

## 2018-07-30 MED ORDER — GABAPENTIN 300 MG PO CAPS: 300.0000 mg | ORAL_CAPSULE | Freq: Three times a day (TID) | ORAL | 1 refills | Status: DC

## 2018-07-30 NOTE — Telephone Encounter (Signed)
Pt called in wanting to see if nurse could give him a prednisone shot for allergies please follow up

## 2018-07-30 NOTE — Telephone Encounter (Signed)
Patients call returned.  Patient identified by name and date of birth.Patient told that a prescription for prednisone was sent to [facility]

## 2018-07-30 NOTE — Telephone Encounter (Signed)
Patients call returned.  Patient identified by name and date of birth. Patient states that he is suffering with allergies.  Patient states that he gets relief for his allergies and COPD when he receives a prednisone shot.  Patient states he needs it today so he can feel better.  Forwarded it Dr Wynetta Emery

## 2018-07-30 NOTE — Telephone Encounter (Signed)
Prednisone rxn sent to [facility].

## 2018-07-30 NOTE — Addendum Note (Signed)
Addended by: Karle Plumber B on: 07/30/2018 06:29 PM   Modules accepted: Orders

## 2018-08-04 ENCOUNTER — Other Ambulatory Visit: Payer: Self-pay | Admitting: Pharmacist

## 2018-08-04 MED ORDER — METFORMIN HCL ER 500 MG PO TB24: ORAL_TABLET | ORAL | 2 refills | Status: DC

## 2018-08-16 ENCOUNTER — Telehealth: Payer: Self-pay | Admitting: *Deleted

## 2018-08-16 NOTE — Telephone Encounter (Signed)
08/16/2018  LMOM @ 2:09pm, re: follow up appointment. Christian [name]

## 2018-08-19 ENCOUNTER — Ambulatory Visit: Payer: Medicare Other | Attending: Internal Medicine | Admitting: Physician Assistant

## 2018-08-19 ENCOUNTER — Other Ambulatory Visit: Payer: Self-pay

## 2018-08-19 VITALS — BP 157/90 | HR 82 | Temp 98.5°F | Resp 16 | Wt 181.6 lb

## 2018-08-19 DIAGNOSIS — J4541 Moderate persistent asthma with (acute) exacerbation: Secondary | ICD-10-CM | POA: Diagnosis not present

## 2018-08-19 DIAGNOSIS — I10 Essential (primary) hypertension: Secondary | ICD-10-CM

## 2018-08-19 DIAGNOSIS — E1140 Type 2 diabetes mellitus with diabetic neuropathy, unspecified: Secondary | ICD-10-CM | POA: Diagnosis not present

## 2018-08-19 DIAGNOSIS — R61 Generalized hyperhidrosis: Secondary | ICD-10-CM | POA: Diagnosis not present

## 2018-08-19 LAB — POCT GLYCOSYLATED HEMOGLOBIN (HGB A1C): HbA1c, POC (controlled diabetic range): 6.3 % (ref 0.0–7.0)

## 2018-08-19 LAB — GLUCOSE, POCT (MANUAL RESULT ENTRY): POC Glucose: 85 mg/dl (ref 70–99)

## 2018-08-19 MED ORDER — METHYLPREDNISOLONE ACETATE 40 MG/ML IJ SUSP
40.0000 mg | Freq: Once | INTRAMUSCULAR | Status: AC
  Administered 2018-08-19: 15:00:00 40 mg via INTRAMUSCULAR

## 2018-08-19 MED ORDER — PREDNISONE 10 MG PO TABS: ORAL_TABLET | ORAL | 0 refills | Status: DC

## 2018-08-19 MED ORDER — METHYLPREDNISOLONE ACETATE 40 MG/ML IJ SUSP
40.0000 mg | Freq: Once | INTRAMUSCULAR | Status: AC
  Administered 2018-08-19: 40 mg via INTRAMUSCULAR

## 2018-08-19 MED ORDER — METHYLPREDNISOLONE SODIUM SUCC 125 MG IJ SOLR: 125.0000 mg | Freq: Once | INTRAMUSCULAR | Status: DC

## 2018-08-19 MED ORDER — METHYLPREDNISOLONE ACETATE 80 MG/ML IJ SUSP: 80.0000 mg | Freq: Once | INTRAMUSCULAR | Status: DC

## 2018-08-19 NOTE — Progress Notes (Signed)
Patient ID: Christian [name], male   DOB: 12-22-1960, 58 y.o.   MRN: [id number]   Christian [name], is a 58 y.o. male  GXQ:[id number]  [id number]:[id number]  DOB - [birth year]  Subjective:  Chief Complaint and HPI: Christian [name] is a 58 y.o. male here today with uncontrolled asthma for about 2 weeks.  It was better at the beginning of the month when he was on a short course of prednisone but worsened again when he finished it.  No fever.  Cough associated with wheezing.  No URI s/sx.   Also c/o awakening with cold hands, feeling sweaty on his head for a while.  Wants to be checked for anemia.    ROS:   Constitutional:  No f/c, No night sweats, No unexplained weight loss. EENT:  No vision changes, No blurry vision, No hearing changes. No mouth, throat, or ear problems.  Respiratory: No cough, + SOB/wheezing Cardiac: No CP, no palpitations GI:  No abd pain, No N/V/D. GU: No Urinary s/sx Musculoskeletal: No joint pain Neuro: No headache, no dizziness, no motor weakness.  Skin: No rash Endocrine:  No polydipsia. No polyuria.  Psych: Denies SI/HI  No problems updated.  ALLERGIES: Allergies  Allergen Reactions  . Phenergan [Promethazine Hcl] Itching and Other (See Comments)    burning    PAST MEDICAL HISTORY: Past Medical History:  Diagnosis Date  . Allergy   . Arthritis    "left knee" (01/22/2016)  . Asthma   . Chronic lower back pain   . COPD (chronic obstructive pulmonary disease) ([place])   . Dyspnea    with extertion  . High cholesterol   . Hypertension   . Neuropathy   . OSA on CPAP   . Secondhand smoke exposure   . Sleep apnea    wears CPAP, not wearing now  for months  . Type II diabetes mellitus ([place]) dx'd 01/17/2016    MEDICATIONS AT HOME: Prior to Admission medications   Medication Sig Start Date End Date Taking? Authorizing Provider  albuterol (PROAIR HFA) 108 (90 Base) MCG/ACT inhaler Inhale 1-2 puffs into the lungs every 6 (six) hours as needed for wheezing or shortness of  breath. 07/26/18   Ladell Pier, MD  amLODipine (NORVASC) 10 MG tablet Take 1 tablet (10 mg total) by mouth daily. 05/28/18   Ladell Pier, MD  aspirin EC 325 MG tablet Take 1 tablet (325 mg total) by mouth 2 (two) times daily. 08/06/17   Leandrew Koyanagi, MD  Blood Glucose Monitoring Suppl (ONE TOUCH ULTRA 2) w/Device KIT Use as directed 3 times daily E11.9 03/31/17   Ladell Pier, MD  Blood Glucose Monitoring Suppl Supplies MISC Prodigy Autocode test stripes - check blood sugar once daily 05/27/17   Ladell Pier, MD  diclofenac sodium (VOLTAREN) 1 % GEL Apply 2 g 4 (four) times daily topically. Patient taking differently: Apply 2 g topically 4 (four) times daily as needed (for pain.).  03/03/17   Leandrew Koyanagi, MD  fluticasone (FLONASE) 50 MCG/ACT nasal spray Place 2 sprays into both nostrils daily. 07/26/18   Ladell Pier, MD  Fluticasone-Salmeterol (ADVAIR DISKUS) 250-50 MCG/DOSE AEPB Inhale 1 puff into the lungs 2 (two) times daily. 07/26/18   Ladell Pier, MD  gabapentin (NEURONTIN) 300 MG capsule Take 1 capsule (300 mg total) by mouth 3 (three) times daily. 07/30/18   Ladell Pier, MD  glucose blood (PRODIGY NO CODING BLOOD GLUC) test strip Use as instructed 07/30/18  Ladell Pier, MD  glucose blood (TRUE METRIX BLOOD GLUCOSE TEST) test strip Check Blood sugar 1-2 x a day.  Has Prodigy Auto code meter 07/22/18   Karle Plumber B, MD  glucose blood test strip Use once daily to check blood sugars.  Prodigit test stripes 07/26/18   Ladell Pier, MD  ipratropium-albuterol (DUONEB) 0.5-2.5 (3) MG/3ML SOLN Take 3 mLs by nebulization every 4 (four) hours as needed. 05/28/18   Ladell Pier, MD  lisinopril-hydrochlorothiazide (PRINZIDE,ZESTORETIC) 10-12.5 MG tablet Take 1 tablet by mouth daily. 05/28/18   Ladell Pier, MD  metFORMIN (GLUCOPHAGE-XR) 500 MG 24 hr tablet Take 1 tablet by mouth once daily with breakfast 08/04/18   Ladell Pier, MD   methocarbamol (ROBAXIN) 750 MG tablet Take 1 tablet (750 mg total) by mouth 2 (two) times daily as needed for muscle spasms. 08/06/17   Leandrew Koyanagi, MD  montelukast (SINGULAIR) 10 MG tablet Take 1 tablet (10 mg total) by mouth at bedtime. 07/26/18   Ladell Pier, MD  ondansetron (ZOFRAN) 4 MG tablet Take 1-2 tablets (4-8 mg total) by mouth every 8 (eight) hours as needed for nausea or vomiting. 08/06/17   Leandrew Koyanagi, MD  pravastatin (PRAVACHOL) 20 MG tablet Take 1 tablet (20 mg total) by mouth at bedtime. 07/26/18   Ladell Pier, MD  predniSONE (DELTASONE) 10 MG tablet 6,5,4,3,2,1 take each days dose in the morning with food. 08/19/18   Argentina Donovan, PA-C  senna-docusate (SENOKOT S) 8.6-50 MG tablet Take 1 tablet by mouth at bedtime as needed. 08/06/17   Leandrew Koyanagi, MD  TRUEPLUS LANCETS 28G MISC Check glucose 2 times a day 02/01/16   Lottie Mussel T, MD     Objective:  EXAM:   Vitals:   08/19/18 1400  BP: (!) 157/90  Pulse: 82  Resp: 16  Temp: 98.5 F (36.9 C)  TempSrc: Oral  SpO2: 94%  Weight: 181 lb 9.6 oz (82.4 kg)    General appearance : A&OX3. NAD. Non-toxic-appearing HEENT: Atraumatic and Normocephalic.  PERRLA. EOM intact.   Neck: supple, no JVD. No cervical lymphadenopathy. No thyromegaly Chest/Lungs:  Breathing-non-labored, fair air movement.  There are no rales or rhonchi.  Mild to moderate wheezing throughout with forced expiration.   CVS: S1 S2 regular, no murmurs, gallops, rubs  Abdomen: Bowel sounds present, Non tender and not distended with no gaurding, rigidity or rebound. Extremities: Bilateral Lower Ext shows no edema, both legs are warm to touch with = pulse throughout Neurology:  CN II-XII grossly intact, Non focal.   Psych:  TP linear. J/I WNL. Normal speech. Appropriate eye contact and affect.  Skin:  No Rash  Data Review Lab Results  Component Value Date   HGBA1C 6.3 08/19/2018   HGBA1C 5.9 (H) 08/04/2017   HGBA1C 5.7 (H) 05/05/2017      Assessment & Plan   1. Moderate persistent asthma with acute exacerbation Make appt with pulmonologist due to poorly controlled asthma - methylPREDNISolone sodium succinate (SOLU-MEDROL) 125 mg/2 mL injection 125 mg - predniSONE (DELTASONE) 10 MG tablet; 6,5,4,3,2,1 take each days dose in the morning with food.  Dispense: 21 tablet; Refill: 0  2. Type 2 diabetes mellitus with diabetic neuropathy, unspecified whether long term insulin use (HCC) Uncontrolled-work on diet, continue current regimen - POCT glucose (manual entry) - HgB A1c  3. Essential hypertension Hasn't taken meds today.  Check BP OOO 3 times/week when back on meds and bring to next visit  4. Night sweats/cold fingers - TSH - Vitamin D, 25-hydroxy - CBC with Differential/Platelet   Patient have been counseled extensively about nutrition and exercise  Return in about 3 months (around 11/18/2018) for with DR Wynetta Emery for chronic medical conditions.  The patient was given clear instructions to go to ER or return to medical center if symptoms don't improve, worsen or new problems develop. The patient verbalized understanding. The patient was told to call to get lab results if they haven't heard anything in the next week.     Freeman Caldron, PA-C [facility], [place]   08/19/2018, 2:17 PM

## 2018-08-19 NOTE — Patient Instructions (Signed)
Make appointment with your pulmonologist

## 2018-08-20 ENCOUNTER — Other Ambulatory Visit: Payer: Self-pay | Admitting: Physician Assistant

## 2018-08-20 LAB — CBC WITH DIFFERENTIAL/PLATELET
Basophils Absolute: 0.1 10*3/uL (ref 0.0–0.2)
Basos: 1 %
EOS (ABSOLUTE): 0.5 10*3/uL — ABNORMAL HIGH (ref 0.0–0.4)
Eos: 6 %
Hematocrit: 48.8 % (ref 37.5–51.0)
Hemoglobin: 16.2 g/dL (ref 13.0–17.7)
Immature Grans (Abs): 0 10*3/uL (ref 0.0–0.1)
Immature Granulocytes: 0 %
Lymphocytes Absolute: 2.1 10*3/uL (ref 0.7–3.1)
Lymphs: 28 %
MCH: 29.2 pg (ref 26.6–33.0)
MCHC: 33.2 g/dL (ref 31.5–35.7)
MCV: 88 fL (ref 79–97)
Monocytes Absolute: 0.6 10*3/uL (ref 0.1–0.9)
Monocytes: 7 %
Neutrophils Absolute: 4.5 10*3/uL (ref 1.4–7.0)
Neutrophils: 58 %
Platelets: 266 10*3/uL (ref 150–450)
RBC: 5.55 x10E6/uL (ref 4.14–5.80)
RDW: 13.3 % (ref 11.6–15.4)
WBC: 7.7 10*3/uL (ref 3.4–10.8)

## 2018-08-20 LAB — VITAMIN D 25 HYDROXY (VIT D DEFICIENCY, FRACTURES): Vit D, 25-Hydroxy: 15 ng/mL — ABNORMAL LOW (ref 30.0–100.0)

## 2018-08-20 LAB — TSH: TSH: 3.55 u[IU]/mL (ref 0.450–4.500)

## 2018-08-20 MED ORDER — VITAMIN D (ERGOCALCIFEROL) 1.25 MG (50000 UNIT) PO CAPS: 50000.0000 [IU] | ORAL_CAPSULE | ORAL | 0 refills | Status: DC

## 2018-08-23 ENCOUNTER — Other Ambulatory Visit: Payer: Self-pay | Admitting: *Deleted

## 2018-08-23 MED ORDER — VITAMIN D (ERGOCALCIFEROL) 1.25 MG (50000 UNIT) PO CAPS: 50000.0000 [IU] | ORAL_CAPSULE | ORAL | 0 refills | Status: DC

## 2018-08-23 NOTE — Telephone Encounter (Signed)
Pt request his Vitamin D be sent to [facility]. He states he is not sure why the prednisone went there and not the vitamin D. Pt sounded frustrated. Medications were sent to pharmacy he requested. Pt advised to f/u in 3-4 months for lab to recheck vitamin-D.

## 2018-09-07 ENCOUNTER — Other Ambulatory Visit: Payer: Self-pay

## 2018-09-07 ENCOUNTER — Ambulatory Visit: Payer: Medicare Other

## 2018-09-07 ENCOUNTER — Ambulatory Visit: Payer: Medicare Other | Admitting: Orthopaedic Surgery

## 2018-09-07 ENCOUNTER — Encounter: Payer: Self-pay | Admitting: Orthopaedic Surgery

## 2018-09-07 DIAGNOSIS — M25462 Effusion, left knee: Secondary | ICD-10-CM

## 2018-09-07 DIAGNOSIS — Z96652 Presence of left artificial knee joint: Secondary | ICD-10-CM

## 2018-09-07 MED ORDER — LIDOCAINE HCL 1 % IJ SOLN
2.0000 mL | INTRAMUSCULAR | Status: AC | PRN
  Administered 2018-09-07: 2 mL

## 2018-09-07 MED ORDER — BUPIVACAINE HCL 0.5 % IJ SOLN
2.0000 mL | INTRAMUSCULAR | Status: AC | PRN
  Administered 2018-09-07: 2 mL via INTRA_ARTICULAR

## 2018-09-07 NOTE — Addendum Note (Signed)
Addended by: Precious Bard on: 09/07/2018 09:45 AM   Modules accepted: Orders

## 2018-09-07 NOTE — Progress Notes (Signed)
Office Visit Note   Patient: Christian [name]           Date of Birth: 1960-08-29           MRN: [id number] Visit Date: 09/07/2018              Requested by: Ladell Pier, MD [address] [place], [place] [address] PCP: Ladell Pier, MD   Assessment & Plan: Visit Diagnoses:  1. Effusion of left knee joint   2. Presence of left artificial knee joint     Plan: Christian [name] is 13 months status post left total knee replacement with recent development of joint effusion within the last month likely due to overuse.  He does have some slight laxity of the LCL to varus stress which may contribute to this but he states that he does not feel any instability or giving way.  We discussed multiple treatment options and he elected to undergo aspiration and injection with Toradol as well as relative rest for the next month or so.  We will send the fluid for analysis.  Suspicion for infection is low.  If this resolves on its own he will need to come back to see Christian [name] in about a year for his 2-year visit.  Follow-Up Instructions: Return in about 1 year (around 09/07/2019).   Orders:  Orders Placed This Encounter  Procedures  . Large Joint Inj: L knee  . XR KNEE 3 VIEW LEFT   No orders of the defined types were placed in this encounter.     Procedures: Large Joint Inj: L knee on 09/07/2018 9:02 AM Details: 22 G needle Medications: 2 mL bupivacaine 0.5 %; 2 mL lidocaine 1 % Aspirate: 35 mL blood-tinged; sent for lab analysis Outcome: tolerated well, no immediate complications Patient was prepped and draped in the usual sterile fashion.       Clinical Data: No additional findings.   Subjective: Chief Complaint  Patient presents with  . Left Knee - Pain, Edema    Christian [name] is a 58 year old gentleman who is 13 months status post left total knee replacement.  He has done very well overall and his very happy with his recovery.  He states that he was able to play some basketball with his  grandkids and he has not been able to do this in quite some time.  For the last month he states that he did have some increased swelling in his left knee.  He denies any associated pain.  He denies any constitutional symptoms.  He denies any injuries.  Denies any numbness and tingling.  He has no other complaints.   Review of Systems  Constitutional: Negative.   All other systems reviewed and are negative.    Objective: Vital Signs: There were no vitals taken for this visit.  Physical Exam Vitals signs and nursing note reviewed.  Constitutional:      Appearance: He is well-developed.  Pulmonary:     Effort: Pulmonary effort is normal.  Abdominal:     Palpations: Abdomen is soft.  Skin:    General: Skin is warm.  Neurological:     Mental Status: He is alert and oriented to person, place, and time.  Psychiatric:        Behavior: Behavior normal.        Thought Content: Thought content normal.        Judgment: Judgment normal.     Ortho Exam Left knee exam - well healed surgical scar -  excellent ROM - LCL is slightly lax to varus stress but he doesn't endorse instability and giving way - moderate joint effusion  Specialty Comments:  No specialty comments available.  Imaging: Xr Knee 3 View Left  Result Date: 09/07/2018 Stable left total knee replacement without complication.    PMFS History: Patient Active Problem List   Diagnosis Date Noted  . Presence of left artificial knee joint 08/24/2017  . Moderate persistent asthma without complication 41/74/0814  . Primary osteoarthritis of left knee 02/23/2017  . Chronic pain syndrome 04/02/2016  . Intrinsic asthma 03/25/2016  . Essential hypertension 01/23/2016  . Hyperlipidemia 01/23/2016  . Secondhand smoke exposure 01/23/2016  . Diabetes mellitus with neuropathy ([place]) 01/23/2016   Past Medical History:  Diagnosis Date  . Allergy   . Arthritis    "left knee" (01/22/2016)  . Asthma   . Chronic lower back pain    . COPD (chronic obstructive pulmonary disease) ([place])   . Dyspnea    with extertion  . High cholesterol   . Hypertension   . Neuropathy   . OSA on CPAP   . Secondhand smoke exposure   . Sleep apnea    wears CPAP, not wearing now  for months  . Type II diabetes mellitus ([place]) dx'd 01/17/2016    Family History  Problem Relation Age of Onset  . Hypertension Mother   . Hypertension Father   . Heart Problems Brother        Heart transplant, pt does not know cause   . Rectal cancer Brother   . Colon cancer Neg Hx   . Esophageal cancer Neg Hx   . Stomach cancer Neg Hx     Past Surgical History:  Procedure Laterality Date  . ANTERIOR CERVICAL DECOMP/DISCECTOMY FUSION  ~ 1995   "titanium"  . APPENDECTOMY  ~ 1977  . BACK SURGERY     x 2  L 4disectomy  . COLONOSCOPY W/ POLYPECTOMY    . INGUINAL HERNIA REPAIR Right ~ 2000  . LUMBAR DISC SURGERY  ~ 2000; ~2006  . NASAL SINUS SURGERY  ~ 2007  . TOTAL KNEE ARTHROPLASTY Left 08/06/2017   Procedure: LEFT TOTAL KNEE ARTHROPLASTY;  Surgeon: Leandrew Koyanagi, MD;  Location: [facility];  Service: Orthopedics;  Laterality: Left;   Social History   Occupational History  . Not on file  Tobacco Use  . Smoking status: Passive Smoke Exposure - Never Smoker  . Smokeless tobacco: Never Used  . Tobacco comment: passive cig smoking at work  1994- 2012  Substance and Sexual Activity  . Alcohol use: No  . Drug use: No  . Sexual activity: Yes

## 2018-09-08 LAB — SYNOVIAL CELL COUNT + DIFF, W/ CRYSTALS
Basophils, %: 0 %
Eosinophils-Synovial: 1 % (ref 0–2)
Lymphocytes-Synovial Fld: 59 % (ref 0–74)
Monocyte/Macrophage: 25 % (ref 0–69)
Neutrophil, Synovial: 15 % (ref 0–24)
Synoviocytes, %: 0 % (ref 0–15)
WBC, Synovial: 190 cells/uL — ABNORMAL HIGH (ref <150)

## 2018-09-08 NOTE — Progress Notes (Signed)
No signs of infection.  Just some inflammatory fluid.

## 2018-09-09 ENCOUNTER — Encounter (INDEPENDENT_AMBULATORY_CARE_PROVIDER_SITE_OTHER): Payer: Self-pay

## 2018-09-09 ENCOUNTER — Ambulatory Visit: Payer: Medicare Other | Attending: Internal Medicine | Admitting: Internal Medicine

## 2018-09-09 ENCOUNTER — Other Ambulatory Visit: Payer: Self-pay

## 2018-09-09 DIAGNOSIS — E785 Hyperlipidemia, unspecified: Secondary | ICD-10-CM

## 2018-09-09 DIAGNOSIS — R252 Cramp and spasm: Secondary | ICD-10-CM

## 2018-09-09 NOTE — Progress Notes (Signed)
Virtual Visit via Telephone Note Due to current restrictions/limitations of in-office visits due to the COVID-19 pandemic, this scheduled clinical appointment was converted to a telehealth visit  I connected with Christian [name] [employer] on 09/09/18 at 2:12 p.m EDT by telephone and verified that I am speaking with the correct person using two identifiers.  I am in my office.  The patient is at home.  Only the patient and myself participated in this encounter.  I discussed the limitations, risks, security and privacy concerns of performing an evaluation and management service by telephone and the availability of in person appointments. I also discussed with the patient that there may be a patient responsible charge related to this service. The patient expressed understanding and agreed to proceed.  History of Present Illness: History of HTN, DM type II, HL, moderate persistent asthma, vit D def  Pt c/o cramps in hips, toes and hands x 2 wks.  Usually occurs in middle of nights.  He has to get up and walk around when he gets cramps in legs at nights.  Had bad cramps in hands this past wkend while trying to eat some crabs.  Had to delay eating the crabs.    He confirms that he did get the prescription for Pravachol and is taking it.  On her last tele-visit at the end of March, I had asked patient whether he was taking the Pravachol or not because the last prescription was written in May 2018 by Dr. Janne Napoleon.  He reported that he was taking it and requested a prescription even though there were no recent refills  Observations/Objective: No direct observation done as this was a telephone encounter  Assessment and Plan: 1. Muscle cramps Likely due to pravastatin.  I recommend taking half a tablet of the 20 mg every other night to see whether the cramps can decrease.  If they do not we can try changing him to Crestor or Zetia - Comprehensive metabolic panel; Future  2. Hyperlipidemia, unspecified  hyperlipidemia type - Lipid panel; Future - Comprehensive metabolic panel; Future   Follow Up Instructions: As previously scheduled   I discussed the assessment and treatment plan with the patient. The patient was provided an opportunity to ask questions and all were answered. The patient agreed with the plan and demonstrated an understanding of the instructions.   The patient was advised to call back or seek an in-person evaluation if the symptoms worsen or if the condition fails to improve as anticipated.  I provided 9 minutes of non-face-to-face time during this encounter.   Karle Plumber, MD

## 2018-09-09 NOTE — Progress Notes (Signed)
Pt states he has to walk in order to get the cramps out his leg   Pt states mother day they had crabs and his hand locked up on him

## 2018-09-14 ENCOUNTER — Ambulatory Visit: Payer: Medicare Other | Attending: Family Medicine

## 2018-09-14 ENCOUNTER — Other Ambulatory Visit: Payer: Self-pay

## 2018-09-14 DIAGNOSIS — E785 Hyperlipidemia, unspecified: Secondary | ICD-10-CM

## 2018-09-14 DIAGNOSIS — R252 Cramp and spasm: Secondary | ICD-10-CM

## 2018-09-15 LAB — LIPID PANEL
Chol/HDL Ratio: 2.7 ratio (ref 0.0–5.0)
Cholesterol, Total: 167 mg/dL (ref 100–199)
HDL: 63 mg/dL (ref >39)
LDL Calculated: 95 mg/dL (ref 0–99)
Triglycerides: 43 mg/dL (ref 0–149)
VLDL Cholesterol Cal: 9 mg/dL (ref 5–40)

## 2018-09-15 LAB — COMPREHENSIVE METABOLIC PANEL
ALT: 16 IU/L (ref 0–44)
AST: 15 IU/L (ref 0–40)
Albumin/Globulin Ratio: 1.7 (ref 1.2–2.2)
Albumin: 3.3 g/dL — ABNORMAL LOW (ref 3.8–4.9)
Alkaline Phosphatase: 30 IU/L — ABNORMAL LOW (ref 39–117)
BUN/Creatinine Ratio: 11 (ref 9–20)
BUN: 14 mg/dL (ref 6–24)
Bilirubin Total: 0.8 mg/dL (ref 0.0–1.2)
CO2: 23 mmol/L (ref 20–29)
Calcium: 8.3 mg/dL — ABNORMAL LOW (ref 8.7–10.2)
Chloride: 104 mmol/L (ref 96–106)
Creatinine, Ser: 1.28 mg/dL — ABNORMAL HIGH (ref 0.76–1.27)
GFR calc Af Amer: 71 mL/min/{1.73_m2} (ref >59)
GFR calc non Af Amer: 61 mL/min/{1.73_m2} (ref >59)
Globulin, Total: 1.9 g/dL (ref 1.5–4.5)
Glucose: 122 mg/dL — ABNORMAL HIGH (ref 65–99)
Potassium: 3.8 mmol/L (ref 3.5–5.2)
Sodium: 139 mmol/L (ref 134–144)
Total Protein: 5.2 g/dL — ABNORMAL LOW (ref 6.0–8.5)

## 2018-10-06 ENCOUNTER — Ambulatory Visit (INDEPENDENT_AMBULATORY_CARE_PROVIDER_SITE_OTHER): Payer: Medicare Other | Admitting: Primary Care

## 2018-10-06 ENCOUNTER — Telehealth: Payer: Self-pay | Admitting: Pulmonary Disease

## 2018-10-06 ENCOUNTER — Other Ambulatory Visit: Payer: Self-pay

## 2018-10-06 ENCOUNTER — Other Ambulatory Visit (INDEPENDENT_AMBULATORY_CARE_PROVIDER_SITE_OTHER): Payer: Medicare Other

## 2018-10-06 ENCOUNTER — Encounter: Payer: Self-pay | Admitting: Primary Care

## 2018-10-06 DIAGNOSIS — J4540 Moderate persistent asthma, uncomplicated: Secondary | ICD-10-CM

## 2018-10-06 LAB — CBC WITH DIFFERENTIAL/PLATELET
Basophils Absolute: 0 10*3/uL (ref 0.0–0.1)
Basophils Relative: 0.2 % (ref 0.0–3.0)
Eosinophils Absolute: 0.4 10*3/uL (ref 0.0–0.7)
Eosinophils Relative: 7.1 % — ABNORMAL HIGH (ref 0.0–5.0)
HCT: 42.9 % (ref 39.0–52.0)
Hemoglobin: 14.2 g/dL (ref 13.0–17.0)
Lymphocytes Relative: 29.5 % (ref 12.0–46.0)
Lymphs Abs: 1.6 10*3/uL (ref 0.7–4.0)
MCHC: 33.2 g/dL (ref 30.0–36.0)
MCV: 89.6 fl (ref 78.0–100.0)
Monocytes Absolute: 0.6 10*3/uL (ref 0.1–1.0)
Monocytes Relative: 10.3 % (ref 3.0–12.0)
Neutro Abs: 2.8 10*3/uL (ref 1.4–7.7)
Neutrophils Relative %: 52.9 % (ref 43.0–77.0)
Platelets: 255 10*3/uL (ref 150.0–400.0)
RBC: 4.79 Mil/uL (ref 4.22–5.81)
RDW: 13.7 % (ref 11.5–15.5)
WBC: 5.3 10*3/uL (ref 4.0–10.5)

## 2018-10-06 MED ORDER — PREDNISONE 10 MG PO TABS: ORAL_TABLET | ORAL | 0 refills | Status: DC

## 2018-10-06 NOTE — Progress Notes (Signed)
Virtual Visit via Telephone Note  I connected with [employer] Christian [name] on 10/06/18 at  3:00 PM EDT by telephone and verified that I am speaking with the correct person using two identifiers. Unable to connect to video visit.   Location: Patient: Home/car phone Provider: Office   I discussed the limitations, risks, security and privacy concerns of performing an evaluation and management service by telephone and the availability of in person appointments. I also discussed with the patient that there may be a patient responsible charge related to this service. The patient expressed understanding and agreed to proceed.   History of Present Illness: 58 year old male, passive smoke exposure. PMH significant for moderate persistent asthma, diabetes, HTN, hyperlipidemia, chronic pain syndrome. Patient of Dr. Lake Bells, last seen by pulmonary NP on 09/25/16. Maintained on Advair 250 twice.   10/06/2018 Patient complains of intermittent shortness of breath and wheezing x1 week. Occasional dry cough but not constant. Some allergy symptoms and sinus drainage. Continues Advair twice daily. Using his rescue inhaler up to three times a day. States that he does experience frequent asthma exacerbations typically treated by PCP. He has been practicing social distancing, wearing mask when outside of the house and has been avoiding large crowds. Denies fever, sick contact, flu-like symptoms.   Observations/Objective:  - No significant shortness of breath, wheezing or cough noted during phone conversation   Assessment and Plan: Patient hasn't been seen since 2018. Currently taking Advair 250 twice daily and using Albuterol rescue inhaler 3 times a day. Reports frequent flares in his symptoms. Unclear how many exacerbations patient typically has in a year.   Asthma exacerbation - Continue Advair 250 twice daily; prn Albuterol hfa  - Needs Rx prednisone taper (40mg  x 2 days, 30mg  x 2 days; 20mg  x 2 days; 10mg x 2  days) - Checking CBC with Diff and IgE - May be candidate for addition of biologic therapy (such as Xolair, dupixent or fasenra)  Follow Up Instructions: - FU in 2-4 weeks with NP    I discussed the assessment and treatment plan with the patient. The patient was provided an opportunity to ask questions and all were answered. The patient agreed with the plan and demonstrated an understanding of the instructions.   The patient was advised to call back or seek an in-person evaluation if the symptoms worsen or if the condition fails to improve as anticipated.  I provided 25 minutes of non-face-to-face time during this encounter.   Martyn Ehrich, NP

## 2018-10-06 NOTE — Patient Instructions (Addendum)
Labs (CBC with diff and IgE)  Rx: Prednisone taper (40mg  x 2 days, 30mg  x 2 days; 20mg  x 2 days; 10mg x 2 days)   Recommendations: Continue Advair - take two puffs twice daily Continue Albuterol rescue inhaler- take two puffs every 6 hours for shortness of breath/wheezing   Follow-up: 2-4 weeks in office with NP

## 2018-10-06 NOTE — Telephone Encounter (Signed)
Patient returned call. Scheduled for televisit this afternoon with NP at 3:00 by front desk. Nothing further needed at this time.

## 2018-10-06 NOTE — Progress Notes (Signed)
Reviewed, agree 

## 2018-10-06 NOTE — Telephone Encounter (Signed)
Patient has not been seen since May 2018.  LVMTCB X 1 for patient. Stated in [place] video or televisit can be offered as he has not been seen in clinic since May 2018.

## 2018-10-07 LAB — IGE: IgE (Immunoglobulin E), Serum: 273 kU/L — ABNORMAL HIGH (ref <=114)

## 2018-10-08 ENCOUNTER — Other Ambulatory Visit: Payer: Self-pay | Admitting: Internal Medicine

## 2018-10-12 ENCOUNTER — Other Ambulatory Visit: Payer: Self-pay | Admitting: Pharmacist

## 2018-10-12 MED ORDER — METFORMIN HCL 500 MG PO TABS: 500.0000 mg | ORAL_TABLET | Freq: Every day | ORAL | 0 refills | Status: DC

## 2018-10-12 NOTE — Progress Notes (Signed)
Metformin XR on backorder/recall. Will send in for regular release metformin with same dose and sig per auto-sub policy.

## 2018-10-13 ENCOUNTER — Telehealth: Payer: Self-pay | Admitting: Primary Care

## 2018-10-13 NOTE — Telephone Encounter (Signed)
Pt requesting results of labs drawn 10/06/18. IGE/CBC

## 2018-10-13 NOTE — Telephone Encounter (Signed)
IgE and Eosinophils are elevated indicating some inflammatory/allergy response. We can discuss this further during follow-up apt. Continue Advair twice daily and prn albuterol.

## 2018-10-13 NOTE — Telephone Encounter (Signed)
Attempted to call pt but unable to reach. Left message for pt to return call. 

## 2018-10-14 NOTE — Telephone Encounter (Signed)
Attempted to call pt but unable to reach. Per pt's DPR, left a detailed message of the lab results, nothing further needed.

## 2018-11-03 ENCOUNTER — Telehealth: Payer: Self-pay | Admitting: Primary Care

## 2018-11-03 NOTE — Telephone Encounter (Signed)
Pt returning call and can be reached @ [phone].Christian [name]

## 2018-11-03 NOTE — Telephone Encounter (Signed)
ATC pt, I did not get an answer and I could not leave a message. Will try back.

## 2018-11-03 NOTE — Telephone Encounter (Signed)
Spoke with pt. States that he is having issues with his CPAP machine. Reports that he has not been using like he should but started back the last few nights. When he puts it on he feels like the pressure is way to high. Our call got ended before I could tell him that we aren't the ones who manage his CPAP. He will need to contact who ever originally started him on CPAP.  LMTCB x1 for pt.

## 2018-11-04 NOTE — Telephone Encounter (Signed)
Attempted to call pt but unable to reach. Left message for pt to return call. 

## 2018-11-05 NOTE — Telephone Encounter (Signed)
LMTCB

## 2018-11-06 ENCOUNTER — Other Ambulatory Visit: Payer: Self-pay | Admitting: Internal Medicine

## 2018-11-08 NOTE — Telephone Encounter (Signed)
Attempted to call pt but unable to reach. Left message for pt to return call. Due to multiple attempts trying to reach pt without being able to do so, per triage protocol encounter will be closed.

## 2018-11-10 ENCOUNTER — Telehealth: Payer: Self-pay | Admitting: Internal Medicine

## 2018-11-10 ENCOUNTER — Other Ambulatory Visit: Payer: Self-pay | Admitting: Pharmacist

## 2018-11-10 DIAGNOSIS — E785 Hyperlipidemia, unspecified: Secondary | ICD-10-CM

## 2018-11-10 MED ORDER — PRAVASTATIN SODIUM 20 MG PO TABS: 20.0000 mg | ORAL_TABLET | Freq: Every day | ORAL | 0 refills | Status: DC

## 2018-11-10 NOTE — Telephone Encounter (Signed)
Attempted to contact patient # [phone] regarding his CPAP machine.  Message left requesting a call back to this CM # [phone].  He will need to contact the company who provided the machine,

## 2018-11-10 NOTE — Telephone Encounter (Signed)
Pt called to say he needs someone to come and adjust his air flow in his cpap machine. States it is blowing into his stomach. Pt states pulmonary told him to contact whomever brought the machine but he doesn't know who to contact or what to do. Call pt [phone].

## 2018-11-10 NOTE — Telephone Encounter (Signed)
Christian [name] will you be able to help pt with this?

## 2018-11-11 ENCOUNTER — Telehealth: Payer: Self-pay

## 2018-11-11 NOTE — Telephone Encounter (Signed)
Attempted again to contact patient # [phone] regarding his CPAP machine.  Message left requesting a call back to this CM # [phone].  He will need to contact the company who provided the machine,

## 2018-11-12 NOTE — Telephone Encounter (Addendum)
Per pt company that supplied the CPAP does not come in and change the settings on the CPAP machine.  Home ph [phone] or cell [phone].

## 2018-11-12 NOTE — Telephone Encounter (Signed)
Will forward to Jane 

## 2018-11-15 ENCOUNTER — Telehealth: Payer: Self-pay

## 2018-11-15 ENCOUNTER — Telehealth: Payer: Self-pay | Admitting: Primary Care

## 2018-11-15 NOTE — Telephone Encounter (Signed)
Patient is returning phone call. Patient phone number is [phone]. 

## 2018-11-15 NOTE — Telephone Encounter (Signed)
Spoke with patient. Patient seemed very confused. He stated that he is currently using a CPAP machine that is at least 59 years old and needs a new machine. Patient stated that he has not had a sleep study in 10 years either. When I asked him who services his machine, he stated that he didn't know. Advised him that we need a name of the DME in order to change the settings.   Reviewed patient's chart, he has never seen anyone in our clinic for OSA. Just asthma. Advised him that he will probably need to get established with one of our sleep doctors to get this taken care of. He verbalized understanding but wanted to see if Eustaquio Maize had other recommendations.   Beth, please advise. Thanks!

## 2018-11-15 NOTE — Telephone Encounter (Signed)
Call returned to patient regarding question about CPAP settings. He explained that he received his CPAP from [facility] and they are not able to assist him.  He stated that he feels bloated when he uses the machine. He said that he was not living in [place] when he had the sleep study done and received his CPAP machine.  He has not used the CPAP consistently and said that he last used it a couple of weeks ago. Informed him that this CM would discuss with Dr Joya Gaskins,   As per Dr Joya Gaskins, the patient can be scheduled an appointment with him and bring the CPAP with him to the appointment.   Call placed to the patient to schedule an appointment with Dr Joya Gaskins at [facility].  The patient has also contacted [place]  p[facility] with the same questions and thought this CM was with the pulmonary department.  Explained to him that Dr Joya Gaskins can see him on 12/01/2018 and he said he didn't want to wait that long and will contact pulmonary department again. Instructed him to call this CM back if he needs further assistance.    He has a televisit with [facility] walk in provider on 11/17/2018. He also noted that he has no COVID 19  symptoms but had testing done voluntarily and is waiting for results.

## 2018-11-15 NOTE — Telephone Encounter (Signed)
lmtcb for pt.  

## 2018-11-15 NOTE — Telephone Encounter (Signed)
Since pt has a sleep MD at [facility] and has insurance, referral back to that clinic makes sense

## 2018-11-17 ENCOUNTER — Other Ambulatory Visit: Payer: Self-pay

## 2018-11-17 ENCOUNTER — Ambulatory Visit: Payer: Medicare Other | Attending: Internal Medicine | Admitting: Physician Assistant

## 2018-11-17 DIAGNOSIS — R0981 Nasal congestion: Secondary | ICD-10-CM

## 2018-11-17 DIAGNOSIS — I10 Essential (primary) hypertension: Secondary | ICD-10-CM | POA: Diagnosis not present

## 2018-11-17 DIAGNOSIS — E785 Hyperlipidemia, unspecified: Secondary | ICD-10-CM

## 2018-11-17 DIAGNOSIS — J4541 Moderate persistent asthma with (acute) exacerbation: Secondary | ICD-10-CM

## 2018-11-17 DIAGNOSIS — J4540 Moderate persistent asthma, uncomplicated: Secondary | ICD-10-CM

## 2018-11-17 MED ORDER — AMLODIPINE BESYLATE 10 MG PO TABS: 10.0000 mg | ORAL_TABLET | Freq: Every day | ORAL | 3 refills | Status: DC

## 2018-11-17 MED ORDER — METFORMIN HCL 500 MG PO TABS: 500.0000 mg | ORAL_TABLET | Freq: Every day | ORAL | 0 refills | Status: DC

## 2018-11-17 MED ORDER — PREDNISONE 10 MG PO TABS: ORAL_TABLET | ORAL | 0 refills | Status: DC

## 2018-11-17 MED ORDER — IPRATROPIUM-ALBUTEROL 0.5-2.5 (3) MG/3ML IN SOLN: 3.0000 mL | RESPIRATORY_TRACT | 2 refills | Status: DC | PRN

## 2018-11-17 MED ORDER — MONTELUKAST SODIUM 10 MG PO TABS: 10.0000 mg | ORAL_TABLET | Freq: Every day | ORAL | 3 refills | Status: DC

## 2018-11-17 MED ORDER — CETIRIZINE HCL 10 MG PO TABS: 10.0000 mg | ORAL_TABLET | Freq: Every day | ORAL | 11 refills | Status: DC

## 2018-11-17 MED ORDER — LISINOPRIL-HYDROCHLOROTHIAZIDE 10-12.5 MG PO TABS: 1.0000 | ORAL_TABLET | Freq: Every day | ORAL | 3 refills | Status: DC

## 2018-11-17 MED ORDER — PRAVASTATIN SODIUM 20 MG PO TABS: 20.0000 mg | ORAL_TABLET | Freq: Every day | ORAL | 0 refills | Status: DC

## 2018-11-17 NOTE — Progress Notes (Signed)
Patient ID: Christian [name], male   DOB: 04-10-1961, 58 y.o.   MRN: [id number] Virtual Visit via Telephone Note  I connected with Christian [name] Junior [place]zes on 11/17/18 at  9:10 AM EDT by telephone and verified that I am speaking with the correct person using two identifiers.   I discussed the limitations, risks, security and privacy concerns of performing an evaluation and management service by telephone and the availability of in person appointments. I also discussed with the patient that there may be a patient responsible charge related to this service. The patient expressed understanding and agreed to proceed.  Patient location:  home My Location:  [place] office Persons on the call:  Me and the patient   History of Present Illness: patient says asthma is acting up and he is having to use albuterol every 4 hours.  He is compliant with advair bid.  He hasn't taken prednisone in a while and feels like he needs it.  No fever.  No cough.  +nasal congestion that is ongoing.     Blood sugars running 98-104.  Says BP is controlled at home.  Had labs in may and June.    Observations/Objective:  A&Ox3   Assessment and Plan: 1. Essential hypertension Controlled-continue current regimen - amLODipine (NORVASC) 10 MG tablet; Take 1 tablet (10 mg total) by mouth daily.  Dispense: 90 tablet; Refill: 3 - lisinopril-hydrochlorothiazide (ZESTORETIC) 10-12.5 MG tablet; Take 1 tablet by mouth daily.  Dispense: 90 tablet; Refill: 3  2. Moderate persistent asthma with acute exacerbation - predniSONE (DELTASONE) 10 MG tablet; Take 4 tabs po daily x 2 days; then 3 tabs for 2 days; then 2 tabs for 2 days; then 1 tab for 2 days  Dispense: 20 tablet; Refill: 0 - ipratropium-albuterol (DUONEB) 0.5-2.5 (3) MG/3ML SOLN; Take 3 mLs by nebulization every 4 (four) hours as needed.  Dispense: 360 mL; Refill: 2  3. Moderate persistent asthma, unspecified whether complicated Eliminate irritants.  Watch blood sugar closely while  on prednisone - predniSONE (DELTASONE) 10 MG tablet; Take 4 tabs po daily x 2 days; then 3 tabs for 2 days; then 2 tabs for 2 days; then 1 tab for 2 days  Dispense: 20 tablet; Refill: 0 - montelukast (SINGULAIR) 10 MG tablet; Take 1 tablet (10 mg total) by mouth at bedtime.  Dispense: 90 tablet; Refill: 3  4. Hyperlipidemia, unspecified hyperlipidemia type - pravastatin (PRAVACHOL) 20 MG tablet; Take 1 tablet (20 mg total) by mouth at bedtime.  Dispense: 90 tablet; Refill: 0  5. Sinus congestion Eliminate irritants - cetirizine (ZYRTEC) 10 MG tablet; Take 1 tablet (10 mg total) by mouth daily.  Dispense: 30 tablet; Refill: 11    Follow Up Instructions: See PCP in 2 months   I discussed the assessment and treatment plan with the patient. The patient was provided an opportunity to ask questions and all were answered. The patient agreed with the plan and demonstrated an understanding of the instructions.   The patient was advised to call back or seek an in-person evaluation if the symptoms worsen or if the condition fails to improve as anticipated.  I provided 12 minutes of non-face-to-face time during this encounter.   Freeman Caldron, PA-C

## 2018-11-17 NOTE — Telephone Encounter (Signed)
Please get him established with sleep Doctor. Thanks

## 2018-11-17 NOTE — Telephone Encounter (Signed)
Returned call to patient. Scheduled sleep consult with AO. Nothing further needed.

## 2018-11-19 ENCOUNTER — Ambulatory Visit: Payer: Medicare Other | Admitting: Pulmonary Disease

## 2018-11-22 ENCOUNTER — Ambulatory Visit: Payer: Medicare Other | Admitting: Internal Medicine

## 2018-11-22 ENCOUNTER — Telehealth: Payer: Self-pay | Admitting: Internal Medicine

## 2018-11-22 NOTE — Telephone Encounter (Signed)
David from [facility] he wants to make sure that the patient rx Metformin regular is ok because he on Metformin ER. Please, call Shanon Brow at  [phone] .  Thank you

## 2018-11-24 ENCOUNTER — Telehealth: Payer: Self-pay | Admitting: Pulmonary Disease

## 2018-11-24 ENCOUNTER — Telehealth: Payer: Self-pay | Admitting: Internal Medicine

## 2018-11-24 NOTE — Telephone Encounter (Signed)
ATC pt, no answer. Left message for pt to call back.   He has an appt tomorrow so maybe someone called to ask Covid questions

## 2018-11-24 NOTE — Telephone Encounter (Signed)
Pharmacy called to see if metFORMIN (GLUCOPHAGE) 500 MG tablet [[id number]] was changed from immediate release to extendent release..please follow up

## 2018-11-25 ENCOUNTER — Other Ambulatory Visit: Payer: Self-pay

## 2018-11-25 ENCOUNTER — Encounter: Payer: Self-pay | Admitting: Pulmonary Disease

## 2018-11-25 ENCOUNTER — Ambulatory Visit (INDEPENDENT_AMBULATORY_CARE_PROVIDER_SITE_OTHER): Payer: Medicare Other | Admitting: Pulmonary Disease

## 2018-11-25 VITALS — BP 136/80 | HR 85 | Temp 97.9°F | Ht 70.0 in | Wt 182.2 lb

## 2018-11-25 DIAGNOSIS — J45909 Unspecified asthma, uncomplicated: Secondary | ICD-10-CM | POA: Diagnosis not present

## 2018-11-25 DIAGNOSIS — G4733 Obstructive sleep apnea (adult) (pediatric): Secondary | ICD-10-CM

## 2018-11-25 NOTE — Telephone Encounter (Signed)
Noted. Will close this encounter.  

## 2018-11-25 NOTE — Progress Notes (Signed)
Christian [name]    [id number]    1961/01/01  Primary Care Physician:Johnson, Dalbert Batman, MD  Referring Physician: Ladell Pier, MD [address] [place],  [place] [address]  Chief complaint:   History of obstructive sleep apnea  HPI:  Diagnosed with OSA over 10 years ago Stopped using machine during episodes of recurrent upper respiratory infections  Has not used the machine for couple years now  Does have some history of shortness of breath, history of lung disease Exposure to secondhand smoke  Usually goes to bed between 10 and 11:30 PM Takes him between 15 and 30 minutes to fall asleep Final awakening between 6 and 7 AM  Never smoker  History of hypertension, asthma, high cholesterol   Outpatient Encounter Medications as of 11/25/2018  Medication Sig  . albuterol (PROAIR HFA) 108 (90 Base) MCG/ACT inhaler Inhale 1-2 puffs into the lungs every 6 (six) hours as needed for wheezing or shortness of breath.  Marland Kitchen amLODipine (NORVASC) 10 MG tablet Take 1 tablet (10 mg total) by mouth daily.  Marland Kitchen aspirin EC 325 MG tablet Take 1 tablet (325 mg total) by mouth 2 (two) times daily.  . Blood Glucose Monitoring Suppl (ONE TOUCH ULTRA 2) w/Device KIT Use as directed 3 times daily E11.9  . cetirizine (ZYRTEC) 10 MG tablet Take 1 tablet (10 mg total) by mouth daily.  . diclofenac sodium (VOLTAREN) 1 % GEL Apply 2 g 4 (four) times daily topically. (Patient taking differently: Apply 2 g topically 4 (four) times daily as needed (for pain.). )  . fluticasone (FLONASE) 50 MCG/ACT nasal spray Place 2 sprays into both nostrils daily.  . Fluticasone-Salmeterol (ADVAIR DISKUS) 250-50 MCG/DOSE AEPB Inhale 1 puff into the lungs 2 (two) times daily.  Marland Kitchen gabapentin (NEURONTIN) 300 MG capsule TAKE 1 CAPSULE BY MOUTH THREE TIMES DAILY  . glucose blood (PRODIGY NO CODING BLOOD GLUC) test strip Use as instructed  . glucose blood (TRUE METRIX BLOOD GLUCOSE TEST) test strip Check Blood sugar  1-2 x a day.  Has Prodigy Auto code meter  . glucose blood test strip Use once daily to check blood sugars.  Prodigit test stripes  . ipratropium-albuterol (DUONEB) 0.5-2.5 (3) MG/3ML SOLN Take 3 mLs by nebulization every 4 (four) hours as needed.  Marland Kitchen lisinopril-hydrochlorothiazide (ZESTORETIC) 10-12.5 MG tablet Take 1 tablet by mouth daily.  . metFORMIN (GLUCOPHAGE) 500 MG tablet Take 1 tablet (500 mg total) by mouth daily with breakfast.  . methocarbamol (ROBAXIN) 750 MG tablet Take 1 tablet (750 mg total) by mouth 2 (two) times daily as needed for muscle spasms.  . montelukast (SINGULAIR) 10 MG tablet Take 1 tablet (10 mg total) by mouth at bedtime.  . ondansetron (ZOFRAN) 4 MG tablet Take 1-2 tablets (4-8 mg total) by mouth every 8 (eight) hours as needed for nausea or vomiting.  . pravastatin (PRAVACHOL) 20 MG tablet Take 1 tablet (20 mg total) by mouth at bedtime.  . senna-docusate (SENOKOT S) 8.6-50 MG tablet Take 1 tablet by mouth at bedtime as needed.  . TRUEPLUS LANCETS 28G MISC Check glucose 2 times a day  . Vitamin D, Ergocalciferol, (DRISDOL) 1.25 MG (50000 UT) CAPS capsule Take 1 capsule (50,000 Units total) by mouth every 7 (seven) days.  . [DISCONTINUED] Blood Glucose Monitoring Suppl Supplies MISC Prodigy Autocode test stripes - check blood sugar once daily  . [DISCONTINUED] predniSONE (DELTASONE) 10 MG tablet Take 4 tabs po daily x 2 days;  then 3 tabs for 2 days; then 2 tabs for 2 days; then 1 tab for 2 days   No facility-administered encounter medications on file as of 11/25/2018.     Allergies as of 11/25/2018 - Review Complete 11/25/2018  Allergen Reaction Noted  . Phenergan [promethazine hcl] Itching and Other (See Comments) 09/15/2015    Past Medical History:  Diagnosis Date  . Allergy   . Arthritis    "left knee" (01/22/2016)  . Asthma   . Chronic lower back pain   . COPD (chronic obstructive pulmonary disease) ([place])   . Dyspnea    with extertion  . High  cholesterol   . Hypertension   . Neuropathy   . OSA on CPAP   . Secondhand smoke exposure   . Sleep apnea    wears CPAP, not wearing now  for months  . Type II diabetes mellitus ([place]) dx'd 01/17/2016    Past Surgical History:  Procedure Laterality Date  . ANTERIOR CERVICAL DECOMP/DISCECTOMY FUSION  ~ 1995   "titanium"  . APPENDECTOMY  ~ 1977  . BACK SURGERY     x 2  L 4disectomy  . COLONOSCOPY W/ POLYPECTOMY    . INGUINAL HERNIA REPAIR Right ~ 2000  . LUMBAR DISC SURGERY  ~ 2000; ~2006  . NASAL SINUS SURGERY  ~ 2007  . TOTAL KNEE ARTHROPLASTY Left 08/06/2017   Procedure: LEFT TOTAL KNEE ARTHROPLASTY;  Surgeon: Leandrew Koyanagi, MD;  Location: [facility];  Service: Orthopedics;  Laterality: Left;    Family History  Problem Relation Age of Onset  . Hypertension Mother   . Hypertension Father   . Heart Problems Brother        Heart transplant, pt does not know cause   . Rectal cancer Brother   . Colon cancer Neg Hx   . Esophageal cancer Neg Hx   . Stomach cancer Neg Hx     Social History   Socioeconomic History  . Marital status: Married    Spouse name: Not on file  . Number of children: Not on file  . Years of education: Not on file  . Highest education level: Not on file  Occupational History  . Not on file  Social Needs  . Financial resource strain: Not on file  . Food insecurity    Worry: Not on file    Inability: Not on file  . Transportation needs    Medical: Not on file    Non-medical: Not on file  Tobacco Use  . Smoking status: Passive Smoke Exposure - Never Smoker  . Smokeless tobacco: Never Used  . Tobacco comment: passive cig smoking at work  1994- 2012  Substance and Sexual Activity  . Alcohol use: No  . Drug use: No  . Sexual activity: Yes  Lifestyle  . Physical activity    Days per week: Not on file    Minutes per session: Not on file  . Stress: Not on file  Relationships  . Social Herbalist on phone: Not on file    Gets together: Not  on file    Attends religious service: Not on file    Active member of club or organization: Not on file    Attends meetings of clubs or organizations: Not on file    Relationship status: Not on file  . Intimate partner violence    Fear of current or ex partner: Not on file    Emotionally abused: Not on file  Physically abused: Not on file    Forced sexual activity: Not on file  Other Topics Concern  . Not on file  Social History Narrative  . Not on file    Review of Systems  Constitutional: Negative.   HENT: Negative.   Eyes: Negative.   Respiratory: Positive for shortness of breath.   Cardiovascular: Negative.   Gastrointestinal: Negative.   Endocrine: Negative.   Genitourinary: Negative.     Vitals:   11/25/18 1608  BP: 136/80  Pulse: 85  Temp: 97.9 F (36.6 C)  SpO2: 96%     Physical Exam  Constitutional: He is oriented to person, place, and time. He appears well-developed and well-nourished.  HENT:  Head: Normocephalic and atraumatic.  Eyes: Pupils are equal, round, and reactive to light. Conjunctivae are normal. Right eye exhibits no discharge.  Neck: Normal range of motion. Neck supple. No tracheal deviation present. No thyromegaly present.  Cardiovascular: Normal rate and regular rhythm.  Pulmonary/Chest: Effort normal and breath sounds normal. No respiratory distress. He has no wheezes. He has no rales.  Abdominal: Soft. Bowel sounds are normal. He exhibits no distension. There is no abdominal tenderness.  Musculoskeletal: Normal range of motion.        General: No deformity or edema.  Neurological: He is alert and oriented to person, place, and time. No cranial nerve deficit.  Skin: Skin is warm and dry. No erythema.  Psychiatric: He has a normal mood and affect.   Results of the Epworth flowsheet 11/25/2018  Sitting and reading 0  Watching TV 1  Sitting, inactive in a public place (e.g. a theatre or a meeting) 0  As a passenger in a car for an hour  without a break 0  Lying down to rest in the afternoon when circumstances permit 3  Sitting and talking to someone 3  Sitting quietly after a lunch without alcohol 0  In a car, while stopped for a few minutes in traffic 0  Total score 7    Data Reviewed: Previous sleep study not available to be reviewed  Assessment:  Known obstructive sleep apnea  Daytime sleepiness  Shortness of breath  Plan/Recommendations: We will obtain a home sleep study  .  Obtain pulmonary function study  .encouraged to call with any significant questions   Sherrilyn Rist MD [facility] 11/25/2018, 4:51 PM  CC: Ladell Pier, MD

## 2018-11-25 NOTE — Patient Instructions (Signed)
Obstructive sleep apnea  Obtain a home sleep study Pulmonary function study  We will see you back in the office in about 3 months Call with significant concerns

## 2018-11-25 NOTE — Telephone Encounter (Signed)
Pt returning call and said to call back @ [facility] already done not sure why he got call.Christian [name]

## 2018-11-25 NOTE — Telephone Encounter (Signed)
Left message for patient to call back  

## 2018-12-15 ENCOUNTER — Other Ambulatory Visit: Payer: Self-pay

## 2018-12-15 ENCOUNTER — Ambulatory Visit: Payer: Medicare Other

## 2018-12-15 DIAGNOSIS — G4733 Obstructive sleep apnea (adult) (pediatric): Secondary | ICD-10-CM | POA: Diagnosis not present

## 2018-12-19 IMAGING — DX DG KNEE 1-2V PORT*L*
2 series · 2 of 2 positions shown · non-contrast
Comparison: Preop study from 03/03/2017

CLINICAL DATA: Postop left knee arthroplasty

EXAM:
PORTABLE LEFT KNEE - 1-2 VIEW

[knee ap]
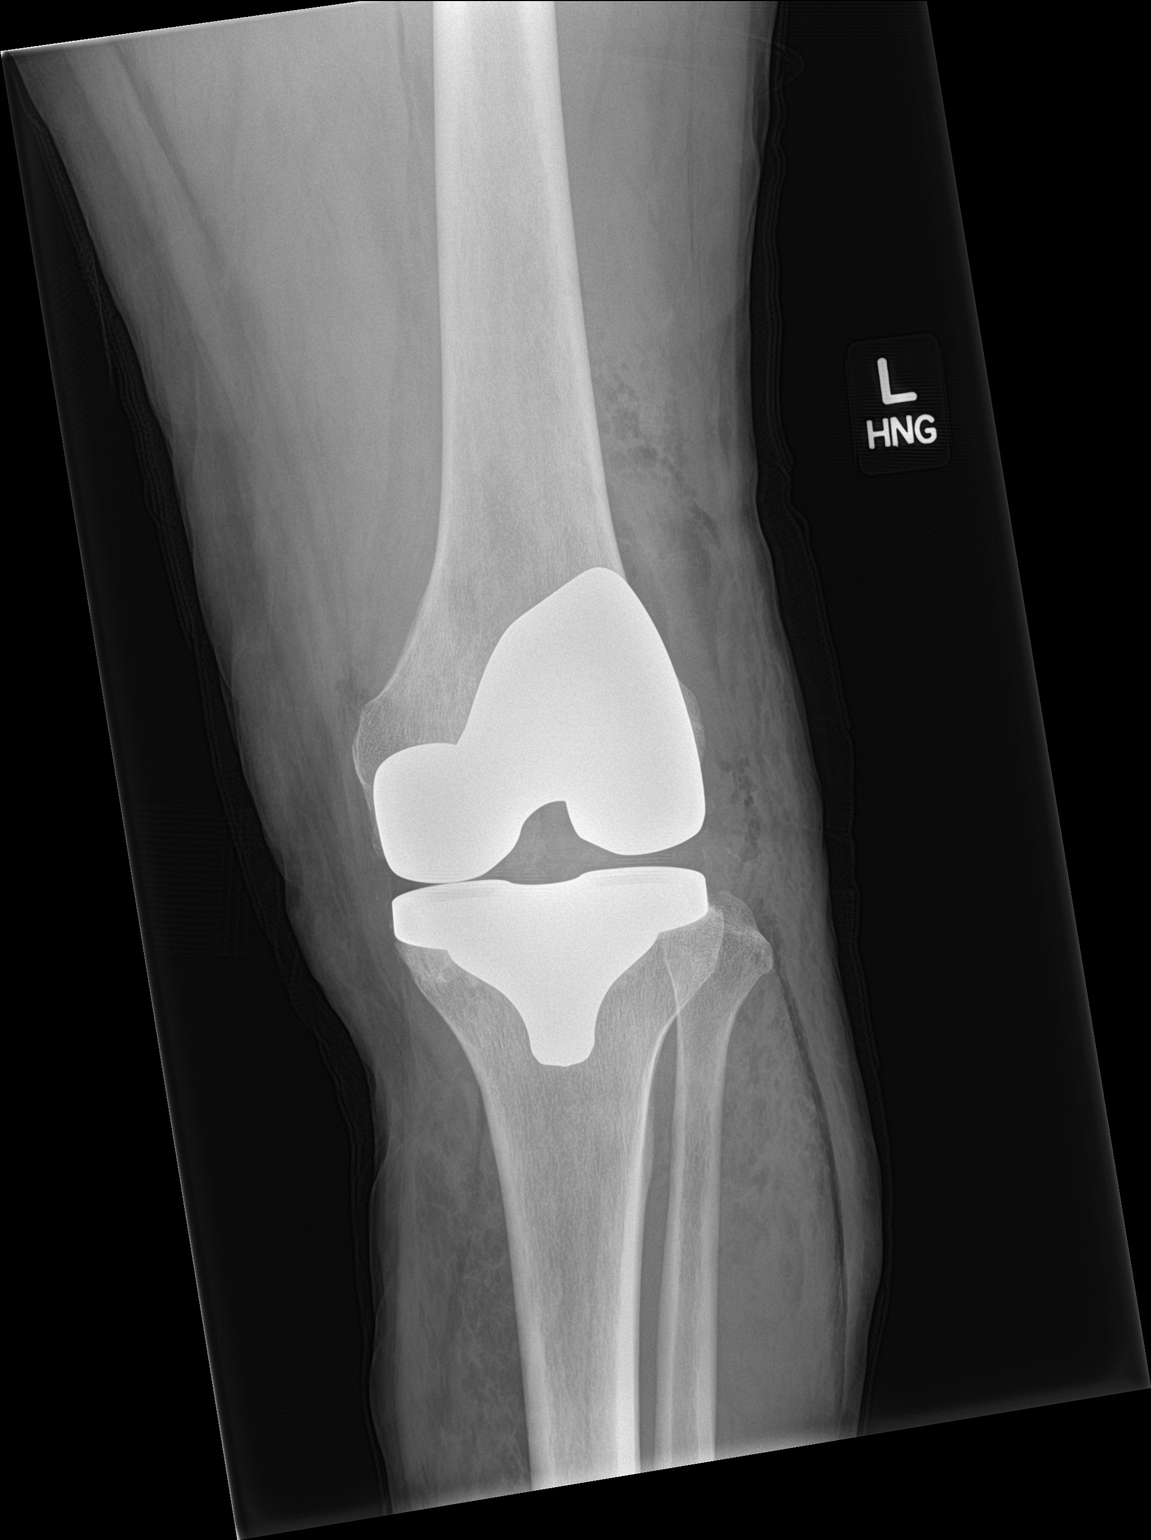

[knee lat]
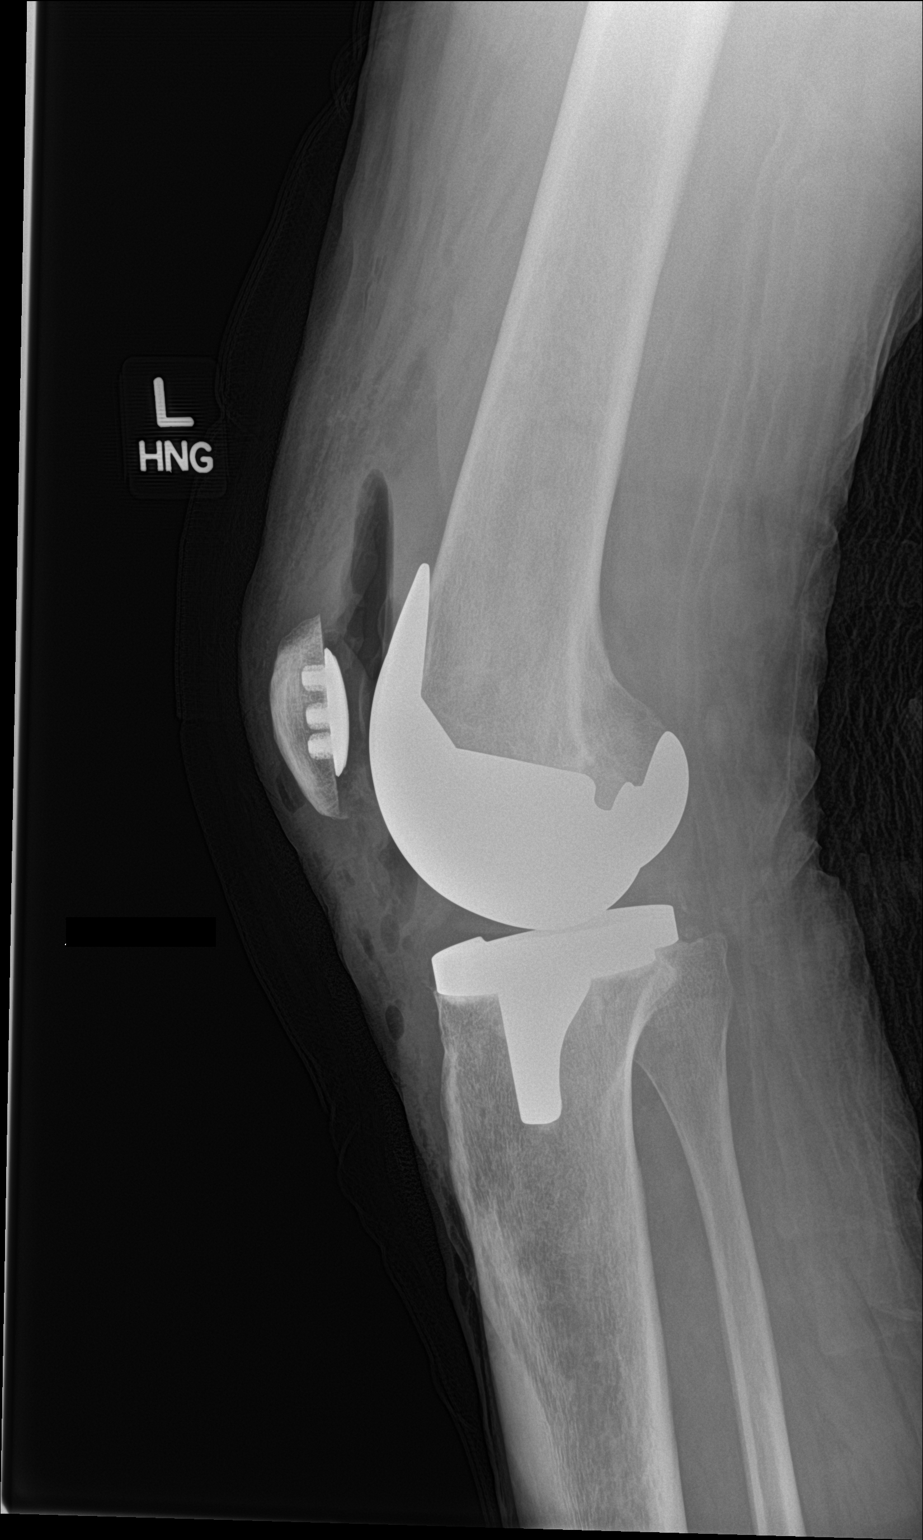

[2 of 2 positions shown; findings below may reference images not displayed]

FINDINGS: Subcutaneous and intra-articular emphysema status post left total
knee arthroplasty with patellar button in place. No hardware failure
or loosening. No immediate postoperative complications.
IMPRESSION: Intact new left total knee arthroplasty with postop soft tissue
change.

## 2018-12-20 ENCOUNTER — Ambulatory Visit: Payer: Medicare Other | Admitting: Internal Medicine

## 2018-12-28 DIAGNOSIS — G4733 Obstructive sleep apnea (adult) (pediatric): Secondary | ICD-10-CM

## 2018-12-29 ENCOUNTER — Telehealth: Payer: Self-pay

## 2018-12-29 DIAGNOSIS — G4733 Obstructive sleep apnea (adult) (pediatric): Secondary | ICD-10-CM

## 2018-12-29 NOTE — Telephone Encounter (Signed)
The patient returned my call and I made him aware that his sleep study showed mild OSA and its recommended that he start CPAP therapy. He verbalized understanding and an order was placed for CPAP. Nothing further is needed at this time.

## 2018-12-29 NOTE — Telephone Encounter (Signed)
LMTCB x1 in regards to sleep study results.  Results show mild OSA and recommend auto CPAP therapy at 5-15.

## 2019-01-18 NOTE — Progress Notes (Signed)
Virtual Visit via Telephone Note  I connected with [employer] [place] on 01/18/19 at  2:10 PM EDT by telephone and verified that I am speaking with the correct person using two identifiers.   I discussed the limitations, risks, security and privacy concerns of performing an evaluation and management service by telephone and the availability of in person appointments. I also discussed with the patient that there may be a patient responsible charge related to this service. The patient expressed understanding and agreed to proceed.  Patient location:  home My Location:  [place] office Persons on the call:  Me and the patient   History of Present Illness: Sinus congestion and pressure with yellow discharge when he blows his nose.  Asthma is flaring.  Some cough.  Blood sugars running 100-110.  No fever.  No GI S/Sx.  Symptoms present for 2 weeks and not improving   Observations/Objective:   NAD.  A&Ox3   Assessment and Plan: 1. Sinus congestion See #4  2. Moderate persistent asthma, unspecified whether complicated - predniSONE (DELTASONE) 10 MG tablet; 6,5,4,3,2,1 take each days dose in am with food  Dispense: 21 tablet; Refill: 0 - Fluticasone-Salmeterol (ADVAIR DISKUS) 250-50 MCG/DOSE AEPB; Inhale 1 puff into the lungs 2 (two) times daily.  Dispense: 1 each; Refill: 6 - glucose blood test strip; Use once daily to check blood sugars.  Prodigit test stripes  Dispense: 100 each; Refill: 12  3. Sinusitis, unspecified chronicity, unspecified location - predniSONE (DELTASONE) 10 MG tablet; 6,5,4,3,2,1 take each days dose in am with food  Dispense: 21 tablet; Refill: 0 - amoxicillin (AMOXIL) 500 MG capsule; Take 1 capsule (500 mg total) by mouth 3 (three) times daily.  Dispense: 30 capsule; Refill: 0 - fluticasone (FLONASE) 50 MCG/ACT nasal spray; Place 2 sprays into both nostrils daily.  Dispense: 16 g; Refill: 6  4.  Diabetes-controlled.  Continue current regimen.  Watch blood sugars closely  while on prednisone.     Follow Up Instructions: See PCP in 2 months.   I discussed the assessment and treatment plan with the patient. The patient was provided an opportunity to ask questions and all were answered. The patient agreed with the plan and demonstrated an understanding of the instructions.   The patient was advised to call back or seek an in-person evaluation if the symptoms worsen or if the condition fails to improve as anticipated.  I provided 12 minutes of non-face-to-face time during this encounter.   Freeman Caldron, PA-C  Patient ID: Christian [name] Junior Diamantina Monks, male   DOB: [birth year], 58 y.o.   MRN: [id number]

## 2019-01-19 ENCOUNTER — Ambulatory Visit: Payer: Medicare Other | Attending: Family Medicine | Admitting: Physician Assistant

## 2019-01-19 ENCOUNTER — Other Ambulatory Visit: Payer: Self-pay

## 2019-01-19 DIAGNOSIS — R0981 Nasal congestion: Secondary | ICD-10-CM | POA: Diagnosis not present

## 2019-01-19 DIAGNOSIS — E1140 Type 2 diabetes mellitus with diabetic neuropathy, unspecified: Secondary | ICD-10-CM | POA: Diagnosis not present

## 2019-01-19 DIAGNOSIS — J329 Chronic sinusitis, unspecified: Secondary | ICD-10-CM

## 2019-01-19 DIAGNOSIS — J4540 Moderate persistent asthma, uncomplicated: Secondary | ICD-10-CM

## 2019-01-19 MED ORDER — FLUTICASONE PROPIONATE 50 MCG/ACT NA SUSP: 2.0000 | Freq: Every day | NASAL | 6 refills | Status: DC

## 2019-01-19 MED ORDER — FLUTICASONE-SALMETEROL 250-50 MCG/DOSE IN AEPB: 1.0000 | INHALATION_SPRAY | Freq: Two times a day (BID) | RESPIRATORY_TRACT | 6 refills | Status: DC

## 2019-01-19 MED ORDER — AMOXICILLIN 500 MG PO CAPS: 500.0000 mg | ORAL_CAPSULE | Freq: Three times a day (TID) | ORAL | 0 refills | Status: DC

## 2019-01-19 MED ORDER — GLUCOSE BLOOD VI STRP: ORAL_STRIP | 12 refills | Status: DC

## 2019-01-19 MED ORDER — PREDNISONE 10 MG PO TABS: ORAL_TABLET | ORAL | 0 refills | Status: DC

## 2019-01-20 ENCOUNTER — Other Ambulatory Visit: Payer: Self-pay | Admitting: Pharmacist

## 2019-01-20 DIAGNOSIS — E1140 Type 2 diabetes mellitus with diabetic neuropathy, unspecified: Secondary | ICD-10-CM

## 2019-01-20 MED ORDER — ONETOUCH VERIO VI STRP: ORAL_STRIP | 12 refills | Status: DC

## 2019-02-01 ENCOUNTER — Other Ambulatory Visit: Payer: Self-pay | Admitting: Internal Medicine

## 2019-02-01 ENCOUNTER — Telehealth: Payer: Self-pay | Admitting: Pulmonary Disease

## 2019-02-03 NOTE — Telephone Encounter (Signed)
L/m on pt vm to call back to schedule pft -pr  °

## 2019-02-04 NOTE — Telephone Encounter (Signed)
pft scheduled on 04/05/2019-pr

## 2019-03-02 ENCOUNTER — Other Ambulatory Visit: Payer: Self-pay

## 2019-03-02 ENCOUNTER — Ambulatory Visit: Payer: Medicare Other | Attending: Family Medicine | Admitting: Family Medicine

## 2019-03-02 DIAGNOSIS — J329 Chronic sinusitis, unspecified: Secondary | ICD-10-CM | POA: Diagnosis not present

## 2019-03-02 DIAGNOSIS — J4540 Moderate persistent asthma, uncomplicated: Secondary | ICD-10-CM | POA: Diagnosis not present

## 2019-03-02 MED ORDER — PREDNISONE 10 MG PO TABS: ORAL_TABLET | ORAL | 0 refills | Status: DC

## 2019-03-02 MED ORDER — MONTELUKAST SODIUM 10 MG PO TABS: 10.0000 mg | ORAL_TABLET | Freq: Every day | ORAL | 1 refills | Status: DC

## 2019-03-02 NOTE — Progress Notes (Signed)
Patient has been called and DOB has been verified. Patient has been screened and transferred to PCP to start phone visit.  Patient states that his asthma is acting up, patient is having a runny nose, and chest congestion.

## 2019-03-02 NOTE — Progress Notes (Signed)
Virtual Visit via Telephone Note  I connected with Christian [name], on 03/02/2019 at 9:50 AM by telephone due to the COVID-19 pandemic and verified that I am speaking with the correct person using two identifiers.   Consent: I discussed the limitations, risks, security and privacy concerns of performing an evaluation and management service by telephone and the availability of in person appointments. I also discussed with the patient that there may be a patient responsible charge related to this service. The patient expressed understanding and agreed to proceed.   Location of Patient: Home  Location of Provider: Clinic   Persons participating in Telemedicine visit: Arturo Junior Eilene Ghazi Farrington-CMA Dr. Felecia Shelling     History of Present Illness: Christian [name] is a 58 year old male with a history of COPD, type 2 diabetes mellitus, hyperlipidemia, hypertension, asthma who presents today for an acute visit.  His Asthma has been acting up along with his sinuses draining Seen on 01/19/19 by the PA via virtual visit and was treated with amoxicillin, flonase, Prednisone and he reports feeling better but then symptoms returned a couple days ago.  Compliant with Advair and Proventil. Denies facial pain, fever, myalgias, presence of sick contacts but endorses the presence of postnasal drip.  Denies presence of abnormal taste in his mouth, abnormal smell and states he was tested for Covid 19 this was negative. Singulair placed on his med list however he has not been taking it.  Past Medical History:  Diagnosis Date  . Allergy   . Arthritis    "left knee" (01/22/2016)  . Asthma   . Chronic lower back pain   . COPD (chronic obstructive pulmonary disease) ([place])   . Dyspnea    with extertion  . High cholesterol   . Hypertension   . Neuropathy   . OSA on CPAP   . Secondhand smoke exposure   . Sleep apnea    wears CPAP, not wearing now  for months  . Type II diabetes mellitus ([facility]) dx'd  01/17/2016   Allergies  Allergen Reactions  . Phenergan [Promethazine Hcl] Itching and Other (See Comments)    burning    Current Outpatient Medications on File Prior to Visit  Medication Sig Dispense Refill  . albuterol (PROAIR HFA) 108 (90 Base) MCG/ACT inhaler Inhale 1-2 puffs into the lungs every 6 (six) hours as needed for wheezing or shortness of breath. 1 Inhaler 6  . amLODipine (NORVASC) 10 MG tablet Take 1 tablet (10 mg total) by mouth daily. 90 tablet 3  . aspirin EC 325 MG tablet Take 1 tablet (325 mg total) by mouth 2 (two) times daily. (Patient taking differently: Take 325 mg by mouth once. ) 84 tablet 0  . Blood Glucose Monitoring Suppl (ONE TOUCH ULTRA 2) w/Device KIT Use as directed 3 times daily E11.9 1 each 0  . cetirizine (ZYRTEC) 10 MG tablet Take 1 tablet (10 mg total) by mouth daily. 30 tablet 11  . diclofenac sodium (VOLTAREN) 1 % GEL Apply 2 g 4 (four) times daily topically. (Patient taking differently: Apply 2 g topically 4 (four) times daily as needed (for pain.). ) 1 Tube 5  . fluticasone (FLONASE) 50 MCG/ACT nasal spray Place 2 sprays into both nostrils daily. 16 g 6  . Fluticasone-Salmeterol (ADVAIR DISKUS) 250-50 MCG/DOSE AEPB Inhale 1 puff into the lungs 2 (two) times daily. 1 each 6  . gabapentin (NEURONTIN) 300 MG capsule TAKE 1 CAPSULE BY MOUTH THREE TIMES DAILY 90 capsule 3  . glucose blood (  ONETOUCH VERIO) test strip Use as instructed to check blood sugar daily. 100 each 12  . ipratropium-albuterol (DUONEB) 0.5-2.5 (3) MG/3ML SOLN Take 3 mLs by nebulization every 4 (four) hours as needed. 360 mL 2  . lisinopril-hydrochlorothiazide (ZESTORETIC) 10-12.5 MG tablet Take 1 tablet by mouth daily. 90 tablet 3  . montelukast (SINGULAIR) 10 MG tablet Take 1 tablet (10 mg total) by mouth at bedtime. 90 tablet 3  . pravastatin (PRAVACHOL) 20 MG tablet Take 1 tablet (20 mg total) by mouth at bedtime. 90 tablet 0  . TRUEPLUS LANCETS 28G MISC Check glucose 2 times a day  100 each 3  . Vitamin D, Ergocalciferol, (DRISDOL) 1.25 MG (50000 UT) CAPS capsule Take 1 capsule (50,000 Units total) by mouth every 7 (seven) days. 16 capsule 0  . amoxicillin (AMOXIL) 500 MG capsule Take 1 capsule (500 mg total) by mouth 3 (three) times daily. (Patient not taking: Reported on 03/02/2019) 30 capsule 0  . methocarbamol (ROBAXIN) 750 MG tablet Take 1 tablet (750 mg total) by mouth 2 (two) times daily as needed for muscle spasms. (Patient not taking: Reported on 01/19/2019) 60 tablet 0  . ondansetron (ZOFRAN) 4 MG tablet Take 1-2 tablets (4-8 mg total) by mouth every 8 (eight) hours as needed for nausea or vomiting. (Patient not taking: Reported on 01/19/2019) 40 tablet 0  . predniSONE (DELTASONE) 10 MG tablet 6,5,4,3,2,1 take each days dose in am with food (Patient not taking: Reported on 03/02/2019) 21 tablet 0  . senna-docusate (SENOKOT S) 8.6-50 MG tablet Take 1 tablet by mouth at bedtime as needed. (Patient not taking: Reported on 01/19/2019) 30 tablet 1   No current facility-administered medications on file prior to visit.     Observations/Objective: Awake, alert, oriented x3 Not in acute distress  Assessment and Plan: 1. Moderate persistent asthma, unspecified whether complicated Uncontrolled with frequent flares Short course of prednisone He has not been taking Singulair and has been advised to do so If he has another flare he may benefit from increasing Advair dose - predniSONE (DELTASONE) 10 MG tablet; 6,5,4,3,2,1 take each days dose in am with food  Dispense: 21 tablet; Refill: 0 - montelukast (SINGULAIR) 10 MG tablet; Take 1 tablet (10 mg total) by mouth at bedtime.  Dispense: 90 tablet; Refill: 1  2. Sinusitis, unspecified chronicity, unspecified location Was treated with antibiotic on 9/23/2 N/A We will not prescribe antibiotics again at this time Educated on saline irrigation, continue Flonase - predniSONE (DELTASONE) 10 MG tablet; 6,5,4,3,2,1 take each days dose  in am with food  Dispense: 21 tablet; Refill: 0   Follow Up Instructions: Return for Follow-up of chronic medical conditions, keep previously scheduled appointment with PCP.    I discussed the assessment and treatment plan with the patient. The patient was provided an opportunity to ask questions and all were answered. The patient agreed with the plan and demonstrated an understanding of the instructions.   The patient was advised to call back or seek an in-person evaluation if the symptoms worsen or if the condition fails to improve as anticipated.     I provided 11 minutes total of non-face-to-face time during this encounter including median intraservice time, reviewing previous notes, labs, imaging, medications, management and patient verbalized understanding.     Charlott Rakes, MD, FAAFP. [facility], [place]   03/02/2019, 9:50 AM

## 2019-03-03 ENCOUNTER — Encounter: Payer: Self-pay | Admitting: Family Medicine

## 2019-03-04 ENCOUNTER — Telehealth: Payer: Self-pay | Admitting: Pulmonary Disease

## 2019-03-04 DIAGNOSIS — G4733 Obstructive sleep apnea (adult) (pediatric): Secondary | ICD-10-CM

## 2019-03-04 NOTE — Telephone Encounter (Signed)
Spoke with patient. He stated that he believes his CPAP machine is blowing out too much air. Denied having any nose bleeds or discomfort but did state he has been dealing with a lot of gas recently. He believes this is coming from the cpap machine. Advised patient that I would let AO review his D/L next week but he stated he did not want to wait until then. Advised him I would send the message to the app of the day, he verbalized understanding.   Download has been printed and given to [employer]. Beth, please advise. Thank you!

## 2019-03-04 NOTE — Telephone Encounter (Signed)
Spoke with patient. He was very appreciative of the pressure change. Advised him that I would go ahead and send the order to [facility] today. Also advised him to call us back if he does not notice any relief, he verbalized understanding.   Nothing further needed at time of call.

## 2019-03-04 NOTE — Telephone Encounter (Signed)
Please place order to change pressure to 5-13 cm h20 AHI 2.8

## 2019-03-07 ENCOUNTER — Telehealth: Payer: Self-pay | Admitting: Pulmonary Disease

## 2019-03-07 DIAGNOSIS — J4540 Moderate persistent asthma, uncomplicated: Secondary | ICD-10-CM

## 2019-03-07 NOTE — Telephone Encounter (Signed)
LMTCB

## 2019-03-07 NOTE — Telephone Encounter (Signed)
Patient is returning the call. CB is [phone].

## 2019-03-07 NOTE — Telephone Encounter (Signed)
Pt requesting a refill on his neb kit supplies and filter to [facility].  This has been sent as requested.  Nothing further needed at this time- will close encounter.

## 2019-03-08 ENCOUNTER — Telehealth: Payer: Self-pay | Admitting: Pulmonary Disease

## 2019-03-08 NOTE — Telephone Encounter (Signed)
[facility] report provided. Per AO patient needs a office visit.   Call made to patient, confirmed DOB, made aware he will need to present for OV. Pt request to only see AO. Appt made.   Nothing further needed at this time.

## 2019-03-08 NOTE — Telephone Encounter (Signed)
Call returned to patient, confirmed DOB, he states his cpap pressures are too high. He states he feels like too much oxygen is being pumped into his stomach. He states he can see and feel his stomach rising with air. He states it is very uncomfortable and at times painful.   AO please advise if pressures can be further decreased. Thanks.

## 2019-03-08 NOTE — Telephone Encounter (Signed)
Okay to decrease pressure to 5-10  We need a download from the machine to be reviewed to make sure that the lower pressures is still continuing to adequately treat the obstructive sleep apnea

## 2019-03-09 ENCOUNTER — Encounter: Payer: Self-pay | Admitting: Pulmonary Disease

## 2019-03-09 ENCOUNTER — Other Ambulatory Visit: Payer: Self-pay

## 2019-03-09 ENCOUNTER — Ambulatory Visit (INDEPENDENT_AMBULATORY_CARE_PROVIDER_SITE_OTHER): Payer: Medicare Other | Admitting: Pulmonary Disease

## 2019-03-09 VITALS — BP 144/80 | HR 64 | Ht 70.0 in | Wt 189.6 lb

## 2019-03-09 DIAGNOSIS — G4733 Obstructive sleep apnea (adult) (pediatric): Secondary | ICD-10-CM

## 2019-03-09 MED ORDER — FLUTICASONE-SALMETEROL 500-50 MCG/DOSE IN AEPB: 1.0000 | INHALATION_SPRAY | Freq: Two times a day (BID) | RESPIRATORY_TRACT | 3 refills | Status: DC

## 2019-03-09 MED ORDER — AMOXICILLIN-POT CLAVULANATE 875-125 MG PO TABS: 1.0000 | ORAL_TABLET | Freq: Two times a day (BID) | ORAL | 0 refills | Status: DC

## 2019-03-09 NOTE — Patient Instructions (Signed)
Augmentin 875-1 tab twice daily for 7 days Increase Advair to 500  Change CPAP pressures to 5-15 If you are still having problems with CPAP pressures, then we will need to look for other options of treatment for your sleep apnea -An oral device may be an option  I will see you back in the office in about 6 weeks  Ensure you use your CPAP on a regular basis so that we can get more information from your download

## 2019-03-09 NOTE — Progress Notes (Signed)
Christian [name]    [id number]    August 14, 1960  Primary Care Physician:Johnson, Dalbert Batman, MD  Referring Physician: Ladell Pier, MD [address] [place],  [place] [address]  Chief complaint:   History of obstructive sleep apnea  HPI:  Diagnosed with OSA over 10 years ago Stopped using machine during episodes of recurrent upper respiratory infections  Recently restarted CPAP use following a home sleep study which showed mild obstructive sleep apnea He feels he is having an asthma exacerbation Feels he has bloating with the CPAP We at some point change his pressures but is still having significant symptoms  He feels he is having the same symptoms that he had previously  Does have some history of shortness of breath, history of lung disease Exposure to secondhand smoke  Usually goes to bed between 10 and 11:30 PM Takes him between 15 and 30 minutes to fall asleep Final awakening between 6 and 7 AM  Never smoker  History of hypertension, asthma, high cholesterol  Is currently on a course of prednisone, uses Advair 250  Outpatient Encounter Medications as of 03/09/2019  Medication Sig  . albuterol (PROAIR HFA) 108 (90 Base) MCG/ACT inhaler Inhale 1-2 puffs into the lungs every 6 (six) hours as needed for wheezing or shortness of breath.  Marland Kitchen amLODipine (NORVASC) 10 MG tablet Take 1 tablet (10 mg total) by mouth daily.  Marland Kitchen aspirin EC 325 MG tablet Take 1 tablet (325 mg total) by mouth 2 (two) times daily. (Patient taking differently: Take 325 mg by mouth once. )  . Blood Glucose Monitoring Suppl (ONE TOUCH ULTRA 2) w/Device KIT Use as directed 3 times daily E11.9  . cetirizine (ZYRTEC) 10 MG tablet Take 1 tablet (10 mg total) by mouth daily.  . diclofenac sodium (VOLTAREN) 1 % GEL Apply 2 g 4 (four) times daily topically. (Patient taking differently: Apply 2 g topically 4 (four) times daily as needed (for pain.). )  . fluticasone (FLONASE) 50 MCG/ACT nasal spray  Place 2 sprays into both nostrils daily.  . Fluticasone-Salmeterol (ADVAIR DISKUS) 250-50 MCG/DOSE AEPB Inhale 1 puff into the lungs 2 (two) times daily.  Marland Kitchen gabapentin (NEURONTIN) 300 MG capsule TAKE 1 CAPSULE BY MOUTH THREE TIMES DAILY  . glucose blood (ONETOUCH VERIO) test strip Use as instructed to check blood sugar daily.  Marland Kitchen ipratropium-albuterol (DUONEB) 0.5-2.5 (3) MG/3ML SOLN Take 3 mLs by nebulization every 4 (four) hours as needed.  Marland Kitchen lisinopril-hydrochlorothiazide (ZESTORETIC) 10-12.5 MG tablet Take 1 tablet by mouth daily.  . methocarbamol (ROBAXIN) 750 MG tablet Take 1 tablet (750 mg total) by mouth 2 (two) times daily as needed for muscle spasms.  . montelukast (SINGULAIR) 10 MG tablet Take 1 tablet (10 mg total) by mouth at bedtime.  . ondansetron (ZOFRAN) 4 MG tablet Take 1-2 tablets (4-8 mg total) by mouth every 8 (eight) hours as needed for nausea or vomiting.  . pravastatin (PRAVACHOL) 20 MG tablet Take 1 tablet (20 mg total) by mouth at bedtime.  . predniSONE (DELTASONE) 10 MG tablet 6,5,4,3,2,1 take each days dose in am with food  . senna-docusate (SENOKOT S) 8.6-50 MG tablet Take 1 tablet by mouth at bedtime as needed.  . TRUEPLUS LANCETS 28G MISC Check glucose 2 times a day  . Vitamin D, Ergocalciferol, (DRISDOL) 1.25 MG (50000 UT) CAPS capsule Take 1 capsule (50,000 Units total) by mouth every 7 (seven) days.  Marland Kitchen amoxicillin-clavulanate (AUGMENTIN) 875-125 MG tablet Take 1  tablet by mouth 2 (two) times daily.  . Fluticasone-Salmeterol (ADVAIR DISKUS) 500-50 MCG/DOSE AEPB Inhale 1 puff into the lungs 2 (two) times daily.   No facility-administered encounter medications on file as of 03/09/2019.     Allergies as of 03/09/2019 - Review Complete 03/09/2019  Allergen Reaction Noted  . Phenergan [promethazine hcl] Itching and Other (See Comments) 09/15/2015    Past Medical History:  Diagnosis Date  . Allergy   . Arthritis    "left knee" (01/22/2016)  . Asthma   . Chronic  lower back pain   . COPD (chronic obstructive pulmonary disease) ([place])   . Dyspnea    with extertion  . High cholesterol   . Hypertension   . Neuropathy   . OSA on CPAP   . Secondhand smoke exposure   . Sleep apnea    wears CPAP, not wearing now  for months  . Type II diabetes mellitus ([place]) dx'd 01/17/2016    Past Surgical History:  Procedure Laterality Date  . ANTERIOR CERVICAL DECOMP/DISCECTOMY FUSION  ~ 1995   "titanium"  . APPENDECTOMY  ~ 1977  . BACK SURGERY     x 2  L 4disectomy  . COLONOSCOPY W/ POLYPECTOMY    . INGUINAL HERNIA REPAIR Right ~ 2000  . LUMBAR DISC SURGERY  ~ 2000; ~2006  . NASAL SINUS SURGERY  ~ 2007  . TOTAL KNEE ARTHROPLASTY Left 08/06/2017   Procedure: LEFT TOTAL KNEE ARTHROPLASTY;  Surgeon: Leandrew Koyanagi, MD;  Location: [facility];  Service: Orthopedics;  Laterality: Left;    Family History  Problem Relation Age of Onset  . Hypertension Mother   . Hypertension Father   . Heart Problems Brother        Heart transplant, pt does not know cause   . Rectal cancer Brother   . Colon cancer Neg Hx   . Esophageal cancer Neg Hx   . Stomach cancer Neg Hx     Social History   Socioeconomic History  . Marital status: Married    Spouse name: Not on file  . Number of children: Not on file  . Years of education: Not on file  . Highest education level: Not on file  Occupational History  . Not on file  Social Needs  . Financial resource strain: Not on file  . Food insecurity    Worry: Not on file    Inability: Not on file  . Transportation needs    Medical: Not on file    Non-medical: Not on file  Tobacco Use  . Smoking status: Passive Smoke Exposure - Never Smoker  . Smokeless tobacco: Never Used  . Tobacco comment: passive cig smoking at work  1994- 2012  Substance and Sexual Activity  . Alcohol use: No  . Drug use: No  . Sexual activity: Yes  Lifestyle  . Physical activity    Days per week: Not on file    Minutes per session: Not on file  .  Stress: Not on file  Relationships  . Social Herbalist on phone: Not on file    Gets together: Not on file    Attends religious service: Not on file    Active member of club or organization: Not on file    Attends meetings of clubs or organizations: Not on file    Relationship status: Not on file  . Intimate partner violence    Fear of current or ex partner: Not on file  Emotionally abused: Not on file    Physically abused: Not on file    Forced sexual activity: Not on file  Other Topics Concern  . Not on file  Social History Narrative  . Not on file    Review of Systems  Constitutional: Negative.   HENT: Negative.   Eyes: Negative.   Respiratory: Positive for shortness of breath.   Cardiovascular: Negative.   Gastrointestinal: Negative.   Endocrine: Negative.   Genitourinary: Negative.     Vitals:   03/09/19 1025  BP: (!) 144/80  Pulse: 64  SpO2: 98%     Physical Exam  Constitutional: He appears well-developed and well-nourished.  HENT:  Head: Normocephalic and atraumatic.  Eyes: Pupils are equal, round, and reactive to light. Conjunctivae are normal. Right eye exhibits no discharge.  Neck: Normal range of motion. Neck supple. No tracheal deviation present. No thyromegaly present.  Cardiovascular: Normal rate and regular rhythm.  Pulmonary/Chest: Effort normal and breath sounds normal. No respiratory distress. He has no wheezes. He has no rales.  Abdominal: Soft. Bowel sounds are normal. He exhibits no distension. There is no abdominal tenderness.   Results of the [facility] flowsheet 11/25/2018  Sitting and reading 0  Watching TV 1  Sitting, inactive in a public place (e.g. a theatre or a meeting) 0  As a passenger in a car for an hour without a break 0  Lying down to rest in the afternoon when circumstances permit 3  Sitting and talking to someone 3  Sitting quietly after a lunch without alcohol 0  In a car, while stopped for a few minutes in  traffic 0  Total score 7    Data Reviewed: Previous sleep study not available to be reviewed Most recent study shows mild obstructive sleep apnea  Assessment:  Mild obstructive sleep apnea -Having difficulty with CPAP pressures -Compliance data reveals-inadequate data at present -Having issues with bloating -We will continue CPAP at present  Asthma with exacerbation -Nasal stuffiness and congestion -We will give him a course of Augmentin -Complete course of steroids  Daytime sleepiness  Shortness of breath  Plan/Recommendations: Augmentin 875 twice daily for 7 days Complete course of steroids Increase Advair to 501 puff twice daily  I will see him back in the office in about 6 weeks  He will continue CPAP-CPAP pressure 5-15  We will follow up with a download at next visit  Encouraged to call with any significant concerns  We will try and obtain some information from his previous pulmonologist   Sherrilyn Rist MD [facility] 03/09/2019, 10:49 AM  CC: Ladell Pier, MD

## 2019-03-11 ENCOUNTER — Other Ambulatory Visit: Payer: Self-pay | Admitting: Internal Medicine

## 2019-03-11 DIAGNOSIS — J4540 Moderate persistent asthma, uncomplicated: Secondary | ICD-10-CM

## 2019-04-05 ENCOUNTER — Other Ambulatory Visit: Payer: Self-pay

## 2019-04-05 ENCOUNTER — Telehealth: Payer: Self-pay | Admitting: Pulmonary Disease

## 2019-04-05 ENCOUNTER — Ambulatory Visit: Payer: Medicare Other | Admitting: Pulmonary Disease

## 2019-04-05 ENCOUNTER — Ambulatory Visit: Payer: Medicare Other

## 2019-04-05 ENCOUNTER — Encounter: Payer: Self-pay | Admitting: Pulmonary Disease

## 2019-04-05 VITALS — BP 126/82 | HR 72 | Temp 97.6°F | Ht 70.0 in | Wt 186.0 lb

## 2019-04-05 DIAGNOSIS — J309 Allergic rhinitis, unspecified: Secondary | ICD-10-CM

## 2019-04-05 DIAGNOSIS — Z8709 Personal history of other diseases of the respiratory system: Secondary | ICD-10-CM | POA: Insufficient documentation

## 2019-04-05 DIAGNOSIS — J8283 Eosinophilic asthma: Secondary | ICD-10-CM

## 2019-04-05 DIAGNOSIS — G4733 Obstructive sleep apnea (adult) (pediatric): Secondary | ICD-10-CM

## 2019-04-05 DIAGNOSIS — J45909 Unspecified asthma, uncomplicated: Secondary | ICD-10-CM

## 2019-04-05 NOTE — Assessment & Plan Note (Signed)
Plan: Continue CPAP therapy 

## 2019-04-05 NOTE — Telephone Encounter (Signed)
[facility] states will be faxing back the enrollment form for [facility].  Needs [facility] directions.  Fax number is [phone]. Phone number is [phone].

## 2019-04-05 NOTE — Progress Notes (Signed)
$'@Patient'Q$  ID: Christian [name], male    DOB: 17-Jun-1960, 58 y.o.   MRN: [id number]  Chief Complaint  Patient presents with  . Follow-up    F/U for asthma and OSA. States his breathing has been ok since last visit. Still able to use his cpap machine.     Referring provider: Ladell Pier, MD  HPI:  58 year old male never smoker, passive smoke exposure followed in our office for asthma as well as obstructive sleep apnea  PMH: Hypertension, hyperlipidemia, diabetes, chronic pain syndrome Smoker/ Smoking History: Never smoker, passive smoke exposure Maintenance: Advair 500 Pt of: Dr. Ander Slade  04/05/2019  - Visit   58 year old male never smoker (passive smoke exposure) followed in our office for asthma as well as mild obstructive sleep apnea.  Patient presenting today to complete a pulmonary function test, unfortunately the patient was never scheduled for Covid test so his pulmonary function test today had to be canceled.  Over the last year patient has had 3 exacerbations requiring steroids.  See those dates listed below:  04/06/2018-emergency room visit-asthma exacerbation treated with steroids 10/06/2018 treated with steroids by Pulm APP 01/19/2019 - treated with steroids by PCP PA  03/02/2019-treated with steroids by PCP  Patient does have blood work from June/2020 that shows elevated eosinophil count as well as elevated IgE.  Patient has a history of frequent exacerbations and this was documented by both primary care visits, emergency room visits as well as pulmonary visits.  Patient recently had Advair inhaler increased to 500 in November/2020.  Patient continues to be maintained on a CPAP.  CPAP compliance report shows adequate compliance.  See compliance report listed below:  03/04/2019-04/02/2019 20-23 in the last 30 days use, 19 days greater than 4 hours, average usage 6 hours and 20 minutes, APAP setting 5-15, 95th percentile 14, AHI 2.2  Patient reports he feels better when  he is on steroids.  He was previously managed on Advair 500 by his pulmonologist in [place], [place].  He does feel slightly better on Advair 500 which Dr. Ander Slade recently placed him back on in November/2020.  Patient also has a history of recurrent sinus infections.  He reports that he had a sinus surgery 4 years ago this was that an ENT in [place], [place].  Patient does not remember the name of the provider for [place] to be able to request records.   Tests:  04/06/2018-chest x-ray-negative, no cardiopulmonary abnormality  10/06/2018-CBC with differential-eosinophils relative 7.1, eosinophils absolute 0.4 10/06/2018-IgE-273  12/15/2018-home sleep study-AHI 12.1, SaO2 low 83%  05/07/2016-pulmonary function test-FVC 3.11 (82% predicted), postbronchodilator ratio 70, postbronchodilator FEV1 1.73 (58% predicted), no bronchodilator response, DLCO 22.17 (74% predicted)  01/23/2016-echocardiogram-LV ejection fraction 65 to 93%, grade 1 diastolic dysfunction  FENO:  No results found for: NITRICOXIDE  PFT: PFT Results Latest Ref Rng & Units 05/07/2016  FVC-Pre L 3.11  FVC-Predicted Pre % 82  FVC-Post L 2.46  FVC-Predicted Post % 64  Pre FEV1/FVC % % 58  Post FEV1/FCV % % 70  FEV1-Pre L 1.79  FEV1-Predicted Pre % 60  FEV1-Post L 1.73  DLCO UNC% % 74  DLCO COR %Predicted % 108  TLC L 5.42  TLC % Predicted % 82  RV % Predicted % 103    WALK:  No flowsheet data found.  Imaging: No results found.  Lab Results:  CBC    Component Value Date/Time   WBC 5.3 10/06/2018 1610   RBC 4.79 10/06/2018 1610   HGB 14.2  10/06/2018 1610   HGB 16.2 08/19/2018 1441   HCT 42.9 10/06/2018 1610   HCT 48.8 08/19/2018 1441   PLT 255.0 10/06/2018 1610   PLT 266 08/19/2018 1441   MCV 89.6 10/06/2018 1610   MCV 88 08/19/2018 1441   MCH 29.2 08/19/2018 1441   MCH 28.0 04/06/2018 0909   MCHC 33.2 10/06/2018 1610   RDW 13.7 10/06/2018 1610   RDW 13.3 08/19/2018 1441   LYMPHSABS 1.6  10/06/2018 1610   LYMPHSABS 2.1 08/19/2018 1441   MONOABS 0.6 10/06/2018 1610   EOSABS 0.4 10/06/2018 1610   EOSABS 0.5 (H) 08/19/2018 1441   BASOSABS 0.0 10/06/2018 1610   BASOSABS 0.1 08/19/2018 1441    BMET    Component Value Date/Time   NA 139 09/14/2018 1047   K 3.8 09/14/2018 1047   CL 104 09/14/2018 1047   CO2 23 09/14/2018 1047   GLUCOSE 122 (H) 09/14/2018 1047   GLUCOSE 129 (H) 04/06/2018 0909   BUN 14 09/14/2018 1047   CREATININE 1.28 (H) 09/14/2018 1047   CREATININE 1.09 01/14/2016 1523   CALCIUM 8.3 (L) 09/14/2018 1047   GFRNONAA 61 09/14/2018 1047   GFRNONAA 75 01/14/2016 1523   GFRAA 71 09/14/2018 1047   GFRAA 87 01/14/2016 1523    BNP No results found for: BNP  ProBNP No results found for: PROBNP  Specialty Problems      Pulmonary Problems   Intrinsic asthma    January 2018 PFT> pre bronchodilator ratio 58%, FEV1 1.79L (60% pred), (post bronchodilator FEV didn't change but ratio changed, mostly because of what appears to be change in inspiratory flow), FVC 3.11L (82% pred), TLC 5.42L (82% pred), DLCO 22.17 (74% pred)      Eosinophilic asthma    9/39/0300-PQZRAQTMA function test-FVC 3.11 (82% predicted), postbronchodilator ratio 70, postbronchodilator FEV1 1.73 (58% predicted), no bronchodilator response, DLCO 22.17 (74% predicted)  10/06/2018-CBC with differential-eosinophils relative 7.1, eosinophils absolute 0.4 10/06/2018-IgE-273       Allergic rhinitis    10/06/2018-CBC with differential-eosinophils relative 7.1, eosinophils absolute 0.4 10/06/2018-IgE-273  History of sinus surgery in?  2016 per patient from ENT in [facility]      OSA (obstructive sleep apnea)    12/15/2018-home sleep study-AHI 12.1, SaO2 low 83%          Allergies  Allergen Reactions  . Phenergan [Promethazine Hcl] Itching and Other (See Comments)    burning    Immunization History  Administered Date(s) Administered  . Influenza Inj Mdck Quad Pf  05/29/2018  . Influenza Split 02/23/2016  . Influenza,inj,Quad PF,6+ Mos 12/22/2016  . Influenza-Unspecified 02/07/2019  . Pneumococcal Conjugate-13 03/25/2016  . Pneumococcal Polysaccharide-23 07/16/2016  . Tdap 04/02/2016    Past Medical History:  Diagnosis Date  . Allergy   . Arthritis    "left knee" (01/22/2016)  . Asthma   . Chronic lower back pain   . COPD (chronic obstructive pulmonary disease) ([place])   . Dyspnea    with extertion  . High cholesterol   . Hypertension   . Neuropathy   . OSA on CPAP   . Secondhand smoke exposure   . Sleep apnea    wears CPAP, not wearing now  for months  . Type II diabetes mellitus ([place]) dx'd 01/17/2016    Tobacco History: Social History   Tobacco Use  Smoking Status Passive Smoke Exposure - Never Smoker  Smokeless Tobacco Never Used  Tobacco Comment   passive cig smoking at work  1994- 2012  Counseling given: Yes Comment: passive cig smoking at work  1994- 2012  Continue to not smoke  Outpatient Encounter Medications as of 04/05/2019  Medication Sig  . albuterol (VENTOLIN HFA) 108 (90 Base) MCG/ACT inhaler INHALE 1 TO 2 PUFFS BY MOUTH EVERY 6 HOURS AS NEEDED FOR WHEEZING FOR SHORTNESS OF BREATH  . amLODipine (NORVASC) 10 MG tablet Take 1 tablet (10 mg total) by mouth daily.  Marland Kitchen aspirin EC 325 MG tablet Take 1 tablet (325 mg total) by mouth 2 (two) times daily. (Patient taking differently: Take 325 mg by mouth once. )  . Blood Glucose Monitoring Suppl (ONE TOUCH ULTRA 2) w/Device KIT Use as directed 3 times daily E11.9  . cetirizine (ZYRTEC) 10 MG tablet Take 1 tablet (10 mg total) by mouth daily.  . diclofenac sodium (VOLTAREN) 1 % GEL Apply 2 g 4 (four) times daily topically. (Patient taking differently: Apply 2 g topically 4 (four) times daily as needed (for pain.). )  . fluticasone (FLONASE) 50 MCG/ACT nasal spray Place 2 sprays into both nostrils daily.  . Fluticasone-Salmeterol (ADVAIR DISKUS) 500-50 MCG/DOSE AEPB Inhale 1  puff into the lungs 2 (two) times daily.  Marland Kitchen gabapentin (NEURONTIN) 300 MG capsule TAKE 1 CAPSULE BY MOUTH THREE TIMES DAILY  . glucose blood (ONETOUCH VERIO) test strip Use as instructed to check blood sugar daily.  Marland Kitchen ipratropium-albuterol (DUONEB) 0.5-2.5 (3) MG/3ML SOLN Take 3 mLs by nebulization every 4 (four) hours as needed.  Marland Kitchen lisinopril-hydrochlorothiazide (ZESTORETIC) 10-12.5 MG tablet Take 1 tablet by mouth daily.  . metFORMIN (GLUCOPHAGE) 500 MG tablet Take 500 mg by mouth every morning.  . methocarbamol (ROBAXIN) 750 MG tablet Take 1 tablet (750 mg total) by mouth 2 (two) times daily as needed for muscle spasms.  . montelukast (SINGULAIR) 10 MG tablet Take 1 tablet (10 mg total) by mouth at bedtime.  . ondansetron (ZOFRAN) 4 MG tablet Take 1-2 tablets (4-8 mg total) by mouth every 8 (eight) hours as needed for nausea or vomiting.  . pravastatin (PRAVACHOL) 20 MG tablet Take 1 tablet (20 mg total) by mouth at bedtime.  . TRUEPLUS LANCETS 28G MISC Check glucose 2 times a day  . Vitamin D, Ergocalciferol, (DRISDOL) 1.25 MG (50000 UT) CAPS capsule Take 1 capsule (50,000 Units total) by mouth every 7 (seven) days.  . [DISCONTINUED] amoxicillin-clavulanate (AUGMENTIN) 875-125 MG tablet Take 1 tablet by mouth 2 (two) times daily.  . [DISCONTINUED] Fluticasone-Salmeterol (ADVAIR DISKUS) 250-50 MCG/DOSE AEPB Inhale 1 puff into the lungs 2 (two) times daily.  . [DISCONTINUED] predniSONE (DELTASONE) 10 MG tablet 6,5,4,3,2,1 take each days dose in am with food  . [DISCONTINUED] senna-docusate (SENOKOT S) 8.6-50 MG tablet Take 1 tablet by mouth at bedtime as needed.   No facility-administered encounter medications on file as of 04/05/2019.     Review of Systems  Review of Systems  Constitutional: Negative for activity change, chills, fatigue, fever and unexpected weight change.  HENT: Negative for postnasal drip, rhinorrhea, sinus pressure, sinus pain and sore throat.   Eyes: Negative.    Respiratory: Positive for wheezing (occasionally ). Negative for cough and shortness of breath.   Cardiovascular: Negative for chest pain and palpitations.  Gastrointestinal: Negative for constipation, diarrhea, nausea and vomiting.  Endocrine: Negative.   Genitourinary: Negative.   Musculoskeletal: Negative.   Skin: Negative.   Allergic/Immunologic: Positive for environmental allergies.  Neurological: Negative for dizziness and headaches.  Psychiatric/Behavioral: Negative.  Negative for dysphoric mood. The patient is not nervous/anxious.   All  other systems reviewed and are negative.    Physical Exam  BP 126/82 (BP Location: Left Arm, Patient Position: Sitting, Cuff Size: Normal)   Pulse 72   Temp 97.6 F (36.4 C) (Temporal)   Ht '5\' 10"'$  (1.778 m)   Wt 186 lb (84.4 kg)   SpO2 97% Comment: RA  BMI 26.69 kg/m   Wt Readings from Last 5 Encounters:  04/05/19 186 lb (84.4 kg)  03/09/19 189 lb 9.6 oz (86 kg)  11/25/18 182 lb 3.2 oz (82.6 kg)  08/19/18 181 lb 9.6 oz (82.4 kg)  05/28/18 177 lb (80.3 kg)    BMI Readings from Last 5 Encounters:  04/05/19 26.69 kg/m  03/09/19 27.20 kg/m  11/25/18 26.14 kg/m  08/19/18 26.06 kg/m  05/28/18 25.40 kg/m    Physical Exam Vitals signs and nursing note reviewed.  Constitutional:      General: He is not in acute distress.    Appearance: Normal appearance. He is normal weight.  HENT:     Head: Normocephalic and atraumatic.     Right Ear: Hearing, tympanic membrane, ear canal and external ear normal. There is impacted cerumen.     Left Ear: Hearing, tympanic membrane, ear canal and external ear normal. There is impacted cerumen.     Nose: Congestion and rhinorrhea present. No mucosal edema.     Right Turbinates: Not enlarged.     Left Turbinates: Not enlarged.     Mouth/Throat:     Mouth: Mucous membranes are dry.     Pharynx: Oropharynx is clear. No oropharyngeal exudate.  Eyes:     Pupils: Pupils are equal, round, and  reactive to light.  Neck:     Musculoskeletal: Normal range of motion.  Cardiovascular:     Rate and Rhythm: Normal rate and regular rhythm.     Pulses: Normal pulses.     Heart sounds: Normal heart sounds. No murmur.  Pulmonary:     Effort: Pulmonary effort is normal.     Breath sounds: Normal breath sounds. No decreased breath sounds, wheezing or rales.  Abdominal:     General: Bowel sounds are normal. There is no distension.     Palpations: Abdomen is soft.     Tenderness: There is no abdominal tenderness.  Musculoskeletal:     Right lower leg: No edema.     Left lower leg: No edema.  Lymphadenopathy:     Cervical: No cervical adenopathy.  Skin:    General: Skin is warm and dry.     Capillary Refill: Capillary refill takes less than 2 seconds.     Findings: No erythema or rash.  Neurological:     General: No focal deficit present.     Mental Status: He is alert and oriented to person, place, and time.     Motor: No weakness.     Coordination: Coordination normal.     Gait: Gait is intact. Gait normal.  Psychiatric:        Mood and Affect: Mood normal.        Behavior: Behavior normal. Behavior is cooperative.        Thought Content: Thought content normal.        Judgment: Judgment normal.      Assessment & Plan:   Eosinophilic asthma Plan: Start paperwork for Nucala today due to persistent asthma with elevated eosinophil count, elevated IgE, for steroid tapers in last calendar year  Continue Advair 500, hope to decrease Advair 500 down to Advair 250 after  starting biologic medications Continue Singulair Start nasal saline rinses 2 times daily Reschedule PFTs    OSA (obstructive sleep apnea) Plan: Continue CPAP therapy  Allergic rhinitis Plan: We will start Nucala Start nasal saline rinses twice daily Continue Singulair May need to consider referral back to ENT if we continue to have recurrent sinus infections or not well managed with IL-5 biologic start     Return in about 6 weeks (around 05/17/2019), or if symptoms worsen or fail to improve, for Follow up with Dr. Ander Slade, Follow up with Wyn Quaker FNP-C, Follow up for PFT.   Lauraine Rinne, NP 04/05/2019   This appointment was 32 minutes long with over 50% of the time in direct face-to-face patient care, assessment, plan of care, and follow-up.

## 2019-04-05 NOTE — Patient Instructions (Addendum)
You were seen today by Lauraine Rinne, NP  for:   1. Eosinophilic asthma  We will start paperwork today for Nucala  Continue Advair 500  Start nasal saline rinses twice daily  Continue Singulair daily  We will reschedule your pulmonary function test with a Covid test prior  2. Allergic rhinitis, unspecified seasonality, unspecified trigger  Continue Singulair daily Start nasal saline rinses twice daily  3. OSA   We recommend that you continue using your CPAP daily >>>Keep up the hard work using your device >>> Goal should be wearing this for the entire night that you are sleeping, at least 4 to 6 hours  Remember:  . Do not drive or operate heavy machinery if tired or drowsy.  . Please notify the supply company and office if you are unable to use your device regularly due to missing supplies or machine being broken.  . Work on maintaining a healthy weight and following your recommended nutrition plan  . Maintain proper daily exercise and movement  . Maintaining proper use of your device can also help improve management of other chronic illnesses such as: Blood pressure, blood sugars, and weight management.   BiPAP/ CPAP Cleaning:  >>>Clean weekly, with Dawn soap, and bottle brush.  Set up to air dry. >>> Wipe mask out daily with wet wipe or towelette     Follow Up:    Return in about 6 weeks (around 05/17/2019), or if symptoms worsen or fail to improve, for Follow up with Dr. Ander Slade, Follow up with Wyn Quaker FNP-C, Follow up for PFT.   Please do your part to reduce the spread of COVID-19:      Reduce your risk of any infection  and COVID19 by using the similar precautions used for avoiding the common cold or flu:  Marland Kitchen Wash your hands often with soap and warm water for at least 20 seconds.  If soap and water are not readily available, use an alcohol-based hand sanitizer with at least 60% alcohol.  . If coughing or sneezing, cover your mouth and nose by coughing or  sneezing into the elbow areas of your shirt or coat, into a tissue or into your sleeve (not your hands). [name] A MASK when in public  . Avoid shaking hands with others and consider head nods or verbal greetings only. . Avoid touching your eyes, nose, or mouth with unwashed hands.  . Avoid close contact with people who are sick. . Avoid places or events with large numbers of people in one location, like concerts or sporting events. . If you have some symptoms but not all symptoms, continue to monitor at home and seek medical attention if your symptoms worsen. . If you are having a medical emergency, call 911.   [place] / e-Visit: [email or web address]         [facility]: [facility]: [id number]                   [facility]: [id number]     It is flu season:   >>> Best ways to protect herself from the flu: Receive the yearly flu vaccine, practice good hand hygiene washing with soap and also using hand sanitizer when available, eat a nutritious meals, get adequate rest, hydrate appropriately   Please contact the office if your symptoms worsen or you have concerns that you are not improving.   Thank you  for choosing [facility] for your healthcare, and for allowing [place] to partner with you on your healthcare journey. I am thankful to be able to provide care to you today.   Wyn Quaker FNP-C

## 2019-04-05 NOTE — Assessment & Plan Note (Signed)
Plan: We will start Nucala Start nasal saline rinses twice daily Continue Singulair May need to consider referral back to ENT if we continue to have recurrent sinus infections or not well managed with IL-5 biologic start

## 2019-04-05 NOTE — Telephone Encounter (Signed)
Received new start form for Nucala from [place], [place] signed by  Wyn Quaker NP. Form was signed and dated 04/05/19. Form and all required information was faxed by Lattie Haw, LPN on [phone].  Will sign off and await decision.

## 2019-04-05 NOTE — Assessment & Plan Note (Addendum)
Plan: Start paperwork for Nucala today due to persistent asthma with elevated eosinophil count, elevated IgE, for steroid tapers in last calendar year  Continue Advair 500, hope to decrease Advair 500 down to Advair 250 after starting biologic medications Continue Singulair Start nasal saline rinses 2 times daily Reschedule PFTs

## 2019-04-06 NOTE — Telephone Encounter (Signed)
Called [facility] [place]. Nucala instructions were needing verified.  Patient will be getting Nucala 100mg  single dose vial, with 5 refills.   Nucala enrollment is being processed at this time. Will follow up.

## 2019-04-08 NOTE — Telephone Encounter (Signed)
Summary of benefits received. Specialty pharmacy will be [facility]. Called [facility] to initiate Nucala PA for 100mg  single vials for office administration.   Nucala prescription provided verbally to [facility]. Patient approved for Nucala, but will have co pay of $498.30. Will follow up next week with Patient about patient assiatnce. [facility] pharmacy was going to reach out to Patient regarding high co pay.

## 2019-04-11 NOTE — Telephone Encounter (Signed)
ATC pt to follow up on this matter. There was no answer and no option to leave a message. Will try back.

## 2019-04-14 NOTE — Telephone Encounter (Signed)
LMTCB x1 for pt.  

## 2019-04-18 ENCOUNTER — Ambulatory Visit: Payer: Medicare Other | Admitting: Pulmonary Disease

## 2019-04-18 NOTE — Telephone Encounter (Signed)
LMTCB x2 for pt 

## 2019-04-20 NOTE — Telephone Encounter (Signed)
LMTCB x3 for pt. We have attempted to contact pt several times with no success or call back from pt. Per office protocol, message will be closed.   

## 2019-05-02 DIAGNOSIS — G4733 Obstructive sleep apnea (adult) (pediatric): Secondary | ICD-10-CM | POA: Diagnosis not present

## 2019-05-03 DIAGNOSIS — G4733 Obstructive sleep apnea (adult) (pediatric): Secondary | ICD-10-CM | POA: Diagnosis not present

## 2019-05-05 ENCOUNTER — Ambulatory Visit: Payer: Medicare Other | Attending: Family | Admitting: Family

## 2019-05-05 ENCOUNTER — Other Ambulatory Visit: Payer: Self-pay

## 2019-05-05 ENCOUNTER — Encounter: Payer: Self-pay | Admitting: Family

## 2019-05-05 VITALS — BP 153/93 | HR 92 | Resp 16 | Wt 194.0 lb

## 2019-05-05 DIAGNOSIS — J329 Chronic sinusitis, unspecified: Secondary | ICD-10-CM | POA: Diagnosis not present

## 2019-05-05 DIAGNOSIS — J8283 Eosinophilic asthma: Secondary | ICD-10-CM | POA: Diagnosis not present

## 2019-05-05 DIAGNOSIS — Z9989 Dependence on other enabling machines and devices: Secondary | ICD-10-CM | POA: Diagnosis not present

## 2019-05-05 DIAGNOSIS — G4733 Obstructive sleep apnea (adult) (pediatric): Secondary | ICD-10-CM | POA: Diagnosis not present

## 2019-05-05 MED ORDER — PREDNISONE 10 MG PO TABS: ORAL_TABLET | ORAL | 0 refills | Status: DC

## 2019-05-05 NOTE — Progress Notes (Signed)
Patient ID: Christian [name], male    DOB: Mar 23, 1961  MRN: [id number]  CC: "I am wheezing, short of breath, and coughing more lately." "My nose is runny."  Subjective: Christian [name] is a 59 y.o. male who presents for asthma management. His concerns today include: wheezing, shortness of breath, dry cough, red eyes, runny nose, dark yellow mucus when blowing nose for 2 weeks. Loss of appetite and loss of energy for 1 week which he contributes to uncontrolled asthma.   1. Asthma:  Currently taking Albuterol and Advair inhalers each 2x daily. Takes Duoneb every 4 hours daily. Singulair every night. Takes medications consistently without missing any doses. Medications do help some. Wheezing and shortness of breath worse with normal walking. Denies any environmental irritants. Able to complete activities of daily living which includes but not limited to putting on clothes, driving, and household chores. Has a loss of energy from wheezing and shortness of breath. Pulmonologist appointment scheduled for March 2021. 2. Sinusitis:  Nasal saline and Flonase every morning. Runny nose persistent for 2 weeks. Runny nose consistently has clear drainage. Sometimes when blowing nose dark yellow mucus. Concerned about having an infection because of dark yellow mucus when blowing nose. Dry coughing consistently for 2 weeks. Awakens 3 nights per week with dry cough. Denies headache.  3. Obstructive Sleep Apnea: Uses CPAP machine nightly without complication.  Patient Active Problem List   Diagnosis Date Noted  . Allergic rhinitis 04/05/2019  . OSA (obstructive sleep apnea) 04/05/2019  . Presence of left artificial knee joint 08/24/2017  . Eosinophilic asthma 13/24/4010  . Primary osteoarthritis of left knee 02/23/2017  . Chronic pain syndrome 04/02/2016  . Intrinsic asthma 03/25/2016  . Essential hypertension 01/23/2016  . Hyperlipidemia 01/23/2016  . Secondhand smoke exposure 01/23/2016  . Diabetes mellitus  with neuropathy ([place]) 01/23/2016     Current Outpatient Medications on File Prior to Visit  Medication Sig Dispense Refill  . albuterol (VENTOLIN HFA) 108 (90 Base) MCG/ACT inhaler INHALE 1 TO 2 PUFFS BY MOUTH EVERY 6 HOURS AS NEEDED FOR WHEEZING FOR SHORTNESS OF BREATH 9 g 2  . amLODipine (NORVASC) 10 MG tablet Take 1 tablet (10 mg total) by mouth daily. 90 tablet 3  . aspirin EC 325 MG tablet Take 1 tablet (325 mg total) by mouth 2 (two) times daily. (Patient taking differently: Take 325 mg by mouth once. ) 84 tablet 0  . Blood Glucose Monitoring Suppl (ONE TOUCH ULTRA 2) w/Device KIT Use as directed 3 times daily E11.9 1 each 0  . cetirizine (ZYRTEC) 10 MG tablet Take 1 tablet (10 mg total) by mouth daily. 30 tablet 11  . diclofenac sodium (VOLTAREN) 1 % GEL Apply 2 g 4 (four) times daily topically. (Patient taking differently: Apply 2 g topically 4 (four) times daily as needed (for pain.). ) 1 Tube 5  . fluticasone (FLONASE) 50 MCG/ACT nasal spray Place 2 sprays into both nostrils daily. 16 g 6  . Fluticasone-Salmeterol (ADVAIR DISKUS) 500-50 MCG/DOSE AEPB Inhale 1 puff into the lungs 2 (two) times daily. 60 each 3  . gabapentin (NEURONTIN) 300 MG capsule TAKE 1 CAPSULE BY MOUTH THREE TIMES DAILY 90 capsule 3  . glucose blood (ONETOUCH VERIO) test strip Use as instructed to check blood sugar daily. 100 each 12  . ipratropium-albuterol (DUONEB) 0.5-2.5 (3) MG/3ML SOLN Take 3 mLs by nebulization every 4 (four) hours as needed. 360 mL 2  . lisinopril-hydrochlorothiazide (ZESTORETIC) 10-12.5 MG tablet Take 1 tablet by  mouth daily. 90 tablet 3  . metFORMIN (GLUCOPHAGE) 500 MG tablet Take 500 mg by mouth every morning.    . methocarbamol (ROBAXIN) 750 MG tablet Take 1 tablet (750 mg total) by mouth 2 (two) times daily as needed for muscle spasms. 60 tablet 0  . montelukast (SINGULAIR) 10 MG tablet Take 1 tablet (10 mg total) by mouth at bedtime. 90 tablet 1  . ondansetron (ZOFRAN) 4 MG tablet Take  1-2 tablets (4-8 mg total) by mouth every 8 (eight) hours as needed for nausea or vomiting. 40 tablet 0  . pravastatin (PRAVACHOL) 20 MG tablet Take 1 tablet (20 mg total) by mouth at bedtime. 90 tablet 0  . TRUEPLUS LANCETS 28G MISC Check glucose 2 times a day 100 each 3  . Vitamin D, Ergocalciferol, (DRISDOL) 1.25 MG (50000 UT) CAPS capsule Take 1 capsule (50,000 Units total) by mouth every 7 (seven) days. 16 capsule 0   No current facility-administered medications on file prior to visit.    Allergies  Allergen Reactions  . Phenergan [Promethazine Hcl] Itching and Other (See Comments)    burning    Social History   Socioeconomic History  . Marital status: Married    Spouse name: Not on file  . Number of children: Not on file  . Years of education: Not on file  . Highest education level: Not on file  Occupational History  . Not on file  Tobacco Use  . Smoking status: Passive Smoke Exposure - Never Smoker  . Smokeless tobacco: Never Used  . Tobacco comment: passive cig smoking at work  1994- 2012  Substance and Sexual Activity  . Alcohol use: No  . Drug use: No  . Sexual activity: Yes  Other Topics Concern  . Not on file  Social History Narrative  . Not on file   Social Determinants of Health   Financial Resource Strain:   . Difficulty of Paying Living Expenses: Not on file  Food Insecurity:   . Worried About Charity fundraiser in the Last Year: Not on file  . Ran Out of Food in the Last Year: Not on file  Transportation Needs:   . Lack of Transportation (Medical): Not on file  . Lack of Transportation (Non-Medical): Not on file  Physical Activity:   . Days of Exercise per Week: Not on file  . Minutes of Exercise per Session: Not on file  Stress:   . Feeling of Stress : Not on file  Social Connections:   . Frequency of Communication with Friends and Family: Not on file  . Frequency of Social Gatherings with Friends and Family: Not on file  . Attends Religious  Services: Not on file  . Active Member of Clubs or Organizations: Not on file  . Attends Archivist Meetings: Not on file  . Marital Status: Not on file  Intimate Partner Violence:   . Fear of Current or Ex-Partner: Not on file  . Emotionally Abused: Not on file  . Physically Abused: Not on file  . Sexually Abused: Not on file    Family History  Problem Relation Age of Onset  . Hypertension Mother   . Hypertension Father   . Heart Problems Brother        Heart transplant, pt does not know cause   . Rectal cancer Brother   . Colon cancer Neg Hx   . Esophageal cancer Neg Hx   . Stomach cancer Neg Hx     Past  Surgical History:  Procedure Laterality Date  . ANTERIOR CERVICAL DECOMP/DISCECTOMY FUSION  ~ 1995   "titanium"  . APPENDECTOMY  ~ 1977  . BACK SURGERY     x 2  L 4disectomy  . COLONOSCOPY W/ POLYPECTOMY    . INGUINAL HERNIA REPAIR Right ~ 2000  . LUMBAR DISC SURGERY  ~ 2000; ~2006  . NASAL SINUS SURGERY  ~ 2007  . TOTAL KNEE ARTHROPLASTY Left 08/06/2017   Procedure: LEFT TOTAL KNEE ARTHROPLASTY;  Surgeon: Leandrew Koyanagi, MD;  Location: [facility];  Service: Orthopedics;  Laterality: Left;    ROS: Review of Systems Constitutional: Denies fever. Denies chills. Denies weakness. Denies weight changes. Admits fatigue related to wheezing and shortness of breath. Cardiovascular: Denies chest pain. Denies chest discomfort. Denies palpitations. Denies murmurs. Respiratory: Admits wheezing. Admits shortness of breath. Admits cough. Admits asthma. HEENNT: Denies sinus pain. Denies sinus pressure. Denies sore throat.    Physical Exam  There were no vitals taken for this visit. General appearance - alert, well appearing, and in no distress, oriented to person, place, and time and normal appearing weight Ears - bilateral TM's and external ear canals normal, right ear normal, left ear normal Nose - normal nontender sinuses, mucosal congestion, mucosal pallor and clear  rhinorrhea Mouth - mucous membranes moist, pharynx normal without lesions, tonsils normal and tongue normal Neck - supple, no significant adenopathy Lymphatics - no palpable lymphadenopathy Chest - clear to auscultation, no wheezes, rales or rhonchi, symmetric air entry Heart - normal rate, regular rhythm, normal S1, S2, no murmurs, rubs, clicks or gallops  CMP Latest Ref Rng & Units 09/14/2018 04/06/2018 08/07/2017  Glucose 65 - 99 mg/dL 122(H) 129(H) 162(H)  BUN 6 - 24 mg/dL _0 Creatinine 0.76 - 1.27 mg/dL 1.28(H) 1.41(H) 1.00  Sodium 134 - 144 mmol/L 139 142 136  Potassium 3.5 - 5.2 mmol/L 3.8 4.5 3.5  Chloride 96 - 106 mmol/L 104 109 103  CO2 20 - 29 mmol/L _1 Calcium 8.7 - 10.2 mg/dL 8.3(L) 8.5(L) 8.3(L)  Total Protein 6.0 - 8.5 g/dL 5.2(L) - -  Total Bilirubin 0.0 - 1.2 mg/dL 0.8 - -  Alkaline Phos 39 - 117 IU/L 30(L) - -  AST 0 - 40 IU/L 15 - -  ALT 0 - 44 IU/L 16 - -   Lipid Panel     Component Value Date/Time   CHOL 167 09/14/2018 1047   TRIG 43 09/14/2018 1047   HDL 63 09/14/2018 1047   CHOLHDL 2.7 09/14/2018 1047   CHOLHDL 2.4 03/05/2016 0824   VLDL 12 03/05/2016 0824   LDLCALC 95 09/14/2018 1047    CBC    Component Value Date/Time   WBC 5.3 10/06/2018 1610   RBC 4.79 10/06/2018 1610   HGB 14.2 10/06/2018 1610   HGB 16.2 08/19/2018 1441   HCT 42.9 10/06/2018 1610   HCT 48.8 08/19/2018 1441   PLT 255.0 10/06/2018 1610   PLT 266 08/19/2018 1441   MCV 89.6 10/06/2018 1610   MCV 88 08/19/2018 1441   MCH 29.2 08/19/2018 1441   MCH 28.0 04/06/2018 0909   MCHC 33.2 10/06/2018 1610   RDW 13.7 10/06/2018 1610   RDW 13.3 08/19/2018 1441   LYMPHSABS 1.6 10/06/2018 1610   LYMPHSABS 2.1 08/19/2018 1441   MONOABS 0.6 10/06/2018 1610   EOSABS 0.4 10/06/2018 1610   EOSABS 0.5 (H) 08/19/2018 1441   BASOSABS 0.0 10/06/2018 1610   BASOSABS 0.1 08/19/2018 1441  ASSESSMENT AND PLAN: 1. Eosinophilic Asthma: Prednisone taper for management of worsening  asthma symptoms. Continue Albuterol, Advair, and Duoneb.Return in 6 weeks for follow-up or sooner if symptoms worsen. Patient scheduled for Pulmonologist appointment in March 2021.  2. Sinusitis: Referral to Ear, Nose, Throat specialist for management of chronic sinusitis. Antibiotic not indicated at this time. Continue saline irrigation and Flonase. 3. Obstructive Sleep Apnea:  Continue CPAP nightly.  I discussed the assessment and treatment plan with the patient. Patient was given the opportunity to ask questions. Patient verbalized understanding of the plan and was able to repeat key elements of the plan.   The patient was advised to call back or seek in-person evaluation if symptoms worsen or if the symptoms fail to improve as indicated.  I provided 45 minutes of face-to-face time during this visit which includes diagnosis, treatment, and coordination of care.   Camillia Herter, NP

## 2019-05-05 NOTE — Patient Instructions (Signed)
Prednisone for asthma management. Keep appointment with pulmonary specialist. Referral to Ear, Nose, Throat Specialist for sinus concerns.  Asthma, Adult  Asthma is a long-term (chronic) condition in which the airways get tight and narrow. The airways are the breathing passages that lead from the nose and mouth down into the lungs. A person with asthma will have times when symptoms get worse. These are called asthma attacks. They can cause coughing, whistling sounds when you breathe (wheezing), shortness of breath, and chest pain. They can make it hard to breathe. There is no cure for asthma, but medicines and lifestyle changes can help control it. There are many things that can bring on an asthma attack or make asthma symptoms worse (triggers). Common triggers include:  Mold.  Dust.  Cigarette smoke.  Cockroaches.  Things that can cause allergy symptoms (allergens). These include animal skin flakes (dander) and pollen from trees or grass.  Things that pollute the air. These may include household cleaners, wood smoke, smog, or chemical odors.  Cold air, weather changes, and wind.  Crying or laughing hard.  Stress.  Certain medicines or drugs.  Certain foods such as dried fruit, potato chips, and grape juice.  Infections, such as a cold or the flu.  Certain medical conditions or diseases.  Exercise or tiring activities. Asthma may be treated with medicines and by staying away from the things that cause asthma attacks. Types of medicines may include:  Controller medicines. These help prevent asthma symptoms. They are usually taken every day.  Fast-acting reliever or rescue medicines. These quickly relieve asthma symptoms. They are used as needed and provide short-term relief.  Allergy medicines if your attacks are brought on by allergens.  Medicines to help control the body's defense (immune) system. Follow these instructions at home: Avoiding triggers in your home  Change  your heating and air conditioning filter often.  Limit your use of fireplaces and wood stoves.  Get rid of pests (such as roaches and mice) and their droppings.  Throw away plants if you see mold on them.  Clean your floors. Dust regularly. Use cleaning products that do not smell.  Have someone vacuum when you are not home. Use a vacuum cleaner with a HEPA filter if possible.  Replace carpet with wood, tile, or vinyl flooring. Carpet can trap animal skin flakes and dust.  Use allergy-proof pillows, mattress covers, and box spring covers.  Wash bed sheets and blankets every week in hot water. Dry them in a dryer.  Keep your bedroom free of any triggers.  Avoid pets and keep windows closed when things that cause allergy symptoms are in the air.  Use blankets that are made of polyester or cotton.  Clean bathrooms and kitchens with bleach. If possible, have someone repaint the walls in these rooms with mold-resistant paint. Keep out of the rooms that are being cleaned and painted.  Wash your hands often with soap and water. If soap and water are not available, use hand sanitizer.  Do not allow anyone to smoke in your home. General instructions  Take over-the-counter and prescription medicines only as told by your doctor. ? Talk with your doctor if you have questions about how or when to take your medicines. ? Make note if you need to use your medicines more often than usual.  Do not use any products that contain nicotine or tobacco, such as cigarettes and e-cigarettes. If you need help quitting, ask your doctor.  Stay away from secondhand smoke.  Avoid  doing things outdoors when allergen counts are high and when air quality is low.  Wear a ski mask when doing outdoor activities in the winter. The mask should cover your nose and mouth. Exercise indoors on cold days if you can.  Warm up before you exercise. Take time to cool down after exercise.  Use a peak flow meter as told  by your doctor. A peak flow meter is a tool that measures how well the lungs are working.  Keep track of the peak flow meter's readings. Write them down.  Follow your asthma action plan. This is a written plan for taking care of your asthma and treating your attacks.  Make sure you get all the shots (vaccines) that your doctor recommends. Ask your doctor about a flu shot and a pneumonia shot.  Keep all follow-up visits as told by your doctor. This is important. Contact a doctor if:  You have wheezing, shortness of breath, or a cough even while taking medicine to prevent attacks.  The mucus you cough up (sputum) is thicker than usual.  The mucus you cough up changes from clear or white to yellow, green, gray, or bloody.  You have problems from the medicine you are taking, such as: ? A rash. ? Itching. ? Swelling. ? Trouble breathing.  You need reliever medicines more than 2-3 times a week.  Your peak flow reading is still at 50-79% of your personal best after following the action plan for 1 hour.  You have a fever. Get help right away if:  You seem to be worse and are not responding to medicine during an asthma attack.  You are short of breath even at rest.  You get short of breath when doing very little activity.  You have trouble eating, drinking, or talking.  You have chest pain or tightness.  You have a fast heartbeat.  Your lips or fingernails start to turn blue.  You are light-headed or dizzy, or you faint.  Your peak flow is less than 50% of your personal best.  You feel too tired to breathe normally. Summary  Asthma is a long-term (chronic) condition in which the airways get tight and narrow. An asthma attack can make it hard to breathe.  Asthma cannot be cured, but medicines and lifestyle changes can help control it.  Make sure you understand how to avoid triggers and how and when to use your medicines. This information is not intended to replace advice  given to you by your health care provider. Make sure you discuss any questions you have with your health care provider. Document Revised: 06/17/2018 Document Reviewed: 05/19/2016 Elsevier Patient Education  2020 [employer].

## 2019-05-06 ENCOUNTER — Telehealth: Payer: Self-pay | Admitting: Internal Medicine

## 2019-05-06 NOTE — Telephone Encounter (Signed)
Patient called to let us know that he was here yesterday  OV and he find out at the end of the day  That his Christian [name] is covid  positive and [name] has an appointment on Sunday to get tested.

## 2019-05-06 NOTE — Telephone Encounter (Signed)
I spoke with both the [name] and nurse practitioner who saw this patient.  They were both wearing masks and eye shield and a currently asymptomatic.

## 2019-05-13 ENCOUNTER — Ambulatory Visit: Payer: Medicare Other | Admitting: Internal Medicine

## 2019-05-16 ENCOUNTER — Other Ambulatory Visit (HOSPITAL_COMMUNITY): Payer: Medicare Other

## 2019-05-21 ENCOUNTER — Other Ambulatory Visit (HOSPITAL_COMMUNITY)
Admission: RE | Admit: 2019-05-21 | Discharge: 2019-05-21 | Disposition: A | Discharge status: Home / Self Care | Payer: Medicare Other | Source: Ambulatory Visit | Attending: Pulmonary Disease | Admitting: Pulmonary Disease

## 2019-05-21 DIAGNOSIS — Z20822 Contact with and (suspected) exposure to covid-19: Secondary | ICD-10-CM | POA: Diagnosis not present

## 2019-05-21 DIAGNOSIS — Z01812 Encounter for preprocedural laboratory examination: Secondary | ICD-10-CM | POA: Diagnosis not present

## 2019-05-21 LAB — SARS CORONAVIRUS 2 (TAT 6-24 HRS): SARS Coronavirus 2: NEGATIVE

## 2019-05-23 ENCOUNTER — Ambulatory Visit (INDEPENDENT_AMBULATORY_CARE_PROVIDER_SITE_OTHER): Payer: Medicare Other | Admitting: Otolaryngology

## 2019-05-23 ENCOUNTER — Other Ambulatory Visit: Payer: Self-pay

## 2019-05-23 VITALS — Temp 98.1°F

## 2019-05-23 DIAGNOSIS — J3089 Other allergic rhinitis: Secondary | ICD-10-CM | POA: Diagnosis not present

## 2019-05-23 DIAGNOSIS — J310 Chronic rhinitis: Secondary | ICD-10-CM

## 2019-05-23 DIAGNOSIS — J339 Nasal polyp, unspecified: Secondary | ICD-10-CM

## 2019-05-23 NOTE — Progress Notes (Signed)
HPI: Christian [name] is a 59 y.o. male who presents is referred by his PCP for evaluation of chronic sinusitis.  His main complaints today seems to be mostly drainage from his nose.  The drainage is generally clear.  He does have history of previous nasal surgery over 15 years ago.  He does not describe that much trouble breathing through his nose mostly just the chronic drainage from his nose.  He also has history of allergies and asthma. He also uses CPAP for obstructive sleep apnea. The drainage from his nose may come anytime and is generally clear. He uses Flonase in the morning.  He is also on Singulair and Zyrtec.Marland Kitchen  Past Medical History:  Diagnosis Date  . Allergy   . Arthritis    "left knee" (01/22/2016)  . Asthma   . Chronic lower back pain   . COPD (chronic obstructive pulmonary disease) ([place])   . Dyspnea    with extertion  . High cholesterol   . Hypertension   . Neuropathy   . OSA on CPAP   . Secondhand smoke exposure   . Sleep apnea    wears CPAP, not wearing now  for months  . Type II diabetes mellitus ([place]) dx'd 01/17/2016   Past Surgical History:  Procedure Laterality Date  . ANTERIOR CERVICAL DECOMP/DISCECTOMY FUSION  ~ 1995   "titanium"  . APPENDECTOMY  ~ 1977  . BACK SURGERY     x 2  L 4disectomy  . COLONOSCOPY W/ POLYPECTOMY    . INGUINAL HERNIA REPAIR Right ~ 2000  . LUMBAR DISC SURGERY  ~ 2000; ~2006  . NASAL SINUS SURGERY  ~ 2007  . TOTAL KNEE ARTHROPLASTY Left 08/06/2017   Procedure: LEFT TOTAL KNEE ARTHROPLASTY;  Surgeon: Leandrew Koyanagi, MD;  Location: [place];  Service: Orthopedics;  Laterality: Left;   Social History   Socioeconomic History  . Marital status: Married    Spouse name: Not on file  . Number of children: Not on file  . Years of education: Not on file  . Highest education level: Not on file  Occupational History  . Not on file  Tobacco Use  . Smoking status: Passive Smoke Exposure - Never Smoker  . Smokeless tobacco: Never Used  .  Tobacco comment: passive cig smoking at work  1994- 2012  Substance and Sexual Activity  . Alcohol use: No  . Drug use: No  . Sexual activity: Yes  Other Topics Concern  . Not on file  Social History Narrative  . Not on file   Social Determinants of Health   Financial Resource Strain:   . Difficulty of Paying Living Expenses: Not on file  Food Insecurity:   . Worried About Charity fundraiser in the Last Year: Not on file  . Ran Out of Food in the Last Year: Not on file  Transportation Needs:   . Lack of Transportation (Medical): Not on file  . Lack of Transportation (Non-Medical): Not on file  Physical Activity:   . Days of Exercise per Week: Not on file  . Minutes of Exercise per Session: Not on file  Stress:   . Feeling of Stress : Not on file  Social Connections:   . Frequency of Communication with Friends and Family: Not on file  . Frequency of Social Gatherings with Friends and Family: Not on file  . Attends Religious Services: Not on file  . Active Member of Clubs or Organizations: Not on file  .  Attends Archivist Meetings: Not on file  . Marital Status: Not on file   Family History  Problem Relation Age of Onset  . Hypertension Mother   . Hypertension Father   . Heart Problems Brother        Heart transplant, pt does not know cause   . Rectal cancer Brother   . Colon cancer Neg Hx   . Esophageal cancer Neg Hx   . Stomach cancer Neg Hx    Allergies  Allergen Reactions  . Phenergan [Promethazine Hcl] Itching and Other (See Comments)    burning   Prior to Admission medications   Medication Sig Start Date End Date Taking? Authorizing Provider  albuterol (VENTOLIN HFA) 108 (90 Base) MCG/ACT inhaler INHALE 1 TO 2 PUFFS BY MOUTH EVERY 6 HOURS AS NEEDED FOR WHEEZING FOR SHORTNESS OF BREATH 03/11/19  Yes Ladell Pier, MD  amLODipine (NORVASC) 10 MG tablet Take 1 tablet (10 mg total) by mouth daily. 11/17/18  Yes Argentina Donovan, PA-C  aspirin EC  325 MG tablet Take 1 tablet (325 mg total) by mouth 2 (two) times daily. Patient taking differently: Take 325 mg by mouth once.  08/06/17  Yes Leandrew Koyanagi, MD  Blood Glucose Monitoring Suppl (ONE TOUCH ULTRA 2) w/Device KIT Use as directed 3 times daily E11.9 03/31/17  Yes Ladell Pier, MD  cetirizine (ZYRTEC) 10 MG tablet Take 1 tablet (10 mg total) by mouth daily. 11/17/18  Yes Argentina Donovan, PA-C  diclofenac sodium (VOLTAREN) 1 % GEL Apply 2 g 4 (four) times daily topically. Patient taking differently: Apply 2 g topically 4 (four) times daily as needed (for pain.).  03/03/17  Yes Leandrew Koyanagi, MD  fluticasone (FLONASE) 50 MCG/ACT nasal spray Place 2 sprays into both nostrils daily. 01/19/19  Yes McClung, Angela M, PA-C  Fluticasone-Salmeterol (ADVAIR DISKUS) 500-50 MCG/DOSE AEPB Inhale 1 puff into the lungs 2 (two) times daily. 03/09/19  Yes Olalere, Adewale A, MD  gabapentin (NEURONTIN) 300 MG capsule TAKE 1 CAPSULE BY MOUTH THREE TIMES DAILY 02/01/19  Yes Ladell Pier, MD  glucose blood (ONETOUCH VERIO) test strip Use as instructed to check blood sugar daily. 01/20/19  Yes Ladell Pier, MD  ipratropium-albuterol (DUONEB) 0.5-2.5 (3) MG/3ML SOLN Take 3 mLs by nebulization every 4 (four) hours as needed. 11/17/18  Yes McClung, Angela M, PA-C  lisinopril-hydrochlorothiazide (ZESTORETIC) 10-12.5 MG tablet Take 1 tablet by mouth daily. 11/17/18  Yes Freeman Caldron M, PA-C  metFORMIN (GLUCOPHAGE) 500 MG tablet Take 500 mg by mouth every morning. 03/22/19  Yes [provider]  methocarbamol (ROBAXIN) 750 MG tablet Take 1 tablet (750 mg total) by mouth 2 (two) times daily as needed for muscle spasms. 08/06/17  Yes Leandrew Koyanagi, MD  montelukast (SINGULAIR) 10 MG tablet Take 1 tablet (10 mg total) by mouth at bedtime. 03/02/19  Yes Charlott Rakes, MD  ondansetron (ZOFRAN) 4 MG tablet Take 1-2 tablets (4-8 mg total) by mouth every 8 (eight) hours as needed for nausea or vomiting.  08/06/17  Yes Leandrew Koyanagi, MD  pravastatin (PRAVACHOL) 20 MG tablet Take 1 tablet (20 mg total) by mouth at bedtime. 11/17/18  Yes McClung, Angela M, PA-C  predniSONE (DELTASONE) 10 MG tablet Take 6, 5, 4, 3, 2, 1 tablets po each day dose in am with food. 05/05/19  Yes Camillia Herter, NP  TRUEPLUS LANCETS 28G MISC Check glucose 2 times a day 02/01/16  Yes Langeland, Dawn T,  MD  Vitamin D, Ergocalciferol, (DRISDOL) 1.25 MG (50000 UT) CAPS capsule Take 1 capsule (50,000 Units total) by mouth every 7 (seven) days. 08/23/18  Yes Argentina Donovan, PA-C     Positive ROS: Otherwise negative.  All other systems have been reviewed and were otherwise negative with the exception of those mentioned in the HPI and as above.  Physical Exam: Constitutional: Alert, well-appearing, no acute distress Ears: External ears without lesions or tenderness. Ear canals are clear bilaterally with intact, clear TMs.  Nasal: External nose without lesions. Septum relatively midline with minimal deformity.. Nasal endoscopy was performed in the office today.  On nasal endoscopy patient has had previous sinus surgery with widely patent middle meatus bilaterally.  He has a moderate amount of small recurrent nasal polyps.  But these are not obstructing the nasal airway.  No evidence of acute mucopurulent discharge.  No significant drainage noted from the nostrils in the office today. Oral: Lips and gums without lesions. Tongue and palate mucosa without lesions. Posterior oropharynx clear. Neck: No palpable adenopathy or masses Respiratory: Breathing comfortably  Skin: No facial/neck lesions or rash noted.  Nasal/sinus endoscopy  Date/Time: 05/23/2019 5:46 PM Performed by: Rozetta Nunnery, MD Authorized by: Rozetta Nunnery, MD   Consent:    Consent obtained:  Verbal   Consent given by:  Patient   Risks discussed:  Pain Procedure details:    Indications: sino-nasal symptoms     Medication:  Afrin    Instrument: flexible fiberoptic nasal endoscope     Scope location: bilateral nare   Septum:    normal   Sinus:    polyps     polyps     Right nasopharynx: normal     Left nasopharynx: normal   Comments:     Patient has had previous sinus surgery with a small recurrent sinonasal polyps bilaterally.  These are not really obstructing his nasal airway.    Assessment: Chronic allergic rhinitis with history of asthma. Patient has had previous sinus surgery with early recurrence of small sinonasal polyps  Plan: Would recommend regular use of nasal steroid spray either Nasacort or Flonase 2 sprays each nostril at night.  Continue with antihistamine and Singulair as prescribed by allergist. Would not recommend any intervention unless he has trouble breathing through his nose.  Presently no signs of active infection requiring antibiotic therapy. He will follow-up in 3 months for recheck.   Radene Journey, MD   CC:

## 2019-05-25 ENCOUNTER — Other Ambulatory Visit: Payer: Self-pay

## 2019-05-25 ENCOUNTER — Ambulatory Visit (INDEPENDENT_AMBULATORY_CARE_PROVIDER_SITE_OTHER): Payer: Medicare Other | Admitting: Pulmonary Disease

## 2019-05-25 DIAGNOSIS — J45909 Unspecified asthma, uncomplicated: Secondary | ICD-10-CM

## 2019-05-25 LAB — PULMONARY FUNCTION TEST
DL/VA % pred: 106 %
DL/VA: 4.55 ml/min/mmHg/L
DLCO unc % pred: 89 %
DLCO unc: 23.44 ml/min/mmHg
FEF 25-75 Post: 2.07 L/sec
FEF 25-75 Pre: 1.36 L/sec
FEF2575-%Change-Post: 51 %
FEF2575-%Pred-Post: 74 %
FEF2575-%Pred-Pre: 49 %
FEV1-%Change-Post: 11 %
FEV1-%Pred-Post: 84 %
FEV1-%Pred-Pre: 75 %
FEV1-Post: 2.46 L
FEV1-Pre: 2.2 L
FEV1FVC-%Change-Post: 8 %
FEV1FVC-%Pred-Pre: 86 %
FEV6-%Change-Post: 4 %
FEV6-%Pred-Post: 92 %
FEV6-%Pred-Pre: 88 %
FEV6-Post: 3.32 L
FEV6-Pre: 3.19 L
FEV6FVC-%Change-Post: 0 %
FEV6FVC-%Pred-Post: 103 %
FEV6FVC-%Pred-Pre: 102 %
FVC-%Change-Post: 3 %
FVC-%Pred-Post: 89 %
FVC-%Pred-Pre: 86 %
FVC-Post: 3.33 L
FVC-Pre: 3.22 L
Post FEV1/FVC ratio: 74 %
Post FEV6/FVC ratio: 100 %
Pre FEV1/FVC ratio: 68 %
Pre FEV6/FVC Ratio: 99 %
RV % pred: 95 %
RV: 2.01 L
TLC % pred: 85 %
TLC: 5.63 L

## 2019-05-26 NOTE — Progress Notes (Signed)
PFT done.  

## 2019-06-02 DIAGNOSIS — G4733 Obstructive sleep apnea (adult) (pediatric): Secondary | ICD-10-CM | POA: Diagnosis not present

## 2019-06-07 ENCOUNTER — Other Ambulatory Visit: Payer: Self-pay

## 2019-06-07 ENCOUNTER — Telehealth: Payer: Self-pay | Admitting: Pulmonary Disease

## 2019-06-07 NOTE — Telephone Encounter (Signed)
Called spoke with patient who wanted to update Dr Ander Slade on his ENT visit with Dr Lucia Gaskins on 1.25.21; the assessment and plan from that visit have been copied/pasted below:  Assessment: Chronic allergic rhinitis with history of asthma. Patient has had previous sinus surgery with early recurrence of small sinonasal polyps  Plan: Would recommend regular use of nasal steroid spray either Nasacort or Flonase 2 sprays each nostril at night.  Continue with antihistamine and Singulair as prescribed by allergist. Would not recommend any intervention unless he has trouble breathing through his nose.  Presently no signs of active infection requiring antibiotic therapy. He will follow-up in 3 months for recheck.  Radene Journey, MD     Also: 1. Patient is asking for his 1.27.21 PFT results.  Dr Ander Slade please advise. 2. Patient asked for an update on his Nucala start that was ordered at his last office visit on 12.8.2020 with Aaron Edelman NP.  Advised patient that per the 12.8.2020 phone note, the process was begun and this office tried to contact him x3 with no call back and process may be need to be started again from scratch.  Will ask Lattie Haw T to be sure on this. Patient stated home phone may be better to reach him on, spouse can relay message to him.  Routing to Dr Ander Slade regarding #1 Routing to Severance T regarding #2

## 2019-06-07 NOTE — Telephone Encounter (Signed)
Pt calling about results from his appt. at the ear, nose, throat. Pt can be reached at [phone] cell or [phone] home.

## 2019-06-07 NOTE — Patient Outreach (Signed)
[place] [facility]) Care Management  06/07/2019  Avien Junior Blubaugh 07-07-1960 [id number]   Medication Adherence call to Mr. Christian [name] Hippa Identifiers Verify spoke with patient he is due on Pravastatin 20 mg,patient explain he take 1 tablet daily and has enough for another month,patient said he receives a 90 days supply. Mr. Brissey is showing past due under [place].   [place] Management Direct Dial [phone]  Fax [phone] [email or web address]

## 2019-06-08 NOTE — Telephone Encounter (Signed)
LMTCB x1 for pt.  

## 2019-06-08 NOTE — Telephone Encounter (Signed)
Reviewed the PFT  PFT shows small airway obstruction There is significant bronchodilator response in the small airways  Management plan will be to continue Advair as already prescribed

## 2019-06-09 NOTE — Telephone Encounter (Signed)
Nucala was never started, because of high co pay.  Attempts were made to contact Patient, but Patient never returned calls about Nucala. Patient co pay was $498.30.  Attempted to call Patient.  LM for Patient to call office back.

## 2019-06-09 NOTE — Telephone Encounter (Addendum)
Advised pt of results. Pt understood. He wanted to ask Dr. Ander Slade about an injection he mentioned that could help his breathing. It looks like Aaron Edelman started the process for Nucala. Please advise on status.    Patient Instructions by Lauraine Rinne, NP at 04/05/2019 10:00 AM Author: Lauraine Rinne, NP Author Type: Nurse Practitioner Filed: 04/05/2019 9:50 AM  Note Status: Addendum Cosign: Cosign Not Required Encounter Date: 04/05/2019  Editor: Lauraine Rinne, NP (Nurse Practitioner)  Prior Versions: 1. Lauraine Rinne, NP (Nurse Practitioner) at 04/05/2019 9:50 AM - Addendum   2. Lauraine Rinne, NP (Nurse Practitioner) at 04/05/2019 9:49 AM - Addendum   3. Lauraine Rinne, NP (Nurse Practitioner) at 04/05/2019 9:49 AM - Signed    You were seen today by Lauraine Rinne, NP  for:   1. Eosinophilic asthma  We will start paperwork today for Nucala  Continue Advair 500  Start nasal saline rinses twice daily  Continue Singulair daily  We will reschedule your pulmonary function test with a Covid test prior

## 2019-06-10 ENCOUNTER — Telehealth: Payer: Self-pay | Admitting: Pulmonary Disease

## 2019-06-10 NOTE — Telephone Encounter (Signed)
Called and spoke with Patient.  Patient given Nucala assistance information and contact number. Patient stated he would follow up.

## 2019-06-10 NOTE — Telephone Encounter (Signed)
See other note

## 2019-06-16 ENCOUNTER — Ambulatory Visit: Payer: Medicare Other | Admitting: Internal Medicine

## 2019-06-20 ENCOUNTER — Ambulatory Visit: Payer: Medicare Other | Attending: Internal Medicine | Admitting: Internal Medicine

## 2019-06-20 ENCOUNTER — Other Ambulatory Visit: Payer: Self-pay

## 2019-06-20 VITALS — BP 149/57 | HR 89 | Temp 97.8°F | Resp 16 | Wt 193.4 lb

## 2019-06-20 DIAGNOSIS — J339 Nasal polyp, unspecified: Secondary | ICD-10-CM | POA: Diagnosis not present

## 2019-06-20 DIAGNOSIS — Z9989 Dependence on other enabling machines and devices: Secondary | ICD-10-CM

## 2019-06-20 DIAGNOSIS — E1140 Type 2 diabetes mellitus with diabetic neuropathy, unspecified: Secondary | ICD-10-CM

## 2019-06-20 DIAGNOSIS — J8283 Eosinophilic asthma: Secondary | ICD-10-CM

## 2019-06-20 DIAGNOSIS — I10 Essential (primary) hypertension: Secondary | ICD-10-CM

## 2019-06-20 DIAGNOSIS — J309 Allergic rhinitis, unspecified: Secondary | ICD-10-CM

## 2019-06-20 DIAGNOSIS — G4733 Obstructive sleep apnea (adult) (pediatric): Secondary | ICD-10-CM

## 2019-06-20 DIAGNOSIS — E119 Type 2 diabetes mellitus without complications: Secondary | ICD-10-CM

## 2019-06-20 LAB — POCT GLYCOSYLATED HEMOGLOBIN (HGB A1C): HbA1c, POC (prediabetic range): 6.3 % (ref 5.7–6.4)

## 2019-06-20 LAB — GLUCOSE, POCT (MANUAL RESULT ENTRY): POC Glucose: 144 mg/dl — AB (ref 70–99)

## 2019-06-20 NOTE — Patient Instructions (Signed)
Purchase the Zyrtec over-the-counter and take 1 every evening. Your blood pressure is not at goal.  Please take your blood pressure medications when you return home today and take every day.

## 2019-06-20 NOTE — Progress Notes (Signed)
Pt states he thinks the cough, wheezing and sob is coming from his asthma   Pt states he feels okay

## 2019-06-20 NOTE — Progress Notes (Signed)
Patient ID: Christian [name], male    DOB: 02-18-1961  MRN: [id number]  CC: Diabetes, Hypertension, Wheezing, Cough, and Shortness of Breath   Subjective: Christian [name] is a 59 y.o. male who presents for chronic ds management His concerns today include:  History of HTN, DM type II, HL, moderate persistent asthma, vit D def, OSA on CPAP  Asthma:  Saw pulmonary NP 03/2019 and had recent PFT. PFT showed small airway obstruction.  Process for applying for Nucala to be covered by his insurance was started. Saw ENT Dr. Lucia Gaskins last month.  Diagnosed with chronic allergic rhinitis.  He has recurrence of small sinonasal polyps.  He recommended regular use of nasal steroid spray at bedtime and continue with antihistamine and Singulair.  He did not recommend any intervention in terms of the polyps unless he has problems breathing through his nose. -pt feels he is doing okay with Advair, Flonase.  Out of Zyrtec ger OTC. Uses duo Neb 2-3 x a day Gets SOB easily.  Feels better after taking treatment. No fever. No increase cough.  Yellow mucous when he blows nose.  No facial pain.  Thinks he has sinus infection and wants abx,. -using CPAP but not if he feels stuffiness/congestion in nose  HTN:  Compliant with meds and low salt diet.  Reports that he has taken meds already for today.   Not checking BP but does have device Chest pains.  No lower extremity edema.  DM: taking Metformin Check BS every morning.  Range 100-115 Doing okay with eating habits.  Eats twice a day   Patient Active Problem List   Diagnosis Date Noted  . Allergic rhinitis 04/05/2019  . OSA (obstructive sleep apnea) 04/05/2019  . Presence of left artificial knee joint 08/24/2017  . Eosinophilic asthma 74/16/3845  . Primary osteoarthritis of left knee 02/23/2017  . Chronic pain syndrome 04/02/2016  . Intrinsic asthma 03/25/2016  . Essential hypertension 01/23/2016  . Hyperlipidemia 01/23/2016  . Secondhand smoke exposure  01/23/2016  . Diabetes mellitus with neuropathy ([place]) 01/23/2016     Current Outpatient Medications on File Prior to Visit  Medication Sig Dispense Refill  . albuterol (VENTOLIN HFA) 108 (90 Base) MCG/ACT inhaler INHALE 1 TO 2 PUFFS BY MOUTH EVERY 6 HOURS AS NEEDED FOR WHEEZING FOR SHORTNESS OF BREATH 9 g 2  . amLODipine (NORVASC) 10 MG tablet Take 1 tablet (10 mg total) by mouth daily. 90 tablet 3  . aspirin EC 325 MG tablet Take 1 tablet (325 mg total) by mouth 2 (two) times daily. (Patient taking differently: Take 325 mg by mouth once. ) 84 tablet 0  . Blood Glucose Monitoring Suppl (ONE TOUCH ULTRA 2) w/Device KIT Use as directed 3 times daily E11.9 1 each 0  . cetirizine (ZYRTEC) 10 MG tablet Take 1 tablet (10 mg total) by mouth daily. 30 tablet 11  . diclofenac sodium (VOLTAREN) 1 % GEL Apply 2 g 4 (four) times daily topically. (Patient taking differently: Apply 2 g topically 4 (four) times daily as needed (for pain.). ) 1 Tube 5  . fluticasone (FLONASE) 50 MCG/ACT nasal spray Place 2 sprays into both nostrils daily. 16 g 6  . Fluticasone-Salmeterol (ADVAIR DISKUS) 500-50 MCG/DOSE AEPB Inhale 1 puff into the lungs 2 (two) times daily. 60 each 3  . gabapentin (NEURONTIN) 300 MG capsule TAKE 1 CAPSULE BY MOUTH THREE TIMES DAILY 90 capsule 3  . glucose blood (ONETOUCH VERIO) test strip Use as instructed to check blood sugar  daily. 100 each 12  . ipratropium-albuterol (DUONEB) 0.5-2.5 (3) MG/3ML SOLN Take 3 mLs by nebulization every 4 (four) hours as needed. 360 mL 2  . lisinopril-hydrochlorothiazide (ZESTORETIC) 10-12.5 MG tablet Take 1 tablet by mouth daily. 90 tablet 3  . metFORMIN (GLUCOPHAGE) 500 MG tablet Take 500 mg by mouth every morning.    . methocarbamol (ROBAXIN) 750 MG tablet Take 1 tablet (750 mg total) by mouth 2 (two) times daily as needed for muscle spasms. 60 tablet 0  . montelukast (SINGULAIR) 10 MG tablet Take 1 tablet (10 mg total) by mouth at bedtime. 90 tablet 1  .  ondansetron (ZOFRAN) 4 MG tablet Take 1-2 tablets (4-8 mg total) by mouth every 8 (eight) hours as needed for nausea or vomiting. (Patient not taking: Reported on 06/20/2019) 40 tablet 0  . pravastatin (PRAVACHOL) 20 MG tablet Take 1 tablet (20 mg total) by mouth at bedtime. 90 tablet 0  . predniSONE (DELTASONE) 10 MG tablet Take 6, 5, 4, 3, 2, 1 tablets po each day dose in am with food. (Patient not taking: Reported on 06/20/2019) 21 tablet 0  . TRUEPLUS LANCETS 28G MISC Check glucose 2 times a day 100 each 3  . Vitamin D, Ergocalciferol, (DRISDOL) 1.25 MG (50000 UT) CAPS capsule Take 1 capsule (50,000 Units total) by mouth every 7 (seven) days. 16 capsule 0   No current facility-administered medications on file prior to visit.    Allergies  Allergen Reactions  . Phenergan [Promethazine Hcl] Itching and Other (See Comments)    burning    Social History   Socioeconomic History  . Marital status: Married    Spouse name: Not on file  . Number of children: Not on file  . Years of education: Not on file  . Highest education level: Not on file  Occupational History  . Not on file  Tobacco Use  . Smoking status: Passive Smoke Exposure - Never Smoker  . Smokeless tobacco: Never Used  . Tobacco comment: passive cig smoking at work  1994- 2012  Substance and Sexual Activity  . Alcohol use: No  . Drug use: No  . Sexual activity: Yes  Other Topics Concern  . Not on file  Social History Narrative  . Not on file   Social Determinants of Health   Financial Resource Strain:   . Difficulty of Paying Living Expenses: Not on file  Food Insecurity:   . Worried About Charity fundraiser in the Last Year: Not on file  . Ran Out of Food in the Last Year: Not on file  Transportation Needs:   . Lack of Transportation (Medical): Not on file  . Lack of Transportation (Non-Medical): Not on file  Physical Activity:   . Days of Exercise per Week: Not on file  . Minutes of Exercise per Session: Not  on file  Stress:   . Feeling of Stress : Not on file  Social Connections:   . Frequency of Communication with Friends and Family: Not on file  . Frequency of Social Gatherings with Friends and Family: Not on file  . Attends Religious Services: Not on file  . Active Member of Clubs or Organizations: Not on file  . Attends Archivist Meetings: Not on file  . Marital Status: Not on file  Intimate Partner Violence:   . Fear of Current or Ex-Partner: Not on file  . Emotionally Abused: Not on file  . Physically Abused: Not on file  . Sexually Abused:  Not on file    Family History  Problem Relation Age of Onset  . Hypertension Mother   . Hypertension Father   . Heart Problems Brother        Heart transplant, pt does not know cause   . Rectal cancer Brother   . Colon cancer Neg Hx   . Esophageal cancer Neg Hx   . Stomach cancer Neg Hx     Past Surgical History:  Procedure Laterality Date  . ANTERIOR CERVICAL DECOMP/DISCECTOMY FUSION  ~ 1995   "titanium"  . APPENDECTOMY  ~ 1977  . BACK SURGERY     x 2  L 4disectomy  . COLONOSCOPY W/ POLYPECTOMY    . INGUINAL HERNIA REPAIR Right ~ 2000  . LUMBAR DISC SURGERY  ~ 2000; ~2006  . NASAL SINUS SURGERY  ~ 2007  . TOTAL KNEE ARTHROPLASTY Left 08/06/2017   Procedure: LEFT TOTAL KNEE ARTHROPLASTY;  Surgeon: Leandrew Koyanagi, MD;  Location: [facility];  Service: Orthopedics;  Laterality: Left;    ROS: Review of Systems Negative except as stated above  PHYSICAL EXAM: BP (!) 149/57   Pulse 89   Temp 97.8 F (36.6 C)   Resp 16   Wt 193 lb 6.4 oz (87.7 kg)   SpO2 95%   BMI 27.75 kg/m   Physical Exam  General appearance - alert, well appearing, and in no distress Mental status - normal mood, behavior, speech, dress, motor activity, and thought processes Neck - supple, no significant adenopathy Nose: Nasal passage on the left is patent with no enlargement of nasal turbinates.  Nasal passage on the right revealed enlarged nasal  turbinates with questionable polyp Chest - clear to auscultation, no wheezes, rales or rhonchi, symmetric air entry Heart - normal rate, regular rhythm, normal S1, S2, no murmurs, rubs, clicks or gallops Extremities - no LE edema  CMP Latest Ref Rng & Units 09/14/2018 04/06/2018 08/07/2017  Glucose 65 - 99 mg/dL 122(H) 129(H) 162(H)  BUN 6 - 24 mg/dL '14 11 9  '$ Creatinine 0.76 - 1.27 mg/dL 1.28(H) 1.41(H) 1.00  Sodium 134 - 144 mmol/L 139 142 136  Potassium 3.5 - 5.2 mmol/L 3.8 4.5 3.5  Chloride 96 - 106 mmol/L 104 109 103  CO2 20 - 29 mmol/L '23 24 25  '$ Calcium 8.7 - 10.2 mg/dL 8.3(L) 8.5(L) 8.3(L)  Total Protein 6.0 - 8.5 g/dL 5.2(L) - -  Total Bilirubin 0.0 - 1.2 mg/dL 0.8 - -  Alkaline Phos 39 - 117 IU/L 30(L) - -  AST 0 - 40 IU/L 15 - -  ALT 0 - 44 IU/L 16 - -   Lipid Panel     Component Value Date/Time   CHOL 167 09/14/2018 1047   TRIG 43 09/14/2018 1047   HDL 63 09/14/2018 1047   CHOLHDL 2.7 09/14/2018 1047   CHOLHDL 2.4 03/05/2016 0824   VLDL 12 03/05/2016 0824   LDLCALC 95 09/14/2018 1047    CBC    Component Value Date/Time   WBC 5.3 10/06/2018 1610   RBC 4.79 10/06/2018 1610   HGB 14.2 10/06/2018 1610   HGB 16.2 08/19/2018 1441   HCT 42.9 10/06/2018 1610   HCT 48.8 08/19/2018 1441   PLT 255.0 10/06/2018 1610   PLT 266 08/19/2018 1441   MCV 89.6 10/06/2018 1610   MCV 88 08/19/2018 1441   MCH 29.2 08/19/2018 1441   MCH 28.0 04/06/2018 0909   MCHC 33.2 10/06/2018 1610   RDW 13.7 10/06/2018 1610   RDW  13.3 08/19/2018 1441   LYMPHSABS 1.6 10/06/2018 1610   LYMPHSABS 2.1 08/19/2018 1441   MONOABS 0.6 10/06/2018 1610   EOSABS 0.4 10/06/2018 1610   EOSABS 0.5 (H) 08/19/2018 1441   BASOSABS 0.0 10/06/2018 1610   BASOSABS 0.1 08/19/2018 1441   Results for orders placed or performed in visit on 06/20/19  POCT glucose (manual entry)  Result Value Ref Range   POC Glucose 144 (A) 70 - 99 mg/dl  POCT glycosylated hemoglobin (Hb A1C)  Result Value Ref Range    Hemoglobin A1C     HbA1c POC (<> result, manual entry)     HbA1c, POC (prediabetic range) 6.3 5.7 - 6.4 %   HbA1c, POC (controlled diabetic range)      ASSESSMENT AND PLAN: 1. Type 2 diabetes mellitus without complication, without long-term current use of insulin (HCC) At goal.  Continue Metformin.  Discussed healthy eating habits.  Encourage regular exercise as tolerated - POCT glucose (manual entry) - POCT glycosylated hemoglobin (Hb A1C) - Microalbumin / creatinine urine ratio - Ambulatory referral to Ophthalmology  2. Essential hypertension Not at goal.  Patient initially told me that he had already taken amlodipine and lisinopril/HCTZ already for today.  However he later said that he had not taken them as yet for the morning.  Advised him to take them every day and to check his blood pressure at least twice a week with goal being 130/80 or lower.  3. OSA on CPAP Advised to use the Flonase nasal spray at nights and take Claritin to help decrease nasal congestion so that he can use his CPAP  4. Eosinophilic asthma Followed by pulmonary.  They are trying to get him approved for Nucala  5. Chronic allergic rhinitis 6. Nasal polyps See #3 above.     Patient was given the opportunity to ask questions.  Patient verbalized understanding of the plan and was able to repeat key elements of the plan.   Orders Placed This Encounter  Procedures  . Microalbumin / creatinine urine ratio  . POCT glucose (manual entry)  . POCT glycosylated hemoglobin (Hb A1C)     Requested Prescriptions    No prescriptions requested or ordered in this encounter    No follow-ups on file.  Karle Plumber, MD, FACP

## 2019-06-21 DIAGNOSIS — J339 Nasal polyp, unspecified: Secondary | ICD-10-CM | POA: Insufficient documentation

## 2019-06-21 LAB — MICROALBUMIN / CREATININE URINE RATIO
Creatinine, Urine: 165.6 mg/dL
Microalb/Creat Ratio: <2 mg/g creat (ref 0–29)
Microalbumin, Urine: <3 ug/mL

## 2019-06-30 DIAGNOSIS — G4733 Obstructive sleep apnea (adult) (pediatric): Secondary | ICD-10-CM | POA: Diagnosis not present

## 2019-07-04 ENCOUNTER — Telehealth: Payer: Self-pay | Admitting: Pulmonary Disease

## 2019-07-04 NOTE — Telephone Encounter (Signed)
Called and spoke with pt who is requesting information about the nucala injection. Ria Comment, please advise on this for pt. Thanks!

## 2019-07-04 NOTE — Telephone Encounter (Signed)
LMTCB x1 for pt.  

## 2019-07-06 ENCOUNTER — Telehealth: Payer: Self-pay | Admitting: Pulmonary Disease

## 2019-07-06 NOTE — Telephone Encounter (Signed)
LMTCB x2 for pt 

## 2019-07-06 NOTE — Telephone Encounter (Signed)
Spoke with pt. He had several questions about Nucala. We had started this process for him back in December but his copay was going to be too high for him. Pt is now ready to try to get assistance with the cost. I have given him the number to [facility] [place], he will have to contact them to start this process. Nothing further was needed.

## 2019-07-07 ENCOUNTER — Telehealth: Payer: Self-pay | Admitting: Internal Medicine

## 2019-07-07 DIAGNOSIS — J8283 Eosinophilic asthma: Secondary | ICD-10-CM

## 2019-07-07 MED ORDER — PREDNISONE 10 MG PO TABS: ORAL_TABLET | ORAL | 0 refills | Status: DC

## 2019-07-07 NOTE — Telephone Encounter (Signed)
Patient called and requested for prednisone. Patient stated that he is having a flare up and he was having trouble breathing. Please follow up at your earliest convenience. If able to prescribed he would like it at the [facility] [employer].

## 2019-07-07 NOTE — Telephone Encounter (Signed)
Will forward to pcp

## 2019-07-07 NOTE — Telephone Encounter (Addendum)
Checked Dr. Judson Roch in box up front. This document has not been received.

## 2019-07-08 MED ORDER — NUCALA 100 MG ~~LOC~~ SOLR: 100.0000 mg | SUBCUTANEOUS | 12 refills | Status: DC

## 2019-07-08 NOTE — Telephone Encounter (Signed)
Form has been received and filled out. There is a portion of the form that the pt will need to sign. LMTCB x1 for pt.

## 2019-07-08 NOTE — Telephone Encounter (Signed)
Patient called to check on the prescription for prednisone. Patient was informed of pcp note, verbalized understanding and had no further questions or concerns at this time.

## 2019-07-08 NOTE — Telephone Encounter (Signed)
Called pt unable to reach/ Left voice message about prescription was sent to the pharmacy. Name and phone nr provided to call back if needed.

## 2019-07-08 NOTE — Telephone Encounter (Signed)
PA was started on Cover My Meds with [facility]. PA has been approved through 01/08/2020. Rx for Nucala has been sent to [facility]. Will follow up on shipment.

## 2019-07-11 NOTE — Telephone Encounter (Signed)
LMTCB x2 for pt 

## 2019-07-13 NOTE — Telephone Encounter (Signed)
LMTCB x3 for pt.  

## 2019-07-14 ENCOUNTER — Ambulatory Visit: Payer: Medicare Other | Attending: Internal Medicine

## 2019-07-14 DIAGNOSIS — Z23 Encounter for immunization: Secondary | ICD-10-CM

## 2019-07-14 NOTE — Telephone Encounter (Signed)
We have attempted to contact pt several times with no success or call back from pt. Per office protocol, message will be closed.

## 2019-07-14 NOTE — Progress Notes (Signed)
   Covid-19 Vaccination Clinic  Name:  Christian [name]    MRN: [id number] DOB: 12/28/1960  07/14/2019  [name] was observed post Covid-19 immunization for 30 minutes based on pre-vaccination screening without incident. He was provided with Vaccine Information Sheet and instruction to access the V-Safe system.   [name] was instructed to call 911 with any severe reactions post vaccine: Marland Kitchen Difficulty breathing  . Swelling of face and throat  . A fast heartbeat  . A bad rash all over body  . Dizziness and weakness   Immunizations Administered    Name Date Dose VIS Date Route   Pfizer COVID-19 Vaccine 07/14/2019  2:49 PM 0.3 mL 04/08/2019 Intramuscular   Manufacturer: [employer]   Lot: [id number]   [place]: [id number]

## 2019-07-21 ENCOUNTER — Other Ambulatory Visit: Payer: Self-pay

## 2019-07-21 ENCOUNTER — Ambulatory Visit (INDEPENDENT_AMBULATORY_CARE_PROVIDER_SITE_OTHER): Payer: Medicare Other | Admitting: Pulmonary Disease

## 2019-07-21 ENCOUNTER — Encounter: Payer: Self-pay | Admitting: Pulmonary Disease

## 2019-07-21 DIAGNOSIS — Z79899 Other long term (current) drug therapy: Secondary | ICD-10-CM | POA: Insufficient documentation

## 2019-07-21 DIAGNOSIS — J8283 Eosinophilic asthma: Secondary | ICD-10-CM

## 2019-07-21 DIAGNOSIS — G4733 Obstructive sleep apnea (adult) (pediatric): Secondary | ICD-10-CM | POA: Diagnosis not present

## 2019-07-21 MED ORDER — FLUTICASONE-SALMETEROL 500-50 MCG/DOSE IN AEPB: 1.0000 | INHALATION_SPRAY | Freq: Two times a day (BID) | RESPIRATORY_TRACT | 3 refills | Status: DC

## 2019-07-21 MED ORDER — PREDNISONE 10 MG PO TABS: ORAL_TABLET | ORAL | 0 refills | Status: DC

## 2019-07-21 NOTE — Assessment & Plan Note (Signed)
Plan: Continue CPAP 

## 2019-07-21 NOTE — Progress Notes (Signed)
Virtual Visit via Telephone Note  I connected with [name] on 07/21/19 at 11:00 AM EDT by telephone and verified that I am speaking with the correct person using two identifiers.  Location: Patient: Home Provider: Office [facility] - [id number], [place], [place], [place] [address]   I discussed the limitations, risks, security and privacy concerns of performing an evaluation and management service by telephone and the availability of in person appointments. I also discussed with the patient that there may be a patient responsible charge related to this service. The patient expressed understanding and agreed to proceed.  Patient consented to consult via telephone: Yes People present and their role in pt care: Pt     History of Present Illness:  59 year old male never smoker, passive smoke exposure followed in our office for asthma as well as obstructive sleep apnea  PMH: Hypertension, hyperlipidemia, diabetes, chronic pain syndrome Smoker/ Smoking History: Never smoker, passive smoke exposure Maintenance: Advair 500 Pt of: Dr. Ander Slade  Chief complaint: Wheezing   59 year old male never smoker followed in our office for severe persistent eosinophilic asthma.  Patient was last seen in our office in December/2020.  At that time patient started paperwork to apply for Nucala.  This is been an ongoing application.  Largest barrier for the patient is high co-pay cost.  He is reporting to be around $400.  He has not yet contacted [employer] to express need for financial hardship for waivers or coverage.  We will discuss this today. Patient reports that he is having a cough he is bringing up clear mucus.  Patient is using his rescue inhaler 3 times a day, patient is using his albuterol nebulizer 2 times a day.  Unfortunately the patient ran out of Advair 500 5 days ago.  Although he has refills at the pharmacy he has not yet picked those up yet.  Patient is requesting a  prednisone taper today.  Last prednisone prescriptions for the patient was in January/2021 from primary care.  Patient also received a telephonic prednisone taper in March/2021 by primary care.   Observations/Objective:  04/06/2018-chest x-ray-negative, no cardiopulmonary abnormality  10/06/2018-CBC with differential-eosinophils relative 7.1, eosinophils absolute 0.4 10/06/2018-IgE-273  12/15/2018-home sleep study-AHI 12.1, SaO2 low 83%  05/07/2016-pulmonary function test-FVC 3.11 (82% predicted), postbronchodilator ratio 70, postbronchodilator FEV1 1.73 (58% predicted), no bronchodilator response, DLCO 22.17 (74% predicted)  01/23/2016-echocardiogram-LV ejection fraction 65 to [id number], grade 1 diastolic dysfunction   Social History   Tobacco Use  Smoking Status Passive Smoke Exposure - Never Smoker  Smokeless Tobacco Never Used  Tobacco Comment   passive cig smoking at work  1994- 2012   Immunization History  Administered Date(s) Administered  . Influenza Inj Mdck Quad Pf 05/29/2018  . Influenza Split 02/23/2016  . Influenza,inj,Quad PF,6+ Mos 12/22/2016  . Influenza-Unspecified 02/07/2019  . PFIZER SARS-COV-2 Vaccination 07/14/2019  . Pneumococcal Conjugate-13 03/25/2016  . Pneumococcal Polysaccharide-23 07/16/2016  . Tdap 04/02/2016      Assessment and Plan:  Eosinophilic asthma Patient currently off of Advair 500, despite having refills at the pharmacy Patient has had 2 prednisone tapers within the last 3 months Patient requesting prednisone taper today Patient using Saba rescue inhaler 3 times daily Patient using Saba nebulized meds 2 times daily Patient endorsing increased cough, shortness of breath, wheezing Patient struggling with getting started on Nucala due to high co-pays  Plan: Prednisone taper sent to the pharmacy Refilled Advair 500, emphasized importance for patient to go pick up inhaler  immediately We will coordinate close follow-up with our office with  the clinical pharmacy team to further support the patient on getting started on IL-5/biologic therapy Explained to the patient that he needs to contact [place] and express need for financial hardship, telephone number provided [phone] Emphasized importance of keeping close follow-up with our office and returning telephone calls when we call 1 to 2-week follow-up with pharmacy team as well as myself in office  OSA (obstructive sleep apnea) Plan: Continue CPAP  Medication management Patient needs help with getting started on biologic therapy due to recurrent asthma exacerbations Patient struggling with medication compliance which affects his overall asthma control  Plan: Prednisone taper today Refilled Advair 500, informed patient to go immediately to pick this up from the pharmacy Patient will need 1 to 2-week follow-up with clinical pharmacy team to help with Nucala start Provided telephone number for [place] for the patient to contact to express financial hardship   Follow Up Instructions:  Return in about 2 weeks (around 08/04/2019), or if symptoms worsen or fail to improve, for Follow up with Wyn Quaker FNP-C, clinical pharmacy team appt.   I discussed the assessment and treatment plan with the patient. The patient was provided an opportunity to ask questions and all were answered. The patient agreed with the plan and demonstrated an understanding of the instructions.   The patient was advised to call back or seek an in-person evaluation if the symptoms worsen or if the condition fails to improve as anticipated.  I provided 32 minutes of non-face-to-face time during this encounter.   Lauraine Rinne, NP

## 2019-07-21 NOTE — Assessment & Plan Note (Signed)
Patient currently off of Advair 500, despite having refills at the pharmacy Patient has had 2 prednisone tapers within the last 3 months Patient requesting prednisone taper today Patient using Saba rescue inhaler 3 times daily Patient using Saba nebulized meds 2 times daily Patient endorsing increased cough, shortness of breath, wheezing Patient struggling with getting started on Nucala due to high co-pays  Plan: Prednisone taper sent to the pharmacy Refilled Advair 500, emphasized importance for patient to go pick up inhaler immediately We will coordinate close follow-up with our office with the clinical pharmacy team to further support the patient on getting started on IL-5/biologic therapy Explained to the patient that he needs to contact [place] and express need for financial hardship, telephone number provided [phone] Emphasized importance of keeping close follow-up with our office and returning telephone calls when we call 1 to 2-week follow-up with pharmacy team as well as myself in office

## 2019-07-21 NOTE — Assessment & Plan Note (Signed)
Patient needs help with getting started on biologic therapy due to recurrent asthma exacerbations Patient struggling with medication compliance which affects his overall asthma control  Plan: Prednisone taper today Refilled Advair 500, informed patient to go immediately to pick this up from the pharmacy Patient will need 1 to 2-week follow-up with clinical pharmacy team to help with Nucala start Provided telephone number for [place] for the patient to contact to express financial hardship

## 2019-07-21 NOTE — Patient Instructions (Addendum)
You were seen today by Lauraine Rinne, NP  for:   Thank you for completing the telephone visit with our office today.  I am very concerned about your breathing.  Please present to your pharmacy immediately to pick up your Advair inhaler.  Please resume this immediately.  I have sent in another prednisone taper.  He will need to be seen in our office in 1 to 2 weeks with the pharmacy team as well as with myself for an office visit.  Please contact [facility] as discussed.  Take care and stay safe,  Christian [name]  1. Eosinophilic asthma  - Fluticasone-Salmeterol (ADVAIR DISKUS) 500-50 MCG/DOSE AEPB; Inhale 1 puff into the lungs 2 (two) times daily.  Dispense: 60 each; Refill: 3 - predniSONE (DELTASONE) 10 MG tablet; 4 tabs for 2 days, then 3 tabs for 2 days, 2 tabs for 2 days, then 1 tab for 2 days, then stop  Dispense: 20 tablet; Refill: 0  2. OSA (obstructive sleep apnea)  Continue CPAP  3. Medication management  Please present to our office in 1-2 weeks for an appointment with the clinical pharmacy team for:  Marland Kitchen Medication Management  . Medication reconciliation  . Medication Access - [place]ala  . Inhaler teaching     We recommend today:   Meds ordered this encounter  Medications  . Fluticasone-Salmeterol (ADVAIR DISKUS) 500-50 MCG/DOSE AEPB    Sig: Inhale 1 puff into the lungs 2 (two) times daily.    Dispense:  60 each    Refill:  3  . predniSONE (DELTASONE) 10 MG tablet    Sig: 4 tabs for 2 days, then 3 tabs for 2 days, 2 tabs for 2 days, then 1 tab for 2 days, then stop    Dispense:  20 tablet    Refill:  0    Follow Up:    Return in about 2 weeks (around 08/04/2019), or if symptoms worsen or fail to improve, for Follow up with Christian [name], clinical pharmacy team appt.  Please present to our office in 1-2 weeks for an appointment with the clinical pharmacy team for:  Marland Kitchen Medication Management  . Medication reconciliation  . Medication Access - Nucala  . Inhaler  teaching     Please do your part to reduce the spread of COVID-19:      Reduce your risk of any infection  and COVID19 by using the similar precautions used for avoiding the common cold or flu:  Marland Kitchen Wash your hands often with soap and warm water for at least 20 seconds.  If soap and water are not readily available, use an alcohol-based hand sanitizer with at least 60% alcohol.  . If coughing or sneezing, cover your mouth and nose by coughing or sneezing into the elbow areas of your shirt or coat, into a tissue or into your sleeve (not your hands). [name] A MASK when in public  . Avoid shaking hands with others and consider head nods or verbal greetings only. . Avoid touching your eyes, nose, or mouth with unwashed hands.  . Avoid close contact with people who are sick. . Avoid places or events with large numbers of people in one location, like concerts or sporting events. . If you have some symptoms but not all symptoms, continue to monitor at home and seek medical attention if your symptoms worsen. . If you are having a medical emergency, call 911.   ADDITIONAL HEALTHCARE OPTIONS FOR PATIENTS  [facility] / e-Visit:  [email or web address]         [facility]: [phone]  [facility]: [id number]                   [facility]: [id number]     It is flu season:   >>> Best ways to protect herself from the flu: Receive the yearly flu vaccine, practice good hand hygiene washing with soap and also using hand sanitizer when available, eat a nutritious meals, get adequate rest, hydrate appropriately   Please contact the office if your symptoms worsen or you have concerns that you are not improving.   Thank you for choosing [facility] for your healthcare, and for allowing [place] to partner with you on your healthcare journey. I am thankful to be able to provide care to you today.    Christian [name]

## 2019-07-31 DIAGNOSIS — G4733 Obstructive sleep apnea (adult) (pediatric): Secondary | ICD-10-CM | POA: Diagnosis not present

## 2019-08-02 ENCOUNTER — Other Ambulatory Visit: Payer: Self-pay | Admitting: Internal Medicine

## 2019-08-02 DIAGNOSIS — I10 Essential (primary) hypertension: Secondary | ICD-10-CM

## 2019-08-08 ENCOUNTER — Ambulatory Visit: Payer: Medicare Other

## 2019-08-10 ENCOUNTER — Ambulatory Visit: Payer: Medicare Other | Attending: Internal Medicine

## 2019-08-10 DIAGNOSIS — Z23 Encounter for immunization: Secondary | ICD-10-CM

## 2019-08-10 NOTE — Progress Notes (Signed)
   Covid-19 Vaccination Clinic  Name:  Christian [name]    MRN: [id number] DOB: 1960-08-24  08/10/2019  Christian [name] was observed post Covid-19 immunization for 15 minutes without incident. He was provided with Vaccine Information Sheet and instruction to access the V-Safe system.   Christian [name] was instructed to call 911 with any severe reactions post vaccine: Marland Kitchen Difficulty breathing  . Swelling of face and throat  . A fast heartbeat  . A bad rash all over body  . Dizziness and weakness   Immunizations Administered    Name Date Dose VIS Date Route   Pfizer COVID-19 Vaccine 08/10/2019 10:08 AM 0.3 mL 04/08/2019 Intramuscular   Manufacturer: [place]   Lot: [id number]   [place]: [id number]

## 2019-08-17 ENCOUNTER — Ambulatory Visit (INDEPENDENT_AMBULATORY_CARE_PROVIDER_SITE_OTHER): Payer: Medicare Other | Admitting: Otolaryngology

## 2019-08-26 ENCOUNTER — Other Ambulatory Visit: Payer: Self-pay | Admitting: Internal Medicine

## 2019-08-26 DIAGNOSIS — E1140 Type 2 diabetes mellitus with diabetic neuropathy, unspecified: Secondary | ICD-10-CM

## 2019-08-30 DIAGNOSIS — G4733 Obstructive sleep apnea (adult) (pediatric): Secondary | ICD-10-CM | POA: Diagnosis not present

## 2019-09-06 ENCOUNTER — Encounter: Payer: Self-pay | Admitting: Orthopaedic Surgery

## 2019-09-06 ENCOUNTER — Ambulatory Visit (INDEPENDENT_AMBULATORY_CARE_PROVIDER_SITE_OTHER): Payer: Medicare Other | Admitting: Orthopaedic Surgery

## 2019-09-06 ENCOUNTER — Ambulatory Visit: Payer: Self-pay

## 2019-09-06 ENCOUNTER — Telehealth: Payer: Self-pay | Admitting: Internal Medicine

## 2019-09-06 ENCOUNTER — Other Ambulatory Visit: Payer: Self-pay

## 2019-09-06 DIAGNOSIS — Z96652 Presence of left artificial knee joint: Secondary | ICD-10-CM | POA: Diagnosis not present

## 2019-09-06 NOTE — Progress Notes (Signed)
Office Visit Note   Patient: Christian [name]           Date of Birth: 07-Oct-1960           MRN: [id number] Visit Date: 09/06/2019              Requested by: Ladell Pier, MD [address] [place],  [place] [address] PCP: Ladell Pier, MD   Assessment & Plan: Visit Diagnoses:  1. Presence of left artificial knee joint     Plan: From a knee replacement standpoint he is doing well.  I he is released to activity as tolerated.  I do think that he would benefit from compression socks because he has significant pitting edema.  I recommend that he see his PCP to look into possibly treating his pitting edema because I feel that this is fairly symptomatic.  From my standpoint I can see him back in another year with two-view x-rays of the left knee.  Follow-Up Instructions: Return in about 1 year (around 09/05/2020).   Orders:  Orders Placed This Encounter  Procedures  . XR KNEE 3 VIEW LEFT   No orders of the defined types were placed in this encounter.     Procedures: No procedures performed   Clinical Data: No additional findings.   Subjective: Chief Complaint  Patient presents with  . Left Knee - Follow-up    [name] is 2 years status post left total knee replacement.  He does not have any pain but mainly has some swelling that is worse at the end of the day.   Review of Systems  Constitutional: Negative.   All other systems reviewed and are negative.    Objective: Vital Signs: There were no vitals taken for this visit.  Physical Exam Vitals and nursing note reviewed.  Constitutional:      Appearance: He is well-developed.  Pulmonary:     Effort: Pulmonary effort is normal.  Abdominal:     Palpations: Abdomen is soft.  Skin:    General: Skin is warm.  Neurological:     Mental Status: He is alert and oriented to person, place, and time.  Psychiatric:        Behavior: Behavior normal.        Thought Content: Thought content normal.      Judgment: Judgment normal.     Ortho Exam Left knee shows a fully healed surgical scar.  He has excellent range of motion.  He does have 2+ pitting edema in both legs. Specialty Comments:  No specialty comments available.  Imaging: XR KNEE 3 VIEW LEFT  Result Date: 09/06/2019 Stable total knee replacement without complication    PMFS History: Patient Active Problem List   Diagnosis Date Noted  . Medication management 07/21/2019  . Nasal polyps 06/21/2019  . Allergic rhinitis 04/05/2019  . OSA (obstructive sleep apnea) 04/05/2019  . Presence of left artificial knee joint 08/24/2017  . Eosinophilic asthma [id number]  . Primary osteoarthritis of left knee 02/23/2017  . Chronic pain syndrome 04/02/2016  . Intrinsic asthma 03/25/2016  . Essential hypertension 01/23/2016  . Hyperlipidemia 01/23/2016  . Secondhand smoke exposure 01/23/2016  . Diabetes mellitus with neuropathy ([place]) 01/23/2016   Past Medical History:  Diagnosis Date  . Allergy   . Arthritis    "left knee" (01/22/2016)  . Asthma   . Chronic lower back pain   . COPD (chronic obstructive pulmonary disease) ([place])   . Dyspnea    with extertion  .  High cholesterol   . Hypertension   . Neuropathy   . OSA on CPAP   . Secondhand smoke exposure   . Sleep apnea    wears CPAP, not wearing now  for months  . Type II diabetes mellitus ([place]) dx'd 01/17/2016    Family History  Problem Relation Age of Onset  . Hypertension Mother   . Hypertension Father   . Heart Problems Brother        Heart transplant, pt does not know cause   . Rectal cancer Brother   . Colon cancer Neg Hx   . Esophageal cancer Neg Hx   . Stomach cancer Neg Hx     Past Surgical History:  Procedure Laterality Date  . ANTERIOR CERVICAL DECOMP/DISCECTOMY FUSION  ~ 1995   "titanium"  . APPENDECTOMY  ~ 1977  . BACK SURGERY     x 2  L 4disectomy  . COLONOSCOPY W/ POLYPECTOMY    . INGUINAL HERNIA REPAIR Right ~ 2000  . LUMBAR DISC SURGERY  ~  2000; ~2006  . NASAL SINUS SURGERY  ~ 2007  . TOTAL KNEE ARTHROPLASTY Left 08/06/2017   Procedure: LEFT TOTAL KNEE ARTHROPLASTY;  Surgeon: Leandrew Koyanagi, MD;  Location: [facility];  Service: Orthopedics;  Laterality: Left;   Social History   Occupational History  . Not on file  Tobacco Use  . Smoking status: Passive Smoke Exposure - Never Smoker  . Smokeless tobacco: Never Used  . Tobacco comment: passive cig smoking at work  1994- 2012  Substance and Sexual Activity  . Alcohol use: No  . Drug use: No  . Sexual activity: Yes

## 2019-09-06 NOTE — Telephone Encounter (Signed)
Patient called saying his leg is swollen and would like to know if he can increase on his fluid pill. Patient is requesting to speak with his nurse. Please f/u

## 2019-09-06 NOTE — Telephone Encounter (Signed)
Will forward to pcp

## 2019-09-07 NOTE — Telephone Encounter (Signed)
Pt already has an appointment schedule for 5/13 with walk-in provider

## 2019-09-08 ENCOUNTER — Ambulatory Visit: Payer: Medicare Other | Attending: Internal Medicine | Admitting: Physician Assistant

## 2019-09-08 ENCOUNTER — Other Ambulatory Visit: Payer: Self-pay

## 2019-09-08 VITALS — BP 117/84 | HR 79 | Temp 97.7°F | Ht 70.0 in | Wt 192.0 lb

## 2019-09-08 DIAGNOSIS — R609 Edema, unspecified: Secondary | ICD-10-CM

## 2019-09-08 DIAGNOSIS — J329 Chronic sinusitis, unspecified: Secondary | ICD-10-CM

## 2019-09-08 DIAGNOSIS — E119 Type 2 diabetes mellitus without complications: Secondary | ICD-10-CM | POA: Diagnosis not present

## 2019-09-08 LAB — GLUCOSE, POCT (MANUAL RESULT ENTRY): POC Glucose: 109 mg/dl — AB (ref 70–99)

## 2019-09-08 MED ORDER — FUROSEMIDE 20 MG PO TABS: 20.0000 mg | ORAL_TABLET | Freq: Every day | ORAL | 0 refills | Status: DC

## 2019-09-08 MED ORDER — AMOXICILLIN 500 MG PO CAPS: 500.0000 mg | ORAL_CAPSULE | Freq: Three times a day (TID) | ORAL | 0 refills | Status: DC

## 2019-09-08 NOTE — Progress Notes (Signed)
Patient ID: Christian [name], male   DOB: [birth year], 59 y.o.   MRN: [id number]   Christian [name], is a 59 y.o. male  [id number]  [id number]  DOB - 1961-03-07  Subjective:  Chief Complaint and HPI: Christian [name] is a 59 y.o. male here today to for leg swelling for the past couple of weeks.  Not wearing [place] stockings as recommended.  Denies new SOB or CP.   No pain.    Also c/o 2 week h/o blowing yellow discharge from his nose.  +sinus pressure.  No fever.  No cough.     ROS:   Constitutional:  No f/c, No night sweats, No unexplained weight loss. EENT:  No vision changes, No blurry vision, No hearing changes. No mouth, throat, or ear problems.  Respiratory: No cough, No SOB Cardiac: No CP, no palpitations GI:  No abd pain, No N/V/D. GU: No Urinary s/sx Musculoskeletal: No joint pain Neuro: No headache, no dizziness, no motor weakness.  Skin: No rash Endocrine:  No polydipsia. No polyuria.  Psych: Denies SI/HI  No problems updated.  ALLERGIES: Allergies  Allergen Reactions  . Phenergan [Promethazine Hcl] Itching and Other (See Comments)    burning    PAST MEDICAL HISTORY: Past Medical History:  Diagnosis Date  . Allergy   . Arthritis    "left knee" (01/22/2016)  . Asthma   . Chronic lower back pain   . COPD (chronic obstructive pulmonary disease) ([place])   . Dyspnea    with extertion  . High cholesterol   . Hypertension   . Neuropathy   . OSA on CPAP   . Secondhand smoke exposure   . Sleep apnea    wears CPAP, not wearing now  for months  . Type II diabetes mellitus ([place]) dx'd 01/17/2016    MEDICATIONS AT HOME: Prior to Admission medications   Medication Sig Start Date End Date Taking? Authorizing Provider  ACCU-CHEK GUIDE test strip USE 1  TEST STRIP ONCE DAILY TO CHECK BLOOD SUGAR 08/26/19  Yes Ladell Pier, MD  albuterol (VENTOLIN HFA) 108 (90 Base) MCG/ACT inhaler INHALE 1 TO 2 PUFFS BY MOUTH EVERY 6 HOURS AS NEEDED FOR WHEEZING FOR SHORTNESS OF BREATH  03/11/19  Yes Ladell Pier, MD  amLODipine (NORVASC) 10 MG tablet Take 1 tablet by mouth once daily 08/03/19  Yes Ladell Pier, MD  aspirin EC 325 MG tablet Take 1 tablet (325 mg total) by mouth 2 (two) times daily. Patient taking differently: Take 325 mg by mouth once.  08/06/17  Yes Leandrew Koyanagi, MD  Blood Glucose Monitoring Suppl (ONE TOUCH ULTRA 2) w/Device KIT Use as directed 3 times daily E11.9 03/31/17  Yes Ladell Pier, MD  cetirizine (ZYRTEC) 10 MG tablet Take 1 tablet (10 mg total) by mouth daily. 11/17/18  Yes Argentina Donovan, PA-C  diclofenac sodium (VOLTAREN) 1 % GEL Apply 2 g 4 (four) times daily topically. Patient taking differently: Apply 2 g topically 4 (four) times daily as needed (for pain.).  03/03/17  Yes Leandrew Koyanagi, MD  fluticasone (FLONASE) 50 MCG/ACT nasal spray Place 2 sprays into both nostrils daily. 01/19/19  Yes Oleda Borski M, PA-C  Fluticasone-Salmeterol (ADVAIR DISKUS) 500-50 MCG/DOSE AEPB Inhale 1 puff into the lungs 2 (two) times daily. 07/21/19  Yes Lauraine Rinne, NP  gabapentin (NEURONTIN) 300 MG capsule TAKE 1 CAPSULE BY MOUTH THREE TIMES DAILY 02/01/19  Yes Ladell Pier, MD  ipratropium-albuterol (DUONEB) 0.5-2.5 (3) MG/3ML SOLN  Take 3 mLs by nebulization every 4 (four) hours as needed. 11/17/18  Yes Freeman Caldron M, PA-C  lisinopril-hydrochlorothiazide (ZESTORETIC) 10-12.5 MG tablet Take 1 tablet by mouth once daily 08/03/19  Yes Ladell Pier, MD  Mepolizumab (NUCALA) 100 MG SOLR Inject 100 mg into the skin every 28 (twenty-eight) days. 07/08/19  Yes Olalere, Adewale A, MD  metFORMIN (GLUCOPHAGE) 500 MG tablet Take 500 mg by mouth every morning. 03/22/19  Yes [provider]  methocarbamol (ROBAXIN) 750 MG tablet Take 1 tablet (750 mg total) by mouth 2 (two) times daily as needed for muscle spasms. 08/06/17  Yes Leandrew Koyanagi, MD  montelukast (SINGULAIR) 10 MG tablet Take 1 tablet (10 mg total) by mouth at bedtime. 03/02/19  Yes  Charlott Rakes, MD  pravastatin (PRAVACHOL) 20 MG tablet Take 1 tablet (20 mg total) by mouth at bedtime. 11/17/18  Yes Shamikia Linskey M, PA-C  predniSONE (DELTASONE) 10 MG tablet 4 tabs for 2 days, then 3 tabs for 2 days, 2 tabs for 2 days, then 1 tab for 2 days, then stop 07/21/19  Yes Lauraine Rinne, NP  TRUEPLUS LANCETS 28G MISC Check glucose 2 times a day 02/01/16  Yes Langeland, Dawn T, MD  Vitamin D, Ergocalciferol, (DRISDOL) 1.25 MG (50000 UT) CAPS capsule Take 1 capsule (50,000 Units total) by mouth every 7 (seven) days. 08/23/18  Yes Freeman Caldron M, PA-C  amoxicillin (AMOXIL) 500 MG capsule Take 1 capsule (500 mg total) by mouth 3 (three) times daily. 09/08/19   Argentina Donovan, PA-C  furosemide (LASIX) 20 MG tablet Take 1 tablet (20 mg total) by mouth daily. X 1 week then prn swelling 09/08/19   Freeman Caldron M, PA-C  ondansetron (ZOFRAN) 4 MG tablet Take 1-2 tablets (4-8 mg total) by mouth every 8 (eight) hours as needed for nausea or vomiting. Patient not taking: Reported on 06/20/2019 08/06/17   Leandrew Koyanagi, MD     Objective:  EXAM:   Vitals:   09/08/19 1441  BP: 117/84  Pulse: 79  Temp: 97.7 F (36.5 C)  TempSrc: Temporal  SpO2: 93%  Weight: 192 lb (87.1 kg)  Height: _0  (1.778 m)    General appearance : A&OX3. NAD. Non-toxic-appearing HEENT: Atraumatic and Normocephalic.  PERRLA. EOM intact.   No JVD Chest/Lungs:  Breathing-non-labored, Good air entry bilaterally, breath sounds normal without rales, rhonchi, or wheezing  CVS: S1 S2 regular, no murmurs, gallops, rubs . Extremities: Bilateral Lower Ext shows 1-2+ edema B with R.L.  Neg Homan's, no calf erythema both legs are warm to touch with = pulse throughout Neurology:  CN II-XII grossly intact, Non focal.   Psych:  TP linear. J/I WNL. Normal speech. Appropriate eye contact and affect.  Skin:  No Rash  Data Review Lab Results  Component Value Date   HGBA1C 6.3 06/20/2019   HGBA1C 6.3 08/19/2018    HGBA1C 5.9 (H) 08/04/2017     Assessment & Plan   1. Type 2 diabetes mellitus without complication, without long-term current use of insulin (HCC) Stable-continue current medications - Glucose (CBG) - Comprehensive metabolic panel  2. Edema, unspecified type - furosemide (LASIX) 20 MG tablet; Take 1 tablet (20 mg total) by mouth daily. X 1 week then prn swelling  Dispense: 30 tablet; Refill: 0 - Brain natriuretic peptide _ted stockings, limit salt/sugar  3. Sinusitis, unspecified chronicity, unspecified location - amoxicillin (AMOXIL) 500 MG capsule; Take 1 capsule (500 mg total) by mouth 3 (three) times daily.  Dispense: 30 capsule; Refill: 0   Patient have been counseled extensively about nutrition and exercise  Return for keep f/up appts as scheduled.  The patient was given clear instructions to go to ER or return to medical center if symptoms don't improve, worsen or new problems develop. The patient verbalized understanding. The patient was told to call to get lab results if they haven't heard anything in the next week.     Freeman Caldron, PA-C [facility] and [facility], [place]   09/08/2019, 3:14 PM

## 2019-09-08 NOTE — Patient Instructions (Signed)
Edema  Edema is when you have too much fluid in your body or under your skin. Edema may make your legs, feet, and ankles swell up. Swelling is also common in looser tissues, like around your eyes. This is a common condition. It gets more common as you get older. There are many possible causes of edema. Eating too much salt (sodium) and being on your feet or sitting for a long time can cause edema in your legs, feet, and ankles. Hot weather may make edema worse. Edema is usually painless. Your skin may look swollen or shiny. Follow these instructions at home:  Keep the swollen body part raised (elevated) above the level of your heart when you are sitting or lying down.  Do not sit still or stand for a long time.  Do not wear tight clothes. Do not wear garters on your upper legs.  Exercise your legs. This can help the swelling go down.  Wear elastic bandages or support stockings as told by your doctor.  Eat a low-salt (low-sodium) diet to reduce fluid as told by your doctor.  Depending on the cause of your swelling, you may need to limit how much fluid you drink (fluid restriction).  Take over-the-counter and prescription medicines only as told by your doctor. Contact a doctor if:  Treatment is not working.  You have heart, liver, or kidney disease and have symptoms of edema.  You have sudden and unexplained weight gain. Get help right away if:  You have shortness of breath or chest pain.  You cannot breathe when you lie down.  You have pain, redness, or warmth in the swollen areas.  You have heart, liver, or kidney disease and get edema all of a sudden.  You have a fever and your symptoms get worse all of a sudden. Summary  Edema is when you have too much fluid in your body or under your skin.  Edema may make your legs, feet, and ankles swell up. Swelling is also common in looser tissues, like around your eyes.  Raise (elevate) the swollen body part above the level of your  heart when you are sitting or lying down.  Follow your doctor's instructions about diet and how much fluid you can drink (fluid restriction). This information is not intended to replace advice given to you by your health care provider. Make sure you discuss any questions you have with your health care provider. Document Revised: 04/17/2017 Document Reviewed: 05/02/2016 Elsevier Patient Education  2020 [employer]  

## 2019-09-09 LAB — BRAIN NATRIURETIC PEPTIDE: BNP: <2.5 pg/mL (ref 0.0–100.0)

## 2019-09-09 LAB — COMPREHENSIVE METABOLIC PANEL
ALT: 15 IU/L (ref 0–44)
AST: 16 IU/L (ref 0–40)
Albumin/Globulin Ratio: 1.9 (ref 1.2–2.2)
Albumin: 3.8 g/dL (ref 3.8–4.9)
Alkaline Phosphatase: 35 IU/L — ABNORMAL LOW (ref 39–117)
BUN/Creatinine Ratio: 9 (ref 9–20)
BUN: 11 mg/dL (ref 6–24)
Bilirubin Total: 0.4 mg/dL (ref 0.0–1.2)
CO2: 22 mmol/L (ref 20–29)
Calcium: 8.5 mg/dL — ABNORMAL LOW (ref 8.7–10.2)
Chloride: 105 mmol/L (ref 96–106)
Creatinine, Ser: 1.25 mg/dL (ref 0.76–1.27)
GFR calc Af Amer: 72 mL/min/{1.73_m2} (ref >59)
GFR calc non Af Amer: 63 mL/min/{1.73_m2} (ref >59)
Globulin, Total: 2 g/dL (ref 1.5–4.5)
Glucose: 97 mg/dL (ref 65–99)
Potassium: 4.4 mmol/L (ref 3.5–5.2)
Sodium: 140 mmol/L (ref 134–144)
Total Protein: 5.8 g/dL — ABNORMAL LOW (ref 6.0–8.5)

## 2019-09-20 ENCOUNTER — Ambulatory Visit: Payer: Medicare Other | Admitting: Pharmacist

## 2019-09-29 ENCOUNTER — Emergency Department (HOSPITAL_COMMUNITY): Payer: Medicare Other

## 2019-09-29 ENCOUNTER — Emergency Department (HOSPITAL_COMMUNITY)
Admission: EM | Admit: 2019-09-29 | Discharge: 2019-09-30 | Disposition: A | Discharge status: Home / Self Care | Payer: Medicare Other | Attending: Emergency Medicine | Admitting: Emergency Medicine

## 2019-09-29 ENCOUNTER — Encounter (HOSPITAL_COMMUNITY): Payer: Self-pay

## 2019-09-29 DIAGNOSIS — Z7984 Long term (current) use of oral hypoglycemic drugs: Secondary | ICD-10-CM | POA: Diagnosis not present

## 2019-09-29 DIAGNOSIS — I10 Essential (primary) hypertension: Secondary | ICD-10-CM | POA: Insufficient documentation

## 2019-09-29 DIAGNOSIS — Z7722 Contact with and (suspected) exposure to environmental tobacco smoke (acute) (chronic): Secondary | ICD-10-CM | POA: Diagnosis not present

## 2019-09-29 DIAGNOSIS — J441 Chronic obstructive pulmonary disease with (acute) exacerbation: Secondary | ICD-10-CM | POA: Insufficient documentation

## 2019-09-29 DIAGNOSIS — R079 Chest pain, unspecified: Secondary | ICD-10-CM | POA: Insufficient documentation

## 2019-09-29 DIAGNOSIS — Z20822 Contact with and (suspected) exposure to covid-19: Secondary | ICD-10-CM | POA: Insufficient documentation

## 2019-09-29 DIAGNOSIS — Z79899 Other long term (current) drug therapy: Secondary | ICD-10-CM | POA: Insufficient documentation

## 2019-09-29 DIAGNOSIS — R0981 Nasal congestion: Secondary | ICD-10-CM | POA: Insufficient documentation

## 2019-09-29 DIAGNOSIS — R05 Cough: Secondary | ICD-10-CM | POA: Diagnosis present

## 2019-09-29 DIAGNOSIS — E1140 Type 2 diabetes mellitus with diabetic neuropathy, unspecified: Secondary | ICD-10-CM | POA: Insufficient documentation

## 2019-09-29 DIAGNOSIS — R0602 Shortness of breath: Secondary | ICD-10-CM | POA: Insufficient documentation

## 2019-09-29 DIAGNOSIS — Z96652 Presence of left artificial knee joint: Secondary | ICD-10-CM | POA: Insufficient documentation

## 2019-09-29 LAB — CBC
HCT: 46.1 % (ref 39.0–52.0)
Hemoglobin: 15.3 g/dL (ref 13.0–17.0)
MCH: 29.8 pg (ref 26.0–34.0)
MCHC: 33.2 g/dL (ref 30.0–36.0)
MCV: 89.7 fL (ref 80.0–100.0)
Platelets: 225 10*3/uL (ref 150–400)
RBC: 5.14 MIL/uL (ref 4.22–5.81)
RDW: 13.7 % (ref 11.5–15.5)
WBC: 4 10*3/uL (ref 4.0–10.5)
nRBC: 0 % (ref 0.0–0.2)

## 2019-09-29 LAB — BASIC METABOLIC PANEL
Anion gap: 8 (ref 5–15)
BUN: 6 mg/dL (ref 6–20)
CO2: 24 mmol/L (ref 22–32)
Calcium: 8.2 mg/dL — ABNORMAL LOW (ref 8.9–10.3)
Chloride: 109 mmol/L (ref 98–111)
Creatinine, Ser: 1.36 mg/dL — ABNORMAL HIGH (ref 0.61–1.24)
GFR calc Af Amer: >60 mL/min (ref >60)
GFR calc non Af Amer: 57 mL/min — ABNORMAL LOW (ref >60)
Glucose, Bld: 123 mg/dL — ABNORMAL HIGH (ref 70–99)
Potassium: 3.9 mmol/L (ref 3.5–5.1)
Sodium: 141 mmol/L (ref 135–145)

## 2019-09-29 LAB — TROPONIN I (HIGH SENSITIVITY)
Troponin I (High Sensitivity): 2 ng/L (ref <18)
Troponin I (High Sensitivity): 4 ng/L (ref <18)

## 2019-09-29 LAB — SARS CORONAVIRUS 2 BY RT PCR (HOSPITAL ORDER, PERFORMED IN ~~LOC~~ HOSPITAL LAB): SARS Coronavirus 2: NEGATIVE

## 2019-09-29 MED ORDER — IPRATROPIUM BROMIDE HFA 17 MCG/ACT IN AERS
3.0000 | INHALATION_SPRAY | Freq: Once | RESPIRATORY_TRACT | Status: AC
  Administered 2019-09-29: 3 via RESPIRATORY_TRACT
  Filled 2019-09-29: qty 12.9

## 2019-09-29 MED ORDER — MAGNESIUM SULFATE 2 GM/50ML IV SOLN
2.0000 g | Freq: Once | INTRAVENOUS | Status: AC
  Administered 2019-09-29: 2 g via INTRAVENOUS
  Filled 2019-09-29: qty 50

## 2019-09-29 MED ORDER — ALBUTEROL SULFATE HFA 108 (90 BASE) MCG/ACT IN AERS
8.0000 | INHALATION_SPRAY | Freq: Once | RESPIRATORY_TRACT | Status: AC
  Administered 2019-09-29: 8 via RESPIRATORY_TRACT
  Filled 2019-09-29: qty 6.7

## 2019-09-29 MED ORDER — ACETAMINOPHEN 500 MG PO TABS
1000.0000 mg | ORAL_TABLET | Freq: Once | ORAL | Status: AC
  Administered 2019-09-29: 1000 mg via ORAL
  Filled 2019-09-29: qty 2

## 2019-09-29 MED ORDER — SODIUM CHLORIDE 0.9% FLUSH
3.0000 mL | Freq: Once | INTRAVENOUS | Status: AC
  Administered 2019-09-29: 3 mL via INTRAVENOUS

## 2019-09-29 MED ORDER — PREDNISONE 10 MG (21) PO TBPK
ORAL_TABLET | Freq: Every day | ORAL | 0 refills | Status: DC
Start: 2019-09-29 — End: 2019-10-18

## 2019-09-29 MED ORDER — METHYLPREDNISOLONE SODIUM SUCC 125 MG IJ SOLR
125.0000 mg | Freq: Once | INTRAMUSCULAR | Status: AC
  Administered 2019-09-29: 125 mg via INTRAVENOUS
  Filled 2019-09-29: qty 2

## 2019-09-29 NOTE — ED Provider Notes (Signed)
[facility] EMERGENCY DEPARTMENT Provider Note   CSN: [id number] Arrival date & time: 09/29/19  1220     History Chief Complaint  Patient presents with  . Chest Pain    Christian [name] is a 59 y.o. male.  HPI  Patient is 59 year old male with a history of HTN, HLD, DM and asthma/COPD who he sees [facility] for.  Patient states that he uses Advair for his COPD asthma and albuterol inhaler as needed.  He also uses some nebulizer treatments at home.  He states that for the past 5 days he has had a cough, congestion and felt under the weather.  He states for the past 2 days he has had intermittent chest pains and shortness of breath.  He also endorses wheezing.  He states that this feels like COPD exacerbations he has had in the past.  He denies any current chest pain but states that it seems to come and go.  It is nonexertional nonpleuritic.  He states that it seems to happen at rest when he is coughing or having difficulty breathing.  Patient states that his last steroid taper was given to him by his pulmonologist approximately several months ago.  He is not certain exactly when.  He denies any fevers or chills.  Denies any lightheadedness or dizziness.      Past Medical History:  Diagnosis Date  . Allergy   . Arthritis    "left knee" (01/22/2016)  . Asthma   . Chronic lower back pain   . COPD (chronic obstructive pulmonary disease) ([place])   . Dyspnea    with extertion  . High cholesterol   . Hypertension   . Neuropathy   . OSA on CPAP   . Secondhand smoke exposure   . Sleep apnea    wears CPAP, not wearing now  for months  . Type II diabetes mellitus ([place]) dx'd 01/17/2016    Patient Active Problem List   Diagnosis Date Noted  . Medication management 07/21/2019  . Nasal polyps 06/21/2019  . Allergic rhinitis 04/05/2019  . OSA (obstructive sleep apnea) 04/05/2019  . Presence of left artificial knee joint 08/24/2017  . Eosinophilic asthma  23/30/0762  . Primary osteoarthritis of left knee 02/23/2017  . Chronic pain syndrome 04/02/2016  . Intrinsic asthma 03/25/2016  . Essential hypertension 01/23/2016  . Hyperlipidemia 01/23/2016  . Secondhand smoke exposure 01/23/2016  . Diabetes mellitus with neuropathy ([place]) 01/23/2016    Past Surgical History:  Procedure Laterality Date  . ANTERIOR CERVICAL DECOMP/DISCECTOMY FUSION  ~ 1995   "titanium"  . APPENDECTOMY  ~ 1977  . BACK SURGERY     x 2  L 4disectomy  . COLONOSCOPY W/ POLYPECTOMY    . INGUINAL HERNIA REPAIR Right ~ 2000  . LUMBAR DISC SURGERY  ~ 2000; ~2006  . NASAL SINUS SURGERY  ~ 2007  . TOTAL KNEE ARTHROPLASTY Left 08/06/2017   Procedure: LEFT TOTAL KNEE ARTHROPLASTY;  Surgeon: Leandrew Koyanagi, MD;  Location: [facility];  Service: Orthopedics;  Laterality: Left;       Family History  Problem Relation Age of Onset  . Hypertension Mother   . Hypertension Father   . Heart Problems Brother        Heart transplant, pt does not know cause   . Rectal cancer Brother   . Colon cancer Neg Hx   . Esophageal cancer Neg Hx   . Stomach cancer Neg Hx     Social History  Tobacco Use  . Smoking status: Passive Smoke Exposure - Never Smoker  . Smokeless tobacco: Never Used  . Tobacco comment: passive cig smoking at work  1994- 2012  Substance Use Topics  . Alcohol use: No  . Drug use: No    Home Medications Prior to Admission medications   Medication Sig Start Date End Date Taking? Authorizing Provider  ACCU-CHEK GUIDE test strip USE 1  TEST STRIP ONCE DAILY TO CHECK BLOOD SUGAR 08/26/19   Ladell Pier, MD  albuterol (VENTOLIN HFA) 108 (90 Base) MCG/ACT inhaler INHALE 1 TO 2 PUFFS BY MOUTH EVERY 6 HOURS AS NEEDED FOR WHEEZING FOR SHORTNESS OF BREATH 03/11/19   Ladell Pier, MD  amLODipine (NORVASC) 10 MG tablet Take 1 tablet by mouth once daily 08/03/19   Ladell Pier, MD  amoxicillin (AMOXIL) 500 MG capsule Take 1 capsule (500 mg total) by mouth 3  (three) times daily. 09/08/19   Argentina Donovan, PA-C  aspirin EC 325 MG tablet Take 1 tablet (325 mg total) by mouth 2 (two) times daily. Patient taking differently: Take 325 mg by mouth once.  08/06/17   Leandrew Koyanagi, MD  Blood Glucose Monitoring Suppl (ONE TOUCH ULTRA 2) w/Device KIT Use as directed 3 times daily E11.9 03/31/17   Ladell Pier, MD  cetirizine (ZYRTEC) 10 MG tablet Take 1 tablet (10 mg total) by mouth daily. 11/17/18   Argentina Donovan, PA-C  diclofenac sodium (VOLTAREN) 1 % GEL Apply 2 g 4 (four) times daily topically. Patient taking differently: Apply 2 g topically 4 (four) times daily as needed (for pain.).  03/03/17   Leandrew Koyanagi, MD  fluticasone (FLONASE) 50 MCG/ACT nasal spray Place 2 sprays into both nostrils daily. 01/19/19   Argentina Donovan, PA-C  Fluticasone-Salmeterol (ADVAIR DISKUS) 500-50 MCG/DOSE AEPB Inhale 1 puff into the lungs 2 (two) times daily. 07/21/19   Lauraine Rinne, NP  furosemide (LASIX) 20 MG tablet Take 1 tablet (20 mg total) by mouth daily. X 1 week then prn swelling 09/08/19   Freeman Caldron M, PA-C  gabapentin (NEURONTIN) 300 MG capsule TAKE 1 CAPSULE BY MOUTH THREE TIMES DAILY 02/01/19   Ladell Pier, MD  ipratropium-albuterol (DUONEB) 0.5-2.5 (3) MG/3ML SOLN Take 3 mLs by nebulization every 4 (four) hours as needed. 11/17/18   Argentina Donovan, PA-C  lisinopril-hydrochlorothiazide (ZESTORETIC) 10-12.5 MG tablet Take 1 tablet by mouth once daily 08/03/19   Ladell Pier, MD  Mepolizumab (NUCALA) 100 MG SOLR Inject 100 mg into the skin every 28 (twenty-eight) days. 07/08/19   Olalere, Cicero Duck A, MD  metFORMIN (GLUCOPHAGE) 500 MG tablet Take 500 mg by mouth every morning. 03/22/19   [provider]  methocarbamol (ROBAXIN) 750 MG tablet Take 1 tablet (750 mg total) by mouth 2 (two) times daily as needed for muscle spasms. 08/06/17   Leandrew Koyanagi, MD  montelukast (SINGULAIR) 10 MG tablet Take 1 tablet (10 mg total) by mouth at  bedtime. 03/02/19   Charlott Rakes, MD  ondansetron (ZOFRAN) 4 MG tablet Take 1-2 tablets (4-8 mg total) by mouth every 8 (eight) hours as needed for nausea or vomiting. Patient not taking: Reported on 06/20/2019 08/06/17   Leandrew Koyanagi, MD  pravastatin (PRAVACHOL) 20 MG tablet Take 1 tablet (20 mg total) by mouth at bedtime. 11/17/18   Argentina Donovan, PA-C  predniSONE (DELTASONE) 10 MG tablet 4 tabs for 2 days, then 3 tabs for 2 days, 2 tabs  for 2 days, then 1 tab for 2 days, then stop 07/21/19   Lauraine Rinne, NP  predniSONE (STERAPRED UNI-PAK 21 TAB) 10 MG (21) TBPK tablet Take by mouth daily. Take 6 tabs by mouth daily  for 2 days, then 5 tabs for 2 days, then 4 tabs for 2 days, then 3 tabs for 2 days, 2 tabs for 2 days, then 1 tab by mouth daily for 2 days 09/29/19   Tedd Sias, PA  TRUEPLUS LANCETS 28G MISC Check glucose 2 times a day 02/01/16   Lottie Mussel T, MD  Vitamin D, Ergocalciferol, (DRISDOL) 1.25 MG (50000 UT) CAPS capsule Take 1 capsule (50,000 Units total) by mouth every 7 (seven) days. 08/23/18   Argentina Donovan, PA-C    Allergies    Phenergan [promethazine hcl]  Review of Systems   Review of Systems  Constitutional: Positive for fatigue. Negative for chills and fever.  HENT: Positive for congestion.   Eyes: Negative for pain.  Respiratory: Positive for cough and shortness of breath.   Cardiovascular: Positive for chest pain. Negative for leg swelling.  Gastrointestinal: Negative for abdominal pain and vomiting.  Genitourinary: Negative for dysuria.  Musculoskeletal: Negative for myalgias.  Skin: Negative for rash.  Neurological: Negative for dizziness, syncope, light-headedness and headaches.    Physical Exam Updated Vital Signs BP 122/75   Pulse 69   Temp 98.3 F (36.8 C) (Oral)   Resp 19   SpO2 91%   Physical Exam Vitals and nursing note reviewed.  Constitutional:      General: He is not in acute distress.    Appearance: He is not toxic-appearing.    HENT:     Head: Normocephalic and atraumatic.     Comments: No maxillary or frontal sinus tenderness to palpation    Nose: Nose normal.     Mouth/Throat:     Mouth: Mucous membranes are dry.  Eyes:     General: No scleral icterus. Cardiovascular:     Rate and Rhythm: Normal rate and regular rhythm.     Pulses: Normal pulses.     Heart sounds: Normal heart sounds.  Pulmonary:     Effort: Pulmonary effort is normal. No respiratory distress.     Breath sounds: No wheezing.     Comments: Coarse breath sounds diffusely.  Expiratory wheezes present.  No accessory muscle usage speaking in full sentences.  Mildly tachypneic at rate of 24.  SPO2 98 on my exam on room air. Abdominal:     Palpations: Abdomen is soft.     Tenderness: There is no abdominal tenderness. There is no guarding or rebound.  Musculoskeletal:     Cervical back: Normal range of motion.     Right lower leg: No edema.     Left lower leg: No edema.  Skin:    General: Skin is warm and dry.     Capillary Refill: Capillary refill takes less than 2 seconds.  Neurological:     Mental Status: He is alert. Mental status is at baseline.  Psychiatric:        Mood and Affect: Mood normal.        Behavior: Behavior normal.     ED Results / Procedures / Treatments   Labs (all labs ordered are listed, but only abnormal results are displayed) Labs Reviewed  BASIC METABOLIC PANEL - Abnormal; Notable for the following components:      Result Value   Glucose, Bld 123 (*)    Creatinine,  Ser 1.36 (*)    Calcium 8.2 (*)    GFR calc non Af Amer 57 (*)    All other components within normal limits  SARS CORONAVIRUS 2 BY RT PCR (HOSPITAL ORDER, [facility])  CBC  TROPONIN I (HIGH SENSITIVITY)  TROPONIN I (HIGH SENSITIVITY)    EKG EKG Interpretation  Date/Time:  Thursday September 29 2019 12:20:05 EDT Ventricular Rate:  74 PR Interval:  206 QRS Duration: 80 QT Interval:  390 QTC Calculation: 432 R  Axis:   82 Text Interpretation: Normal sinus rhythm Septal infarct , age undetermined Abnormal ECG No significant change since last tracing Confirmed by Gareth Morgan [phone]) on 09/29/2019 8:20:48 PM   Radiology DG Chest 2 View  Result Date: 09/29/2019 CLINICAL DATA:  Chest pain, shortness of breath, history hypertension, COPD EXAM: CHEST - 2 VIEW COMPARISON:  04/06/2018 FINDINGS: Normal heart size, mediastinal contours, and pulmonary vascularity. Lungs clear. No pleural effusion or pneumothorax. Bones unremarkable. IMPRESSION: Normal exam. Electronically Signed   By: Lavonia Dana M.D.   On: 09/29/2019 12:59    Procedures Procedures (including critical care time)  Medications Ordered in ED Medications  sodium chloride flush (NS) 0.9 % injection 3 mL (3 mLs Intravenous Given 09/29/19 2140)  methylPREDNISolone sodium succinate (SOLU-MEDROL) 125 mg/2 mL injection 125 mg (125 mg Intravenous Given 09/29/19 2139)  magnesium sulfate IVPB 2 g 50 mL (2 g Intravenous New Bag/Given 09/29/19 2140)  albuterol (VENTOLIN HFA) 108 (90 Base) MCG/ACT inhaler 8 puff (8 puffs Inhalation Given 09/29/19 2140)  ipratropium (ATROVENT HFA) inhaler 3 puff (3 puffs Inhalation Given 09/29/19 2140)  acetaminophen (TYLENOL) tablet 1,000 mg (1,000 mg Oral Given 09/29/19 2140)    ED Course  I have reviewed the triage vital signs and the nursing notes.  Pertinent labs & imaging results that were available during my care of the patient were reviewed by me and considered in my medical decision making (see chart for details).  Patient presented today with chest pain shortness of breath as described above in the HPI.  The pain is left-sided nonradiating severe sharp stabbing but intermittent.  He states that it seems to occur mostly at rest but sometimes with exertion but denies any exertional component or any aggravating or mitigating factors.  He states he has been short of breath and also endorses congestion, cough, chills for the  past 5 days.  His symptoms seem to be tied to his wheezing which he states he has been hearing.  Physical exam is notable for significant expiratory wheezing.  We will provide patient with Tylenol, albuterol, ipratropium, mag sulfate, Solu-Medrol.  The causes for shortness of breath include but are not limited to Cardiac (AHF, pericardial effusion and tamponade, arrhythmias, ischemia, etc) Respiratory (COPD, asthma, pneumonia, pneumothorax, primary pulmonary hypertension, PE/VQ mismatch) Hematological (anemia) Neuromuscular (ALS, Guillain-Barr, etc)  Clinical Course as of Sep 29 2343  Thu Sep 29, 2019  2344 Covid swab pending at this time.  CBC without leukocytosis or anemia.  Troponin next to within normal limits.  No significant delta.  BMP without lecture light abnormality.  Kidney function is unchanged from prior.   [WF]  2345 Vital signs are within normal limits during entirety of the ED stay.  Patient ambulated with no desaturation.  He does have oxygen at home but uses intermittently.   [WF]    Clinical Course User Index [WF] Tedd Sias, [place]   MDM Rules/Calculators/A&P  I discussed this case with my attending physician who cosigned this note including patient's presenting symptoms, physical exam, and planned diagnostics and interventions. Attending physician stated agreement with plan or made changes to plan which were implemented.   Patient be discharged with prednisone taper to use at home.  He will call tomorrow morning to make an appointment with his pulmonologist.  He will also follow-up with his primary care doctor.  Patient is agreeable to plan on reassessment he is no longer wheezing at all.  There are some coarse lung sounds present however he is no longer coughing and does not feel any shortness of breath or chest pain.  He is completely asymptomatic at this time.  He is ambulatory without desaturation.  He is well-appearing.  He is agreeable to  plan and understands return precautions.  Final Clinical Impression(s) / ED Diagnoses Final diagnoses:  COPD exacerbation ([place])    Rx / DC Orders ED Discharge Orders         Ordered    predniSONE (STERAPRED UNI-PAK 21 TAB) 10 MG (21) TBPK tablet  Daily     09/29/19 2343           Pati Gallo [place], [place] 09/29/19 2347    Gareth Morgan, MD 09/30/19 1326

## 2019-09-29 NOTE — ED Notes (Signed)
SPO2 94-95% RA on ambulation, no exertional dyspnea noted, gait steady unaided.

## 2019-09-29 NOTE — ED Notes (Signed)
[phone] angela wife please call for  An update

## 2019-09-29 NOTE — ED Triage Notes (Signed)
Pt arrives to ED w/ c/o left sided, non-radiating, 9/10, sharp chest pain that started while relaxing yesterday. Pt endorses sob, denies n/v. Pt denies cardiac hx, however, has hx of HTN, HLD.

## 2019-09-29 NOTE — Discharge Instructions (Signed)
Please use the prednisone I have prescribed you as recommended.  Please follow-up with your pulmonologist.  Please call tomorrow to make an appointment within the next week.  Please return to ED for any new or concerning symptoms.  Please use Tylenol and ibuprofen for pain as recommended below.  Please drink plenty of water.  Please use your inhalers that you have been given here in the ED at home.

## 2019-09-30 DIAGNOSIS — G4733 Obstructive sleep apnea (adult) (pediatric): Secondary | ICD-10-CM | POA: Diagnosis not present

## 2019-09-30 NOTE — ED Notes (Signed)
Patient verbalizes understanding of discharge instructions. Opportunity for questioning and answers were provided. Armband removed by staff, pt discharged from ED.  

## 2019-10-04 ENCOUNTER — Other Ambulatory Visit: Payer: Self-pay

## 2019-10-04 ENCOUNTER — Ambulatory Visit: Payer: Medicare Other | Attending: Internal Medicine | Admitting: Pharmacist

## 2019-10-05 ENCOUNTER — Inpatient Hospital Stay: Payer: Medicare Other | Admitting: Critical Care Medicine

## 2019-10-06 ENCOUNTER — Other Ambulatory Visit: Payer: Self-pay | Admitting: Pulmonary Disease

## 2019-10-06 ENCOUNTER — Ambulatory Visit: Payer: Medicare Other | Admitting: Pharmacist

## 2019-10-06 DIAGNOSIS — J8283 Eosinophilic asthma: Secondary | ICD-10-CM

## 2019-10-06 LAB — PULMONARY FUNCTION TEST
DL/VA % pred: 106 %
DL/VA: 4.55 ml/min/mmHg/L
DLCO unc % pred: 89 %
DLCO unc: 23.44 ml/min/mmHg
FEF 25-75 Post: 2.07 L/sec
FEF 25-75 Pre: 1.36 L/sec
FEF2575-%Change-Post: 51 %
FEF2575-%Pred-Post: 74 %
FEF2575-%Pred-Pre: 49 %
FEV1-%Change-Post: 11 %
FEV1-%Pred-Post: 84 %
FEV1-%Pred-Pre: 75 %
FEV1-Post: 2.46 L
FEV1-Pre: 2.2 L
FEV1FVC-%Change-Post: 8 %
FEV1FVC-%Pred-Pre: 86 %
FEV6-%Change-Post: 4 %
FEV6-%Pred-Post: 92 %
FEV6-%Pred-Pre: 88 %
FEV6-Post: 3.32 L
FEV6-Pre: 3.19 L
FEV6FVC-%Change-Post: 0 %
FEV6FVC-%Pred-Post: 103 %
FEV6FVC-%Pred-Pre: 102 %
FVC-%Change-Post: 3 %
FVC-%Pred-Post: 89 %
FVC-%Pred-Pre: 86 %
FVC-Post: 3.33 L
Post FEV1/FVC ratio: 74 %
Post FEV6/FVC ratio: 100 %
Pre FEV1/FVC ratio: 68 %
Pre FEV6/FVC Ratio: 99 %
RV % pred: 95 %
RV: 2.01 L
TLC % pred: 85 %
TLC: 5.63 L

## 2019-10-06 NOTE — Progress Notes (Signed)
PFT actually done 05/25/19 missing order- but order placed 10/06/19

## 2019-10-11 ENCOUNTER — Other Ambulatory Visit: Payer: Self-pay

## 2019-10-11 ENCOUNTER — Ambulatory Visit: Payer: Medicare Other | Attending: Internal Medicine | Admitting: Pharmacist

## 2019-10-11 ENCOUNTER — Encounter: Payer: Self-pay | Admitting: Pharmacist

## 2019-10-11 VITALS — BP 143/83 | HR 69 | Temp 97.9°F | Ht 70.0 in | Wt 194.0 lb

## 2019-10-11 DIAGNOSIS — Z0000 Encounter for general adult medical examination without abnormal findings: Secondary | ICD-10-CM

## 2019-10-11 NOTE — Progress Notes (Signed)
Subjective:   Christian [name] is a 59 y.o. male who presents for Medicare Annual/Subsequent preventive examination. He has no complaints today.    Objective:    Vitals: BP (!) 143/83   Pulse 69   Temp 97.9 F (36.6 C)   Ht _0  (1.778 m)   Wt 194 lb (88 kg)   BMI 27.84 kg/m   Body mass index is 27.84 kg/m.  Advanced Directives 10/11/2019 04/06/2018 08/06/2017 08/04/2017 05/10/2017 05/05/2017 02/23/2017  Does Patient Have a Medical Advance Directive? _1  No No  Would patient like information on creating a medical advance directive? Yes (Inpatient - patient defers creating a medical advance directive at this time - Information given) No - Patient declined No - Patient declined No - Patient declined No - Patient declined No - Patient declined No - Patient declined    Tobacco Social History   Tobacco Use  Smoking Status Passive Smoke Exposure - Never Smoker  Smokeless Tobacco Never Used  Tobacco Comment   passive cig smoking at work  1994- 2012     Counseling given: Not Answered Comment: passive cig smoking at work  1994- 2012  Clinical Intake:  Pre-visit preparation completed: Yes  Pain : No/denies pain Pain Score: 0-No pain    BMI - recorded: 27.86 Nutritional Status: BMI 25 -29 Overweight Diabetes: Yes CBG done?: No CBG resulted in Enter/ Edit results?: No Did pt. bring in CBG monitor from home?: No  How often do you need to have someone help you when you read instructions, pamphlets, or other written materials from your doctor or pharmacy?: 1 - Never What is the last grade level you completed in school?: 12; Completed some college coursework  Interpreter Needed?: No   Past Medical History:  Diagnosis Date  . Allergy   . Arthritis    "left knee" (01/22/2016)  . Asthma   . Chronic lower back pain   . COPD (chronic obstructive pulmonary disease) ([place])   . Dyspnea    with extertion  . High cholesterol   . Hypertension   . Neuropathy   . OSA  on CPAP   . Secondhand smoke exposure   . Sleep apnea    wears CPAP, not wearing now  for months  . Type II diabetes mellitus ([place]) dx'd 01/17/2016   Past Surgical History:  Procedure Laterality Date  . ANTERIOR CERVICAL DECOMP/DISCECTOMY FUSION  ~ 1995   "titanium"  . APPENDECTOMY  ~ 1977  . BACK SURGERY     x 2  L 4disectomy  . COLONOSCOPY W/ POLYPECTOMY    . INGUINAL HERNIA REPAIR Right ~ 2000  . LUMBAR DISC SURGERY  ~ 2000; ~2006  . NASAL SINUS SURGERY  ~ 2007  . TOTAL KNEE ARTHROPLASTY Left 08/06/2017   Procedure: LEFT TOTAL KNEE ARTHROPLASTY;  Surgeon: Leandrew Koyanagi, MD;  Location: [facility];  Service: Orthopedics;  Laterality: Left;   Family History  Problem Relation Age of Onset  . Hypertension Mother   . Hypertension Father   . Heart Problems Brother        Heart transplant, pt does not know cause   . Rectal cancer Brother   . Colon cancer Neg Hx   . Esophageal cancer Neg Hx   . Stomach cancer Neg Hx    Social History   Socioeconomic History  . Marital status: Married    Spouse name: Not on file  . Number of children: Not on file  .  Years of education: Not on file  . Highest education level: Not on file  Occupational History  . Not on file  Tobacco Use  . Smoking status: Passive Smoke Exposure - Never Smoker  . Smokeless tobacco: Never Used  . Tobacco comment: passive cig smoking at work  1994- 2012  Vaping Use  . Vaping Use: Never used  Substance and Sexual Activity  . Alcohol use: No  . Drug use: No  . Sexual activity: Yes  Other Topics Concern  . Not on file  Social History Narrative  . Not on file   Social Determinants of Health   Financial Resource Strain:   . Difficulty of Paying Living Expenses:   Food Insecurity:   . Worried About Charity fundraiser in the Last Year:   . Arboriculturist in the Last Year:   Transportation Needs:   . Film/video editor (Medical):   Marland Kitchen Lack of Transportation (Non-Medical):   Physical Activity:   . Days of  Exercise per Week:   . Minutes of Exercise per Session:   Stress:   . Feeling of Stress :   Social Connections:   . Frequency of Communication with Friends and Family:   . Frequency of Social Gatherings with Friends and Family:   . Attends Religious Services:   . Active Member of Clubs or Organizations:   . Attends Archivist Meetings:   Marland Kitchen Marital Status:     Outpatient Encounter Medications as of 10/11/2019  Medication Sig  . ACCU-CHEK GUIDE test strip USE 1  TEST STRIP ONCE DAILY TO CHECK BLOOD SUGAR  . albuterol (VENTOLIN HFA) 108 (90 Base) MCG/ACT inhaler INHALE 1 TO 2 PUFFS BY MOUTH EVERY 6 HOURS AS NEEDED FOR WHEEZING FOR SHORTNESS OF BREATH  . amLODipine (NORVASC) 10 MG tablet Take 1 tablet by mouth once daily  . Blood Glucose Monitoring Suppl (ONE TOUCH ULTRA 2) w/Device KIT Use as directed 3 times daily E11.9  . cetirizine (ZYRTEC) 10 MG tablet Take 1 tablet (10 mg total) by mouth daily.  . fluticasone (FLONASE) 50 MCG/ACT nasal spray Place 2 sprays into both nostrils daily.  . Fluticasone-Salmeterol (ADVAIR DISKUS) 500-50 MCG/DOSE AEPB Inhale 1 puff into the lungs 2 (two) times daily.  . furosemide (LASIX) 20 MG tablet Take 1 tablet (20 mg total) by mouth daily. X 1 week then prn swelling  . gabapentin (NEURONTIN) 300 MG capsule TAKE 1 CAPSULE BY MOUTH THREE TIMES DAILY  . ipratropium-albuterol (DUONEB) 0.5-2.5 (3) MG/3ML SOLN Take 3 mLs by nebulization every 4 (four) hours as needed.  Marland Kitchen lisinopril-hydrochlorothiazide (ZESTORETIC) 10-12.5 MG tablet Take 1 tablet by mouth once daily  . Mepolizumab (NUCALA) 100 MG SOLR Inject 100 mg into the skin every 28 (twenty-eight) days.  . metFORMIN (GLUCOPHAGE) 500 MG tablet Take 500 mg by mouth every morning.  . montelukast (SINGULAIR) 10 MG tablet Take 1 tablet (10 mg total) by mouth at bedtime.  . pravastatin (PRAVACHOL) 20 MG tablet Take 1 tablet (20 mg total) by mouth at bedtime.  . predniSONE (STERAPRED UNI-PAK 21 TAB) 10  MG (21) TBPK tablet Take by mouth daily. Take 6 tabs by mouth daily  for 2 days, then 5 tabs for 2 days, then 4 tabs for 2 days, then 3 tabs for 2 days, 2 tabs for 2 days, then 1 tab by mouth daily for 2 days  . TRUEPLUS LANCETS 28G MISC Check glucose 2 times a day  . Vitamin D, Ergocalciferol, (  DRISDOL) 1.25 MG (50000 UT) CAPS capsule Take 1 capsule (50,000 Units total) by mouth every 7 (seven) days.  . [DISCONTINUED] amoxicillin (AMOXIL) 500 MG capsule Take 1 capsule (500 mg total) by mouth 3 (three) times daily.  . [DISCONTINUED] aspirin EC 325 MG tablet Take 1 tablet (325 mg total) by mouth 2 (two) times daily. (Patient taking differently: Take 325 mg by mouth once. )  . [DISCONTINUED] diclofenac sodium (VOLTAREN) 1 % GEL Apply 2 g 4 (four) times daily topically. (Patient not taking: Reported on 10/11/2019)  . [DISCONTINUED] methocarbamol (ROBAXIN) 750 MG tablet Take 1 tablet (750 mg total) by mouth 2 (two) times daily as needed for muscle spasms. (Patient not taking: Reported on 10/11/2019)  . [DISCONTINUED] ondansetron (ZOFRAN) 4 MG tablet Take 1-2 tablets (4-8 mg total) by mouth every 8 (eight) hours as needed for nausea or vomiting. (Patient not taking: Reported on 06/20/2019)  . [DISCONTINUED] predniSONE (DELTASONE) 10 MG tablet 4 tabs for 2 days, then 3 tabs for 2 days, 2 tabs for 2 days, then 1 tab for 2 days, then stop   No facility-administered encounter medications on file as of 10/11/2019.    Activities of Daily Living In your present state of health, do you have any difficulty performing the following activities: 10/11/2019  Hearing? N  Vision? N  Difficulty concentrating or making decisions? N  Walking or climbing stairs? N  Dressing or bathing? N  Doing errands, shopping? N  Preparing Food and eating ? N  Using the Toilet? N  In the past six months, have you accidently leaked urine? N  Do you have problems with loss of bowel control? N  Managing your Medications? N  Managing  your Finances? N  Housekeeping or managing your Housekeeping? N  Some recent data might be hidden    Patient Care Team: Ladell Pier, MD as PCP - General (Internal Medicine)   Assessment:   This is a routine wellness examination for [place].  Exercise Activities and Dietary recommendations Current Exercise Habits: Home exercise routine (Walks 3 days/week for ~30 minutes), Type of exercise: walking, Time (Minutes): 30, Frequency (Times/Week): 3, Weekly Exercise (Minutes/Week): 90, Intensity: Mild, Exercise limited by: None identified  Goals   None     Fall Risk Fall Risk  10/11/2019 03/02/2019 07/26/2018 07/22/2017 07/22/2017  Falls in the past year? 0 0 0 No No  Number falls in past yr: 0 - - - -  Injury with Fall? 0 - - - -  Follow up Falls evaluation completed;Education provided;Falls prevention discussed - - - -   Is the patient's home free of loose throw rugs in walkways, pet beds, electrical cords, etc?   yes      Grab bars in the bathroom? no      Handrails on the stairs?   no      Adequate lighting?   yes  Timed Get Up and Go Performed: <5 seconds   Depression Screen PHQ 2/9 Scores 10/11/2019 06/20/2019 05/05/2019 07/26/2018  PHQ - 2 Score 0 0 0 0  PHQ- 9 Score - - - -   Cognitive Function MMSE - Mini Mental State Exam 10/11/2019  Orientation to time 5  Orientation to Place 5  Registration 3  Attention/ Calculation 5  Recall 3  Language- name 2 objects 2  Language- repeat 1  Language- follow 3 step command 3  Language- read & follow direction 1  Write a sentence 1  Copy design 1  Total score 30  Immunization History  Administered Date(s) Administered  . Influenza Inj Mdck Quad Pf 05/29/2018  . Influenza Split 02/23/2016  . Influenza,inj,Quad PF,6+ Mos 12/22/2016  . Influenza-Unspecified 02/07/2019  . PFIZER SARS-COV-2 Vaccination 07/14/2019, 08/10/2019  . Pneumococcal Conjugate-13 03/25/2016  . Pneumococcal Polysaccharide-23 07/16/2016  . Tdap  04/02/2016   Qualifies for Shingles Vaccine? Yes   Screening Tests Health Maintenance  Topic Date Due  . FOOT EXAM  04/02/2017  . OPHTHALMOLOGY EXAM  10/23/2018  . INFLUENZA VACCINE  11/27/2019  . HEMOGLOBIN A1C  12/18/2019  . COLONOSCOPY  07/29/2022  . TETANUS/TDAP  04/02/2026  . PNEUMOCOCCAL POLYSACCHARIDE VACCINE AGE 73-64 HIGH RISK  Completed  . COVID-19 Vaccine  Completed  . Hepatitis C Screening  Completed  . HIV Screening  Completed   Cancer Screenings: Lung: Low Dose CT Chest recommended if Age 38-80 years, 30 pack-year currently smoking OR have quit w/in 15years. Patient does not qualify. Colorectal: Not due again until 2024  Additional Screenings:  Hepatitis C Screening: completed       Plan:  This is a subsequent Medicare AWV for this patient.  I have personally reviewed and noted the following in the patient's chart:   . Medical and social history . Use of alcohol, tobacco or illicit drugs  . Current medications and supplements . Functional ability and status . Nutritional status . Physical activity . Advanced directives . List of other physicians . Hospitalizations, surgeries, and ER visits in previous 12 months . Vitals . Screenings to include cognitive, depression, and falls . Referrals and appointments  In addition, I have reviewed and discussed with patient certain preventive protocols, quality metrics, and best practice recommendations. A written personalized care plan for preventive services as well as general preventive health recommendations were provided to patient.  Tresa Endo, RPH-CPP  10/11/2019

## 2019-10-14 ENCOUNTER — Other Ambulatory Visit: Payer: Self-pay

## 2019-10-14 ENCOUNTER — Encounter: Payer: Self-pay | Admitting: Acute Care

## 2019-10-14 ENCOUNTER — Ambulatory Visit (INDEPENDENT_AMBULATORY_CARE_PROVIDER_SITE_OTHER): Payer: Medicare Other | Admitting: Acute Care

## 2019-10-14 DIAGNOSIS — J329 Chronic sinusitis, unspecified: Secondary | ICD-10-CM

## 2019-10-14 DIAGNOSIS — J4541 Moderate persistent asthma with (acute) exacerbation: Secondary | ICD-10-CM

## 2019-10-14 DIAGNOSIS — R0981 Nasal congestion: Secondary | ICD-10-CM

## 2019-10-14 MED ORDER — CETIRIZINE HCL 10 MG PO TABS: 10.0000 mg | ORAL_TABLET | Freq: Every day | ORAL | 11 refills | Status: DC

## 2019-10-14 MED ORDER — FLUTICASONE PROPIONATE 50 MCG/ACT NA SUSP: 2.0000 | Freq: Every day | NASAL | 6 refills | Status: DC

## 2019-10-14 MED ORDER — IPRATROPIUM-ALBUTEROL 0.5-2.5 (3) MG/3ML IN SOLN: 3.0000 mL | RESPIRATORY_TRACT | 2 refills | Status: DC | PRN

## 2019-10-14 NOTE — Telephone Encounter (Signed)
Patient had appointment at clinic today, provided patient with Nucala PAP form to sign and complete. Program also requires prescription print out to accompany application. Provider forms also given to nurse for MD signature.   Will follow up.

## 2019-10-14 NOTE — Patient Instructions (Addendum)
It is good to see you today.  I am glad you are feeling better after your ED visit.  Continue your Advair 1 puffs twice daily. Rinse mouth after use. Use your Duo nebs as needed for breakthrough shortness of breath of wheezing.  We will renew your DuoNebs today, your Fluticasone and your Zyrtec  today Continue your Flonase, Singulair, Zyrtec as you have been doing. We will look into what we need to do to get your Nucala injections started. Follow up in 3 months with Dr. Ander Slade. Please call for an appointment at first signs of trouble. Remember to resume your CPAP at bedtime.  Please contact office for sooner follow up if symptoms do not improve or worsen or seek emergency care

## 2019-10-14 NOTE — Progress Notes (Signed)
History of Present Illness Christian [name] is a 59 y.o. male with Eosinophilic asthma, OSA  and COPD. He is followed by Dr. Ander [name].   10/14/2019 Follow up after ED visit. Pt was seen in the ED 09/29/2019 for what he called a asthma exacerbation. He states he presented with shortness of breath and congestion, cough, wheezing  and chills that had been ongoing x 5 days. He was treated with Solumedrol IV, magnesium, albuterol and Atrovent.He states that he felt better immediately after getting Solumedrol IV. He was discharged on a prednisone taper. He presents today for follow up. He states he is feeling better. He has 2 days left of the pred taper he received from the ED. He has had no wheezing since.  He states he had been compliant with his Advair and his nebs treatments. He states he is using his rescue inhaler about once daily. He states he was having some shortness of breath in the mornings. This has resolved. He states he stopped using his CPAP since he was sick. He states he is planning on resuming this tonight. He states his dry cough has resolved. He states he is compliant with his Singulair and nasal sprays and Zyrtec.He states he has returned to baseline .   Test Results: 04/06/2018-chest x-ray-negative, no cardiopulmonary abnormality  10/06/2018-CBC with differential-eosinophils relative 7.1, eosinophils absolute 0.4 10/06/2018-IgE-273  12/15/2018-home sleep study-AHI 12.1, SaO2 low 83%  05/07/2016-pulmonary function test-FVC 3.11 (82% predicted), postbronchodilator ratio 70, postbronchodilator FEV1 1.73 (58% predicted), no bronchodilator response, DLCO 22.17 (74% predicted)  01/23/2016-echocardiogram-LV ejection fraction 65 to 02%, grade 1 diastolic dysfunction   Social History       Tobacco Use  Smoking Status Passive Smoke Exposure - Never Smoker  Smokeless Tobacco Never Used  Tobacco Comment   passive cig smoking at work  1994- 2012       Immunization History   Administered Date(s) Administered  . Influenza Inj Mdck Quad Pf 05/29/2018  . Influenza Split 02/23/2016  . Influenza,inj,Quad PF,6+ Mos 12/22/2016  . Influenza-Unspecified 02/07/2019  . PFIZER SARS-COV-2 Vaccination 07/14/2019  . Pneumococcal Conjugate-13 03/25/2016  . Pneumococcal Polysaccharide-23 07/16/2016  . Tdap 04/02/2016        CBC Latest Ref Rng & Units 09/29/2019 10/06/2018 08/19/2018  WBC 4.0 - 10.5 K/uL 4.0 5.3 7.7  Hemoglobin 13.0 - 17.0 g/dL 15.3 14.2 16.2  Hematocrit 39 - 52 % 46.1 42.9 48.8  Platelets 150 - 400 K/uL 225 255.0 266    BMP Latest Ref Rng & Units 09/29/2019 09/08/2019 09/14/2018  Glucose 70 - 99 mg/dL 123(H) 97 122(H)  BUN 6 - 20 mg/dL 6 11 14   Creatinine 0.61 - 1.24 mg/dL 1.36(H) 1.25 1.28(H)  BUN/Creat Ratio 9 - 20 - 9 11  Sodium 135 - 145 mmol/L 141 140 139  Potassium 3.5 - 5.1 mmol/L 3.9 4.4 3.8  Chloride 98 - 111 mmol/L 109 105 104  CO2 22 - 32 mmol/L 24 22 23   Calcium 8.9 - 10.3 mg/dL 8.2(L) 8.5(L) 8.3(L)    BNP    Component Value Date/Time   BNP <2.5 09/08/2019 1515    ProBNP No results found for: PROBNP  PFT    Component Value Date/Time   FEV1PRE 2.20 05/25/2019 1152   FEV1POST 2.46 05/25/2019 1152   FVCPRE 3.22 04/05/2019 0857   FVCPOST 3.33 05/25/2019 1152   TLC 5.63 05/25/2019 1152   DLCOUNC 23.44 05/25/2019 1152   PREFEV1FVCRT 68 05/25/2019 1152   PSTFEV1FVCRT 74 05/25/2019 1152  DG Chest 2 View  Result Date: 09/29/2019 CLINICAL DATA:  Chest pain, shortness of breath, history hypertension, COPD EXAM: CHEST - 2 VIEW COMPARISON:  04/06/2018 FINDINGS: Normal heart size, mediastinal contours, and pulmonary vascularity. Lungs clear. No pleural effusion or pneumothorax. Bones unremarkable. IMPRESSION: Normal exam. Electronically Signed   By: Lavonia Dana M.D.   On: 09/29/2019 12:59     Past medical hx Past Medical History:  Diagnosis Date  . Allergy   . Arthritis    "left knee" (01/22/2016)  . Asthma   . Chronic  lower back pain   . COPD (chronic obstructive pulmonary disease) ([place])   . Dyspnea    with extertion  . High cholesterol   . Hypertension   . Neuropathy   . OSA on CPAP   . Secondhand smoke exposure   . Sleep apnea    wears CPAP, not wearing now  for months  . Type II diabetes mellitus ([place]) dx'd 01/17/2016     Social History   Tobacco Use  . Smoking status: Passive Smoke Exposure - Never Smoker  . Smokeless tobacco: Never Used  . Tobacco comment: passive cig smoking at work  1994- 2012  Vaping Use  . Vaping Use: Never used  Substance Use Topics  . Alcohol use: No  . Drug use: No    [name] reports that he is a non-smoker but has been exposed to tobacco smoke. He has never used smokeless tobacco. He reports that he does not drink alcohol and does not use drugs.  Tobacco Cessation: Never smoker, but passive smoke through work  Past surgical hx, Family hx, Social hx all reviewed.  Current Outpatient Medications on File Prior to Visit  Medication Sig  . ACCU-CHEK GUIDE test strip USE 1  TEST STRIP ONCE DAILY TO CHECK BLOOD SUGAR  . albuterol (VENTOLIN HFA) 108 (90 Base) MCG/ACT inhaler INHALE 1 TO 2 PUFFS BY MOUTH EVERY 6 HOURS AS NEEDED FOR WHEEZING FOR SHORTNESS OF BREATH  . amLODipine (NORVASC) 10 MG tablet Take 1 tablet by mouth once daily  . Blood Glucose Monitoring Suppl (ONE TOUCH ULTRA 2) w/Device KIT Use as directed 3 times daily E11.9  . cetirizine (ZYRTEC) 10 MG tablet Take 1 tablet (10 mg total) by mouth daily.  . fluticasone (FLONASE) 50 MCG/ACT nasal spray Place 2 sprays into both nostrils daily.  . Fluticasone-Salmeterol (ADVAIR DISKUS) 500-50 MCG/DOSE AEPB Inhale 1 puff into the lungs 2 (two) times daily.  . furosemide (LASIX) 20 MG tablet Take 1 tablet (20 mg total) by mouth daily. X 1 week then prn swelling  . gabapentin (NEURONTIN) 300 MG capsule TAKE 1 CAPSULE BY MOUTH THREE TIMES DAILY  . ipratropium-albuterol (DUONEB) 0.5-2.5 (3) MG/3ML SOLN Take 3 mLs  by nebulization every 4 (four) hours as needed.  Marland Kitchen lisinopril-hydrochlorothiazide (ZESTORETIC) 10-12.5 MG tablet Take 1 tablet by mouth once daily  . metFORMIN (GLUCOPHAGE) 500 MG tablet Take 500 mg by mouth every morning.  . montelukast (SINGULAIR) 10 MG tablet Take 1 tablet (10 mg total) by mouth at bedtime.  . pravastatin (PRAVACHOL) 20 MG tablet Take 1 tablet (20 mg total) by mouth at bedtime.  . predniSONE (STERAPRED UNI-PAK 21 TAB) 10 MG (21) TBPK tablet Take by mouth daily. Take 6 tabs by mouth daily  for 2 days, then 5 tabs for 2 days, then 4 tabs for 2 days, then 3 tabs for 2 days, 2 tabs for 2 days, then 1 tab by mouth daily for 2 days  .  TRUEPLUS LANCETS 28G MISC Check glucose 2 times a day  . Vitamin D, Ergocalciferol, (DRISDOL) 1.25 MG (50000 UT) CAPS capsule Take 1 capsule (50,000 Units total) by mouth every 7 (seven) days.  . Mepolizumab (NUCALA) 100 MG SOLR Inject 100 mg into the skin every 28 (twenty-eight) days. (Patient not taking: Reported on 10/14/2019)   No current facility-administered medications on file prior to visit.     Allergies  Allergen Reactions  . Phenergan [Promethazine Hcl] Itching and Other (See Comments)    burning    Review Of Systems:  Constitutional:   No  weight loss, night sweats,  Fevers, chills, fatigue, or  lassitude.  HEENT:   No headaches,  Difficulty swallowing,  Tooth/dental problems, or  Sore throat,                No sneezing, itching, ear ache, nasal congestion, post nasal drip,   CV:  No chest pain,  Orthopnea, PND, swelling in lower extremities, anasarca, dizziness, palpitations, syncope.   GI  No heartburn, indigestion, abdominal pain, nausea, vomiting, diarrhea, change in bowel habits, loss of appetite, bloody stools.   Resp: No shortness of breath with exertion or at rest.  No excess mucus, no productive cough,  No non-productive cough,  No coughing up of blood.  No change in color of mucus.  No wheezing.  No chest wall  deformity  Skin: no rash or lesions.  GU: no dysuria, change in color of urine, no urgency or frequency.  No flank pain, no hematuria   MS:  No joint pain or swelling.  No decreased range of motion.  No back pain.  Psych:  No change in mood or affect. No depression or anxiety.  No memory loss.   Vital Signs BP 140/76 (BP Location: Right Arm, Cuff Size: Normal)   Pulse 76   Temp 98.3 F (36.8 C) (Oral)   Ht 5' 10"  (1.778 m)   Wt 194 lb (88 kg)   SpO2 96%   BMI 27.84 kg/m    Physical Exam:  General- No distress,  A&Ox3, pleasant ENT: No sinus tenderness, TM clear, pale nasal mucosa, no oral exudate,no post nasal drip, no LAN Cardiac: S1, S2, regular rate and rhythm, no murmur Chest: No wheeze/ rales/ dullness; no accessory muscle use, no nasal flaring, no sternal retractions Abd.: Soft Non-tender, ND, BS +, Body mass index is 27.84 kg/m. Ext: No clubbing cyanosis, edema Neuro:  normal strength, MAE x 4, A&O x 3 Skin: No rashes, no lesions, warm and dry Psych: normal mood and behavior   Assessment/Plan  Resolved Exacerbation of Eosinophilic asthma ED Visit 2/8>>  Mag/ Solumerdol/ Albuterol nebs/ pred Taper Back to baseline Needs financial assistance for Nucala Plan Continue your Advair 1 puffs twice daily without fail. Rinse mouth after use. Use your Duo nebs as needed for breakthrough shortness of breath of wheezing.  We will renew your DuoNebs today, your Fluticasone and your Zyrtec  today Continue your Flonase, Singulair, Zyrtec as you have been doing. We will look into what we need to do to get your Nucala injections started. Please fill out the paperwork we have provided today Follow up in 3 months with Dr. Ander [name]. Please call for an appointment at first signs of trouble/ congestion/ new cough.  OSA Has not used CPAP since asthma flare Plan Please resume CPAP therapy now that you are better. Continue on CPAP at bedtime. You appear to be benefiting from the  treatment  Goal is to  wear for at least 6 hours each night for maximal clinical benefit. Continue to work on weight loss, as the link between excess weight  and sleep apnea is well established.   Remember to establish a good bedtime routine, and work on sleep hygiene.  Limit daytime naps , avoid stimulants such as caffeine and nicotine close to bedtime, exercise daily to promote sleep quality, avoid heavy , spicy, fried , or rich foods before bed. Ensure adequate exposure to natural light during the day,establish a relaxing bedtime routine with a pleasant sleep environment ( Bedroom between 60 and 67 degrees, turn off bright lights , TV or device screens screens , consider black out curtains or white noise machines) Do not drive if sleepy. Remember to clean mask, tubing, filter, and reservoir once weekly with soapy water.   This appointment was 30 min long with over 50% of the time in direct face-to-face patient care, assessment, plan of care, and follow-up.    Magdalen Spatz, NP 10/14/2019  9:31 AM

## 2019-10-18 ENCOUNTER — Other Ambulatory Visit: Payer: Self-pay

## 2019-10-18 ENCOUNTER — Ambulatory Visit: Payer: Medicare Other | Attending: Internal Medicine | Admitting: Internal Medicine

## 2019-10-18 DIAGNOSIS — M25572 Pain in left ankle and joints of left foot: Secondary | ICD-10-CM

## 2019-10-18 DIAGNOSIS — E785 Hyperlipidemia, unspecified: Secondary | ICD-10-CM

## 2019-10-18 DIAGNOSIS — J8283 Eosinophilic asthma: Secondary | ICD-10-CM

## 2019-10-18 DIAGNOSIS — I10 Essential (primary) hypertension: Secondary | ICD-10-CM | POA: Diagnosis not present

## 2019-10-18 DIAGNOSIS — E119 Type 2 diabetes mellitus without complications: Secondary | ICD-10-CM

## 2019-10-18 MED ORDER — PRAVASTATIN SODIUM 20 MG PO TABS: 20.0000 mg | ORAL_TABLET | Freq: Every day | ORAL | 0 refills | Status: DC

## 2019-10-18 MED ORDER — ALBUTEROL SULFATE HFA 108 (90 BASE) MCG/ACT IN AERS: INHALATION_SPRAY | RESPIRATORY_TRACT | 11 refills | Status: DC

## 2019-10-18 MED ORDER — METFORMIN HCL 500 MG PO TABS: 500.0000 mg | ORAL_TABLET | Freq: Every morning | ORAL | 1 refills | Status: DC

## 2019-10-18 MED ORDER — FUROSEMIDE 20 MG PO TABS: 20.0000 mg | ORAL_TABLET | Freq: Every day | ORAL | 0 refills | Status: DC

## 2019-10-18 MED ORDER — LISINOPRIL-HYDROCHLOROTHIAZIDE 10-12.5 MG PO TABS: 1.0000 | ORAL_TABLET | Freq: Every day | ORAL | 2 refills | Status: DC

## 2019-10-18 MED ORDER — AMLODIPINE BESYLATE 10 MG PO TABS: 10.0000 mg | ORAL_TABLET | Freq: Every day | ORAL | 3 refills | Status: DC

## 2019-10-18 MED ORDER — PREDNISONE 10 MG PO TABS: ORAL_TABLET | ORAL | 0 refills | Status: DC

## 2019-10-18 MED ORDER — MONTELUKAST SODIUM 10 MG PO TABS: 10.0000 mg | ORAL_TABLET | Freq: Every day | ORAL | 1 refills | Status: DC

## 2019-10-18 MED ORDER — FLUTICASONE-SALMETEROL 500-50 MCG/DOSE IN AEPB: 1.0000 | INHALATION_SPRAY | Freq: Two times a day (BID) | RESPIRATORY_TRACT | 3 refills | Status: DC

## 2019-10-18 MED ORDER — GABAPENTIN 300 MG PO CAPS: 300.0000 mg | ORAL_CAPSULE | Freq: Three times a day (TID) | ORAL | 3 refills | Status: DC

## 2019-10-18 NOTE — Progress Notes (Signed)
Virtual Visit via Telephone Note Due to current restrictions/limitations of in-office visits due to the COVID-19 pandemic, this scheduled clinical appointment was converted to a telehealth visit I connected with Christian [name] on 10/18/19 at 9:34 a.m EST I am in my office.  The patient is at home.  Only the patient and myself participated in this encounter.  I discussed the limitations, risks, security and privacy concerns of performing an evaluation and management service by telephone and the availability of in person appointments. I also discussed with the patient that there may be a patient responsible charge related to this service. The patient expressed understanding and agreed to proceed.   History of Present Illness: History of HTN, DM type II, HL, moderate persistent asthma, vit D def, OSA on CPAP.  Pt c/o pain and swelling in LT ankle that started last night.  Rates pain as 9/10 and it goes and comes; when it comes on it last 1-2 mins.  -no initiating factors but wondered where he was bitten by something.  No redness or drainage. No swelling or pain in LT ankle. Took Furosemide this a.m.  No SOB at rest.  No known hx or gout. No recent long distance travel.  DM:  Checks BS every morning.  Range 99-120. Was on Prednisone earlier this mth.  Reports BS was no higher  -taking Metformin..  Doing okay with eating habits Did not get eye exam as yet.  Reports his insurance sent someone to his house. He will set up appt with his eye doctor Lab Results  Component Value Date   HGBA1C 6.3 06/20/2019   HTN: Reports compliance with blood pressure medications.  States that he checks blood pressure intermittently.  Last blood pressure reading was 135/65.  He limits salt in the foods.  Eosinophilic Asthma:  Saw pulmonary 07/21/2019. Plan to get him on [place] but pt has not filled out the paper work needed for them to move forward with ordering the med     Outpatient Encounter Medications as of  10/18/2019  Medication Sig  . ACCU-CHEK GUIDE test strip USE 1  TEST STRIP ONCE DAILY TO CHECK BLOOD SUGAR  . albuterol (VENTOLIN HFA) 108 (90 Base) MCG/ACT inhaler INHALE 1 TO 2 PUFFS BY MOUTH EVERY 6 HOURS AS NEEDED FOR WHEEZING FOR SHORTNESS OF BREATH  . amLODipine (NORVASC) 10 MG tablet Take 1 tablet by mouth once daily  . Blood Glucose Monitoring Suppl (ONE TOUCH ULTRA 2) w/Device KIT Use as directed 3 times daily E11.9  . cetirizine (ZYRTEC) 10 MG tablet Take 1 tablet (10 mg total) by mouth daily.  . fluticasone (FLONASE) 50 MCG/ACT nasal spray Place 2 sprays into both nostrils daily.  . Fluticasone-Salmeterol (ADVAIR DISKUS) 500-50 MCG/DOSE AEPB Inhale 1 puff into the lungs 2 (two) times daily.  . furosemide (LASIX) 20 MG tablet Take 1 tablet (20 mg total) by mouth daily. X 1 week then prn swelling  . gabapentin (NEURONTIN) 300 MG capsule TAKE 1 CAPSULE BY MOUTH THREE TIMES DAILY  . ipratropium-albuterol (DUONEB) 0.5-2.5 (3) MG/3ML SOLN Take 3 mLs by nebulization every 4 (four) hours as needed.  [place] Kitchen lisinopril-hydrochlorothiazide (ZESTORETIC) 10-12.5 MG tablet Take 1 tablet by mouth once daily  . Mepolizumab (NUCALA) 100 MG SOLR Inject 100 mg into the skin every 28 (twenty-eight) days. (Patient not taking: Reported on 10/14/2019)  . metFORMIN (GLUCOPHAGE) 500 MG tablet Take 500 mg by mouth every morning.  . montelukast (SINGULAIR) 10 MG tablet Take 1 tablet (10 mg total)  by mouth at bedtime.  . pravastatin (PRAVACHOL) 20 MG tablet Take 1 tablet (20 mg total) by mouth at bedtime.  . predniSONE (STERAPRED UNI-PAK 21 TAB) 10 MG (21) TBPK tablet Take by mouth daily. Take 6 tabs by mouth daily  for 2 days, then 5 tabs for 2 days, then 4 tabs for 2 days, then 3 tabs for 2 days, 2 tabs for 2 days, then 1 tab by mouth daily for 2 days (Patient not taking: Reported on 10/18/2019)  . TRUEPLUS LANCETS 28G MISC Check glucose 2 times a day  . Vitamin D, Ergocalciferol, (DRISDOL) 1.25 MG (50000 UT) CAPS  capsule Take 1 capsule (50,000 Units total) by mouth every 7 (seven) days. (Patient not taking: Reported on 10/18/2019)   No facility-administered encounter medications on file as of 10/18/2019.    Observations/Objective: Results for orders placed or performed in visit on 10/06/19  Pulmonary function test  Result Value Ref Range   FVC-%Pred-Pre 86 %   FVC-Post 3.33 L   FVC-%Pred-Post 89 %   FVC-%Change-Post 3 %   FEV1-Pre 2.20 L   FEV1-%Pred-Pre 75 %   FEV1-Post 2.46 L   FEV1-%Pred-Post 84 %   FEV1-%Change-Post 11 %   FEV6-Pre 3.19 L   FEV6-%Pred-Pre 88 %   FEV6-Post 3.32 L   FEV6-%Pred-Post 92 %   FEV6-%Change-Post 4 %   Pre FEV1/FVC ratio 68 %   FEV1FVC-%Pred-Pre 86 %   Post FEV1/FVC ratio 74 %   FEV1FVC-%Change-Post 8 %   Pre FEV6/FVC Ratio 99 %   FEV6FVC-%Pred-Pre 102 %   Post FEV6/FVC ratio 100 %   FEV6FVC-%Pred-Post 103 %   FEV6FVC-%Change-Post 0 %   FEF 25-75 Pre 1.36 L/sec   FEF2575-%Pred-Pre 49 %   FEF 25-75 Post 2.07 L/sec   FEF2575-%Pred-Post 74 %   FEF2575-%Change-Post 51 %   RV 2.01 L   RV % pred 95 %   TLC 5.63 L   TLC % pred 85 %   DLCO unc 23.44 ml/min/mmHg   DLCO unc % pred 89 %   DL/VA 4.55 ml/min/mmHg/L   DL/VA % pred 106 %    Assessment and Plan: 1. Acute left ankle pain Differential diagnoses include acute inflammatory arthritis.  I will put him on a short course of prednisone to see if any improvement.  Advised patient that if this does not improve or gets worse he needs to come in for further evaluation right away - predniSONE (DELTASONE) 10 MG tablet; 3 tablets daily x1 days Po then, 2 tablets daily x2 days then 1 tablet daily x2 days  Dispense: 9 tablet; Refill: 0  2. Type 2 diabetes mellitus without complication, without long-term current use of insulin ([place]) Reported blood sugars at goal.  Continue Metformin.  Continue healthy eating habits - metFORMIN (GLUCOPHAGE) 500 MG tablet; Take 1 tablet (500 mg total) by mouth every morning.   Dispense: 90 tablet; Refill: 1  3. Essential hypertension Reported home blood pressure readings close to goal.  Continue current medications and low-salt diet - amLODipine (NORVASC) 10 MG tablet; Take 1 tablet (10 mg total) by mouth daily.  Dispense: 90 tablet; Refill: 3 - lisinopril-hydrochlorothiazide (ZESTORETIC) 10-12.5 MG tablet; Take 1 tablet by mouth daily.  Dispense: 90 tablet; Refill: 2  4. Hyperlipidemia, unspecified hyperlipidemia type Refill Pravachol - pravastatin (PRAVACHOL) 20 MG tablet; Take 1 tablet (20 mg total) by mouth at bedtime.  Dispense: 90 tablet; Refill: 0  5. Eosinophilic asthma Followed by pulmonary. - montelukast (SINGULAIR) 10 MG  tablet; Take 1 tablet (10 mg total) by mouth at bedtime.  Dispense: 90 tablet; Refill: 1 - albuterol (VENTOLIN HFA) 108 (90 Base) MCG/ACT inhaler; INHALE 1 TO 2 PUFFS BY MOUTH EVERY 6 HOURS AS NEEDED FOR WHEEZING FOR SHORTNESS OF BREATH  Dispense: 9 g; Refill: 11 - Fluticasone-Salmeterol (ADVAIR DISKUS) 500-50 MCG/DOSE AEPB; Inhale 1 puff into the lungs 2 (two) times daily.  Dispense: 60 each; Refill: 3   Follow Up Instructions: F/u in 4 mth   I discussed the assessment and treatment plan with the patient. The patient was provided an opportunity to ask questions and all were answered. The patient agreed with the plan and demonstrated an understanding of the instructions.   The patient was advised to call back or seek an in-person evaluation if the symptoms worsen or if the condition fails to improve as anticipated.  I provided 20 minutes of non-face-to-face time during this encounter.   Karle Plumber, MD

## 2019-10-27 ENCOUNTER — Encounter: Payer: Self-pay | Admitting: Acute Care

## 2019-10-30 DIAGNOSIS — G4733 Obstructive sleep apnea (adult) (pediatric): Secondary | ICD-10-CM | POA: Diagnosis not present

## 2019-11-10 NOTE — Telephone Encounter (Signed)
Called patient to follow up on [employer] PAP forms. No answer, left message.

## 2019-11-14 ENCOUNTER — Telehealth: Payer: Self-pay | Admitting: Acute Care

## 2019-11-14 NOTE — Telephone Encounter (Signed)
Nucala enrollment form placed at front desk with Patient parts high lighted. Nothing further at this time.

## 2019-11-16 ENCOUNTER — Encounter: Payer: Self-pay | Admitting: Family Medicine

## 2019-11-16 ENCOUNTER — Telehealth: Payer: Self-pay | Admitting: Internal Medicine

## 2019-11-16 ENCOUNTER — Other Ambulatory Visit: Payer: Self-pay

## 2019-11-16 ENCOUNTER — Ambulatory Visit: Payer: Medicare Other | Attending: Family Medicine | Admitting: Family Medicine

## 2019-11-16 DIAGNOSIS — J329 Chronic sinusitis, unspecified: Secondary | ICD-10-CM

## 2019-11-16 MED ORDER — PREDNISONE 20 MG PO TABS: 20.0000 mg | ORAL_TABLET | Freq: Every day | ORAL | 0 refills | Status: DC

## 2019-11-16 NOTE — Progress Notes (Signed)
States that he may have a cold.

## 2019-11-16 NOTE — Telephone Encounter (Signed)
Patient called to see if he could be worked in with his PCP or any other doctor for his asthma flareup with a cold.  Patient does not have a fever.  He also stated that he would do a virtual as well in order to get some medication.  Checked schedules and there was nothing available until August.  Please call patient back to see if he can be worked in [place].  CB# [phone]

## 2019-11-16 NOTE — Progress Notes (Signed)
Virtual Visit via Telephone Note  I connected with [employer], on 11/16/2019 at 1:59 PM by telephone due to the COVID-19 pandemic and verified that I am speaking with the correct person using two identifiers.   Consent: I discussed the limitations, risks, security and privacy concerns of performing an evaluation and management service by telephone and the availability of in person appointments. I also discussed with the patient that there may be a patient responsible charge related to this service. The patient expressed understanding and agreed to proceed.   Location of Patient: Home  Location of Provider: Clinic   Persons participating in Telemedicine visit: Laderrick Junior Eilene Ghazi Far[place]-CMA Dr. Margarita Rana     History of Present Illness: Christian [name] is a 59 year old male with a history of COPD/Asthma, type 2 diabetes mellitus, hyperlipidemia, hypertension, who presents today for an acute visit. For the last 4 days  he has chest tightness, nasal stuffiness and difficulty breathing. He has no sinus pressue, body aches, chills, denies sick contacts and is up to date on his COVID vaccine He has been coughing with production of yellow sputum.   Past Medical History:  Diagnosis Date  . Allergy   . Arthritis    "left knee" (01/22/2016)  . Asthma   . Chronic lower back pain   . COPD (chronic obstructive pulmonary disease) ([place])   . Dyspnea    with extertion  . High cholesterol   . Hypertension   . Neuropathy   . OSA on CPAP   . Secondhand smoke exposure   . Sleep apnea    wears CPAP, not wearing now  for months  . Type II diabetes mellitus ([place]) dx'd 01/17/2016   Allergies  Allergen Reactions  . Phenergan [Promethazine Hcl] Itching and Other (See Comments)    burning    Current Outpatient Medications on File Prior to Visit  Medication Sig Dispense Refill  . ACCU-CHEK GUIDE test strip USE 1  TEST STRIP ONCE DAILY TO CHECK BLOOD SUGAR 100 each 1  . albuterol  (VENTOLIN HFA) 108 (90 Base) MCG/ACT inhaler INHALE 1 TO 2 PUFFS BY MOUTH EVERY 6 HOURS AS NEEDED FOR WHEEZING FOR SHORTNESS OF BREATH 9 g 11  . amLODipine (NORVASC) 10 MG tablet Take 1 tablet (10 mg total) by mouth daily. 90 tablet 3  . Blood Glucose Monitoring Suppl (ONE TOUCH ULTRA 2) w/Device KIT Use as directed 3 times daily E11.9 1 each 0  . cetirizine (ZYRTEC) 10 MG tablet Take 1 tablet (10 mg total) by mouth daily. 30 tablet 11  . fluticasone (FLONASE) 50 MCG/ACT nasal spray Place 2 sprays into both nostrils daily. 16 g 6  . Fluticasone-Salmeterol (ADVAIR DISKUS) 500-50 MCG/DOSE AEPB Inhale 1 puff into the lungs 2 (two) times daily. 60 each 3  . furosemide (LASIX) 20 MG tablet Take 1 tablet (20 mg total) by mouth daily. X 1 week then prn swelling 30 tablet 0  . gabapentin (NEURONTIN) 300 MG capsule Take 1 capsule (300 mg total) by mouth 3 (three) times daily. 90 capsule 3  . ipratropium-albuterol (DUONEB) 0.5-2.5 (3) MG/3ML SOLN Take 3 mLs by nebulization every 4 (four) hours as needed. 360 mL 2  . lisinopril-hydrochlorothiazide (ZESTORETIC) 10-12.5 MG tablet Take 1 tablet by mouth daily. 90 tablet 2  . metFORMIN (GLUCOPHAGE) 500 MG tablet Take 1 tablet (500 mg total) by mouth every morning. 90 tablet 1  . montelukast (SINGULAIR) 10 MG tablet Take 1 tablet (10 mg total) by mouth at bedtime. [place]  tablet 1  . pravastatin (PRAVACHOL) 20 MG tablet Take 1 tablet (20 mg total) by mouth at bedtime. 90 tablet 0  . TRUEPLUS LANCETS 28G MISC Check glucose 2 times a day 100 each 3  . Mepolizumab (NUCALA) 100 MG SOLR Inject 100 mg into the skin every 28 (twenty-eight) days. (Patient not taking: Reported on 10/14/2019) 1 each 12  . predniSONE (DELTASONE) 10 MG tablet 3 tablets daily x1 days Po then, 2 tablets daily x2 days then 1 tablet daily x2 days (Patient not taking: Reported on 11/16/2019) 9 tablet 0  . Vitamin D, Ergocalciferol, (DRISDOL) 1.25 MG (50000 UT) CAPS capsule Take 1 capsule (50,000 Units  total) by mouth every 7 (seven) days. (Patient not taking: Reported on 10/18/2019) 16 capsule 0   No current facility-administered medications on file prior to visit.    Observations/Objective: Awake, alert, oriented x3 Not in acute distress  Assessment and Plan: 1. Sinusitis, unspecified chronicity, unspecified location Advised to perform nasal irrigation, continue with Flonase We will place on short course of steroid No indication for antibiotic at this time He knows to contact the clinic if symptoms persist an antibiotic will be initiated - predniSONE (DELTASONE) 20 MG tablet; Take 1 tablet (20 mg total) by mouth daily with breakfast.  Dispense: 5 tablet; Refill: 0   Follow Up Instructions: Keep previously scheduled appointment   I discussed the assessment and treatment plan with the patient. The patient was provided an opportunity to ask questions and all were answered. The patient agreed with the plan and demonstrated an understanding of the instructions.   The patient was advised to call back or seek an in-person evaluation if the symptoms worsen or if the condition fails to improve as anticipated.     I provided 11 minutes total of non-face-to-face time during this encounter including median intraservice time, reviewing previous notes, investigations, ordering medications, medical decision making, coordinating care and patient verbalized understanding at the end of the visit.     Charlott Rakes, MD, FAAFP. [facility] and [place], [place]   11/16/2019, 1:59 PM

## 2019-11-16 NOTE — Telephone Encounter (Signed)
Called patient and scheduled him an appt for this afternoon.

## 2019-11-17 NOTE — Telephone Encounter (Signed)
Per notes on 7/19, patient is to come to office to complete and sign Nucala enrollment/patient assistance forms.

## 2019-11-22 ENCOUNTER — Telehealth: Payer: Self-pay | Admitting: Acute Care

## 2019-11-22 NOTE — Telephone Encounter (Signed)
Can someone at the [facility] office please give this to pharm team? Thanks

## 2019-11-22 NOTE — Telephone Encounter (Signed)
Patient dropped off forms for [facility] [place]. Patient phone number is [phone].

## 2019-11-23 ENCOUNTER — Telehealth: Payer: Self-pay

## 2019-11-23 ENCOUNTER — Other Ambulatory Visit: Payer: Self-pay

## 2019-11-23 ENCOUNTER — Telehealth: Payer: Self-pay | Admitting: Internal Medicine

## 2019-11-23 ENCOUNTER — Ambulatory Visit: Payer: Medicare Other | Admitting: Physician Assistant

## 2019-11-23 DIAGNOSIS — Z79899 Other long term (current) drug therapy: Secondary | ICD-10-CM

## 2019-11-23 MED ORDER — AMOXICILLIN 500 MG PO CAPS
500.0000 mg | ORAL_CAPSULE | Freq: Three times a day (TID) | ORAL | 0 refills | Status: DC
Start: 2019-11-23 — End: 2019-12-15

## 2019-11-23 NOTE — Progress Notes (Signed)
Note opened in error, patient had medication requested filled by provider who saw him week earlier

## 2019-11-23 NOTE — Telephone Encounter (Signed)
Patient was called and informed of medication being sent to pharmacy. 

## 2019-11-23 NOTE — Telephone Encounter (Signed)
Patient was last seen 11/16/2019 by Dr. Margarita Rana for sinusitis, patient states he received the prednisone but was advised an antibiotic would be sent to local pharmacy, patient states congestion has not improved, seeking a follow up call today   [place], [place]. Phone:  [phone]  Fax:  [phone]

## 2019-11-23 NOTE — Telephone Encounter (Signed)
Done

## 2019-11-23 NOTE — Telephone Encounter (Signed)
Sent to wrong office.    Copied from [place] [id number]. Topic: Appointment Scheduling - Scheduling Inquiry for Clinic >> Nov 23, 2019  9:08 AM Oneta Rack wrote: Reason for CRM: patient seeking acute appointment for today practice has no availability,  please follow back up and advise the mobile clinic. Will also send a clinical note to the nurse that the patient is seeking clinical advice

## 2019-11-23 NOTE — Telephone Encounter (Signed)
Patient is requesting antibiotics for his congestion, he states that it is not getting better.

## 2019-11-23 NOTE — Telephone Encounter (Signed)
  As instructed by NP/MD called pt relay MD message . Pt agreed to walk in to the Mobile Unit !Location and hours of operation given .

## 2019-11-23 NOTE — Telephone Encounter (Signed)
Pharmacy team received patient signed documents, will finish filling in information and obtain provider signature for submission.

## 2019-11-24 NOTE — Telephone Encounter (Signed)
Faxed documents to [facility] [place]. Will update once we receive a response.

## 2019-11-24 NOTE — Telephone Encounter (Signed)
Updated will be in previous encounter. Closing.

## 2019-11-28 NOTE — Telephone Encounter (Signed)
Received a fax from  [place] to [place] regarding an approval for [employer] syringe patient assistance from 11/28/19 to 04/27/20.   Patient ID# [id number] Phone number: [phone]

## 2019-11-30 DIAGNOSIS — G4733 Obstructive sleep apnea (adult) (pediatric): Secondary | ICD-10-CM | POA: Diagnosis not present

## 2019-12-01 ENCOUNTER — Emergency Department (HOSPITAL_COMMUNITY): Payer: Medicare Other

## 2019-12-01 ENCOUNTER — Other Ambulatory Visit: Payer: Self-pay

## 2019-12-01 ENCOUNTER — Observation Stay (HOSPITAL_COMMUNITY)
Admission: EM | Admit: 2019-12-01 | Discharge: 2019-12-03 | Disposition: A | Discharge status: Home / Self Care | Payer: Medicare Other | Attending: Internal Medicine | Admitting: Internal Medicine

## 2019-12-01 DIAGNOSIS — E119 Type 2 diabetes mellitus without complications: Secondary | ICD-10-CM | POA: Insufficient documentation

## 2019-12-01 DIAGNOSIS — R109 Unspecified abdominal pain: Secondary | ICD-10-CM

## 2019-12-01 DIAGNOSIS — J449 Chronic obstructive pulmonary disease, unspecified: Secondary | ICD-10-CM | POA: Diagnosis not present

## 2019-12-01 DIAGNOSIS — R1012 Left upper quadrant pain: Secondary | ICD-10-CM | POA: Insufficient documentation

## 2019-12-01 DIAGNOSIS — Z20822 Contact with and (suspected) exposure to covid-19: Secondary | ICD-10-CM | POA: Diagnosis not present

## 2019-12-01 DIAGNOSIS — E1140 Type 2 diabetes mellitus with diabetic neuropathy, unspecified: Secondary | ICD-10-CM | POA: Diagnosis not present

## 2019-12-01 DIAGNOSIS — I10 Essential (primary) hypertension: Secondary | ICD-10-CM | POA: Diagnosis not present

## 2019-12-01 DIAGNOSIS — J8283 Eosinophilic asthma: Secondary | ICD-10-CM | POA: Diagnosis not present

## 2019-12-01 DIAGNOSIS — Z7984 Long term (current) use of oral hypoglycemic drugs: Secondary | ICD-10-CM | POA: Diagnosis not present

## 2019-12-01 DIAGNOSIS — R0789 Other chest pain: Secondary | ICD-10-CM | POA: Diagnosis not present

## 2019-12-01 DIAGNOSIS — Z79899 Other long term (current) drug therapy: Secondary | ICD-10-CM | POA: Diagnosis not present

## 2019-12-01 DIAGNOSIS — Z96652 Presence of left artificial knee joint: Secondary | ICD-10-CM | POA: Insufficient documentation

## 2019-12-01 DIAGNOSIS — J441 Chronic obstructive pulmonary disease with (acute) exacerbation: Secondary | ICD-10-CM

## 2019-12-01 DIAGNOSIS — J4551 Severe persistent asthma with (acute) exacerbation: Secondary | ICD-10-CM | POA: Diagnosis not present

## 2019-12-01 DIAGNOSIS — Z7722 Contact with and (suspected) exposure to environmental tobacco smoke (acute) (chronic): Secondary | ICD-10-CM | POA: Insufficient documentation

## 2019-12-01 DIAGNOSIS — R0600 Dyspnea, unspecified: Secondary | ICD-10-CM | POA: Diagnosis not present

## 2019-12-01 DIAGNOSIS — R069 Unspecified abnormalities of breathing: Secondary | ICD-10-CM | POA: Diagnosis not present

## 2019-12-01 DIAGNOSIS — R0602 Shortness of breath: Secondary | ICD-10-CM | POA: Diagnosis not present

## 2019-12-01 DIAGNOSIS — R0689 Other abnormalities of breathing: Secondary | ICD-10-CM | POA: Diagnosis not present

## 2019-12-01 DIAGNOSIS — R079 Chest pain, unspecified: Secondary | ICD-10-CM | POA: Diagnosis not present

## 2019-12-01 LAB — SARS CORONAVIRUS 2 BY RT PCR (HOSPITAL ORDER, PERFORMED IN ~~LOC~~ HOSPITAL LAB): SARS Coronavirus 2: NEGATIVE

## 2019-12-01 LAB — CBC WITH DIFFERENTIAL/PLATELET
Abs Immature Granulocytes: 0.02 10*3/uL (ref 0.00–0.07)
Basophils Absolute: 0.1 10*3/uL (ref 0.0–0.1)
Basophils Relative: 2 %
Eosinophils Absolute: 0.7 10*3/uL — ABNORMAL HIGH (ref 0.0–0.5)
Eosinophils Relative: 12 %
HCT: 48 % (ref 39.0–52.0)
Hemoglobin: 15.6 g/dL (ref 13.0–17.0)
Immature Granulocytes: 0 %
Lymphocytes Relative: 30 %
Lymphs Abs: 1.7 10*3/uL (ref 0.7–4.0)
MCH: 29.2 pg (ref 26.0–34.0)
MCHC: 32.5 g/dL (ref 30.0–36.0)
MCV: 89.9 fL (ref 80.0–100.0)
Monocytes Absolute: 0.6 10*3/uL (ref 0.1–1.0)
Monocytes Relative: 11 %
Neutro Abs: 2.6 10*3/uL (ref 1.7–7.7)
Neutrophils Relative %: 45 %
Platelets: 246 10*3/uL (ref 150–400)
RBC: 5.34 MIL/uL (ref 4.22–5.81)
RDW: 14.1 % (ref 11.5–15.5)
WBC: 5.8 10*3/uL (ref 4.0–10.5)
nRBC: 0 % (ref 0.0–0.2)

## 2019-12-01 LAB — BASIC METABOLIC PANEL
Anion gap: 8 (ref 5–15)
BUN: 9 mg/dL (ref 6–20)
CO2: 27 mmol/L (ref 22–32)
Calcium: 8.8 mg/dL — ABNORMAL LOW (ref 8.9–10.3)
Chloride: 104 mmol/L (ref 98–111)
Creatinine, Ser: 1.31 mg/dL — ABNORMAL HIGH (ref 0.61–1.24)
GFR calc Af Amer: >60 mL/min (ref >60)
GFR calc non Af Amer: 59 mL/min — ABNORMAL LOW (ref >60)
Glucose, Bld: 120 mg/dL — ABNORMAL HIGH (ref 70–99)
Potassium: 3.9 mmol/L (ref 3.5–5.1)
Sodium: 139 mmol/L (ref 135–145)

## 2019-12-01 LAB — CBG MONITORING, ED: Glucose-Capillary: 157 mg/dL — ABNORMAL HIGH (ref 70–99)

## 2019-12-01 MED ORDER — AMLODIPINE BESYLATE 10 MG PO TABS
10.0000 mg | ORAL_TABLET | Freq: Every day | ORAL | Status: DC
  Administered 2019-12-02 – 2019-12-03 (×2): 10 mg via ORAL
  Filled 2019-12-01 (×2): qty 1

## 2019-12-01 MED ORDER — INSULIN ASPART 100 UNIT/ML ~~LOC~~ SOLN
0.0000 [IU] | Freq: Three times a day (TID) | SUBCUTANEOUS | Status: DC
  Administered 2019-12-02: 3 [IU] via SUBCUTANEOUS
  Administered 2019-12-02: 2 [IU] via SUBCUTANEOUS
  Administered 2019-12-02: 5 [IU] via SUBCUTANEOUS
  Administered 2019-12-03 (×2): 3 [IU] via SUBCUTANEOUS

## 2019-12-01 MED ORDER — AMOXICILLIN 500 MG PO CAPS
500.0000 mg | ORAL_CAPSULE | Freq: Three times a day (TID) | ORAL | Status: DC
  Administered 2019-12-01 – 2019-12-03 (×6): 500 mg via ORAL
  Filled 2019-12-01 (×7): qty 1

## 2019-12-01 MED ORDER — IPRATROPIUM-ALBUTEROL 0.5-2.5 (3) MG/3ML IN SOLN
3.0000 mL | Freq: Once | RESPIRATORY_TRACT | Status: AC
  Administered 2019-12-01: 3 mL via RESPIRATORY_TRACT
  Filled 2019-12-01: qty 3

## 2019-12-01 MED ORDER — PREDNISONE 20 MG PO TABS
40.0000 mg | ORAL_TABLET | Freq: Every day | ORAL | Status: DC
  Administered 2019-12-02 – 2019-12-03 (×2): 40 mg via ORAL
  Filled 2019-12-01 (×2): qty 2

## 2019-12-01 MED ORDER — GABAPENTIN 300 MG PO CAPS
300.0000 mg | ORAL_CAPSULE | Freq: Three times a day (TID) | ORAL | Status: DC
  Administered 2019-12-01 – 2019-12-03 (×6): 300 mg via ORAL
  Filled 2019-12-01 (×6): qty 1

## 2019-12-01 MED ORDER — MOMETASONE FURO-FORMOTEROL FUM 200-5 MCG/ACT IN AERO
1.0000 | INHALATION_SPRAY | Freq: Two times a day (BID) | RESPIRATORY_TRACT | Status: DC
  Administered 2019-12-02 – 2019-12-03 (×3): 1 via RESPIRATORY_TRACT
  Filled 2019-12-01: qty 8.8

## 2019-12-01 MED ORDER — ENOXAPARIN SODIUM 40 MG/0.4ML ~~LOC~~ SOLN
40.0000 mg | SUBCUTANEOUS | Status: DC
  Administered 2019-12-01 – 2019-12-02 (×2): 40 mg via SUBCUTANEOUS
  Filled 2019-12-01 (×2): qty 0.4

## 2019-12-01 MED ORDER — INSULIN ASPART 100 UNIT/ML ~~LOC~~ SOLN: 0.0000 [IU] | Freq: Every day | SUBCUTANEOUS | Status: DC

## 2019-12-01 MED ORDER — LISINOPRIL-HYDROCHLOROTHIAZIDE 10-12.5 MG PO TABS: 1.0000 | ORAL_TABLET | Freq: Every day | ORAL | Status: DC

## 2019-12-01 MED ORDER — LISINOPRIL 10 MG PO TABS
10.0000 mg | ORAL_TABLET | Freq: Every day | ORAL | Status: DC
  Administered 2019-12-02 – 2019-12-03 (×2): 10 mg via ORAL
  Filled 2019-12-01 (×2): qty 1

## 2019-12-01 MED ORDER — ALBUTEROL SULFATE (2.5 MG/3ML) 0.083% IN NEBU: 2.5000 mg | INHALATION_SOLUTION | RESPIRATORY_TRACT | Status: DC | PRN

## 2019-12-01 MED ORDER — UMECLIDINIUM BROMIDE 62.5 MCG/INH IN AEPB
1.0000 | INHALATION_SPRAY | Freq: Every day | RESPIRATORY_TRACT | Status: DC
  Administered 2019-12-02 – 2019-12-03 (×2): 1 via RESPIRATORY_TRACT
  Filled 2019-12-01: qty 7

## 2019-12-01 MED ORDER — IPRATROPIUM-ALBUTEROL 0.5-2.5 (3) MG/3ML IN SOLN
3.0000 mL | RESPIRATORY_TRACT | Status: DC | PRN
  Administered 2019-12-02 (×2): 3 mL via RESPIRATORY_TRACT
  Filled 2019-12-01 (×2): qty 3

## 2019-12-01 MED ORDER — ALBUTEROL SULFATE HFA 108 (90 BASE) MCG/ACT IN AERS
4.0000 | INHALATION_SPRAY | Freq: Once | RESPIRATORY_TRACT | Status: AC
  Administered 2019-12-01: 4 via RESPIRATORY_TRACT

## 2019-12-01 MED ORDER — HYDROCHLOROTHIAZIDE 12.5 MG PO CAPS
12.5000 mg | ORAL_CAPSULE | Freq: Every day | ORAL | Status: DC
  Administered 2019-12-02 – 2019-12-03 (×2): 12.5 mg via ORAL
  Filled 2019-12-01 (×2): qty 1

## 2019-12-01 MED ORDER — PRAVASTATIN SODIUM 10 MG PO TABS
20.0000 mg | ORAL_TABLET | Freq: Every day | ORAL | Status: DC
  Administered 2019-12-01 – 2019-12-02 (×2): 20 mg via ORAL
  Filled 2019-12-01 (×2): qty 2

## 2019-12-01 MED ORDER — MONTELUKAST SODIUM 10 MG PO TABS
10.0000 mg | ORAL_TABLET | Freq: Every day | ORAL | Status: DC
  Administered 2019-12-01 – 2019-12-02 (×2): 10 mg via ORAL
  Filled 2019-12-01 (×3): qty 1

## 2019-12-01 NOTE — H&P (Signed)
History and Physical    [employer] [facility] XFG:[id number] DOB: Sep 07, 1960 DOA: 12/01/2019  PCP: Ladell Pier, MD  Patient coming from: Home  I have personally briefly reviewed patient's old medical records in [place]  Chief Complaint: Asthma exacerbation  HPI: Christian [name] is a 59 y.o. male with medical history significant of COPD/eosinophillic asthma, never smoker, OSA not compliant with CPAP, DM2, HTN.  Pt presents to ED with acute worsening of breathing.  Breathing progressively worsening "for months" but acutely worsened for past couple of days.  Looks like PCP started him on ABx.  Symptoms are severe, persistent, nothing makes better or worse.  Ran out of Advair last month apparently.  EMS gave 2g mg, duoneb, 125 solumedrol en route to ED.   ED Course: Wheezing persists.  CXR neg.  Hospitalist asked to admit.   Review of Systems: As per HPI, otherwise all review of systems negative.  Past Medical History:  Diagnosis Date  . Allergy   . Arthritis    "left knee" (01/22/2016)  . Asthma   . Chronic lower back pain   . COPD (chronic obstructive pulmonary disease) ([place])   . Dyspnea    with extertion  . High cholesterol   . Hypertension   . Neuropathy   . OSA on CPAP   . Secondhand smoke exposure   . Sleep apnea    wears CPAP, not wearing now  for months  . Type II diabetes mellitus ([place]) dx'd 01/17/2016    Past Surgical History:  Procedure Laterality Date  . ANTERIOR CERVICAL DECOMP/DISCECTOMY FUSION  ~ 1995   "titanium"  . APPENDECTOMY  ~ 1977  . BACK SURGERY     x 2  L 4disectomy  . COLONOSCOPY W/ POLYPECTOMY    . INGUINAL HERNIA REPAIR Right ~ 2000  . LUMBAR DISC SURGERY  ~ 2000; ~2006  . NASAL SINUS SURGERY  ~ 2007  . TOTAL KNEE ARTHROPLASTY Left 08/06/2017   Procedure: LEFT TOTAL KNEE ARTHROPLASTY;  Surgeon: Leandrew Koyanagi, MD;  Location: [facility];  Service: Orthopedics;  Laterality: Left;     reports that he is a non-smoker but has  been exposed to tobacco smoke. He has never used smokeless tobacco. He reports that he does not drink alcohol and does not use drugs.  Allergies  Allergen Reactions  . Phenergan [Promethazine Hcl] Itching and Other (See Comments)    burning    Family History  Problem Relation Age of Onset  . Hypertension Mother   . Hypertension Father   . Heart Problems Brother        Heart transplant, pt does not know cause   . Rectal cancer Brother   . Colon cancer Neg Hx   . Esophageal cancer Neg Hx   . Stomach cancer Neg Hx      Prior to Admission medications   Medication Sig Start Date End Date Taking? Authorizing Provider  albuterol (VENTOLIN HFA) 108 (90 Base) MCG/ACT inhaler INHALE 1 TO 2 PUFFS BY MOUTH EVERY 6 HOURS AS NEEDED FOR WHEEZING FOR SHORTNESS OF BREATH Patient taking differently: Inhale 1-2 puffs into the lungs every 6 (six) hours as needed for wheezing or shortness of breath.  10/18/19  Yes Ladell Pier, MD  amLODipine (NORVASC) 10 MG tablet Take 1 tablet (10 mg total) by mouth daily. 10/18/19  Yes Ladell Pier, MD  amoxicillin (AMOXIL) 500 MG capsule Take 1 capsule (500 mg total) by mouth 3 (three) times daily. 11/23/19  Yes Charlott Rakes, MD  cetirizine (ZYRTEC) 10 MG tablet Take 1 tablet (10 mg total) by mouth daily. 10/14/19  Yes Magdalen Spatz, NP  furosemide (LASIX) 20 MG tablet Take 1 tablet (20 mg total) by mouth daily. X 1 week then prn swelling Patient taking differently: Take 20 mg by mouth daily as needed for fluid.  10/18/19  Yes Ladell Pier, MD  gabapentin (NEURONTIN) 300 MG capsule Take 1 capsule (300 mg total) by mouth 3 (three) times daily. 10/18/19  Yes Ladell Pier, MD  ipratropium-albuterol (DUONEB) 0.5-2.5 (3) MG/3ML SOLN Take 3 mLs by nebulization every 4 (four) hours as needed. Patient taking differently: Take 3 mLs by nebulization every 4 (four) hours as needed (for breathing).  10/14/19  Yes Magdalen Spatz, NP    lisinopril-hydrochlorothiazide (ZESTORETIC) 10-12.5 MG tablet Take 1 tablet by mouth daily. 10/18/19  Yes Ladell Pier, MD  metFORMIN (GLUCOPHAGE) 500 MG tablet Take 1 tablet (500 mg total) by mouth every morning. Patient taking differently: Take 500 mg by mouth daily with breakfast.  10/18/19  Yes Ladell Pier, MD  montelukast (SINGULAIR) 10 MG tablet Take 1 tablet (10 mg total) by mouth at bedtime. 10/18/19  Yes Ladell Pier, MD  pravastatin (PRAVACHOL) 20 MG tablet Take 1 tablet (20 mg total) by mouth at bedtime. 10/18/19  Yes Ladell Pier, MD  ACCU-CHEK GUIDE test strip USE 1  TEST STRIP ONCE DAILY TO CHECK BLOOD SUGAR 08/26/19   Ladell Pier, MD  Blood Glucose Monitoring Suppl (ONE TOUCH ULTRA 2) w/Device KIT Use as directed 3 times daily E11.9 03/31/17   Ladell Pier, MD  Fluticasone-Salmeterol (ADVAIR DISKUS) 500-50 MCG/DOSE AEPB Inhale 1 puff into the lungs 2 (two) times daily. Patient not taking: Reported on 12/01/2019 10/18/19   Ladell Pier, MD  Mepolizumab (NUCALA) 100 MG SOLR Inject 100 mg into the skin every 28 (twenty-eight) days. Patient not taking: Reported on 10/14/2019 07/08/19   Laurin Coder, MD  TRUEPLUS LANCETS 28G MISC Check glucose 2 times a day 02/01/16   Maren Reamer, MD    Physical Exam: Vitals:   12/01/19 1937 12/01/19 1938 12/01/19 2045 12/01/19 2200  BP:   113/75 111/75  Pulse:   73 72  Resp:   13 14  Temp:      TempSrc:      SpO2: 95%  94% 90%  Weight:  88 kg    Height:  5' 10"  (1.778 m)      Constitutional: NAD, calm, comfortable Eyes: PERRL, lids and conjunctivae normal ENMT: Mucous membranes are moist. Posterior pharynx clear of any exudate or lesions.Normal dentition.  Neck: normal, supple, no masses, no thyromegaly Respiratory: Diffuse wheezing Cardiovascular: Regular rate and rhythm, no murmurs / rubs / gallops. No extremity edema. 2+ pedal pulses. No carotid bruits.  Abdomen: no tenderness, no masses  palpated. No hepatosplenomegaly. Bowel sounds positive.  Musculoskeletal: no clubbing / cyanosis. No joint deformity upper and lower extremities. Good ROM, no contractures. Normal muscle tone.  Skin: no rashes, lesions, ulcers. No induration Neurologic: CN 2-12 grossly intact. Sensation intact, DTR normal. Strength 5/5 in all 4.  Psychiatric: Normal judgment and insight. Alert and oriented x 3. Normal mood.    Labs on Admission: I have personally reviewed following labs and imaging studies  CBC: Recent Labs  Lab 12/01/19 1943  WBC 5.8  NEUTROABS 2.6  HGB 15.6  HCT 48.0  MCV 89.9  PLT 246   Basic  Metabolic Panel: Recent Labs  Lab 12/01/19 1943  NA 139  K 3.9  CL 104  CO2 27  GLUCOSE 120*  BUN 9  CREATININE 1.31*  CALCIUM 8.8*   GFR: Estimated Creatinine Clearance: 67.8 mL/min (A) (by C-G formula based on SCr of 1.31 mg/dL (H)). Liver Function Tests: No results for input(s): AST, ALT, ALKPHOS, BILITOT, PROT, ALBUMIN in the last 168 hours. No results for input(s): LIPASE, AMYLASE in the last 168 hours. No results for input(s): AMMONIA in the last 168 hours. Coagulation Profile: No results for input(s): INR, PROTIME in the last 168 hours. Cardiac Enzymes: No results for input(s): CKTOTAL, CKMB, CKMBINDEX, TROPONINI in the last 168 hours. BNP (last 3 results) No results for input(s): PROBNP in the last 8760 hours. HbA1C: No results for input(s): HGBA1C in the last 72 hours. CBG: No results for input(s): GLUCAP in the last 168 hours. Lipid Profile: No results for input(s): CHOL, HDL, LDLCALC, TRIG, CHOLHDL, LDLDIRECT in the last 72 hours. Thyroid Function Tests: No results for input(s): TSH, T4TOTAL, FREET4, T3FREE, THYROIDAB in the last 72 hours. Anemia Panel: No results for input(s): VITAMINB12, FOLATE, FERRITIN, TIBC, IRON, RETICCTPCT in the last 72 hours. Urine analysis:    Component Value Date/Time   BILIRUBINUR negative 07/16/2016 1158   PROTEINUR negatuve  07/16/2016 1158   UROBILINOGEN 0.2 07/16/2016 1158   NITRITE negatuive 07/16/2016 1158   LEUKOCYTESUR Negative 07/16/2016 1158    Radiological Exams on Admission: DG Chest Portable 1 View  Result Date: 12/01/2019 CLINICAL DATA:  Dyspnea.  History of COPD. EXAM: PORTABLE CHEST 1 VIEW COMPARISON:  September 29, 2019 FINDINGS: The heart size and mediastinal contours are within normal limits. Both lungs are clear. The visualized skeletal structures are unremarkable. IMPRESSION: No active disease. Electronically Signed   By: Constance Holster M.D.   On: 12/01/2019 20:16    EKG: Independently reviewed.  Assessment/Plan Principal Problem:   Severe persistent asthma with acute exacerbation Active Problems:   Essential hypertension   Diabetes mellitus with neuropathy (HCC)   Eosinophilic asthma    1. Severe persistent asthma with acute exacerbation - 1. COPD pathway 2. Prednisone daily 3. Adult wheeze protocol 4. PRN SABA 5. Scheduled LABA / inh steroid 6. Scheduled LAMA 7. Cont pulse ox 2. DM2 - 1. Hold home metformin 2. Mod scale SSI AC/HS 3. HTN - 1. Cont home BP meds  DVT prophylaxis: Lovenox Code Status: Full Family Communication: No family in room Disposition Plan: Home after breathing improved Consults called: None Admission status: Place in 28   Jimmylee Ratterree, [place] [facility]  How to contact the [facility] Attending or Consulting provider [place] or covering provider during after hours [place], for this patient?  1. Check the care team in [facility] and look for a) attending/consulting [facility] provider listed and b) the [facility] team listed 2. Log into [email or web address]  Amion Physician Scheduling and messaging for groups and whole hospitals  On call and physician scheduling software for group practices, residents, hospitalists and other medical providers for call, clinic, rotation and shift schedules. OnCall Enterprise is a hospital-wide system for scheduling doctors and paging doctors on  call. EasyPlot is for scientific plotting and data analysis.  [email or web address]  and use [place]'s universal password to access. If you do not have the password, please contact the hospital operator.  3. Locate the [facility] provider you are looking for under [facility] and page to a number that you can be directly reached. 4. If  you still have difficulty reaching the provider, please page the [facility] (Director on Call) for the Hospitalists listed on amion for assistance.  12/01/2019, 10:45 PM

## 2019-12-01 NOTE — ED Provider Notes (Signed)
[facility] EMERGENCY DEPARTMENT Provider Note   CSN: [id number] Arrival date & time: 12/01/19  2725     History Chief Complaint  Patient presents with  . Shortness of Breath    Christian [name] is a 59 y.o. male.  HPI   68yM with dyspnea. Hx of COPD. Worsening shortness of breath over the past week. Occasional productive cough. No fever. Feels very fatigued. He thinks this is probably because he has been sleeping very poorly. No unusual leg pain or swelling. O2 sats low 90s for EMS. Received 125 mg solumedrol, 2 mg magnesium and albuterol/atrovent by EMS. Still feels SOB but improved.   Past Medical History:  Diagnosis Date  . Allergy   . Arthritis    "left knee" (01/22/2016)  . Asthma   . Chronic lower back pain   . COPD (chronic obstructive pulmonary disease) ([name])   . Dyspnea    with extertion  . High cholesterol   . Hypertension   . Neuropathy   . OSA on CPAP   . Secondhand smoke exposure   . Sleep apnea    wears CPAP, not wearing now  for months  . Type II diabetes mellitus ([name]) dx'd 01/17/2016    Patient Active Problem List   Diagnosis Date Noted  . Medication management 07/21/2019  . Nasal polyps 06/21/2019  . Allergic rhinitis 04/05/2019  . OSA (obstructive sleep apnea) 04/05/2019  . Presence of left artificial knee joint 08/24/2017  . Eosinophilic asthma 36/64/4034  . Primary osteoarthritis of left knee 02/23/2017  . Chronic pain syndrome 04/02/2016  . Intrinsic asthma 03/25/2016  . Essential hypertension 01/23/2016  . Hyperlipidemia 01/23/2016  . Secondhand smoke exposure 01/23/2016  . Diabetes mellitus with neuropathy ([place]) 01/23/2016    Past Surgical History:  Procedure Laterality Date  . ANTERIOR CERVICAL DECOMP/DISCECTOMY FUSION  ~ 1995   "titanium"  . APPENDECTOMY  ~ 1977  . BACK SURGERY     x 2  L 4disectomy  . COLONOSCOPY W/ POLYPECTOMY    . INGUINAL HERNIA REPAIR Right ~ 2000  . LUMBAR DISC SURGERY  ~ 2000; ~2006    . NASAL SINUS SURGERY  ~ 2007  . TOTAL KNEE ARTHROPLASTY Left 08/06/2017   Procedure: LEFT TOTAL KNEE ARTHROPLASTY;  Surgeon: Leandrew Koyanagi, MD;  Location: [facility];  Service: Orthopedics;  Laterality: Left;       Family History  Problem Relation Age of Onset  . Hypertension Mother   . Hypertension Father   . Heart Problems Brother        Heart transplant, pt does not know cause   . Rectal cancer Brother   . Colon cancer Neg Hx   . Esophageal cancer Neg Hx   . Stomach cancer Neg Hx     Social History   Tobacco Use  . Smoking status: Passive Smoke Exposure - Never Smoker  . Smokeless tobacco: Never Used  . Tobacco comment: passive cig smoking at work  1994- 2012  Vaping Use  . Vaping Use: Never used  Substance Use Topics  . Alcohol use: No  . Drug use: No    Home Medications Prior to Admission medications   Medication Sig Start Date End Date Taking? Authorizing Provider  ACCU-CHEK GUIDE test strip USE 1  TEST STRIP ONCE DAILY TO CHECK BLOOD SUGAR 08/26/19   Ladell Pier, MD  albuterol (VENTOLIN HFA) 108 (90 Base) MCG/ACT inhaler INHALE 1 TO 2 PUFFS BY MOUTH EVERY 6 HOURS AS NEEDED  FOR WHEEZING FOR SHORTNESS OF BREATH 10/18/19   Ladell Pier, MD  amLODipine (NORVASC) 10 MG tablet Take 1 tablet (10 mg total) by mouth daily. 10/18/19   Ladell Pier, MD  amoxicillin (AMOXIL) 500 MG capsule Take 1 capsule (500 mg total) by mouth 3 (three) times daily. 11/23/19   Charlott Rakes, MD  Blood Glucose Monitoring Suppl (ONE TOUCH ULTRA 2) w/Device KIT Use as directed 3 times daily E11.9 03/31/17   Ladell Pier, MD  cetirizine (ZYRTEC) 10 MG tablet Take 1 tablet (10 mg total) by mouth daily. 10/14/19   Magdalen Spatz, NP  fluticasone (FLONASE) 50 MCG/ACT nasal spray Place 2 sprays into both nostrils daily. 10/14/19   Magdalen Spatz, NP  Fluticasone-Salmeterol (ADVAIR DISKUS) 500-50 MCG/DOSE AEPB Inhale 1 puff into the lungs 2 (two) times daily. 10/18/19   Ladell Pier, MD  furosemide (LASIX) 20 MG tablet Take 1 tablet (20 mg total) by mouth daily. X 1 week then prn swelling 10/18/19   Ladell Pier, MD  gabapentin (NEURONTIN) 300 MG capsule Take 1 capsule (300 mg total) by mouth 3 (three) times daily. 10/18/19   Ladell Pier, MD  ipratropium-albuterol (DUONEB) 0.5-2.5 (3) MG/3ML SOLN Take 3 mLs by nebulization every 4 (four) hours as needed. 10/14/19   Magdalen Spatz, NP  lisinopril-hydrochlorothiazide (ZESTORETIC) 10-12.5 MG tablet Take 1 tablet by mouth daily. 10/18/19   Ladell Pier, MD  Mepolizumab (NUCALA) 100 MG SOLR Inject 100 mg into the skin every 28 (twenty-eight) days. Patient not taking: Reported on 10/14/2019 07/08/19   Laurin Coder, MD  metFORMIN (GLUCOPHAGE) 500 MG tablet Take 1 tablet (500 mg total) by mouth every morning. 10/18/19   Ladell Pier, MD  montelukast (SINGULAIR) 10 MG tablet Take 1 tablet (10 mg total) by mouth at bedtime. 10/18/19   Ladell Pier, MD  pravastatin (PRAVACHOL) 20 MG tablet Take 1 tablet (20 mg total) by mouth at bedtime. 10/18/19   Ladell Pier, MD  predniSONE (DELTASONE) 20 MG tablet Take 1 tablet (20 mg total) by mouth daily with breakfast. 11/16/19   Charlott Rakes, MD  TRUEPLUS LANCETS 28G MISC Check glucose 2 times a day 02/01/16   Lottie Mussel T, MD  Vitamin D, Ergocalciferol, (DRISDOL) 1.25 MG (50000 UT) CAPS capsule Take 1 capsule (50,000 Units total) by mouth every 7 (seven) days. Patient not taking: Reported on 10/18/2019 08/23/18   Argentina Donovan, PA-C    Allergies    Phenergan [promethazine hcl]  Review of Systems   Review of Systems All systems reviewed and negative, other than as noted in HPI.  Physical Exam Updated Vital Signs BP (!) 143/76 (BP Location: Right Arm)   Pulse 72   Temp 98.3 F (36.8 C) (Oral)   Resp 17   Ht 5' 10"  (1.778 m)   Wt 88 kg   SpO2 95%   BMI 27.84 kg/m   Physical Exam Vitals and nursing note reviewed.  Constitutional:       General: He is not in acute distress.    Appearance: He is well-developed.  HENT:     Head: Normocephalic and atraumatic.  Eyes:     General:        Right eye: No discharge.        Left eye: No discharge.     Conjunctiva/sclera: Conjunctivae normal.  Cardiovascular:     Rate and Rhythm: Normal rate and regular rhythm.     Heart  sounds: Normal heart sounds. No murmur heard.  No friction rub. No gallop.   Pulmonary:     Effort: Pulmonary effort is normal.     Breath sounds: Wheezing present.     Comments: Mild tachypnea. No accessory muscle usage. Diffuse wheezing.  Abdominal:     General: There is no distension.     Palpations: Abdomen is soft.     Tenderness: There is no abdominal tenderness.  Musculoskeletal:        General: No tenderness.     Cervical back: Neck supple.  Skin:    General: Skin is warm and dry.  Neurological:     Mental Status: He is alert.  Psychiatric:        Behavior: Behavior normal.        Thought Content: Thought content normal.     ED Results / Procedures / Treatments   Labs (all labs ordered are listed, but only abnormal results are displayed) Labs Reviewed  CBC WITH DIFFERENTIAL/PLATELET - Abnormal; Notable for the following components:      Result Value   Eosinophils Absolute 0.7 (*)    All other components within normal limits  BASIC METABOLIC PANEL - Abnormal; Notable for the following components:   Glucose, Bld 120 (*)    Creatinine, Ser 1.31 (*)    Calcium 8.8 (*)    GFR calc non Af Amer 59 (*)    All other components within normal limits  SARS CORONAVIRUS 2 BY RT PCR [facility] ORDER, [place] [facility])    EKG EKG Interpretation  Date/Time:  Thursday December 01 2019 19:34:12 EDT Ventricular Rate:  77 PR Interval:    QRS Duration: 81 QT Interval:  303 QTC Calculation: 343 R Axis:   81 Text Interpretation: Sinus rhythm Anterior infarct, old Confirmed by Virgel Manifold [phone]) on 12/01/2019 8:01:10  PM   Radiology No results found.  Procedures Procedures (including critical care time)  Medications Ordered in ED Medications  albuterol (VENTOLIN HFA) 108 (90 Base) MCG/ACT inhaler 4 puff (4 puffs Inhalation Given 12/01/19 1957)    ED Course  I have reviewed the triage vital signs and the nursing notes.  Pertinent labs & imaging results that were available during my care of the patient were reviewed by me and considered in my medical decision making (see chart for details).    MDM Rules/Calculators/A&P                          70yM with likely COPD exacerbation. Improved with bronchodilators. Wheezing on exam. I think infection less likely. Doesn't seem volume overloaded. Will continue albuterol. CXR. Labs/COVID testing.   Continues to remain pretty symptomatic. O2 sat 89% o RA when ambulating. No baseline oxygen requirement. Will admit.   Final Clinical Impression(s) / ED Diagnoses Final diagnoses:  COPD exacerbation [facility])    Rx / DC Orders ED Discharge Orders    None       Virgel Manifold, MD 12/05/19 0006

## 2019-12-01 NOTE — ED Notes (Signed)
Pt ambulated in the hallway, O2 sats sustained 88-89% on RA. MD notified

## 2019-12-01 NOTE — ED Triage Notes (Addendum)
Pt here via EMS for eval of SHOB, COPD exac. Pt has hx of COPD, asthma. Pt does not wear O2 at home, currently on 3L [place] due to O2 sats dropping to low 90's. O2 sats 99% room air on arrival. Pt given 125 solumedrol, 2mg  mag, and duoneb en route. Pt states he's felt weak & felt like he's been "getting worse" for the last couple of days, denies any additional complaints. Pt has been vaccinated for COVID. Lung sounds wheezy bilaterally, improved from diminished per EMS.

## 2019-12-02 ENCOUNTER — Encounter (HOSPITAL_COMMUNITY): Payer: Self-pay | Admitting: Internal Medicine

## 2019-12-02 DIAGNOSIS — J4540 Moderate persistent asthma, uncomplicated: Secondary | ICD-10-CM | POA: Diagnosis not present

## 2019-12-02 DIAGNOSIS — J4551 Severe persistent asthma with (acute) exacerbation: Secondary | ICD-10-CM | POA: Diagnosis not present

## 2019-12-02 LAB — CBC
HCT: 44.4 % (ref 39.0–52.0)
Hemoglobin: 14.5 g/dL (ref 13.0–17.0)
MCH: 29.1 pg (ref 26.0–34.0)
MCHC: 32.7 g/dL (ref 30.0–36.0)
MCV: 89 fL (ref 80.0–100.0)
Platelets: 233 10*3/uL (ref 150–400)
RBC: 4.99 MIL/uL (ref 4.22–5.81)
RDW: 13.9 % (ref 11.5–15.5)
WBC: 4.7 10*3/uL (ref 4.0–10.5)
nRBC: 0 % (ref 0.0–0.2)

## 2019-12-02 LAB — BASIC METABOLIC PANEL
Anion gap: 9 (ref 5–15)
BUN: 12 mg/dL (ref 6–20)
CO2: 23 mmol/L (ref 22–32)
Calcium: 8.6 mg/dL — ABNORMAL LOW (ref 8.9–10.3)
Chloride: 105 mmol/L (ref 98–111)
Creatinine, Ser: 1.33 mg/dL — ABNORMAL HIGH (ref 0.61–1.24)
GFR calc Af Amer: >60 mL/min (ref >60)
GFR calc non Af Amer: 58 mL/min — ABNORMAL LOW (ref >60)
Glucose, Bld: 224 mg/dL — ABNORMAL HIGH (ref 70–99)
Potassium: 4.6 mmol/L (ref 3.5–5.1)
Sodium: 137 mmol/L (ref 135–145)

## 2019-12-02 LAB — GLUCOSE, CAPILLARY
Glucose-Capillary: 235 mg/dL — ABNORMAL HIGH (ref 70–99)
Glucose-Capillary: 136 mg/dL — ABNORMAL HIGH (ref 70–99)
Glucose-Capillary: 154 mg/dL — ABNORMAL HIGH (ref 70–99)
Glucose-Capillary: 205 mg/dL — ABNORMAL HIGH (ref 70–99)
Glucose-Capillary: 113 mg/dL — ABNORMAL HIGH (ref 70–99)

## 2019-12-02 LAB — HEMOGLOBIN A1C
Hgb A1c MFr Bld: 6.3 % — ABNORMAL HIGH (ref 4.8–5.6)
Mean Plasma Glucose: 134.11 mg/dL

## 2019-12-02 LAB — HIV ANTIBODY (ROUTINE TESTING W REFLEX): HIV Screen 4th Generation wRfx: NONREACTIVE

## 2019-12-02 MED ORDER — ACETAMINOPHEN 325 MG PO TABS
650.0000 mg | ORAL_TABLET | Freq: Four times a day (QID) | ORAL | Status: DC | PRN
  Administered 2019-12-02 – 2019-12-03 (×3): 650 mg via ORAL
  Filled 2019-12-02 (×3): qty 2

## 2019-12-02 NOTE — Progress Notes (Signed)
PROGRESS NOTE    Christian [name]  NIO:[id number] DOB: 12-02-1960 DOA: 12/01/2019 PCP: Ladell Pier, MD   Brief Narrative:  Christian [name] is a 59 y.o. male with medical history significant of COPD/eosinophillic asthma, never smoker, OSA not compliant with CPAP, DM2, HTN. Pt presents to ED with acute worsening of breathing.  Breathing progressively worsening "for months" but acutely worsened for past couple of days.  Looks like PCP started him on ABx. Symptoms are severe, persistent, nothing makes better or worse. Ran out of Advair last month apparently. EMS gave 2g mg, duoneb, 125 solumedrol en route to ED. In ED: Wheezing persists. CXR neg. Hospitalist asked to admit.  Assessment & Plan:   Principal Problem:   Severe persistent asthma with acute exacerbation Active Problems:   Essential hypertension   Diabetes mellitus with neuropathy (HCC)   Eosinophilic asthma   Acute hypoxic respiratory failure secondary to severe persistent asthma with acute exacerbation, POA -Pulmonology consulted given patient's ongoing symptoms despite recent evaluation, appreciate insight and recommendations  -Continue Dulera, Incruse, prednisone, amoxicillin with as needed nebs -Appreciate pulmonology educating the patient given his medication noncompliance and confusion on medication regimen -Patient now off of oxygen at rest, ambulatory oxygen screen without overt hypoxia but patient remains quite symptomatic with minimal exertion  DM2, controlled- Hold home metformin Continue sliding scale insulin, hypoglycemic protocol Continue diabetic diet Lab Results  Component Value Date   HGBA1C 6.3 (H) 12/02/2019   HTN - Cont home BP meds  DVT prophylaxis: Lovenox Code Status: Full Family Communication: None present  Status is: Observation  Dispo: The patient is from: Home              Anticipated d/c is to: Home              Anticipated d/c date is: 24 hours              Patient currently  not medically stable for discharge due to ongoing need for medication management, titration of inhalers, steroids and severe dyspnea with minimal exertion  Consultants:   Pulmonology  Procedures:   None  Antimicrobials:  Amoxicillin  Subjective: No acute issues or events overnight, patient indicates he is somewhat improved from admission but still remarkably short of breath with minimal exertion, currently having difficulty conversing due to ongoing cough and respiratory status.  Objective: Vitals:   12/01/19 2305 12/02/19 0100 12/02/19 0144 12/02/19 0334  BP: 126/76 97/64 137/80 111/79  Pulse: 81 77 (!) 102 78  Resp: 17 13 16 20   Temp:   98.6 F (37 C) (!) 97.4 F (36.3 C)  TempSrc:   Oral Oral  SpO2: 96% 97% 100% 99%  Weight:      Height:       No intake or output data in the 24 hours ending 12/02/19 0749 Filed Weights   12/01/19 1938  Weight: 88 kg    Examination:  General:  Pleasantly resting in bed, No acute distress. HEENT:  Normocephalic atraumatic.  Sclerae nonicteric, noninjected.  Extraocular movements intact bilaterally. Neck:  Without mass or deformity.  Trachea is midline. Lungs: Left apical wheeze both inspiratory and expiratory, right lung field essentially clear; bilaterally without overt rhonchi or rales Heart:  Regular rate and rhythm.  Without murmurs, rubs, or gallops. Abdomen:  Soft, nontender, nondistended.  Without guarding or rebound. Extremities: Without cyanosis, clubbing, edema, or obvious deformity. Vascular:  Dorsalis pedis and posterior tibial pulses palpable bilaterally. Skin:  Warm and dry, no erythema,  no ulcerations.   Data Reviewed: I have personally reviewed following labs and imaging studies  CBC: Recent Labs  Lab 12/01/19 1943 12/02/19 0421  WBC 5.8 4.7  NEUTROABS 2.6  --   HGB 15.6 14.5  HCT 48.0 44.4  MCV 89.9 89.0  PLT 246 716   Basic Metabolic Panel: Recent Labs  Lab 12/01/19 1943 12/02/19 0421  NA 139 137    K 3.9 4.6  CL 104 105  CO2 27 23  GLUCOSE 120* 224*  BUN 9 12  CREATININE 1.31* 1.33*  CALCIUM 8.8* 8.6*   GFR: Estimated Creatinine Clearance: 66.8 mL/min (A) (by C-G formula based on SCr of 1.33 mg/dL (H)). Liver Function Tests: No results for input(s): AST, ALT, ALKPHOS, BILITOT, PROT, ALBUMIN in the last 168 hours. No results for input(s): LIPASE, AMYLASE in the last 168 hours. No results for input(s): AMMONIA in the last 168 hours. Coagulation Profile: No results for input(s): INR, PROTIME in the last 168 hours. Cardiac Enzymes: No results for input(s): CKTOTAL, CKMB, CKMBINDEX, TROPONINI in the last 168 hours. BNP (last 3 results) No results for input(s): PROBNP in the last 8760 hours. HbA1C: Recent Labs    12/02/19 0421  HGBA1C 6.3*   CBG: Recent Labs  Lab 12/01/19 2257 12/02/19 0152  GLUCAP 157* 235*   Lipid Profile: No results for input(s): CHOL, HDL, LDLCALC, TRIG, CHOLHDL, LDLDIRECT in the last 72 hours. Thyroid Function Tests: No results for input(s): TSH, T4TOTAL, FREET4, T3FREE, THYROIDAB in the last 72 hours. Anemia Panel: No results for input(s): VITAMINB12, FOLATE, FERRITIN, TIBC, IRON, RETICCTPCT in the last 72 hours. Sepsis Labs: No results for input(s): PROCALCITON, LATICACIDVEN in the last 168 hours.  Recent Results (from the past 240 hour(s))  SARS Coronavirus 2 by RT PCR (hospital order, performed in [facility] hospital lab) Nasopharyngeal Nasopharyngeal Swab     Status: None   Collection Time: 12/01/19  7:43 PM   Specimen: Nasopharyngeal Swab  Result Value Ref Range Status   SARS Coronavirus 2 NEGATIVE NEGATIVE Final    Comment: (NOTE) SARS-CoV-2 target nucleic acids are NOT DETECTED.  The SARS-CoV-2 RNA is generally detectable in upper and lower respiratory specimens during the acute phase of infection. The lowest concentration of SARS-CoV-2 viral copies this assay can detect is 250 copies / mL. A negative result does not preclude  SARS-CoV-2 infection and should not be used as the sole basis for treatment or other patient management decisions.  A negative result may occur with improper specimen collection / handling, submission of specimen other than nasopharyngeal swab, presence of viral mutation(s) within the areas targeted by this assay, and inadequate number of viral copies (<250 copies / mL). A negative result must be combined with clinical observations, patient history, and epidemiological information.  Fact Sheet for Patients:   StrictlyIdeas.no  Fact Sheet for Healthcare Providers: BankingDealers.co.za  This test is not yet approved or  cleared by the [place] FDA and has been authorized for detection and/or diagnosis of SARS-CoV-2 by FDA under an Emergency Use Authorization (EUA).  This EUA will remain in effect (meaning this test can be used) for the duration of the COVID-19 declaration under Section 564(b)(1) of the Act, 21 U.S.C. section 360bbb-3(b)(1), unless the authorization is terminated or revoked sooner.  Performed at [facility], [place] [address], [place], [place] [address]          Radiology Studies: DG Chest Portable 1 View  Result Date: 12/01/2019 CLINICAL DATA:  Dyspnea.  History of COPD. EXAM: PORTABLE CHEST 1 VIEW COMPARISON:  September 29, 2019 FINDINGS: The heart size and mediastinal contours are within normal limits. Both lungs are clear. The visualized skeletal structures are unremarkable. IMPRESSION: No active disease. Electronically Signed   By: Constance Holster M.D.   On: 12/01/2019 20:16        Scheduled Meds: . amLODipine  10 mg Oral Daily  . amoxicillin  500 mg Oral TID  . enoxaparin (LOVENOX) injection  40 mg Subcutaneous Q24H  . gabapentin  300 mg Oral TID  . lisinopril  10 mg Oral Daily   And  . hydrochlorothiazide  12.5 mg Oral Daily  . insulin aspart  0-15 Units Subcutaneous TID WC  . insulin aspart   0-5 Units Subcutaneous QHS  . mometasone-formoterol  1 puff Inhalation BID  . montelukast  10 mg Oral QHS  . pravastatin  20 mg Oral QHS  . predniSONE  40 mg Oral Q breakfast  . umeclidinium bromide  1 puff Inhalation Daily    LOS: 0 days   Time spent: 55min  Syvanna Ciolino C Kayd Launer, DO [facility]  If 7PM-7AM, please contact night-coverage [email or web address]  12/02/2019, 7:49 AM

## 2019-12-02 NOTE — Progress Notes (Signed)
OT Cancellation Note  Patient Details Name: Christian [name] MRN: [id number] DOB: 1960-05-23   Cancelled Treatment:    Reason Eval/Treat Not Completed: OT screened, no needs identified, will sign off Per PT patient is independent with mobility and ADLs and o2 sats WFL. OT evaluation not indicated at this time. Please reorder if patient's status changes.  Emmajane Altamura L Saabir Blyth 12/02/2019, 11:26 AM

## 2019-12-02 NOTE — Plan of Care (Signed)

## 2019-12-02 NOTE — Plan of Care (Signed)

## 2019-12-02 NOTE — Progress Notes (Signed)
Nutrition Brief Note  RD consulted for assessment of nutritional status/ needs.   Wt Readings from Last 15 Encounters:  12/01/19 88 kg  10/14/19 88 kg  10/11/19 88 kg  09/08/19 87.1 kg  06/20/19 87.7 kg  05/05/19 88 kg  04/05/19 84.4 kg  03/09/19 86 kg  11/25/18 82.6 kg  08/19/18 82.4 kg  05/28/18 80.3 kg  04/06/18 79.4 kg  10/22/17 79.2 kg  10/14/17 79.1 kg  08/06/17 78.5 kg   Christian [name] is a 59 y.o. male with medical history significant of COPD/eosinophillic asthma, never smoker, OSA not compliant with CPAP, DM2, HTN.  Pt admitted with severe persistent asthma with acute exacerbation.   Spoke with pt at bedside, who reports very good appetite. PTA, he consumes 2 meals per day, usually eggs, meat, and toast OR breakfast sandwich for breakfast and meat, starch, and vegetable for dinner. Pt shares that he typically skips lunch, especially if he is busy running errands (he reports he walks daily and picks up trash in his neighborhood park as a community service).   Pt denies any weight loss. He reports UBW is around 194#. Reviewed wt hx; wt has been stable over the past 6 months.   Medications reviewed and include prednisone.   Lab Results  Component Value Date   HGBA1C 6.3 (H) 12/02/2019   PTA DM medications are 500 mg metformin daily.   Labs reviewed: CBGS: 157-235 (inpatient orders for glycemic control are 0-15 units insulin aspart TID with meals and 0-5 units insulin aspart daily at bedtime).   Current diet order is carb modified, patient is consuming approximately 100% of meals at this time. Labs and medications reviewed.   No nutrition interventions warranted at this time. If nutrition issues arise, please consult RD.   Loistine Chance, RD, LDN, [place] Registered Dietitian II Certified Diabetes Care and Education Specialist Please refer to [facility] for RD and/or RD on-call/weekend/after hours pager

## 2019-12-02 NOTE — Consult Note (Signed)
NAME:  Christian [name], MRN:  [id number], DOB:  [birth year], LOS: 0 ADMISSION DATE:  12/01/2019, CONSULTATION DATE:  8/6 REFERRING MD:  Dr. Avon Gully, CHIEF COMPLAINT:  Asthma exacerbation   Brief History   59 year old male with moderate persistent eosinophilic type asthma admitted for acute exacerbation on 8/5.   History of present illness   59 year old male never smoker diagnosed with asthma in his 53s. Also mild OSA on CPAP with which he is non-compliant. He follows with Dr. Ander Slade in the pulmonary clinic. Eosinophilic workup positive in 2020 for elevated eosinophils and IgE. He has been out of Advair for about a month prior to arrival and states that even before that, he was only taking it about once per day. He lists financial barriers as the reason he has been unable to get Advair. Also recently started on Nucala, of which his first dose was received within the week prior to arrival after he began to exacerbate. His exacerbation started by his estimation approximately 7/22 with non-productive cough and dyspnea. He has been using rescue SABA more than 4 times per day.  This caused him to present to ED 8/5 and he was admitted to the hospitalist service for treatment of acute asthma exacerbation with dulera, incurse, and duoneb PRN as well as a prednisone taper after one dose of solumedrol 125 in ED. PCCM consulted 8/6 for further management of asthma.    Past Medical History   has a past medical history of Allergy, Arthritis, Asthma, Chronic lower back pain, COPD (chronic obstructive pulmonary disease) ([place]), Dyspnea, High cholesterol, Hypertension, Neuropathy, OSA on CPAP, Secondhand smoke exposure, Sleep apnea, and Type II diabetes mellitus ([place]) (dx'd 01/17/2016).   Significant Hospital Events   8/5 admit  Consults:  PCCM  Procedures:    Significant Diagnostic Tests:    Micro Data:    Antimicrobials:  Amoxicillin 8/5 >  Interim history/subjective:    Objective   Blood  pressure 132/72, pulse 69, temperature 97.6 F (36.4 C), temperature source Oral, resp. rate 17, height '5\' 10"'$  (1.778 m), weight 88 kg, SpO2 93 %.       No intake or output data in the 24 hours ending 12/02/19 1051 Filed Weights   12/01/19 1938  Weight: 88 kg    Examination: General: Middle aged adult male in NAD HENT: [place]/AT, PERRL, no JVD Lungs: no wheeze. Unlabored.  Cardiovascular: RRR, no MRG Abdomen: Soft, NT ND Extremities: No acute deformity or ROM limitation. No edema.  Neuro: Alert, oriented, non-focal  Resolved Hospital Problem list     Assessment & Plan:   Acute exacerbation of asthma: Sounds to me like he has never had a clear understanding of how to appropriately use his asthma medications. He had been using Advair only once per day prior to running out about one month ago, and uses Proair AND Duonebs daily in the morning plus PRN. Started on Nucala just recently and is status post one dose.  Plan: - Agree with dulera/incruse/prednisone/amoxil and PRN duonebs - Would drop incruse on discharge and place most importance on using Advair consistently BID and albuterol/Duonebs PRN only. Need to establish an accurate baseline on Advair. He will be able to get Advair more easily now with new financial assistance he tells me.  - Nucala continue at discharge.  - Pulmonary follow up scheduled 8/20 with Wyn Quaker NP.   OSA on CPAP: noncompliant due to recurrent asthma flares Plan: - will order CPAP autotitrate 5-15 cm H20 -  have encouraged him to use this at home.    Labs   CBC: Recent Labs  Lab 12/01/19 1943 12/02/19 0421  WBC 5.8 4.7  NEUTROABS 2.6  --   HGB 15.6 14.5  HCT 48.0 44.4  MCV 89.9 89.0  PLT 246 063    Basic Metabolic Panel: Recent Labs  Lab 12/01/19 1943 12/02/19 0421  NA 139 137  K 3.9 4.6  CL 104 105  CO2 27 23  GLUCOSE 120* 224*  BUN 9 12  CREATININE 1.31* 1.33*  CALCIUM 8.8* 8.6*   GFR: Estimated Creatinine Clearance: 66.8 mL/min  (A) (by C-G formula based on SCr of 1.33 mg/dL (H)). Recent Labs  Lab 12/01/19 1943 12/02/19 0421  WBC 5.8 4.7    Liver Function Tests: No results for input(s): AST, ALT, ALKPHOS, BILITOT, PROT, ALBUMIN in the last 168 hours. No results for input(s): LIPASE, AMYLASE in the last 168 hours. No results for input(s): AMMONIA in the last 168 hours.  ABG No results found for: PHART, PCO2ART, PO2ART, HCO3, TCO2, ACIDBASEDEF, O2SAT   Coagulation Profile: No results for input(s): INR, PROTIME in the last 168 hours.  Cardiac Enzymes: No results for input(s): CKTOTAL, CKMB, CKMBINDEX, TROPONINI in the last 168 hours.  HbA1C: HbA1c, POC (prediabetic range)  Date/Time Value Ref Range Status  06/20/2019 02:27 PM 6.3 5.7 - 6.4 % Final   HbA1c, POC (controlled diabetic range)  Date/Time Value Ref Range Status  08/19/2018 02:06 PM 6.3 0.0 - 7.0 % Final   Hgb A1c MFr Bld  Date/Time Value Ref Range Status  12/02/2019 04:21 AM 6.3 (H) 4.8 - 5.6 % Final    Comment:    (NOTE) Pre diabetes:          5.7%-6.4%  Diabetes:              >6.4%  Glycemic control for   <7.0% adults with diabetes   08/04/2017 12:11 PM 5.9 (H) 4.8 - 5.6 % Final    Comment:    (NOTE) Pre diabetes:          5.7%-6.4% Diabetes:              >6.4% Glycemic control for   <7.0% adults with diabetes     CBG: Recent Labs  Lab 12/01/19 2257 12/02/19 0152 12/02/19 0749  GLUCAP 157* 235* 136*    Review of Systems:   Bolds are positive  Constitutional: weight loss, gain, night sweats, Fevers, chills, fatigue .  HEENT: headaches, Sore throat, sneezing, nasal congestion, post nasal drip, Difficulty swallowing, Tooth/dental problems, visual complaints visual changes, ear ache CV:  chest pain, radiates:,Orthopnea, PND, swelling in lower extremities, dizziness, palpitations, syncope.  GI  heartburn, indigestion, abdominal pain, nausea, vomiting, diarrhea, change in bowel habits, loss of appetite, bloody stools.    Resp: cough, productive: , hemoptysis, dyspnea, chest pain, pleuritic.  Skin: rash or itching or icterus GU: dysuria, change in color of urine, urgency or frequency. flank pain, hematuria  MS: joint pain or swelling. decreased range of motion  Psych: change in mood or affect. depression or anxiety.  Neuro: difficulty with speech, weakness, numbness, ataxia   Past Medical History  He,  has a past medical history of Allergy, Arthritis, Asthma, Chronic lower back pain, COPD (chronic obstructive pulmonary disease) ([place]), Dyspnea, High cholesterol, Hypertension, Neuropathy, OSA on CPAP, Secondhand smoke exposure, Sleep apnea, and Type II diabetes mellitus ([place]) (dx'd 01/17/2016).   Surgical History    Past Surgical History:  Procedure Laterality Date  .  ANTERIOR CERVICAL DECOMP/DISCECTOMY FUSION  ~ 1995   "titanium"  . APPENDECTOMY  ~ 1977  . BACK SURGERY     x 2  L 4disectomy  . COLONOSCOPY W/ POLYPECTOMY    . INGUINAL HERNIA REPAIR Right ~ 2000  . LUMBAR DISC SURGERY  ~ 2000; ~2006  . NASAL SINUS SURGERY  ~ 2007  . TOTAL KNEE ARTHROPLASTY Left 08/06/2017   Procedure: LEFT TOTAL KNEE ARTHROPLASTY;  Surgeon: Tarry Kos, MD;  Location: [facility] OR;  Service: Orthopedics;  Laterality: Left;     Social History   reports that he is a non-smoker but has been exposed to tobacco smoke. He has never used smokeless tobacco. He reports that he does not drink alcohol and does not use drugs.   Family History   His family history includes Heart Problems in his brother; Hypertension in his father and mother; Rectal cancer in his brother. There is no history of Colon cancer, Esophageal cancer, or Stomach cancer.   Allergies Allergies  Allergen Reactions  . Phenergan [Promethazine Hcl] Itching and Other (See Comments)    burning     Home Medications  Prior to Admission medications   Medication Sig Start Date End Date Taking? Authorizing Provider  albuterol (VENTOLIN HFA) 108 (90 Base) MCG/ACT  inhaler INHALE 1 TO 2 PUFFS BY MOUTH EVERY 6 HOURS AS NEEDED FOR WHEEZING FOR SHORTNESS OF BREATH Patient taking differently: Inhale 1-2 puffs into the lungs every 6 (six) hours as needed for wheezing or shortness of breath.  10/18/19  Yes Marcine Matar, MD  amLODipine (NORVASC) 10 MG tablet Take 1 tablet (10 mg total) by mouth daily. 10/18/19  Yes Marcine Matar, MD  amoxicillin (AMOXIL) 500 MG capsule Take 1 capsule (500 mg total) by mouth 3 (three) times daily. 11/23/19  Yes Hoy Register, MD  cetirizine (ZYRTEC) 10 MG tablet Take 1 tablet (10 mg total) by mouth daily. 10/14/19  Yes Bevelyn Ngo, NP  furosemide (LASIX) 20 MG tablet Take 1 tablet (20 mg total) by mouth daily. X 1 week then prn swelling Patient taking differently: Take 20 mg by mouth daily as needed for fluid.  10/18/19  Yes Marcine Matar, MD  gabapentin (NEURONTIN) 300 MG capsule Take 1 capsule (300 mg total) by mouth 3 (three) times daily. 10/18/19  Yes Marcine Matar, MD  ipratropium-albuterol (DUONEB) 0.5-2.5 (3) MG/3ML SOLN Take 3 mLs by nebulization every 4 (four) hours as needed. Patient taking differently: Take 3 mLs by nebulization every 4 (four) hours as needed (for breathing).  10/14/19  Yes Bevelyn Ngo, NP  lisinopril-hydrochlorothiazide (ZESTORETIC) 10-12.5 MG tablet Take 1 tablet by mouth daily. 10/18/19  Yes Marcine Matar, MD  metFORMIN (GLUCOPHAGE) 500 MG tablet Take 1 tablet (500 mg total) by mouth every morning. Patient taking differently: Take 500 mg by mouth daily with breakfast.  10/18/19  Yes Marcine Matar, MD  montelukast (SINGULAIR) 10 MG tablet Take 1 tablet (10 mg total) by mouth at bedtime. 10/18/19  Yes Marcine Matar, MD  pravastatin (PRAVACHOL) 20 MG tablet Take 1 tablet (20 mg total) by mouth at bedtime. 10/18/19  Yes Marcine Matar, MD  ACCU-CHEK GUIDE test strip USE 1  TEST STRIP ONCE DAILY TO CHECK BLOOD SUGAR 08/26/19   Marcine Matar, MD  Blood Glucose Monitoring  Suppl (ONE TOUCH ULTRA 2) w/Device KIT Use as directed 3 times daily E11.9 03/31/17   Marcine Matar, MD  Fluticasone-Salmeterol (ADVAIR DISKUS) 500-50  MCG/DOSE AEPB Inhale 1 puff into the lungs 2 (two) times daily. Patient not taking: Reported on 12/01/2019 10/18/19   Ladell Pier, MD  Mepolizumab (NUCALA) 100 MG SOLR Inject 100 mg into the skin every 28 (twenty-eight) days. Patient not taking: Reported on 10/14/2019 07/08/19   Laurin Coder, MD  TRUEPLUS LANCETS 28G MISC Check glucose 2 times a day 02/01/16   Maren Reamer, MD      Georgann Housekeeper, AGACNP-BC [place] for personal pager PCCM on call pager [phone]  12/02/2019 11:24 AM

## 2019-12-02 NOTE — Evaluation (Signed)
Physical Therapy Evaluation & Discharge Patient Details Name: Christian [name] MRN: [id number] DOB: 05-10-1960 Today's Date: 12/02/2019   History of Present Illness  Pt is a 59 y.o. male admitted 12/01/19 with progressively worsening SOB, acutely worsened the past couple days. Workup for asthma exacerbation. PMH includes COPD/eosinophillic asthma, OSA, DM2, HTN.    Clinical Impression  Patient evaluated by Physical Therapy with no further acute PT needs identified. PTA, pt independent and lives with family. Today, pt independent with mobility and ADL tasks. SpO2 >/93% on RA with activity. Educ re: energy conservation strategies and handout provided. All education has been completed and the patient has no further questions. Acute PT is signing off. Thank you for this referral.    Follow Up Recommendations No PT follow up    Equipment Recommendations  None recommended by PT    Recommendations for Other Services       Precautions / Restrictions Precautions Precautions: None Restrictions Weight Bearing Restrictions: No      Mobility  Bed Mobility Overal bed mobility: Independent                Transfers Overall transfer level: Independent Equipment used: None                Ambulation/Gait Ambulation/Gait assistance: Independent Gait Distance (Feet): 400 Feet Assistive device: None Gait Pattern/deviations: WFL(Within Functional Limits) Gait velocity: Decreased Gait velocity interpretation: 1.31 - 2.62 ft/sec, indicative of limited community Conservation officer, historic buildings [place] (Stroke Patients Only)       Balance Overall balance assessment: No apparent balance deficits (not formally assessed)                                           Pertinent Vitals/Pain Pain Assessment: No/denies pain    Home Living Family/patient expects to be discharged to:: Private residence Living Arrangements:  Spouse/significant other;Other relatives (grandson) Available Help at Discharge: Family;Available PRN/intermittently Type of Home: House Home Access: Stairs to enter     Home Layout: One level Home Equipment: None Additional Comments: Wife has cardiac hx and not able to physically assist. 7th grade age grandson lives with them    Prior Function Level of Independence: Independent         Comments: Drives. Reports does not have issue with energy conservation; SOB/asthma exacerbations typically happen at home, pt thinks related to too low medication dosage     Hand Dominance        Extremity/Trunk Assessment   Upper Extremity Assessment Upper Extremity Assessment: Overall WFL for tasks assessed    Lower Extremity Assessment Lower Extremity Assessment: Overall WFL for tasks assessed    Cervical / Trunk Assessment Cervical / Trunk Assessment: Normal  Communication   Communication: No difficulties  Cognition Arousal/Alertness: Awake/alert Behavior During Therapy: WFL for tasks assessed/performed Overall Cognitive Status: Within Functional Limits for tasks assessed                                        General Comments General comments (skin integrity, edema, etc.): SpO2 93-97% on RA, DOE 2/4. Energy conservation handout provided with education on strategies    Exercises     Assessment/Plan  PT Assessment Patent does not need any further PT services  PT Problem List         PT Treatment Interventions      PT Goals (Current goals can be found in the Care Plan section)  Acute Rehab PT Goals PT Goal Formulation: All assessment and education complete, DC therapy    Frequency     Barriers to discharge        Co-evaluation               AM-PAC PT "6 Clicks" Mobility  Outcome Measure Help needed turning from your back to your side while in a flat bed without using bedrails?: None Help needed moving from lying on your back to sitting  on the side of a flat bed without using bedrails?: None Help needed moving to and from a bed to a chair (including a wheelchair)?: None Help needed standing up from a chair using your arms (e.g., wheelchair or bedside chair)?: None Help needed to walk in hospital room?: None Help needed climbing 3-5 steps with a railing? : None 6 Click Score: 24    End of Session   Activity Tolerance: Patient tolerated treatment well Patient left: in chair;with call bell/phone within reach Nurse Communication: Mobility status PT Visit Diagnosis: Other abnormalities of gait and mobility (R26.89)    Time: 3437-3578 PT Time Calculation (min) (ACUTE ONLY): 14 min   Charges:   PT Evaluation $PT Eval Low Complexity: [place], PT, DPT Acute Rehabilitation Services  Pager [phone] Office [place] 12/02/2019, 9:30 AM

## 2019-12-03 ENCOUNTER — Observation Stay (HOSPITAL_COMMUNITY): Payer: Medicare Other

## 2019-12-03 ENCOUNTER — Encounter (HOSPITAL_COMMUNITY): Payer: Self-pay | Admitting: Internal Medicine

## 2019-12-03 DIAGNOSIS — K5900 Constipation, unspecified: Secondary | ICD-10-CM | POA: Diagnosis not present

## 2019-12-03 DIAGNOSIS — R109 Unspecified abdominal pain: Secondary | ICD-10-CM | POA: Diagnosis not present

## 2019-12-03 DIAGNOSIS — E1140 Type 2 diabetes mellitus with diabetic neuropathy, unspecified: Secondary | ICD-10-CM | POA: Diagnosis not present

## 2019-12-03 DIAGNOSIS — J4551 Severe persistent asthma with (acute) exacerbation: Secondary | ICD-10-CM | POA: Diagnosis not present

## 2019-12-03 DIAGNOSIS — J8283 Eosinophilic asthma: Secondary | ICD-10-CM | POA: Diagnosis not present

## 2019-12-03 DIAGNOSIS — I10 Essential (primary) hypertension: Secondary | ICD-10-CM | POA: Diagnosis not present

## 2019-12-03 DIAGNOSIS — K429 Umbilical hernia without obstruction or gangrene: Secondary | ICD-10-CM | POA: Diagnosis not present

## 2019-12-03 LAB — CBC
HCT: 42.3 % (ref 39.0–52.0)
Hemoglobin: 14.1 g/dL (ref 13.0–17.0)
MCH: 29.4 pg (ref 26.0–34.0)
MCHC: 33.3 g/dL (ref 30.0–36.0)
MCV: 88.1 fL (ref 80.0–100.0)
Platelets: 232 10*3/uL (ref 150–400)
RBC: 4.8 MIL/uL (ref 4.22–5.81)
RDW: 13.6 % (ref 11.5–15.5)
WBC: 13.3 10*3/uL — ABNORMAL HIGH (ref 4.0–10.5)
nRBC: 0 % (ref 0.0–0.2)

## 2019-12-03 LAB — BASIC METABOLIC PANEL
Anion gap: 11 (ref 5–15)
BUN: 15 mg/dL (ref 6–20)
CO2: 23 mmol/L (ref 22–32)
Calcium: 8.6 mg/dL — ABNORMAL LOW (ref 8.9–10.3)
Chloride: 103 mmol/L (ref 98–111)
Creatinine, Ser: 1.26 mg/dL — ABNORMAL HIGH (ref 0.61–1.24)
GFR calc Af Amer: >60 mL/min (ref >60)
GFR calc non Af Amer: >60 mL/min (ref >60)
Glucose, Bld: 124 mg/dL — ABNORMAL HIGH (ref 70–99)
Potassium: 4.2 mmol/L (ref 3.5–5.1)
Sodium: 137 mmol/L (ref 135–145)

## 2019-12-03 LAB — GLUCOSE, CAPILLARY
Glucose-Capillary: 107 mg/dL — ABNORMAL HIGH (ref 70–99)
Glucose-Capillary: 152 mg/dL — ABNORMAL HIGH (ref 70–99)
Glucose-Capillary: 166 mg/dL — ABNORMAL HIGH (ref 70–99)

## 2019-12-03 MED ORDER — IOHEXOL 300 MG/ML  SOLN
100.0000 mL | Freq: Once | INTRAMUSCULAR | Status: AC | PRN
  Administered 2019-12-03: 100 mL via INTRAVENOUS

## 2019-12-03 MED ORDER — IOHEXOL 9 MG/ML PO SOLN
500.0000 mL | ORAL | Status: AC
  Administered 2019-12-03 (×2): 500 mL via ORAL

## 2019-12-03 MED ORDER — PREDNISONE 20 MG PO TABS: 40.0000 mg | ORAL_TABLET | Freq: Every day | ORAL | 0 refills | Status: AC

## 2019-12-03 NOTE — Progress Notes (Signed)
Discharge package printed and instruction given to patient. Verbalizing understanding.

## 2019-12-03 NOTE — Progress Notes (Signed)
Pt has c/o LUQ pain and discomfort,warm compresses has little- no relief, Tylenol has some relief. No c/o N/V, No fever. Last BM 8/7. VS stable. Noted guarding. ABD is soft, tender to touch. Oncoming nurse wll be made aware.

## 2019-12-03 NOTE — Discharge Summary (Signed)
Physician Discharge Summary  Christian [name] KNL:[id number] DOB: April 18, 1961 DOA: 12/01/2019  PCP: Ladell Pier, MD  Admit date: 12/01/2019 Discharge date: 12/03/2019  Admitted From: Observation Disposition: home  Recommendations for Outpatient Follow-up:  1. Follow up with PCP in 1-2 weeks 2. Please obtain BMP/CBC in one week 3. Please follow up on the following pending results:  Home Health:No Equipment/Devices:none  Discharge Condition:Stable CODE STATUS:Full code Diet recommendation: Regular healthy diet  Brief/Interim Summary: Christian [name] a 59 y.o.malewith medical history significant ofCOPD/eosinophillic asthma, never smoker, OSA not compliant with CPAP, DM2, HTN. Pt presents to ED with acute worsening of breathing. Breathing progressively worsening "for months" but acutely worsened for past couple of days. Looks like PCP started him on ABx. Symptoms are severe, persistent, nothing makes better or worse. Ran out of Advair last month apparently. EMS gave 2g mg, duoneb, 125 solumedrol en route to ED. In ED: Wheezing persists. CXR neg. Hospitalist asked to admit  Discharge Diagnoses:  Principal Problem:   Severe persistent asthma with acute exacerbation Active Problems:   Essential hypertension   Diabetes mellitus with neuropathy (HCC)   Eosinophilic asthma  Acute hypoxic respiratory failure secondary to severe persistent asthma with acute exacerbation, POA -Pulmonology consulted given patient's ongoing symptoms despite recent evaluation, appreciate insight and recommendations  -Continue Dulera, Incruse, prednisone, amoxicillin with as needed nebs -Appreciate pulmonology educating the patient given his medication noncompliance and confusion on medication regimen -Patient now off of oxygen at rest, ambulatory oxygen screen without overt hypoxia stable for d/c today  DM2, controlled- Resume home meds and ada diet  HTN - Cont home BP meds  ABD PAIN 2/2  constipation -kub notable for constipation -CT neg for acute finding -d/c home encourage fluid intake and recommended otc fiber    Discharge Instructions  Discharge Instructions    Call MD for:  difficulty breathing, headache or visual disturbances   Complete by: As directed    Call MD for:  extreme fatigue   Complete by: As directed    Call MD for:  hives   Complete by: As directed    Call MD for:  persistant dizziness or light-headedness   Complete by: As directed    Call MD for:  persistant nausea and vomiting   Complete by: As directed    Call MD for:  severe uncontrolled pain   Complete by: As directed    Call MD for:  temperature >100.4   Complete by: As directed    Diet - low sodium heart healthy   Complete by: As directed    Increase activity slowly   Complete by: As directed      Allergies as of 12/03/2019      Reactions   Phenergan [promethazine Hcl] Itching, Other (See Comments)   burning      Medication List    TAKE these medications   Accu-Chek Guide test strip Generic drug: glucose blood USE 1  TEST STRIP ONCE DAILY TO CHECK BLOOD SUGAR   albuterol 108 (90 Base) MCG/ACT inhaler Commonly known as: VENTOLIN HFA INHALE 1 TO 2 PUFFS BY MOUTH EVERY 6 HOURS AS NEEDED FOR WHEEZING FOR SHORTNESS OF BREATH What changed:   how much to take  how to take this  when to take this  reasons to take this  additional instructions   amLODipine 10 MG tablet Commonly known as: NORVASC Take 1 tablet (10 mg total) by mouth daily.   amoxicillin 500 MG capsule Commonly known as: AMOXIL Take  1 capsule (500 mg total) by mouth 3 (three) times daily.   cetirizine 10 MG tablet Commonly known as: ZYRTEC Take 1 tablet (10 mg total) by mouth daily.   Fluticasone-Salmeterol 500-50 MCG/DOSE Aepb Commonly known as: Advair Diskus Inhale 1 puff into the lungs 2 (two) times daily.   furosemide 20 MG tablet Commonly known as: LASIX Take 1 tablet (20 mg total) by mouth  daily. X 1 week then prn swelling What changed:   when to take this  reasons to take this  additional instructions   gabapentin 300 MG capsule Commonly known as: NEURONTIN Take 1 capsule (300 mg total) by mouth 3 (three) times daily.   ipratropium-albuterol 0.5-2.5 (3) MG/3ML Soln Commonly known as: DUONEB Take 3 mLs by nebulization every 4 (four) hours as needed. What changed: reasons to take this   lisinopril-hydrochlorothiazide 10-12.5 MG tablet Commonly known as: ZESTORETIC Take 1 tablet by mouth daily.   metFORMIN 500 MG tablet Commonly known as: GLUCOPHAGE Take 1 tablet (500 mg total) by mouth every morning. What changed: when to take this   montelukast 10 MG tablet Commonly known as: SINGULAIR Take 1 tablet (10 mg total) by mouth at bedtime.   Nucala 100 MG Solr Generic drug: Mepolizumab Inject 100 mg into the skin every 28 (twenty-eight) days.   ONE TOUCH ULTRA 2 w/Device Kit Use as directed 3 times daily E11.9   pravastatin 20 MG tablet Commonly known as: PRAVACHOL Take 1 tablet (20 mg total) by mouth at bedtime.   predniSONE 20 MG tablet Commonly known as: DELTASONE Take 2 tablets (40 mg total) by mouth daily with breakfast for 5 days. Start taking on: December 04, 2019   TRUEplus Lancets 28G Misc Check glucose 2 times a day       Follow-up Information    Lauraine Rinne, NP Follow up on 12/16/2019.   Specialty: Pulmonary Disease Why: 9:00 Contact information: [address] [place] [address] [phone]              Allergies  Allergen Reactions  . Phenergan [Promethazine Hcl] Itching and Other (See Comments)    burning    Consultations:  pulm   Procedures/Studies: DG Abd 1 View  Result Date: 12/03/2019 CLINICAL DATA:  Abdominal pain. EXAM: ABDOMEN - 1 VIEW COMPARISON:  None. FINDINGS: There is moderate stool within the ascending colon. Otherwise bowel loops are normal in caliber. No free intraperitoneal air  identified. No evidence for organomegaly. Convex LEFT scoliosis of the lumbar spine is associated with significant degenerative changes. IMPRESSION: Moderate stool burden. Electronically Signed   By: Nolon Nations M.D.   On: 12/03/2019 10:28   CT ABDOMEN PELVIS W CONTRAST  Result Date: 12/03/2019 CLINICAL DATA:  Left upper quadrant pain EXAM: CT ABDOMEN AND PELVIS WITH CONTRAST TECHNIQUE: Multidetector CT imaging of the abdomen and pelvis was performed using the standard protocol following bolus administration of intravenous contrast. CONTRAST:  136m OMNIPAQUE IOHEXOL 300 MG/ML  SOLN COMPARISON:  None. FINDINGS: Lower chest: No acute abnormality. Hepatobiliary: Small hypodensity in the right hepatic lobe measures 13 mm. There appears to be some peripheral contrast enhancement which suggest this could be a hemangioma. Gallbladder contracted, grossly unremarkable. Pancreas: No focal abnormality or ductal dilatation. Spleen: No focal abnormality.  Normal size. Adrenals/Urinary Tract: Left renal parapelvic cysts. Left lower pole cyst. No hydronephrosis. Adrenal glands and urinary bladder unremarkable. Stomach/Bowel: Normal appendix. Few scattered sigmoid diverticula. No active diverticulitis. Stomach and small bowel decompressed, unremarkable. Vascular/Lymphatic:  No evidence of aneurysm or adenopathy. Reproductive: No visible focal abnormality. Other: No free fluid or free air. Small umbilical hernia containing fat. Musculoskeletal: No acute bony abnormality. Degenerative changes in the lumbar spine. IMPRESSION: No acute findings in the abdomen or pelvis. 13 mm hypodensity within the right hepatic lobe, favor hemangioma. Small umbilical hernia containing fat. Electronically Signed   By: Rolm Baptise M.D.   On: 12/03/2019 15:11   DG Chest Portable 1 View  Result Date: 12/01/2019 CLINICAL DATA:  Dyspnea.  History of COPD. EXAM: PORTABLE CHEST 1 VIEW COMPARISON:  September 29, 2019 FINDINGS: The heart size and  mediastinal contours are within normal limits. Both lungs are clear. The visualized skeletal structures are unremarkable. IMPRESSION: No active disease. Electronically Signed   By: Constance Holster M.D.   On: 12/01/2019 20:16       Subjective:   Discharge Exam: Vitals:   12/03/19 0923 12/03/19 1424  BP:  121/77  Pulse:  70  Resp:  17  Temp:  97.8 F (36.6 C)  SpO2: 96% 97%   Vitals:   12/03/19 0734 12/03/19 0921 12/03/19 0923 12/03/19 1424  BP: 119/76   121/77  Pulse: (!) 57   70  Resp: 17   17  Temp: 98.1 F (36.7 C)   97.8 F (36.6 C)  TempSrc: Oral   Oral  SpO2: 98% 96% 96% 97%  Weight:      Height:        General: Pt is alert, awake, not in acute distress Cardiovascular: RRR, S1/S2 +, no rubs, no gallops Respiratory: CTA bilaterally, no wheezing, no rhonchi Abdominal: Soft, NT, ND, bowel sounds + Extremities: no edema, no cyanosis    The results of significant diagnostics from this hospitalization (including imaging, microbiology, ancillary and laboratory) are listed below for reference.     Microbiology: Recent Results (from the past 240 hour(s))  SARS Coronavirus 2 by RT PCR (hospital order, performed in [facility] hospital lab) Nasopharyngeal Nasopharyngeal Swab     Status: None   Collection Time: 12/01/19  7:43 PM   Specimen: Nasopharyngeal Swab  Result Value Ref Range Status   SARS Coronavirus 2 NEGATIVE NEGATIVE Final    Comment: (NOTE) SARS-CoV-2 target nucleic acids are NOT DETECTED.  The SARS-CoV-2 RNA is generally detectable in upper and lower respiratory specimens during the acute phase of infection. The lowest concentration of SARS-CoV-2 viral copies this assay can detect is 250 copies / mL. A negative result does not preclude SARS-CoV-2 infection and should not be used as the sole basis for treatment or other patient management decisions.  A negative result may occur with improper specimen collection / handling, submission of specimen  other than nasopharyngeal swab, presence of viral mutation(s) within the areas targeted by this assay, and inadequate number of viral copies (<250 copies / mL). A negative result must be combined with clinical observations, patient history, and epidemiological information.  Fact Sheet for Patients:   StrictlyIdeas.no  Fact Sheet for Healthcare Providers: BankingDealers.co.za  This test is not yet approved or  cleared by the [place] FDA and has been authorized for detection and/or diagnosis of SARS-CoV-2 by FDA under an Emergency Use Authorization (EUA).  This EUA will remain in effect (meaning this test can be used) for the duration of the COVID-19 declaration under Section 564(b)(1) of the Act, 21 U.S.C. section 360bbb-3(b)(1), unless the authorization is terminated or revoked sooner.  Performed at [facility], [place] [address], [place], [place] [address]  Labs: BNP (last 3 results) Recent Labs    09/08/19 1515  BNP <1.6   Basic Metabolic Panel: Recent Labs  Lab 12/01/19 1943 12/02/19 0421 12/03/19 0125  NA 139 137 137  K 3.9 4.6 4.2  CL 104 105 103  CO2 _0 GLUCOSE 120* 224* 124*  BUN _1 CREATININE 1.31* 1.33* 1.26*  CALCIUM 8.8* 8.6* 8.6*   Liver Function Tests: No results for input(s): AST, ALT, ALKPHOS, BILITOT, PROT, ALBUMIN in the last 168 hours. No results for input(s): LIPASE, AMYLASE in the last 168 hours. No results for input(s): AMMONIA in the last 168 hours. CBC: Recent Labs  Lab 12/01/19 1943 12/02/19 0421 12/03/19 0125  WBC 5.8 4.7 13.3*  NEUTROABS 2.6  --   --   HGB 15.6 14.5 14.1  HCT 48.0 44.4 42.3  MCV 89.9 89.0 88.1  PLT 246 233 232   Cardiac Enzymes: No results for input(s): CKTOTAL, CKMB, CKMBINDEX, TROPONINI in the last 168 hours. BNP: Invalid input(s): POCBNP CBG: Recent Labs  Lab 12/02/19 1130 12/02/19 1725 12/02/19 2101 12/03/19 0642  12/03/19 1145  GLUCAP 154* 205* 113* 107* 152*   D-Dimer No results for input(s): DDIMER in the last 72 hours. Hgb A1c Recent Labs    12/02/19 0421  HGBA1C 6.3*   Lipid Profile No results for input(s): CHOL, HDL, LDLCALC, TRIG, CHOLHDL, LDLDIRECT in the last 72 hours. Thyroid function studies No results for input(s): TSH, T4TOTAL, T3FREE, THYROIDAB in the last 72 hours.  Invalid input(s): FREET3 Anemia work up No results for input(s): VITAMINB12, FOLATE, FERRITIN, TIBC, IRON, RETICCTPCT in the last 72 hours. Urinalysis    Component Value Date/Time   BILIRUBINUR negative 07/16/2016 1158   PROTEINUR negatuve 07/16/2016 1158   UROBILINOGEN 0.2 07/16/2016 1158   NITRITE negatuive 07/16/2016 1158   LEUKOCYTESUR Negative 07/16/2016 1158   Sepsis Labs Invalid input(s): PROCALCITONIN,  WBC,  LACTICIDVEN Microbiology Recent Results (from the past 240 hour(s))  SARS Coronavirus 2 by RT PCR (hospital order, performed in [facility] hospital lab) Nasopharyngeal Nasopharyngeal Swab     Status: None   Collection Time: 12/01/19  7:43 PM   Specimen: Nasopharyngeal Swab  Result Value Ref Range Status   SARS Coronavirus 2 NEGATIVE NEGATIVE Final    Comment: (NOTE) SARS-CoV-2 target nucleic acids are NOT DETECTED.  The SARS-CoV-2 RNA is generally detectable in upper and lower respiratory specimens during the acute phase of infection. The lowest concentration of SARS-CoV-2 viral copies this assay can detect is 250 copies / mL. A negative result does not preclude SARS-CoV-2 infection and should not be used as the sole basis for treatment or other patient management decisions.  A negative result may occur with improper specimen collection / handling, submission of specimen other than nasopharyngeal swab, presence of viral mutation(s) within the areas targeted by this assay, and inadequate number of viral copies (<250 copies / mL). A negative result must be combined with  clinical observations, patient history, and epidemiological information.  Fact Sheet for Patients:   StrictlyIdeas.no  Fact Sheet for Healthcare Providers: BankingDealers.co.za  This test is not yet approved or  cleared by the [place] FDA and has been authorized for detection and/or diagnosis of SARS-CoV-2 by FDA under an Emergency Use Authorization (EUA).  This EUA will remain in effect (meaning this test can be used) for the duration of the COVID-19 declaration under Section 564(b)(1) of the Act, 21 U.S.C. section 360bbb-3(b)(1), unless the authorization is terminated or revoked  sooner.  Performed at [facility], [place] [address] [place], [place] [address]      Time coordinating discharge: Over 30 minutes  SIGNED:   Nicolette Bang, MD  [facility] 12/03/2019, 3:43 PM Pager   If 7PM-7AM, please contact night-coverage [email or web address] Password [email or web address]

## 2019-12-03 NOTE — Care Management Obs Status (Signed)
[place] NOTIFICATION   Patient Details  Name: Nichael Ehly MRN: [id number] Date of Birth: Sep 24, 1960   Medicare Observation Status Notification Given:  Yes    Claudie Leach, RN 12/03/2019, 8:40 AM

## 2019-12-03 NOTE — Plan of Care (Signed)

## 2019-12-05 ENCOUNTER — Telehealth: Payer: Self-pay

## 2019-12-05 NOTE — Telephone Encounter (Signed)
Transition Care Management Follow-up Telephone Call  The patient was not home at the time of the call.   His wife, Levada Dy,  completed the call with this CM.    Date of discharge and from where: 12/03/2019, [facility]   How have you been since you were released from the hospital? His wife stated that he is doing better however he still has a nagging cough and his stomach hurts due to the coughing  Any questions or concerns?  Levada Dy said that he was given new inhalers when he was discharged but she was not sure which ones or how many.  She will need to check with him when he gets home and have him call this CM if he has questions. .  There are no new inhalers listed on his AVS  Items Reviewed:  Did the pt receive and understand the discharge instructions provided?  instructed his wife to call this clinic with any questions   Medications obtained and verified?  his wife said she would check with him to make sure that he has the prednisone and she will have him call this clinic with any questions about his med regime.   Any new allergies since your discharge?  none reported   Do you have support at home?  his wife.  No home health or DME ordered.  Has glucometer and nebulizer. His wife mentioned that the nebulizer may not be working and he will need a new one.   Functional Questionnaire: (I = Independent and D = Dependent) ADLs: independent  Follow up appointments reviewed:   PCP Hospital f/u appt confirmed? Dr Wynetta Emery  12/15/2019  [facility] f/u appt confirmed?.pulmonary - 12/16/2019  If their condition worsens, is the pt aware to call PCP or go to the Emergency Dept.?  yes  Was the patient provided with contact information for the PCP's office or ED?  he has the clinic phone number  Was to pt encouraged to call back with questions or concerns? yes

## 2019-12-05 NOTE — Telephone Encounter (Signed)
Patient approved for patient assistance and ready to schedule shipment and first dose appointment.

## 2019-12-12 ENCOUNTER — Encounter: Payer: Self-pay | Admitting: Primary Care

## 2019-12-12 ENCOUNTER — Other Ambulatory Visit: Payer: Self-pay

## 2019-12-12 ENCOUNTER — Ambulatory Visit (INDEPENDENT_AMBULATORY_CARE_PROVIDER_SITE_OTHER): Payer: Medicare Other | Admitting: Primary Care

## 2019-12-12 DIAGNOSIS — J8283 Eosinophilic asthma: Secondary | ICD-10-CM | POA: Diagnosis not present

## 2019-12-12 NOTE — Progress Notes (Signed)
_0  ID: Christian [name], male    DOB: [birth year], 59 y.o.   MRN: [id number]  Chief Complaint  Patient presents with  . Follow-up    Hospital f/u - Feeling better - Finished abx this am - Denies cough or chest discomfort    Referring provider: Ladell Pier, MD  HPI: 59 year old male, passive smoke exposure/never smoked.  Past medical history significant for sever eosinophilic asthma, OSA on CPAP, hypertension, diabetes mellitus, hyperlipidemia, chronic pain syndrome.  Patient of Dr. Ander Slade, last seen by pulmonary nurse practitioner on 10/14/2019 for sinusitis.   Patient recently admitted from Aug 5th - Aug 7th, 2021 for COPD/asthma exacerbation. Breathing acutely worse over several days with wheezing.  Patient was seen by his primary care provider and started on antibiotic.  Symptoms persisted and patient ran out of Advair last month.  He was given Solu-Medrol and DuoNeb in route to ED.  Chest x-ray was negative.  Pulmonary consulted inpatient. Patient to continue Dulera, Incruse, prednisone and amoxicillin.  Patient was educated on medication compliance.  Patient was weaned off oxygen and stable for discharge home.  12/12/2019- interim hx Patient presents today for hospital follow-up. He has been using Dulera and incruse. He received his first nucala injection on August 5th at his home. He was already not feeling well and was headed to the emergency room. His wife gave him his injection at home before EMS was called. He is feeling a lot better since he was discharged. He has a lot of confusion regarding his medications. He was previously using Advair , he was sent home with [facility] and Incruse. He denies current shortness of breath, cough or wheezing.     Labs: 12/01/19- Eos 700 10/06/19- IgE 273 10/06/19- Eos 400   Pulmonary function testing: 05/25/2019-FVC 3.33 (89%), FEV1 2.46 (84%), ratio 74, DLCO 1 correlated 23.44 (89%) Interpretation: Mild restriction with borderline  bronchodilator response  Allergies  Allergen Reactions  . Phenergan [Promethazine Hcl] Itching and Other (See Comments)    burning    Immunization History  Administered Date(s) Administered  . Influenza Inj Mdck Quad Pf 05/29/2018  . Influenza Split 02/23/2016  . Influenza,inj,Quad PF,6+ Mos 12/22/2016, 02/10/2019  . Influenza-Unspecified 02/07/2019  . PFIZER SARS-COV-2 Vaccination 07/14/2019, 08/10/2019  . Pneumococcal Conjugate-13 03/25/2016  . Pneumococcal Polysaccharide-23 07/16/2016  . Tdap 04/02/2016    Past Medical History:  Diagnosis Date  . Allergy   . Arthritis    "left knee" (01/22/2016)  . Asthma   . Chronic lower back pain   . COPD (chronic obstructive pulmonary disease) ([place])   . Dyspnea    with extertion  . High cholesterol   . Hypertension   . Neuropathy   . OSA on CPAP   . Secondhand smoke exposure   . Sleep apnea    wears CPAP, not wearing now  for months  . Type II diabetes mellitus ([place]) dx'd 01/17/2016    Tobacco History: Social History   Tobacco Use  Smoking Status Passive Smoke Exposure - Never Smoker  Smokeless Tobacco Never Used  Tobacco Comment   passive cig smoking at work  1994- 2012   Counseling given: Not Answered Comment: passive cig smoking at work  1994- 2012   Outpatient Medications Prior to Visit  Medication Sig Dispense Refill  . ACCU-CHEK GUIDE test strip USE 1  TEST STRIP ONCE DAILY TO CHECK BLOOD SUGAR 100 each 1  . albuterol (VENTOLIN HFA) 108 (90 Base) MCG/ACT inhaler INHALE 1 TO 2 PUFFS BY  MOUTH EVERY 6 HOURS AS NEEDED FOR WHEEZING FOR SHORTNESS OF BREATH (Patient taking differently: Inhale 1-2 puffs into the lungs every 6 (six) hours as needed for wheezing or shortness of breath. ) 9 g 11  . amLODipine (NORVASC) 10 MG tablet Take 1 tablet (10 mg total) by mouth daily. 90 tablet 3  . Blood Glucose Monitoring Suppl (ONE TOUCH ULTRA 2) w/Device KIT Use as directed 3 times daily E11.9 1 each 0  . cetirizine (ZYRTEC) 10 MG  tablet Take 1 tablet (10 mg total) by mouth daily. 30 tablet 11  . gabapentin (NEURONTIN) 300 MG capsule Take 1 capsule (300 mg total) by mouth 3 (three) times daily. 90 capsule 3  . ipratropium-albuterol (DUONEB) 0.5-2.5 (3) MG/3ML SOLN Take 3 mLs by nebulization every 4 (four) hours as needed. (Patient taking differently: Take 3 mLs by nebulization every 4 (four) hours as needed (for breathing). ) 360 mL 2  . lisinopril-hydrochlorothiazide (ZESTORETIC) 10-12.5 MG tablet Take 1 tablet by mouth daily. 90 tablet 2  . Mepolizumab (NUCALA) 100 MG SOLR Inject 100 mg into the skin every 28 (twenty-eight) days. 1 each 12  . metFORMIN (GLUCOPHAGE) 500 MG tablet Take 1 tablet (500 mg total) by mouth every morning. (Patient taking differently: Take 500 mg by mouth daily with breakfast. ) 90 tablet 1  . montelukast (SINGULAIR) 10 MG tablet Take 1 tablet (10 mg total) by mouth at bedtime. 90 tablet 1  . pravastatin (PRAVACHOL) 20 MG tablet Take 1 tablet (20 mg total) by mouth at bedtime. 90 tablet 0  . amoxicillin (AMOXIL) 500 MG capsule Take 1 capsule (500 mg total) by mouth 3 (three) times daily. (Patient not taking: Reported on 12/12/2019) 30 capsule 0  . Fluticasone-Salmeterol (ADVAIR DISKUS) 500-50 MCG/DOSE AEPB Inhale 1 puff into the lungs 2 (two) times daily. (Patient not taking: Reported on 12/01/2019) 60 each 3  . furosemide (LASIX) 20 MG tablet Take 1 tablet (20 mg total) by mouth daily. X 1 week then prn swelling (Patient not taking: Reported on 12/12/2019) 30 tablet 0  . TRUEPLUS LANCETS 28G MISC Check glucose 2 times a day (Patient not taking: Reported on 12/12/2019) 100 each 3   No facility-administered medications prior to visit.   Review of Systems  Review of Systems  Constitutional: Negative.   Respiratory: Negative.   Cardiovascular: Negative.    Physical Exam  BP 118/60   Pulse 74   Temp 98 F (36.7 C) (Oral)   Ht _0  (1.778 m)   Wt 191 lb 3.2 oz (86.7 kg)   SpO2 98%   BMI 27.43  kg/m  Physical Exam Constitutional:      Appearance: Normal appearance.  HENT:     Head: Normocephalic and atraumatic.  Cardiovascular:     Rate and Rhythm: Normal rate and regular rhythm.  Pulmonary:     Effort: Pulmonary effort is normal.     Breath sounds: No wheezing or rhonchi.  Neurological:     General: No focal deficit present.     Mental Status: He is alert and oriented to person, place, and time. Mental status is at baseline.  Psychiatric:        Mood and Affect: Mood normal.        Behavior: Behavior normal.        Thought Content: Thought content normal.        Judgment: Judgment normal.      Lab Results:  CBC    Component Value Date/Time  WBC 13.3 (H) 12/03/2019 0125   RBC 4.80 12/03/2019 0125   HGB 14.1 12/03/2019 0125   HGB 16.2 08/19/2018 1441   HCT 42.3 12/03/2019 0125   HCT 48.8 08/19/2018 1441   PLT 232 12/03/2019 0125   PLT 266 08/19/2018 1441   MCV 88.1 12/03/2019 0125   MCV 88 08/19/2018 1441   MCH 29.4 12/03/2019 0125   MCHC 33.3 12/03/2019 0125   RDW 13.6 12/03/2019 0125   RDW 13.3 08/19/2018 1441   LYMPHSABS 1.7 12/01/2019 1943   LYMPHSABS 2.1 08/19/2018 1441   MONOABS 0.6 12/01/2019 1943   EOSABS 0.7 (H) 12/01/2019 1943   EOSABS 0.5 (H) 08/19/2018 1441   BASOSABS 0.1 12/01/2019 1943   BASOSABS 0.1 08/19/2018 1441    BMET    Component Value Date/Time   NA 137 12/03/2019 0125   NA 140 09/08/2019 1515   K 4.2 12/03/2019 0125   CL 103 12/03/2019 0125   CO2 23 12/03/2019 0125   GLUCOSE 124 (H) 12/03/2019 0125   BUN 15 12/03/2019 0125   BUN 11 09/08/2019 1515   CREATININE 1.26 (H) 12/03/2019 0125   CREATININE 1.09 01/14/2016 1523   CALCIUM 8.6 (L) 12/03/2019 0125   GFRNONAA >60 12/03/2019 0125   GFRNONAA 75 01/14/2016 1523   GFRAA >60 12/03/2019 0125   GFRAA 87 01/14/2016 1523    BNP    Component Value Date/Time   BNP <2.5 09/08/2019 1515    ProBNP No results found for: PROBNP  Imaging: DG Abd 1 View  Result Date:  12/03/2019 CLINICAL DATA:  Abdominal pain. EXAM: ABDOMEN - 1 VIEW COMPARISON:  None. FINDINGS: There is moderate stool within the ascending colon. Otherwise bowel loops are normal in caliber. No free intraperitoneal air identified. No evidence for organomegaly. Convex LEFT scoliosis of the lumbar spine is associated with significant degenerative changes. IMPRESSION: Moderate stool burden. Electronically Signed   By: Nolon Nations M.D.   On: 12/03/2019 10:28   CT ABDOMEN PELVIS W CONTRAST  Result Date: 12/03/2019 CLINICAL DATA:  Left upper quadrant pain EXAM: CT ABDOMEN AND PELVIS WITH CONTRAST TECHNIQUE: Multidetector CT imaging of the abdomen and pelvis was performed using the standard protocol following bolus administration of intravenous contrast. CONTRAST:  172m OMNIPAQUE IOHEXOL 300 MG/ML  SOLN COMPARISON:  None. FINDINGS: Lower chest: No acute abnormality. Hepatobiliary: Small hypodensity in the right hepatic lobe measures 13 mm. There appears to be some peripheral contrast enhancement which suggest this could be a hemangioma. Gallbladder contracted, grossly unremarkable. Pancreas: No focal abnormality or ductal dilatation. Spleen: No focal abnormality.  Normal size. Adrenals/Urinary Tract: Left renal parapelvic cysts. Left lower pole cyst. No hydronephrosis. Adrenal glands and urinary bladder unremarkable. Stomach/Bowel: Normal appendix. Few scattered sigmoid diverticula. No active diverticulitis. Stomach and small bowel decompressed, unremarkable. Vascular/Lymphatic: No evidence of aneurysm or adenopathy. Reproductive: No visible focal abnormality. Other: No free fluid or free air. Small umbilical hernia containing fat. Musculoskeletal: No acute bony abnormality. Degenerative changes in the lumbar spine. IMPRESSION: No acute findings in the abdomen or pelvis. 13 mm hypodensity within the right hepatic lobe, favor hemangioma. Small umbilical hernia containing fat. Electronically Signed   By: KRolm BaptiseM.D.   On: 12/03/2019 15:11   DG Chest Portable 1 View  Result Date: 12/01/2019 CLINICAL DATA:  Dyspnea.  History of COPD. EXAM: PORTABLE CHEST 1 VIEW COMPARISON:  September 29, 2019 FINDINGS: The heart size and mediastinal contours are within normal limits. Both lungs are clear. The visualized skeletal structures are  unremarkable. IMPRESSION: No active disease. Electronically Signed   By: Constance Holster M.D.   On: 12/01/2019 20:16     Assessment & Plan:   Eosinophilic asthma - Patient admitted to hospital with acute asthma exacerbation, he ran out fo Advair 1 month prior. He took his first Nucala injection at home on the day he presented to ED. He did NOT have a reaction to biologic, prior to receiving his first injection of Nucala he had already called EMS for ED evaluation. Patient was discharged on [place]. He has a fair amount of confusion regarding his asthma medication. He is currently on triple therapy and would do better with a simpler inhaler regimen. Discussed with our pharmacist and they will process a benefits investigation. We will also ensure patient's next [place] injection will be in office with pharmacist. He will be scheduled for apt on September 2nd at 2:20pm. He understands if medication is delivered to his house not to take injection and bring to pharmacy appointment.    PLAN: - Benefits investigation for triple inhaler therapy - Set up apt with pharmacy for second [place] injection     Martyn Ehrich, NP 12/12/2019

## 2019-12-12 NOTE — Assessment & Plan Note (Addendum)
-   Patient admitted to hospital with acute asthma exacerbation, he ran out fo Advair 1 month prior. He took his first Nucala injection at home on the day he presented to ED. He did NOT have a reaction to biologic, prior to receiving his first injection of Nucala he had already called EMS for ED evaluation. Patient was discharged on [place]. He has a fair amount of confusion regarding his asthma medication. He is currently on triple therapy and would do better with a simpler inhaler regimen. Discussed with our pharmacist and they will process a benefits investigation. We will also ensure patient's next [place] injection will be in office with pharmacist. He will be scheduled for apt on September 2nd at 2:20pm. He understands if medication is delivered to his house not to take injection and bring to pharmacy appointment.    PLAN: - Benefits investigation for triple inhaler therapy - Set up apt with pharmacy for second [place] injection

## 2019-12-12 NOTE — Patient Instructions (Addendum)
Nice seeing you today Christian [name], I'm glad you are feeling better   Recommendations: - Continue Dulera OR Advair twice daily  - Continue Incruse once daily  * We will run a benefits investigation for triple therapy (which is all three medication in ONE inhaler)  Follow-up: - We have scheduled you for your next injection with our pharamacist on September 2nd at 2pm  - Needs follow-up in 3 months with Dr. Ander Slade

## 2019-12-15 ENCOUNTER — Other Ambulatory Visit: Payer: Self-pay

## 2019-12-15 ENCOUNTER — Ambulatory Visit: Payer: Medicare Other | Attending: Internal Medicine | Admitting: Internal Medicine

## 2019-12-15 ENCOUNTER — Encounter: Payer: Self-pay | Admitting: Internal Medicine

## 2019-12-15 VITALS — BP 125/80 | HR 67 | Temp 98.5°F | Resp 16 | Wt 196.4 lb

## 2019-12-15 DIAGNOSIS — I10 Essential (primary) hypertension: Secondary | ICD-10-CM | POA: Diagnosis not present

## 2019-12-15 DIAGNOSIS — R600 Localized edema: Secondary | ICD-10-CM

## 2019-12-15 DIAGNOSIS — J8283 Eosinophilic asthma: Secondary | ICD-10-CM | POA: Diagnosis not present

## 2019-12-15 DIAGNOSIS — Z09 Encounter for follow-up examination after completed treatment for conditions other than malignant neoplasm: Secondary | ICD-10-CM

## 2019-12-15 DIAGNOSIS — E119 Type 2 diabetes mellitus without complications: Secondary | ICD-10-CM | POA: Diagnosis not present

## 2019-12-15 LAB — GLUCOSE, POCT (MANUAL RESULT ENTRY): POC Glucose: 105 mg/dl — AB (ref 70–99)

## 2019-12-15 MED ORDER — HYDROCHLOROTHIAZIDE 12.5 MG PO TABS: 12.5000 mg | ORAL_TABLET | Freq: Every day | ORAL | 3 refills | Status: DC

## 2019-12-15 NOTE — Patient Instructions (Addendum)
You have significant swelling in your legs.  This can be due to amlodipine or gabapentin.  We will have you stop the amlodipine for now.  Continue lisinopril/HCTZ 10 mg/12.5 mg.  We have added a low-dose of hydrochlorothiazide 12.5 mg daily.  Continue to monitor your blood pressure at least twice a week.  The goal is 130/80 or lower.

## 2019-12-15 NOTE — Progress Notes (Signed)
Patient ID: Christian [name], male    DOB: Sep 09, 1960  MRN: [id number]  CC: Transition of care Date of Hosp: Date of call from Care management:  12/05/2019  Subjective: Christian [name] is a 59 y.o. male who presents for transition of care visit His concerns today include:  History of HTN, DM type II, HL, moderate persistent asthma, vit D def, OSA on CPAP.  Patient hospitalized with acute hypoxic respiratory failure secondary to severe persistent asthma with acute exacerbation.  Patient had reportedly ran out of Advair about a month prior to presentation.  Patient improved with treatment and was able to be weaned off oxygen.  Seen by pulmonary.  He was discharged home with Dulera, Incruse, prednisone and amoxicillin.  He was continue his nebulizer treatments as needed. [facility] course complicated by abdominal pain.  KUB was notable for moderate stools.  CAT scan of the abdomen negative for any acute findings. -His A1c was 6.3.  He was continued on Metformin and his home blood pressure medications.  Today: Asthma -patient reports his breathing is much better.   -Since discharge, he has seen the pulmonary nurse practitioner in follow-up visit.  Plan is to try to simplify his inhaler regimen by getting him on trilogy.  Patient states that they are awaiting approval from his insurance.  In the meantime he continues to use Dulera and Incruse inhalers which he was given from the hospital to take home at the time of discharge.  He has refill at his pharmacy for the Advair for when he runs out of the [place].  Hopefully by that time he would know whether the trilogy has been approved by his insurance.  He uses Ventolin inhaler once a day in the mornings.  DM: Checks his blood sugar every morning.  He does not have a log with him but reports that the blood sugars have been in the low 100s.  Reports compliance with Metformin.  HTN: Reports compliance with amlodipine and lisinopril/HCTZ.  He tells me that he  checked his blood pressure this morning before coming in and it was 127/73.  He limits salt in the foods.  Endorses lower extremity edema which usually goes away as long as he keeps his legs elevated. Patient Active Problem List   Diagnosis Date Noted   Severe persistent asthma with acute exacerbation 12/01/2019   Medication management 07/21/2019   Nasal polyps 06/21/2019   Allergic rhinitis 04/05/2019   OSA (obstructive sleep apnea) 04/05/2019   Presence of left artificial knee joint 59/16/3846   Eosinophilic asthma 65/99/3570   Primary osteoarthritis of left knee 02/23/2017   Chronic pain syndrome 04/02/2016   Intrinsic asthma 03/25/2016   Essential hypertension 01/23/2016   Hyperlipidemia 01/23/2016   Secondhand smoke exposure 01/23/2016   Diabetes mellitus with neuropathy ([place]) 01/23/2016     Current Outpatient Medications on File Prior to Visit  Medication Sig Dispense Refill   ACCU-CHEK GUIDE test strip USE 1  TEST STRIP ONCE DAILY TO CHECK BLOOD SUGAR 100 each 1   albuterol (VENTOLIN HFA) 108 (90 Base) MCG/ACT inhaler INHALE 1 TO 2 PUFFS BY MOUTH EVERY 6 HOURS AS NEEDED FOR WHEEZING FOR SHORTNESS OF BREATH (Patient taking differently: Inhale 1-2 puffs into the lungs every 6 (six) hours as needed for wheezing or shortness of breath. ) 9 g 11   Blood Glucose Monitoring Suppl (ONE TOUCH ULTRA 2) w/Device KIT Use as directed 3 times daily E11.9 1 each 0   cetirizine (ZYRTEC) 10 MG tablet  Take 1 tablet (10 mg total) by mouth daily. 30 tablet 11   Fluticasone-Salmeterol (ADVAIR DISKUS) 500-50 MCG/DOSE AEPB Inhale 1 puff into the lungs 2 (two) times daily. (Patient not taking: Reported on 12/01/2019) 60 each 3   gabapentin (NEURONTIN) 300 MG capsule Take 1 capsule (300 mg total) by mouth 3 (three) times daily. 90 capsule 3   ipratropium-albuterol (DUONEB) 0.5-2.5 (3) MG/3ML SOLN Take 3 mLs by nebulization every 4 (four) hours as needed. (Patient taking differently: Take  3 mLs by nebulization every 4 (four) hours as needed (for breathing). ) 360 mL 2   lisinopril-hydrochlorothiazide (ZESTORETIC) 10-12.5 MG tablet Take 1 tablet by mouth daily. 90 tablet 2   Mepolizumab (NUCALA) 100 MG SOLR Inject 100 mg into the skin every 28 (twenty-eight) days. 1 each 12   metFORMIN (GLUCOPHAGE) 500 MG tablet Take 1 tablet (500 mg total) by mouth every morning. (Patient taking differently: Take 500 mg by mouth daily with breakfast. ) 90 tablet 1   montelukast (SINGULAIR) 10 MG tablet Take 1 tablet (10 mg total) by mouth at bedtime. 90 tablet 1   pravastatin (PRAVACHOL) 20 MG tablet Take 1 tablet (20 mg total) by mouth at bedtime. 90 tablet 0   TRUEPLUS LANCETS 28G MISC Check glucose 2 times a day (Patient not taking: Reported on 12/12/2019) 100 each 3   No current facility-administered medications on file prior to visit.    Allergies  Allergen Reactions   Phenergan [Promethazine Hcl] Itching and Other (See Comments)    burning    Social History   Socioeconomic History   Marital status: Married    Spouse name: Not on file   Number of children: Not on file   Years of education: Not on file   Highest education level: Not on file  Occupational History   Not on file  Tobacco Use   Smoking status: Passive Smoke Exposure - Never Smoker   Smokeless tobacco: Never Used   Tobacco comment: passive cig smoking at work  1994- 2012  Vaping Use   Vaping Use: Never used  Substance and Sexual Activity   Alcohol use: No   Drug use: No   Sexual activity: Yes  Other Topics Concern   Not on file  Social History Narrative   Not on file   Social Determinants of Health   Financial Resource Strain:    Difficulty of Paying Living Expenses: Not on file  Food Insecurity:    Worried About Charity fundraiser in the Last Year: Not on file   [employer] of Food in the Last Year: Not on file  Transportation Needs:    Lack of Transportation (Medical): Not on  file   Lack of Transportation (Non-Medical): Not on file  Physical Activity:    Days of Exercise per Week: Not on file   Minutes of Exercise per Session: Not on file  Stress:    Feeling of Stress : Not on file  Social Connections:    Frequency of Communication with Friends and Family: Not on file   Frequency of Social Gatherings with Friends and Family: Not on file   Attends Religious Services: Not on file   Active Member of Clubs or Organizations: Not on file   Attends Archivist Meetings: Not on file   Marital Status: Not on file  Intimate Partner Violence:    Fear of Current or Ex-Partner: Not on file   Emotionally Abused: Not on file   Physically Abused: Not on  file   Sexually Abused: Not on file    Family History  Problem Relation Age of Onset   Hypertension Mother    Hypertension Father    Heart Problems Brother        Heart transplant, pt does not know cause    Rectal cancer Brother    Colon cancer Neg Hx    Esophageal cancer Neg Hx    Stomach cancer Neg Hx     Past Surgical History:  Procedure Laterality Date   ANTERIOR CERVICAL DECOMP/DISCECTOMY FUSION  ~ 1995   "titanium"   APPENDECTOMY  ~ [place]     x 2  L 4disectomy   COLONOSCOPY W/ POLYPECTOMY     INGUINAL HERNIA REPAIR Right ~ [place]  ~ 2000; ~2006   NASAL SINUS SURGERY  ~ 2007   TOTAL KNEE ARTHROPLASTY Left 08/06/2017   Procedure: LEFT TOTAL KNEE ARTHROPLASTY;  Surgeon: Leandrew Koyanagi, MD;  Location: [place];  Service: Orthopedics;  Laterality: Left;    ROS: Review of Systems Negative except as stated above  PHYSICAL EXAM: BP 125/80    Pulse 67    Temp 98.5 F (36.9 C)    Resp 16    Wt 196 lb 6.4 oz (89.1 kg)    SpO2 98%    BMI 28.18 kg/m   Physical Exam General appearance - alert, well appearing, and in no distress Mental status - normal mood, behavior, speech, dress, motor activity, and thought processes Neck - supple, no  significant adenopathy Chest -breath sounds decreased but no wheezes or crackles heard. Heart - normal rate, regular rhythm, normal S1, S2, no murmurs, rubs, clicks or gallops Extremities -1+ bilateral lower extremity and ankle edema  Lab Results  Component Value Date   HGBA1C 6.3 (H) 12/02/2019    CMP Latest Ref Rng & Units 12/03/2019 12/02/2019 12/01/2019  Glucose 70 - 99 mg/dL 124(H) 224(H) 120(H)  BUN 6 - 20 mg/dL 15 12 9   Creatinine 0.61 - 1.24 mg/dL 1.26(H) 1.33(H) 1.31(H)  Sodium 135 - 145 mmol/L 137 137 139  Potassium 3.5 - 5.1 mmol/L 4.2 4.6 3.9  Chloride 98 - 111 mmol/L 103 105 104  CO2 22 - 32 mmol/L 23 23 27   Calcium 8.9 - 10.3 mg/dL 8.6(L) 8.6(L) 8.8(L)  Total Protein 6.0 - 8.5 g/dL - - -  Total Bilirubin 0.0 - 1.2 mg/dL - - -  Alkaline Phos 39 - 117 IU/L - - -  AST 0 - 40 IU/L - - -  ALT 0 - 44 IU/L - - -   Lipid Panel     Component Value Date/Time   CHOL 167 09/14/2018 1047   TRIG 43 09/14/2018 1047   HDL 63 09/14/2018 1047   CHOLHDL 2.7 09/14/2018 1047   CHOLHDL 2.4 03/05/2016 0824   VLDL 12 03/05/2016 0824   LDLCALC 95 09/14/2018 1047    CBC    Component Value Date/Time   WBC 13.3 (H) 12/03/2019 0125   RBC 4.80 12/03/2019 0125   HGB 14.1 12/03/2019 0125   HGB 16.2 08/19/2018 1441   HCT 42.3 12/03/2019 0125   HCT 48.8 08/19/2018 1441   PLT 232 12/03/2019 0125   PLT 266 08/19/2018 1441   MCV 88.1 12/03/2019 0125   MCV 88 08/19/2018 1441   MCH 29.4 12/03/2019 0125   MCHC 33.3 12/03/2019 0125   RDW 13.6 12/03/2019 0125   RDW 13.3 08/19/2018 1441   LYMPHSABS 1.7 12/01/2019  1943   LYMPHSABS 2.1 08/19/2018 1441   MONOABS 0.6 12/01/2019 1943   EOSABS 0.7 (H) 12/01/2019 1943   EOSABS 0.5 (H) 08/19/2018 1441   BASOSABS 0.1 12/01/2019 1943   BASOSABS 0.1 08/19/2018 1441    ASSESSMENT AND PLAN: 1. Hospital discharge follow-up 2. Eosinophilic asthma Followed by pulmonary.  The plan is to try to get him on something like trilogy to simplify his inhaler  regiment.  In the meantime he will continue the [place].  Once he is out of the [facility] he will fill the Advair.  Continue nebulizer treatments as needed.  He is also started the Nucala through pulmonary  3. Type 2 diabetes mellitus without complication, without long-term current use of insulin (HCC) At goal.  Continue Metformin and healthy eating habits. - POCT glucose (manual entry)  4. Essential hypertension At goal.  However given his lower extremity edema, we will give a trial of stopping amlodipine.  He will continue the lisinopril/hydrochlorothiazide 10/12.5 mg.  We have added a low-dose of hydrochlorothiazide with this.  Follow-up in 1 month for recheck blood pressure and to see whether the swelling in his legs are better. - Basic Metabolic Panel - CBC - hydrochlorothiazide (HYDRODIURIL) 12.5 MG tablet; Take 1 tablet (12.5 mg total) by mouth daily.  Dispense: 90 tablet; Refill: 3  5. Edema of both lower legs See plan above.  If swelling no better with stopping the amlodipine, the next step would be stopping or decreasing the frequency of the gabapentin.    Patient was given the opportunity to ask questions.  Patient verbalized understanding of the plan and was able to repeat key elements of the plan.   Orders Placed This Encounter  Procedures   Basic Metabolic Panel   CBC   POCT glucose (manual entry)     Requested Prescriptions   Signed Prescriptions Disp Refills   hydrochlorothiazide (HYDRODIURIL) 12.5 MG tablet 90 tablet 3    Sig: Take 1 tablet (12.5 mg total) by mouth daily.    Return in about 1 month (around 01/15/2020).  Karle Plumber, MD, FACP

## 2019-12-16 ENCOUNTER — Inpatient Hospital Stay: Payer: Medicare Other | Admitting: Pulmonary Disease

## 2019-12-16 LAB — BASIC METABOLIC PANEL
BUN/Creatinine Ratio: 11 (ref 9–20)
BUN: 12 mg/dL (ref 6–24)
CO2: 27 mmol/L (ref 20–29)
Calcium: 8.3 mg/dL — ABNORMAL LOW (ref 8.7–10.2)
Chloride: 102 mmol/L (ref 96–106)
Creatinine, Ser: 1.07 mg/dL (ref 0.76–1.27)
GFR calc Af Amer: 87 mL/min/{1.73_m2} (ref >59)
GFR calc non Af Amer: 76 mL/min/{1.73_m2} (ref >59)
Glucose: 76 mg/dL (ref 65–99)
Potassium: 3.8 mmol/L (ref 3.5–5.2)
Sodium: 142 mmol/L (ref 134–144)

## 2019-12-16 LAB — CBC
Hematocrit: 46.2 % (ref 37.5–51.0)
Hemoglobin: 15.5 g/dL (ref 13.0–17.7)
MCH: 30.2 pg (ref 26.6–33.0)
MCHC: 33.5 g/dL (ref 31.5–35.7)
MCV: 90 fL (ref 79–97)
Platelets: 243 10*3/uL (ref 150–450)
RBC: 5.14 x10E6/uL (ref 4.14–5.80)
RDW: 13.3 % (ref 11.6–15.4)
WBC: 6.7 10*3/uL (ref 3.4–10.8)

## 2019-12-17 ENCOUNTER — Other Ambulatory Visit: Payer: Self-pay | Admitting: Internal Medicine

## 2019-12-17 NOTE — Progress Notes (Signed)
Let pt know that his kidney function is good.  Calcium level is low which may be due to vitamin D deficiency or low parathyroid hormone level.  Please return to the lab at his convenience to have these levels checked.  Bloos count is normal meaning no anemia.

## 2019-12-24 ENCOUNTER — Telehealth: Payer: Self-pay

## 2019-12-24 NOTE — Telephone Encounter (Signed)
Contacted pt to go over lab results pt didn't answer lvm asking pt to give a call back on Monday to receive results

## 2019-12-27 ENCOUNTER — Ambulatory Visit: Payer: Medicare Other | Attending: Family Medicine

## 2019-12-27 ENCOUNTER — Other Ambulatory Visit: Payer: Self-pay

## 2019-12-27 DIAGNOSIS — E119 Type 2 diabetes mellitus without complications: Secondary | ICD-10-CM | POA: Diagnosis not present

## 2019-12-27 DIAGNOSIS — R600 Localized edema: Secondary | ICD-10-CM | POA: Diagnosis not present

## 2019-12-27 LAB — HM DIABETES EYE EXAM

## 2019-12-27 NOTE — Telephone Encounter (Signed)
Pt returned your call.  cb  [phone]

## 2019-12-27 NOTE — Telephone Encounter (Signed)
Returned pt call and went over lab results pt is coming in today for additional bloodwork

## 2019-12-28 ENCOUNTER — Telehealth: Payer: Self-pay

## 2019-12-28 ENCOUNTER — Other Ambulatory Visit: Payer: Self-pay | Admitting: Internal Medicine

## 2019-12-28 LAB — BRAIN NATRIURETIC PEPTIDE: BNP: 13.9 pg/mL (ref 0.0–100.0)

## 2019-12-28 LAB — PARATHYROID HORMONE, INTACT (NO CA): PTH: 57 pg/mL (ref 15–65)

## 2019-12-28 LAB — VITAMIN D 25 HYDROXY (VIT D DEFICIENCY, FRACTURES): Vit D, 25-Hydroxy: 12.4 ng/mL — ABNORMAL LOW (ref 30.0–100.0)

## 2019-12-28 MED ORDER — VITAMIN D (ERGOCALCIFEROL) 1.25 MG (50000 UNIT) PO CAPS
50000.0000 [IU] | ORAL_CAPSULE | ORAL | 1 refills | Status: DC
Start: 2019-12-28 — End: 2020-01-12

## 2019-12-28 NOTE — Progress Notes (Deleted)
HPI Patient presents today to [facility] to see pharmacy team for injection and inhaler education.  Past medical history includes asthma/COPD, OSA, nasal polyps, type 2 diabetes, HTN.  He was approved for Nucala and enrolled in patient assistance.  Patient received injections in the mail and administered first dose at home without training.  Number of hospitalizations in past year: Number of ***COPD/asthma exacerbations in past year:   Respiratory Medications Current: Advair Diskus, Duoneb, albuterol, cetirizine, montelukast Tried in past: *** Patient reports {Adherence challenges yes [id number]::"[id number] challenges","no known adherence challenges"}  OBJECTIVE Allergies  Allergen Reactions  . Phenergan [Promethazine Hcl] Itching and Other (See Comments)    burning    Outpatient Encounter Medications as of 12/29/2019  Medication Sig  . ACCU-CHEK GUIDE test strip USE 1  TEST STRIP ONCE DAILY TO CHECK BLOOD SUGAR  . albuterol (VENTOLIN HFA) 108 (90 Base) MCG/ACT inhaler INHALE 1 TO 2 PUFFS BY MOUTH EVERY 6 HOURS AS NEEDED FOR WHEEZING FOR SHORTNESS OF BREATH (Patient taking differently: Inhale 1-2 puffs into the lungs every 6 (six) hours as needed for wheezing or shortness of breath. )  . Blood Glucose Monitoring Suppl (ONE TOUCH ULTRA 2) w/Device KIT Use as directed 3 times daily E11.9  . cetirizine (ZYRTEC) 10 MG tablet Take 1 tablet (10 mg total) by mouth daily.  . Fluticasone-Salmeterol (ADVAIR DISKUS) 500-50 MCG/DOSE AEPB Inhale 1 puff into the lungs 2 (two) times daily. (Patient not taking: Reported on 12/01/2019)  . gabapentin (NEURONTIN) 300 MG capsule Take 1 capsule (300 mg total) by mouth 3 (three) times daily.  . hydrochlorothiazide (HYDRODIURIL) 12.5 MG tablet Take 1 tablet (12.5 mg total) by mouth daily.  [place] Kitchen ipratropium-albuterol (DUONEB) 0.5-2.5 (3) MG/3ML SOLN Take 3 mLs by nebulization every 4 (four) hours as needed. (Patient taking differently: Take 3 mLs by  nebulization every 4 (four) hours as needed (for breathing). )  . lisinopril-hydrochlorothiazide (ZESTORETIC) 10-12.5 MG tablet Take 1 tablet by mouth daily.  . Mepolizumab (NUCALA) 100 MG SOLR Inject 100 mg into the skin every 28 (twenty-eight) days.  . metFORMIN (GLUCOPHAGE) 500 MG tablet Take 1 tablet (500 mg total) by mouth every morning. (Patient taking differently: Take 500 mg by mouth daily with breakfast. )  . montelukast (SINGULAIR) 10 MG tablet Take 1 tablet (10 mg total) by mouth at bedtime.  . pravastatin (PRAVACHOL) 20 MG tablet Take 1 tablet (20 mg total) by mouth at bedtime.  . TRUEPLUS LANCETS 28G MISC Check glucose 2 times a day (Patient not taking: Reported on 12/12/2019)  . Vitamin D, Ergocalciferol, (DRISDOL) 1.25 MG (50000 UNIT) CAPS capsule Take 1 capsule (50,000 Units total) by mouth every 7 (seven) days.   No facility-administered encounter medications on file as of 12/29/2019.     Immunization History  Administered Date(s) Administered  . Influenza Inj Mdck Quad Pf 05/29/2018  . Influenza Split 02/23/2016  . Influenza,inj,Quad PF,6+ Mos 12/22/2016, 02/10/2019  . Influenza-Unspecified 02/07/2019  . PFIZER SARS-COV-2 Vaccination 07/14/2019, 08/10/2019  . Pneumococcal Conjugate-13 03/25/2016  . Pneumococcal Polysaccharide-23 07/16/2016  . Tdap 04/02/2016     PFTs PFT Results Latest Ref Rng & Units 05/25/2019 04/05/2019 05/07/2016  FVC-Pre L - 3.22 3.11  FVC-Predicted Pre % 86 86 82  FVC-Post L 3.33 3.33 2.46  FVC-Predicted Post % 89 89 64  Pre FEV1/FVC % % 68 68 58  Post FEV1/FCV % % 74 74 70  FEV1-Pre L 2.20 2.20 1.79  FEV1-Predicted Pre % 75 75 60  FEV1-Post L 2.46  2.46 1.73  DLCO uncorrected ml/min/mmHg 23.44 23.44 22.17  DLCO UNC% % 89 89 74  DLCO corrected ml/min/mmHg - - 20.65  DLCO COR %Predicted % - - 69  DLVA Predicted % 106 106 108  TLC L 5.63 5.63 5.42  TLC % Predicted % 85 85 82  RV % Predicted % 95 95 103     Eosinophils Most recent blood  eosinophil count was 0.7 cells/microL taken on 12/01/19.   IgE 273 kU/L 10/06/2018  Assessment   1. Biologics training (Nucala)  . Mepolizumab (Nucala) o MOA: Not fully understood. It does act an interleukin-5 (IL-5) antagonist monoclonal antibody that reduces the production and survival of eosinophils by blocking the binding of IL-5 to the alpha chain of the receptor complex on the eosinophil cell surface. o Response to therapy: ??? o Side effects: headache (19%), injection site reaxtion (7-15%), antibody development (6%), backache (5%), fatigue (5%) o Dosing: 100-300 mg subQ every 4 weeks o Administration:  1.  For the 300 mg dose, administer as 3 separate 100 mg injections into the upper arm, thigh, or abdomen ?5 cm (~2 inches) apart if >1 injection administered at same site.  2. Do not shake the reconstituted solution as this could lead to product foaming or precipitation. 3. The solution should be clear to opalescent and colorless to pale yellow or pale brown, essentially particle free. Small air bubbles, however, are expected and acceptable. If particulate matter remains in the solution or if the solution appears cloudy or milky, discard the solution.   2. Medication Reconciliation  A drug regimen assessment was performed, including review of allergies, interactions, disease-state management, dosing and immunization history. Medications were reviewed with the patient, including name, instructions, indication, goals of therapy, potential side effects, importance of adherence, and safe use.  Drug interaction(s): no major drug interactions  3. Immunizations  Patient is up to date with pneumonia, Covid-19, and Tdap.  Recommend annual influenza.  PLAN ***  All questions encouraged and answered.  Instructed patient to reach out with any further questions or concerns.  Thank you for allowing pharmacy to participate in this patient's care.  This appointment required *** minutes of  patient care (this includes precharting, chart review, review of results, face-to-face care, etc.).

## 2019-12-28 NOTE — Progress Notes (Signed)
Let patient know that his vitamin D level is low.  I have sent a prescription to his pharmacy at [facility] for vitamin D supplement that he will take once a week.

## 2019-12-28 NOTE — Telephone Encounter (Signed)
Contacted pt to go over lab results pt is aware and doesn't have any questions or concerns 

## 2019-12-29 ENCOUNTER — Other Ambulatory Visit: Payer: Medicare Other

## 2019-12-31 DIAGNOSIS — G4733 Obstructive sleep apnea (adult) (pediatric): Secondary | ICD-10-CM | POA: Diagnosis not present

## 2020-01-03 ENCOUNTER — Telehealth: Payer: Self-pay

## 2020-01-03 ENCOUNTER — Telehealth: Payer: Self-pay | Admitting: Pulmonary Disease

## 2020-01-03 MED ORDER — PREDNISONE 20 MG PO TABS
ORAL_TABLET | ORAL | 0 refills | Status: DC
Start: 2020-01-03 — End: 2020-01-12

## 2020-01-03 NOTE — Telephone Encounter (Signed)
Summary: Patient would like the nurse to call regarding his medication  Reason for CRM: Patient would like the nurse to call him regarding his medication regarding his prednisone. Patient stated that it is very important. Would not give any other details. CB# [phone]    Returned pt call and pt is requesting a refill on Predniosne. Pt states he still has little wheezing in his chest. Pt states his [birth year] is [birth year] and is going out of town. Pt states he doesn't want to go to the Emergency Room. Pt would like rx sent to [facility] on [address]

## 2020-01-03 NOTE — Telephone Encounter (Signed)
Assessment & Plan Note by Martyn Ehrich, NP at 12/12/2019 7:12 PM Author: Martyn Ehrich, NP Author Type: Nurse Practitioner Filed: 12/12/2019 7:20 PM  Note Status: [place] List: Cosign Not Required Encounter Date: 12/12/2019  Problem: Eosinophilic asthma  Editor: Martyn Ehrich, NP (Nurse Practitioner)      Prior Versions: 1. Martyn Ehrich, NP (Nurse Practitioner) at 12/12/2019 7:20 PM - Edited   2. Martyn Ehrich, NP (Nurse Practitioner) at 12/12/2019 7:19 PM - Written      - Patient admitted to hospital with acute asthma exacerbation, he ran out fo Advair 1 month prior. He took his first Nucala injection at home on the day he presented to ED. He did NOT have a reaction to biologic, prior to receiving his first injection of Nucala he had already called EMS for ED evaluation. Patient was discharged on [place]. He has a fair amount of confusion regarding his asthma medication. He is currently on triple therapy and would do better with a simpler inhaler regimen. Discussed with our pharmacist and they will process a benefits investigation. We will also ensure patient's next [place] injection will be in office with pharmacist. He will be scheduled for apt on September 2nd at 2:20pm. He understands if medication is delivered to his house not to take injection and bring to pharmacy appointment.    PLAN: - Benefits investigation for triple inhaler therapy - Set up apt with pharmacy for second [place] injection      Attempted to call pt but unable to reach. Left message for him to return call.

## 2020-01-04 ENCOUNTER — Telehealth: Payer: Self-pay | Admitting: Primary Care

## 2020-01-04 MED ORDER — INCRUSE ELLIPTA 62.5 MCG/INH IN AEPB: 1.0000 | INHALATION_SPRAY | Freq: Every day | RESPIRATORY_TRACT | 0 refills | Status: DC

## 2020-01-04 NOTE — Telephone Encounter (Signed)
Contacted pt and lvm making pt aware rx has been sent to pharmacy

## 2020-01-04 NOTE — Telephone Encounter (Signed)
Left a voicemail on patient's phone regarding prior message. Patient is aware to keep appt with pharmacy on 9/16.Continue to  Use Dulera and incruse until discussed triple therapy . Nothing else further needed.

## 2020-01-04 NOTE — Telephone Encounter (Signed)
Spoke with patient regarding prior message. Patient is aware of refill for incruse sent into her pharmacy . Patient's voice was understanding.

## 2020-01-04 NOTE — Telephone Encounter (Signed)
Patient no showed his 12/29/19 appointment with the pharmacy team. Spoke to patient and rescheduled appointment for 9/16 @ 1:20p  Patient has an unmet pharmacy deductible of $69.54. Once met copays would be $47.00.  Trelegy copay- $116.54 Breztri copay- [phone]

## 2020-01-04 NOTE — Telephone Encounter (Signed)
Spoke with patient regarding prior message . Patient stated that he ran out of incruse and would like a script sent to his pharmacy.Beth can you please advise.

## 2020-01-04 NOTE — Telephone Encounter (Signed)
Please relay above information. Make sure patient keeps apt with Pharmacy on 9/16. We can discuss triple therapy then. For now continue Dulera and Incruse.

## 2020-01-04 NOTE — Telephone Encounter (Signed)
Can you do benefits investigation for TRELEGY or BREZTRI? Also this patient needs to be set up with pharmacy for more education regarding his Nucala injection and second his second injection   Cc: Artist

## 2020-01-04 NOTE — Telephone Encounter (Signed)
I sent message to pharmacy regarding benefits investigation for triple therapy. He would do better on simpler regimen, I would prefer him to be on Trelegy or Breztri if affordable. Awaiting response from them. We can send in 1 refill incruse.

## 2020-01-09 ENCOUNTER — Telehealth: Payer: Self-pay | Admitting: Pulmonary Disease

## 2020-01-09 NOTE — Telephone Encounter (Signed)
Patient no showed last appointment and patient previously confirmed appointment for 01/12/20.  Christian [name] instructed patient to keep appointment with pharmacy.  He can reschedule if need be but going against provider recommendations if he continues to reschedule or no show pharmacy appointments.  Pharmacy will only have appointment available through 9/30.

## 2020-01-09 NOTE — Telephone Encounter (Signed)
LMTCB

## 2020-01-09 NOTE — Telephone Encounter (Signed)
Thank you for informing me.  Patient is on schedule with pharmacy for 9/16.  Nothing further needed.   Mariella Saa, PharmD, [place], CPP Clinical Specialty Pharmacist (Rheumatology and Pulmonology)  01/09/2020 1:06 PM

## 2020-01-10 NOTE — Telephone Encounter (Signed)
Lmtcb for pt to reschedule pharmacy appt.

## 2020-01-10 NOTE — Progress Notes (Signed)
HPI Patient presents today to [facility] to see pharmacy team for injection and inhaler education.  Past medical history includes asthma/COPD, OSA, nasal polyps, type 2 diabetes, HTN.  He was approved for Nucala and enrolled in patient assistance.  Patient received injections in the mail and administered first dose at home without training.  First and last injection given on 8/5.   OBJECTIVE Allergies  Allergen Reactions   Phenergan [Promethazine Hcl] Itching and Other (See Comments)    burning    Outpatient Encounter Medications as of 01/12/2020  Medication Sig   ACCU-CHEK GUIDE test strip USE 1  TEST STRIP ONCE DAILY TO CHECK BLOOD SUGAR   albuterol (VENTOLIN HFA) 108 (90 Base) MCG/ACT inhaler INHALE 1 TO 2 PUFFS BY MOUTH EVERY 6 HOURS AS NEEDED FOR WHEEZING FOR SHORTNESS OF BREATH (Patient taking differently: Inhale 1-2 puffs into the lungs every 6 (six) hours as needed for wheezing or shortness of breath. )   Blood Glucose Monitoring Suppl (ONE TOUCH ULTRA 2) w/Device KIT Use as directed 3 times daily E11.9   cetirizine (ZYRTEC) 10 MG tablet Take 1 tablet (10 mg total) by mouth daily.   Fluticasone-Salmeterol (ADVAIR DISKUS) 500-50 MCG/DOSE AEPB Inhale 1 puff into the lungs 2 (two) times daily. (Patient not taking: Reported on 12/01/2019)   gabapentin (NEURONTIN) 300 MG capsule Take 1 capsule (300 mg total) by mouth 3 (three) times daily.   hydrochlorothiazide (HYDRODIURIL) 12.5 MG tablet Take 1 tablet (12.5 mg total) by mouth daily.   ipratropium-albuterol (DUONEB) 0.5-2.5 (3) MG/3ML SOLN Take 3 mLs by nebulization every 4 (four) hours as needed. (Patient taking differently: Take 3 mLs by nebulization every 4 (four) hours as needed (for breathing). )   lisinopril-hydrochlorothiazide (ZESTORETIC) 10-12.5 MG tablet Take 1 tablet by mouth daily.   Mepolizumab (NUCALA) 100 MG SOLR Inject 100 mg into the skin every 28 (twenty-eight) days.   metFORMIN (GLUCOPHAGE) 500 MG  tablet Take 1 tablet (500 mg total) by mouth every morning. (Patient taking differently: Take 500 mg by mouth daily with breakfast. )   montelukast (SINGULAIR) 10 MG tablet Take 1 tablet (10 mg total) by mouth at bedtime.   pravastatin (PRAVACHOL) 20 MG tablet Take 1 tablet (20 mg total) by mouth at bedtime.   predniSONE (DELTASONE) 20 MG tablet 1 tab PO daily x 4 days then 1/2 tab daily x 4 days   TRUEPLUS LANCETS 28G MISC Check glucose 2 times a day (Patient not taking: Reported on 12/12/2019)   umeclidinium bromide (INCRUSE ELLIPTA) 62.5 MCG/INH AEPB Inhale 1 puff into the lungs daily.   Vitamin D, Ergocalciferol, (DRISDOL) 1.25 MG (50000 UNIT) CAPS capsule Take 1 capsule (50,000 Units total) by mouth every 7 (seven) days.   No facility-administered encounter medications on file as of 01/12/2020.     Immunization History  Administered Date(s) Administered   Influenza Inj Mdck Quad Pf 05/29/2018   Influenza Split 02/23/2016   Influenza,inj,Quad PF,6+ Mos 12/22/2016, 02/10/2019   Influenza-Unspecified 02/07/2019   PFIZER SARS-COV-2 Vaccination 07/14/2019, 08/10/2019   Pneumococcal Conjugate-13 03/25/2016   Pneumococcal Polysaccharide-23 07/16/2016   Tdap 04/02/2016     PFTs PFT Results Latest Ref Rng & Units 05/25/2019 04/05/2019 05/07/2016  FVC-Pre L - 3.22 3.11  FVC-Predicted Pre % 86 86 82  FVC-Post L 3.33 3.33 2.46  FVC-Predicted Post % 89 89 64  Pre FEV1/FVC % % 68 68 58  Post FEV1/FCV % % 74 74 70  FEV1-Pre L 2.20 2.20 1.79  FEV1-Predicted Pre %  75 75 60  FEV1-Post L 2.46 2.46 1.73  DLCO uncorrected ml/min/mmHg 23.44 23.44 22.17  DLCO UNC% % 89 89 74  DLCO corrected ml/min/mmHg - - 20.65  DLCO COR %Predicted % - - 69  DLVA Predicted % 106 106 108  TLC L 5.63 5.63 5.42  TLC % Predicted % 85 85 82  RV % Predicted % 95 95 103     Assessment   1. Nucala Counseling and Injection Training  Patient dose will be Nucala 100 mg every 28 days.  Patient was  approved for patient assistance and received his first shipment.  He injected first dose at home without training.  Counseled patient on purpose, proper use, and adverse effects of Nucala including injection site reactions, headache, fatigue, and backache. Discussed various management strategies for injection site reactions including the use of anti-histamines, cortisone cream, and cold compresses. Discussed Nucala should be stored in the fridge and to remove medication from fridge at least 30 mins to 1 hour prior to injection to avoid stinging/burning.   Demonstrated proper injection technique with Nucala demo pen.  Patient able to demonstrate proper injection technique using the teach back method. Patient self injected in the lower abdomen with:  Sample Medication: Nucala 11m/ml NDC: [address]-34Lot: [id number] Expiration: 05/28/2021  Patient tolerated well.  Observed for 30 mins in office for adverse reaction and none noted. Instructed patient to call with any questions/issues.    2. Inhaler Optimization  Patient had difficulty recalling which inhaler he was using.  His inhalers were switch at after his recent hospitalization.  He has had issues with adherence due to cost.  Discussed switching to triple therapy to improve adherence.  Recommend Breztri as patient would likely qualify for patient assistance.  Patient signed application and will fax for approval.  Patient was counseled on the purpose, proper use, and adverse effects of Breztri inhaler.  Instructed patient to rinse mouth with water after using in order to prevent fungal infection.  Patient verbalized understanding.  Reviewed appropriate use of maintenance vs rescue inhalers.  Stressed importance of using maintenance inhaler daily and rescue inhaler only as needed. Also stressed the importance to continue inhalers while on Nucala as it is not a replacement but add on therapy. Patient verbalized understanding.  Demonstrated proper  inhaler technique using Breztridemo inhaler.  Patient able to demonstrate proper inhaler technique using teach back method. Patient was given sample in office today.   3. Medication Reconciliation  A drug regimen assessment was performed, including review of allergies, interactions, disease-state management, dosing and immunization history. Medications were reviewed with the patient, including name, instructions, indication, goals of therapy, potential side effects, importance of adherence, and safe use.  4. Immunizations  Patient up to date with pneumonia and covid-19 vaccines.  Recommend annual influenza and shingrix.  Patient will follow-up with local pharmacy.   All questions encouraged and answered.  Instructed patient to reach out with any further questions or concerns.  Thank you for allowing pharmacy to participate in this patient's care.  This appointment required 80 minutes of patient care (this includes precharting, chart review, review of results, face-to-face care, etc.).   AMariella Saa PharmD, BVan Voorhis CPP Clinical Specialty Pharmacist (Rheumatology and Pulmonology)  01/12/2020 4:28 PM

## 2020-01-12 ENCOUNTER — Other Ambulatory Visit: Payer: Self-pay

## 2020-01-12 ENCOUNTER — Encounter: Payer: Self-pay | Admitting: Emergency Medicine

## 2020-01-12 ENCOUNTER — Ambulatory Visit (INDEPENDENT_AMBULATORY_CARE_PROVIDER_SITE_OTHER): Payer: Medicare Other | Admitting: Pharmacist

## 2020-01-12 DIAGNOSIS — J8283 Eosinophilic asthma: Secondary | ICD-10-CM | POA: Diagnosis not present

## 2020-01-12 MED ORDER — BREZTRI AEROSPHERE 160-9-4.8 MCG/ACT IN AERO: 2.0000 | INHALATION_SPRAY | Freq: Two times a day (BID) | RESPIRATORY_TRACT | 0 refills | Status: DC

## 2020-01-12 NOTE — Telephone Encounter (Signed)
Letter printed and sent to patient.  Closing encounter as per protocol.  Nothing further needed.

## 2020-01-12 NOTE — Patient Instructions (Addendum)
Thank you for meeting with the pharmacy team today!  Below find a summary of what we discussed at your visit: . START Breztri 2 puffs twice daily . We will apply for [facility] patient assistance and update you when we hear a response. o If approved will mail to your house free of charge. . Continue Nucala 100 mg every 28 days.  (Next dose due on 10/14) . Schedule appointment with Dr. Ander Slade or APP in 1 month.  Call [phone] with any questions or concerns.  Mariella Saa, PharmD, [place], CPP Clinical Specialty Pharmacist (Rheumatology and Pulmonology)  01/12/2020 2:29 PM

## 2020-01-12 NOTE — Telephone Encounter (Signed)
Left message for pt to reschedule pharmacy appt.   Due to several unsuccessful attempts to reach pt a letter will be sent to pt to call back to reschedule appt.  Triage, please print and mail letter to pts home (letter already generated). Please sign off on message once complete.

## 2020-01-27 ENCOUNTER — Other Ambulatory Visit: Payer: Self-pay | Admitting: Pulmonary Disease

## 2020-01-27 ENCOUNTER — Telehealth: Payer: Self-pay | Admitting: Pulmonary Disease

## 2020-01-27 MED ORDER — PREDNISONE 20 MG PO TABS
40.0000 mg | ORAL_TABLET | Freq: Every day | ORAL | 0 refills | Status: AC
Start: 2020-01-27 — End: 2020-02-03

## 2020-01-27 NOTE — Telephone Encounter (Signed)
See previous encounter as this is a duplicate. Will close this encounter.

## 2020-01-27 NOTE — Telephone Encounter (Signed)
Pt returning a phone call. Pt can be reached at [phone].

## 2020-01-27 NOTE — Telephone Encounter (Signed)
Prescription for prednisone 40 daily for 7 days sent into pharmacy

## 2020-01-27 NOTE — Progress Notes (Signed)
Prednisone 40 daily for 7 days sent into pharmacy

## 2020-01-27 NOTE — Telephone Encounter (Signed)
Prescription for prednisone sent to pharmacy

## 2020-01-27 NOTE — Telephone Encounter (Signed)
Spoke with the pt  He is c/o increased SOB, wheezing and cough with minimal yellow sputum x 3 days  He has not had any f/c/s, body aches and has had his covid vaccines  He states still taking his breztri bid and albuterol inhaler a couple times per day  He is asking for rx for pred  He has f/u scheduled with Dr Ander Slade for 02/13/20 Please advise, thanks!

## 2020-01-27 NOTE — Telephone Encounter (Signed)
ATC patient.  LMTCB. 

## 2020-01-27 NOTE — Telephone Encounter (Signed)
Spoke with patient. He is aware that [facility] will send in prednisone for him.   Nothing further needed at time of call.

## 2020-01-30 DIAGNOSIS — G4733 Obstructive sleep apnea (adult) (pediatric): Secondary | ICD-10-CM | POA: Diagnosis not present

## 2020-02-13 ENCOUNTER — Ambulatory Visit (INDEPENDENT_AMBULATORY_CARE_PROVIDER_SITE_OTHER): Payer: Medicare Other | Admitting: Pulmonary Disease

## 2020-02-13 ENCOUNTER — Encounter: Payer: Self-pay | Admitting: Pulmonary Disease

## 2020-02-13 ENCOUNTER — Other Ambulatory Visit: Payer: Self-pay

## 2020-02-13 VITALS — BP 130/72 | HR 77 | Temp 98.2°F | Ht 70.0 in | Wt 194.6 lb

## 2020-02-13 DIAGNOSIS — G4733 Obstructive sleep apnea (adult) (pediatric): Secondary | ICD-10-CM

## 2020-02-13 DIAGNOSIS — Z9989 Dependence on other enabling machines and devices: Secondary | ICD-10-CM

## 2020-02-13 DIAGNOSIS — J8283 Eosinophilic asthma: Secondary | ICD-10-CM

## 2020-02-13 NOTE — Progress Notes (Signed)
Christian [name]    [id number]    1960/06/17  Primary Care Physician:Johnson, Dalbert Batman, MD  Referring Physician: Ladell Pier, MD [address] [place] [address],  [place] [address]  Chief complaint:   Patient being followed up for asthma, obstructive sleep apnea  HPI:  Diagnosed with OSA over 10 years ago Stopped using machine during episodes of recurrent upper respiratory infections  Has not been using CPAP Recent sleep study did reveal mild obstructive sleep apnea Asthma seems better controlled with biologic  He feels he is having the same symptoms that he had previously  Mild shortness of breath with exertion History of secondhand smoke exposure  Usually goes to bed between 10 and 11:30 PM Takes him between 15 and 30 minutes to fall asleep Final awakening between 6 and 7 AM  Never smoker  History of hypertension, asthma, high cholesterol  Is currently on a course of prednisone, uses Advair 250  Outpatient Encounter Medications as of 02/13/2020  Medication Sig  . ACCU-CHEK GUIDE test strip USE 1  TEST STRIP ONCE DAILY TO CHECK BLOOD SUGAR  . albuterol (VENTOLIN HFA) 108 (90 Base) MCG/ACT inhaler INHALE 1 TO 2 PUFFS BY MOUTH EVERY 6 HOURS AS NEEDED FOR WHEEZING FOR SHORTNESS OF BREATH (Patient taking differently: Inhale 1-2 puffs into the lungs every 6 (six) hours as needed for wheezing or shortness of breath. )  . Blood Glucose Monitoring Suppl (ONE TOUCH ULTRA 2) w/Device KIT Use as directed 3 times daily E11.9  . Budeson-Glycopyrrol-Formoterol (BREZTRI AEROSPHERE) 160-9-4.8 MCG/ACT AERO Inhale 2 puffs into the lungs 2 (two) times daily.  . cetirizine (ZYRTEC) 10 MG tablet Take 1 tablet (10 mg total) by mouth daily.  . furosemide (LASIX) 20 MG tablet Take 20 mg by mouth daily.  Marland Kitchen gabapentin (NEURONTIN) 300 MG capsule Take 1 capsule (300 mg total) by mouth 3 (three) times daily.  . hydrochlorothiazide (HYDRODIURIL) 12.5 MG tablet Take 1 tablet (12.5 mg total)  by mouth daily.  Marland Kitchen ipratropium-albuterol (DUONEB) 0.5-2.5 (3) MG/3ML SOLN Take 3 mLs by nebulization every 4 (four) hours as needed. (Patient taking differently: Take 3 mLs by nebulization every 4 (four) hours as needed (for breathing). )  . lisinopril-hydrochlorothiazide (ZESTORETIC) 10-12.5 MG tablet Take 1 tablet by mouth daily.  . Mepolizumab (NUCALA) 100 MG SOLR Inject 100 mg into the skin every 28 (twenty-eight) days.  . metFORMIN (GLUCOPHAGE) 500 MG tablet Take 1 tablet (500 mg total) by mouth every morning. (Patient taking differently: Take 500 mg by mouth daily with breakfast. )  . montelukast (SINGULAIR) 10 MG tablet Take 1 tablet (10 mg total) by mouth at bedtime.  . pravastatin (PRAVACHOL) 20 MG tablet Take 1 tablet (20 mg total) by mouth at bedtime.  . TRUEPLUS LANCETS 28G MISC Check glucose 2 times a day   No facility-administered encounter medications on file as of 02/13/2020.    Allergies as of 02/13/2020 - Review Complete 02/13/2020  Allergen Reaction Noted  . Phenergan [promethazine hcl] Itching and Other (See Comments) 09/15/2015    Past Medical History:  Diagnosis Date  . Allergy   . Arthritis    "left knee" (01/22/2016)  . Asthma   . Chronic lower back pain   . COPD (chronic obstructive pulmonary disease) ([place])   . Dyspnea    with extertion  . High cholesterol   . Hypertension   . Neuropathy   . OSA on CPAP   . Secondhand smoke exposure   .  Sleep apnea    wears CPAP, not wearing now  for months  . Type II diabetes mellitus ([place]) dx'd 01/17/2016    Past Surgical History:  Procedure Laterality Date  . ANTERIOR CERVICAL DECOMP/DISCECTOMY FUSION  ~ 1995   "titanium"  . APPENDECTOMY  ~ 1977  . BACK SURGERY     x 2  L 4disectomy  . COLONOSCOPY W/ POLYPECTOMY    . INGUINAL HERNIA REPAIR Right ~ 2000  . LUMBAR DISC SURGERY  ~ 2000; ~2006  . NASAL SINUS SURGERY  ~ 2007  . TOTAL KNEE ARTHROPLASTY Left 08/06/2017   Procedure: LEFT TOTAL KNEE ARTHROPLASTY;   Surgeon: Leandrew Koyanagi, MD;  Location: [place];  Service: Orthopedics;  Laterality: Left;    Family History  Problem Relation Age of Onset  . Hypertension Mother   . Hypertension Father   . Heart Problems Brother        Heart transplant, pt does not know cause   . Rectal cancer Brother   . Colon cancer Neg Hx   . Esophageal cancer Neg Hx   . Stomach cancer Neg Hx     Social History   Socioeconomic History  . Marital status: Married    Spouse name: Not on file  . Number of children: Not on file  . Years of education: Not on file  . Highest education level: Not on file  Occupational History  . Not on file  Tobacco Use  . Smoking status: Passive Smoke Exposure - Never Smoker  . Smokeless tobacco: Never Used  . Tobacco comment: passive cig smoking at work  1994- 2012  Vaping Use  . Vaping Use: Never used  Substance and Sexual Activity  . Alcohol use: No  . Drug use: No  . Sexual activity: Yes  Other Topics Concern  . Not on file  Social History Narrative  . Not on file   Social Determinants of Health   Financial Resource Strain:   . Difficulty of Paying Living Expenses: Not on file  Food Insecurity:   . Worried About Charity fundraiser in the Last Year: Not on file  . Ran Out of Food in the Last Year: Not on file  Transportation Needs:   . Lack of Transportation (Medical): Not on file  . Lack of Transportation (Non-Medical): Not on file  Physical Activity:   . Days of Exercise per Week: Not on file  . Minutes of Exercise per Session: Not on file  Stress:   . Feeling of Stress : Not on file  Social Connections:   . Frequency of Communication with Friends and Family: Not on file  . Frequency of Social Gatherings with Friends and Family: Not on file  . Attends Religious Services: Not on file  . Active Member of Clubs or Organizations: Not on file  . Attends Archivist Meetings: Not on file  . Marital Status: Not on file  Intimate Partner Violence:   .  Fear of Current or Ex-Partner: Not on file  . Emotionally Abused: Not on file  . Physically Abused: Not on file  . Sexually Abused: Not on file    Review of Systems  Constitutional: Negative.   HENT: Negative.   Eyes: Negative.   Respiratory: Positive for shortness of breath.   Cardiovascular: Negative.   Gastrointestinal: Negative.   Endocrine: Negative.   Genitourinary: Negative.     Vitals:   02/13/20 0941  BP: 130/72  Pulse: 77  Temp: 98.2 F (  36.8 C)  SpO2: 99%     Physical Exam Constitutional:      Appearance: He is well-developed.  HENT:     Head: Normocephalic and atraumatic.  Eyes:     General:        Right eye: No discharge.        Left eye: No discharge.  Neck:     Thyroid: No thyromegaly.     Trachea: No tracheal deviation.  Cardiovascular:     Rate and Rhythm: Normal rate and regular rhythm.  Pulmonary:     Effort: Pulmonary effort is normal. No respiratory distress.     Breath sounds: Normal breath sounds. No wheezing or rales.  Musculoskeletal:     Cervical back: No rigidity or tenderness.  Psychiatric:        Mood and Affect: Mood normal.    Results of the Epworth flowsheet 11/25/2018  Sitting and reading 0  Watching TV 1  Sitting, inactive in a public place (e.g. a theatre or a meeting) 0  As a passenger in a car for an hour without a break 0  Lying down to rest in the afternoon when circumstances permit 3  Sitting and talking to someone 3  Sitting quietly after a lunch without alcohol 0  In a car, while stopped for a few minutes in traffic 0  Total score 7    Data Reviewed: Previous sleep study not available to be reviewed Most recent study shows mild obstructive sleep apnea  Assessment:  Mild obstructive sleep apnea  -Encouraged to get back to using CPAP on a regular basis  Asthma with exacerbation Eosinophilic asthma -Started on biologic which is tolerated well-on Nucala -Avoid known triggers  Daytime sleepiness -Symptoms  better controlled at present  Shortness of breath -Stable status  Plan/Recommendations: Continue CPAP as tolerated  Continue Nucala -Every 28 days  Follow-up in 6 months   Sherrilyn Rist MD [facility] 02/13/2020, 11:30 AM  CC: Ladell Pier, MD

## 2020-02-13 NOTE — Patient Instructions (Signed)
Follow-up in 6 months  Your injectable is every 28 days  Home injectables  Call with any other significant concerns  Get back into using your CPAP on a regular basis

## 2020-02-17 ENCOUNTER — Telehealth: Payer: Medicare Other | Admitting: Internal Medicine

## 2020-02-27 ENCOUNTER — Telehealth: Payer: Self-pay | Admitting: Pulmonary Disease

## 2020-02-27 NOTE — Telephone Encounter (Signed)
Called and spoke with Patient.  Patient stated he has not received any notifications or Nucala at home.  Patient stated he was told by Luetta Nutting, at office injection, Nucala would come to his home. Patient is unsure of Specialty Pharmacy, and was not given any contact information from a specialty pharmacy. Per 07/06/19, Patient was approved through [facility] Patient assistance. Called [facility], spoke with Nadine. Danae Chen stated Patient was approved for assistance, and [facility] was to ship Patient  Nucala to home. Erica provided contact number, [phone]. Called [facility],spoke with Elmo Putt.  Patient is to contact pharmacy monthly to schedule Nucala delivery.  Elmo Putt stated she would place Nucala on refill for this month, but Patient will have to call to reorder 03/25/20. Contact number for Patient is [phone] or [phone]. Called and spoke with Patient. Updated Patient on contact numbers and Nucala delivery. Nothing further at this time.

## 2020-02-28 ENCOUNTER — Telehealth: Payer: Self-pay | Admitting: Pulmonary Disease

## 2020-02-28 NOTE — Telephone Encounter (Signed)
Called and spoke with Patient. Patient had problems with Nucala pen, during home injection.  Patient staed the medication never went into his skin and injection on his clothes. Patient scheduled 02/29/20 at 3pm to come to office for injection training.  Nucala sample will be provided for Patient.

## 2020-02-29 ENCOUNTER — Telehealth: Payer: Self-pay | Admitting: Pulmonary Disease

## 2020-02-29 ENCOUNTER — Ambulatory Visit (INDEPENDENT_AMBULATORY_CARE_PROVIDER_SITE_OTHER): Payer: Medicare Other

## 2020-02-29 ENCOUNTER — Other Ambulatory Visit: Payer: Self-pay

## 2020-02-29 DIAGNOSIS — J8283 Eosinophilic asthma: Secondary | ICD-10-CM | POA: Diagnosis not present

## 2020-02-29 MED ORDER — MEPOLIZUMAB 100 MG/ML ~~LOC~~ SOSY
100.0000 mg | PREFILLED_SYRINGE | Freq: Once | SUBCUTANEOUS | Status: AC
Start: 2020-02-29 — End: 2020-02-29
  Administered 2020-02-29: 100 mg via SUBCUTANEOUS

## 2020-02-29 NOTE — Progress Notes (Signed)
Have you been hospitalized within the last 10 days?  No Do you have a fever?  No Do you have a cough?  No Do you have a headache or sore throat? No Do you have your Epi Pen visible and is it within date?  Yes   Reviewed self injection with Patient. Patient injected Nucala pen in left lower abdomen. No problems.

## 2020-02-29 NOTE — Telephone Encounter (Signed)
Patient came to office today for Nucala injection.  Patient stated he is having some wheezing.  Patient requested Prednisone for a few days to help with any wheezing he may be having. Patient requested [facility].  Message routed to Dr Ander Slade to advise.

## 2020-03-01 ENCOUNTER — Encounter: Payer: Self-pay | Admitting: Physician Assistant

## 2020-03-01 ENCOUNTER — Ambulatory Visit: Payer: Medicare Other | Attending: Physician Assistant | Admitting: Physician Assistant

## 2020-03-01 ENCOUNTER — Other Ambulatory Visit: Payer: Self-pay | Admitting: Pulmonary Disease

## 2020-03-01 DIAGNOSIS — J329 Chronic sinusitis, unspecified: Secondary | ICD-10-CM | POA: Diagnosis not present

## 2020-03-01 DIAGNOSIS — E119 Type 2 diabetes mellitus without complications: Secondary | ICD-10-CM | POA: Diagnosis not present

## 2020-03-01 DIAGNOSIS — R059 Cough, unspecified: Secondary | ICD-10-CM | POA: Diagnosis not present

## 2020-03-01 MED ORDER — PREDNISONE 20 MG PO TABS: 20.0000 mg | ORAL_TABLET | Freq: Two times a day (BID) | ORAL | 0 refills | Status: AC

## 2020-03-01 MED ORDER — BENZONATATE 100 MG PO CAPS: 200.0000 mg | ORAL_CAPSULE | Freq: Three times a day (TID) | ORAL | 0 refills | Status: DC | PRN

## 2020-03-01 MED ORDER — AMOXICILLIN 500 MG PO CAPS: 500.0000 mg | ORAL_CAPSULE | Freq: Three times a day (TID) | ORAL | 0 refills | Status: DC

## 2020-03-01 NOTE — Telephone Encounter (Signed)
Called and spoke with Patient. Patient made aware of Prednisone prescription being sent to pharmacy.  Understanding stated.  Nothing further at this.

## 2020-03-01 NOTE — Telephone Encounter (Signed)
Prednisone sent in.

## 2020-03-01 NOTE — Progress Notes (Signed)
Virtual Visit via Telephone Note  I connected with [name] on 03/01/20 at  2:50 PM EDT by telephone and verified that I am speaking with the correct person using two identifiers.  Location: Patient: Christian [name] Provider: Freeman Caldron, PA-C   I discussed the limitations, risks, security and privacy concerns of performing an evaluation and management service by telephone and the availability of in person appointments. I also discussed with the patient that there may be a patient responsible charge related to this service. The patient expressed understanding and agreed to proceed.  PATIENT visit by telephone virtually in the context of Covid-19 pandemic. Patient location:  home My Location:  [place] office Persons on the call:  Me, the patient, and roomed Christian [name]   History of Present Illness:  Patient c/o severe sinus congestion and blowing greenish yellow drainage from his sinuses.  He has been feeling this way for about 7-10 days.  No fever.  No loss taste/smell.  Says he feels this wasy when he gets a sinus infection.  Some wheezing but nothing unusual.  Some cough from post-nasal drip and wheezing.  No GI S/sx.  Blood sugars running 98-120.  Prednisone Rx was just sent in.      Observations/Objective:  NAD.  A&Ox3.  Sounds nasally congested but no wheezing/coughing on the phone   Assessment and Plan: 1. Type 2 diabetes mellitus without complication, without long-term current use of insulin (HCC) Adequate control based on home numbers;  Last A1C=6.3 in August 2021  2. Sinusitis, unspecified chronicity, unspecified location Fluids, rest, respiratory care - amoxicillin (AMOXIL) 500 MG capsule; Take 1 capsule (500 mg total) by mouth 3 (three) times daily.  Dispense: 30 capsule; Refill: 0  3. Cough Continue inhalers etc as prescribed - benzonatate (TESSALON) 100 MG capsule; Take 2 capsules (200 mg total) by mouth 3 (three) times daily as needed.  Dispense: 40 capsule; Refill:  0    Follow Up Instructions: See PCP in about 2 months;  Sooner if needed   I discussed the assessment and treatment plan with the patient. The patient was provided an opportunity to ask questions and all were answered. The patient agreed with the plan and demonstrated an understanding of the instructions.   The patient was advised to call back or seek an in-person evaluation if the symptoms worsen or if the condition fails to improve as anticipated.  I provided 17 minutes of non-face-to-face time during this encounter.   Freeman Caldron, PA-C  Patient ID: Christian [name], male   DOB: [birth year], 59 y.o.   MRN: [id number]

## 2020-03-01 NOTE — Progress Notes (Unsigned)
Prednisone called in

## 2020-03-20 ENCOUNTER — Other Ambulatory Visit: Payer: Self-pay | Admitting: Internal Medicine

## 2020-03-20 NOTE — Telephone Encounter (Signed)
   Notes to clinic: Medication filled by historical provider Review for refill   Requested Prescriptions  Pending Prescriptions Disp Refills   furosemide (LASIX) 20 MG tablet [Pharmacy Med Name: Furosemide 20 MG Oral Tablet] 30 tablet 0    Sig: TAKE 1 TABLET BY MOUTH ONCE DAILY FOR  ONE  WEEK  THEN  AS  NEEDED  FOR  SWELLING      Cardiovascular:  Diuretics - Loop Failed - 03/20/2020  3:21 PM      Failed - Ca in normal range and within 360 days    Calcium  Date Value Ref Range Status  12/15/2019 8.3 (L) 8.7 - 10.2 mg/dL Final          Passed - K in normal range and within 360 days    Potassium  Date Value Ref Range Status  12/15/2019 3.8 3.5 - 5.2 mmol/L Final          Passed - Na in normal range and within 360 days    Sodium  Date Value Ref Range Status  12/15/2019 142 134 - 144 mmol/L Final          Passed - Cr in normal range and within 360 days    Creat  Date Value Ref Range Status  01/14/2016 1.09 0.70 - 1.33 mg/dL Final    Comment:      For patients > or = 60 years of age: The upper reference limit for Creatinine is approximately 13% higher for people identified as African-American.      Creatinine, Ser  Date Value Ref Range Status  12/15/2019 1.07 0.76 - 1.27 mg/dL Final   Creatinine, Urine  Date Value Ref Range Status  04/02/2016 164 20 - 370 mg/dL Final          Passed - Last BP in normal range    BP Readings from Last 1 Encounters:  02/13/20 130/72          Passed - Valid encounter within last 6 months    Recent Outpatient Visits           2 weeks ago Type 2 diabetes mellitus without complication, without long-term current use of insulin [facility])   [place], [place], [place]   3 months ago Hospital discharge follow-up   [place], Deborah B, MD   4 months ago Sinusitis, unspecified chronicity, unspecified location   [place], Charlane Ferretti, MD   5 months ago Acute left ankle pain   [place] Karle Plumber B, MD   6 months ago Type 2 diabetes mellitus without complication, without long-term current use of insulin [facility])   [place], [name], [place]       Future Appointments             In 5 months Erlinda Hong, Marylynn Pearson, MD [facility]

## 2020-03-29 ENCOUNTER — Telehealth: Payer: Self-pay | Admitting: Internal Medicine

## 2020-03-29 MED ORDER — AMOXICILLIN-POT CLAVULANATE 875-125 MG PO TABS: 1.0000 | ORAL_TABLET | Freq: Two times a day (BID) | ORAL | 0 refills | Status: DC

## 2020-03-29 NOTE — Telephone Encounter (Signed)
Patient would like the nurse to call him regarding his medication that the doctor prescribed.  Patient stated that the medication is not working and would like a new medication for his symptoms.  Please call patient to discuss at [phone]

## 2020-03-29 NOTE — Telephone Encounter (Signed)
Pt had a tele with Levada Dy on 03/01/20. Levada Dy prescribed pt Amoxicillin for Sinusitis. Pt states the mediation is not working and would like something else.

## 2020-03-30 ENCOUNTER — Other Ambulatory Visit: Payer: Self-pay

## 2020-03-30 DIAGNOSIS — R059 Cough, unspecified: Secondary | ICD-10-CM

## 2020-03-30 MED ORDER — BENZONATATE 100 MG PO CAPS: 200.0000 mg | ORAL_CAPSULE | Freq: Three times a day (TID) | ORAL | 0 refills | Status: DC | PRN

## 2020-03-30 NOTE — Telephone Encounter (Signed)
Contacted pt and made  aware of medication change

## 2020-03-30 NOTE — Telephone Encounter (Signed)
Pt is requesting to have the tessalon pearls refilled and sent to [facility]

## 2020-03-30 NOTE — Telephone Encounter (Signed)
Asked Dr. Wynetta Emery regarding tessalon pearls per Dr. Wynetta Emery it is okay to send a updated rx to pharmacy

## 2020-04-19 ENCOUNTER — Telehealth: Payer: Medicare Other

## 2020-04-19 ENCOUNTER — Other Ambulatory Visit: Payer: Self-pay

## 2020-04-23 ENCOUNTER — Other Ambulatory Visit: Payer: Self-pay | Admitting: Internal Medicine

## 2020-04-23 DIAGNOSIS — E785 Hyperlipidemia, unspecified: Secondary | ICD-10-CM

## 2020-04-24 ENCOUNTER — Telehealth: Payer: Medicare Other | Admitting: Physician Assistant

## 2020-04-24 DIAGNOSIS — J321 Chronic frontal sinusitis: Secondary | ICD-10-CM

## 2020-04-24 MED ORDER — DOXYCYCLINE HYCLATE 100 MG PO TABS: 100.0000 mg | ORAL_TABLET | Freq: Two times a day (BID) | ORAL | 0 refills | Status: DC

## 2020-04-24 MED ORDER — FLUTICASONE PROPIONATE 50 MCG/ACT NA SUSP: 2.0000 | Freq: Every day | NASAL | 6 refills | Status: AC

## 2020-04-24 NOTE — Progress Notes (Signed)
Patient verified DOB Patient complains of yellow congestion being present. Patient denies any N/V or diarrhea. Patient complains of no appetite and small amounts fill him up and cause a nauseated feeling. Patient denies any one in the home being sick. Patient has intermittent HA.

## 2020-04-24 NOTE — Progress Notes (Signed)
Established Patient Office Visit  Subjective:  Patient ID: Christian [name], male    DOB: 1961/01/29  Age: 59 y.o. MRN: [id number]  CC:  Chief Complaint  Patient presents with  . Sinusitis   Virtual Visit via Telephone Note  I connected with [name] on 04/24/20 at  2:20 PM EST by telephone and verified that I am speaking with the correct person using two identifiers.  Location: Patient: Home Provider: [facility] Medicine Unit    I discussed the limitations, risks, security and privacy concerns of performing an evaluation and management service by telephone and the availability of in person appointments. I also discussed with the patient that there may be a patient responsible charge related to this service. The patient expressed understanding and agreed to proceed.   History of Present Illness:  Christian [name] reports that he has been having yellow congestion, headaches, feeling of nausea since the middle of October 21.  Reports that he has chronic sinusitis.  Reports that he has been treated with prednisone, amoxicillin, and Augmentin he has not felt relief with reports that he did not see ear nose and throat in January 2021, at that time he was told to use at bedtime on a daily basis along with his Singulair and antihistamine reports that he has been taking Singulair and antihistamine, however he has not been using Flonase, states he takes nasal spray.  Has not followed up with ear nose and throat    Observations/Objective: Medical history and current medications reviewed, no physical exam completed    Past Medical History:  Diagnosis Date  . Allergy   . Arthritis    "left knee" (01/22/2016)  . Asthma   . Chronic lower back pain   . COPD (chronic obstructive pulmonary disease) ([place])   . Dyspnea    with extertion  . High cholesterol   . Hypertension   . Neuropathy   . OSA on CPAP   . Secondhand smoke exposure   . Sleep apnea    wears CPAP, not  wearing now  for months  . Type II diabetes mellitus ([place]) dx'd 01/17/2016    Past Surgical History:  Procedure Laterality Date  . ANTERIOR CERVICAL DECOMP/DISCECTOMY FUSION  ~ 1995   "titanium"  . APPENDECTOMY  ~ 1977  . BACK SURGERY     x 2  L 4disectomy  . COLONOSCOPY W/ POLYPECTOMY    . INGUINAL HERNIA REPAIR Right ~ 2000  . LUMBAR DISC SURGERY  ~ 2000; ~2006  . NASAL SINUS SURGERY  ~ 2007  . TOTAL KNEE ARTHROPLASTY Left 08/06/2017   Procedure: LEFT TOTAL KNEE ARTHROPLASTY;  Surgeon: Leandrew Koyanagi, MD;  Location: [place];  Service: Orthopedics;  Laterality: Left;    Family History  Problem Relation Age of Onset  . Hypertension Mother   . Hypertension Father   . Heart Problems Brother        Heart transplant, pt does not know cause   . Rectal cancer Brother   . Colon cancer Neg Hx   . Esophageal cancer Neg Hx   . Stomach cancer Neg Hx     Social History   Socioeconomic History  . Marital status: Married    Spouse name: Not on file  . Number of children: Not on file  . Years of education: Not on file  . Highest education level: Not on file  Occupational History  . Not on file  Tobacco Use  . Smoking status:  Passive Smoke Exposure - Never Smoker  . Smokeless tobacco: Never Used  . Tobacco comment: passive cig smoking at work  1994- 2012  Vaping Use  . Vaping Use: Never used  Substance and Sexual Activity  . Alcohol use: No  . Drug use: No  . Sexual activity: Yes  Other Topics Concern  . Not on file  Social History Narrative  . Not on file   Social Determinants of Health   Financial Resource Strain: Not on file  Food Insecurity: Not on file  Transportation Needs: Not on file  Physical Activity: Not on file  Stress: Not on file  Social Connections: Not on file  Intimate Partner Violence: Not on file    Outpatient Medications Prior to Visit  Medication Sig Dispense Refill  . ACCU-CHEK GUIDE test strip USE 1  TEST STRIP ONCE DAILY TO CHECK BLOOD SUGAR 100  each 1  . albuterol (VENTOLIN HFA) 108 (90 Base) MCG/ACT inhaler INHALE 1 TO 2 PUFFS BY MOUTH EVERY 6 HOURS AS NEEDED FOR WHEEZING FOR SHORTNESS OF BREATH (Patient taking differently: Inhale 1-2 puffs into the lungs every 6 (six) hours as needed for wheezing or shortness of breath. ) 9 g 11  . benzonatate (TESSALON) 100 MG capsule Take 2 capsules (200 mg total) by mouth 3 (three) times daily as needed. 40 capsule 0  . Blood Glucose Monitoring Suppl (ONE TOUCH ULTRA 2) w/Device KIT Use as directed 3 times daily E11.9 1 each 0  . Budeson-Glycopyrrol-Formoterol (BREZTRI AEROSPHERE) 160-9-4.8 MCG/ACT AERO Inhale 2 puffs into the lungs 2 (two) times daily. 10.7 g 0  . cetirizine (ZYRTEC) 10 MG tablet Take 1 tablet (10 mg total) by mouth daily. 30 tablet 11  . furosemide (LASIX) 20 MG tablet TAKE 1 TABLET BY MOUTH ONCE DAILY FOR  ONE  WEEK  THEN  AS  NEEDED  FOR  SWELLING 30 tablet 0  . gabapentin (NEURONTIN) 300 MG capsule Take 1 capsule (300 mg total) by mouth 3 (three) times daily. 90 capsule 3  . hydrochlorothiazide (HYDRODIURIL) 12.5 MG tablet Take 1 tablet (12.5 mg total) by mouth daily. 90 tablet 3  . ipratropium-albuterol (DUONEB) 0.5-2.5 (3) MG/3ML SOLN Take 3 mLs by nebulization every 4 (four) hours as needed. (Patient taking differently: Take 3 mLs by nebulization every 4 (four) hours as needed (for breathing). ) 360 mL 2  . lisinopril-hydrochlorothiazide (ZESTORETIC) 10-12.5 MG tablet Take 1 tablet by mouth daily. 90 tablet 2  . Mepolizumab (NUCALA) 100 MG SOLR Inject 100 mg into the skin every 28 (twenty-eight) days. 1 each 12  . metFORMIN (GLUCOPHAGE) 500 MG tablet Take 1 tablet (500 mg total) by mouth every morning. (Patient taking differently: Take 500 mg by mouth daily with breakfast. ) 90 tablet 1  . montelukast (SINGULAIR) 10 MG tablet Take 1 tablet (10 mg total) by mouth at bedtime. 90 tablet 1  . pravastatin (PRAVACHOL) 20 MG tablet TAKE 1 TABLET BY MOUTH AT BEDTIME 90 tablet 0  .  TRUEPLUS LANCETS 28G MISC Check glucose 2 times a day 100 each 3  . amoxicillin-clavulanate (AUGMENTIN) 875-125 MG tablet Take 1 tablet by mouth 2 (two) times daily. 14 tablet 0   No facility-administered medications prior to visit.    Allergies  Allergen Reactions  . Phenergan [Promethazine Hcl] Itching and Other (See Comments)    burning    ROS Review of Systems  Constitutional: Positive for fever.  HENT: Positive for congestion, sinus pressure and sinus pain. Negative for sore  throat and trouble swallowing.   Eyes: Negative.   Respiratory: Negative for cough, shortness of breath and wheezing.   Cardiovascular: Negative.   Gastrointestinal: Positive for nausea. Negative for abdominal pain and vomiting.  Endocrine: Negative.   Genitourinary: Negative.   Musculoskeletal: Negative for myalgias.  Skin: Negative.   Allergic/Immunologic: Negative.   Neurological: Negative.   Hematological: Negative.   Psychiatric/Behavioral: Negative.       Objective:    There were no vitals taken for this visit. Wt Readings from Last 3 Encounters:  02/13/20 194 lb 9.6 oz (88.3 kg)  12/15/19 196 lb 6.4 oz (89.1 kg)  12/12/19 191 lb 3.2 oz (86.7 kg)     Health Maintenance Due  Topic Date Due  . FOOT EXAM  04/02/2017  . INFLUENZA VACCINE  11/27/2019  . COVID-19 Vaccine (3 - Booster for Pfizer series) 02/09/2020    There are no preventive care reminders to display for this patient.  Lab Results  Component Value Date   TSH 3.550 08/19/2018   Lab Results  Component Value Date   WBC 6.7 12/15/2019   HGB 15.5 12/15/2019   HCT 46.2 12/15/2019   MCV 90 12/15/2019   PLT 243 12/15/2019   Lab Results  Component Value Date   NA 142 12/15/2019   K 3.8 12/15/2019   CO2 27 12/15/2019   GLUCOSE 76 12/15/2019   BUN 12 12/15/2019   CREATININE 1.07 12/15/2019   BILITOT 0.4 09/08/2019   ALKPHOS 35 (L) 09/08/2019   AST 16 09/08/2019   ALT 15 09/08/2019   PROT 5.8 (L) 09/08/2019    ALBUMIN 3.8 09/08/2019   CALCIUM 8.3 (L) 12/15/2019   ANIONGAP 11 12/03/2019   Lab Results  Component Value Date   CHOL 167 09/14/2018   Lab Results  Component Value Date   HDL 63 09/14/2018   Lab Results  Component Value Date   LDLCALC 95 09/14/2018   Lab Results  Component Value Date   TRIG 43 09/14/2018   Lab Results  Component Value Date   CHOLHDL 2.7 09/14/2018   Lab Results  Component Value Date   HGBA1C 6.3 (H) 12/02/2019      Assessment & Plan:   Problem List Items Addressed This Visit   None   Visit Diagnoses    Chronic frontal sinusitis    -  Primary   Relevant Medications   doxycycline (VIBRA-TABS) 100 MG tablet   fluticasone (FLONASE) 50 MCG/ACT nasal spray     Assessment and Plan: 1. Chronic frontal sinusitis Trial of doxycycline, encourage patient to resume Flonase on a daily basis.  Continue Singulair and antihistamine.  Continue rest and hydration, encouraged patient to schedule follow-up with ear nose and throat as possible.  The patient was given clear instructions to go to ER or return to medical center if symptoms don't improve, worsen or new problems develop. The patient verbalized understanding.    - doxycycline (VIBRA-TABS) 100 MG tablet; Take 1 tablet (100 mg total) by mouth 2 (two) times daily.  Dispense: 20 tablet; Refill: 0 - fluticasone (FLONASE) 50 MCG/ACT nasal spray; Place 2 sprays into both nostrils daily.  Dispense: 16 g; Refill: 6  Follow Up Instructions:    I discussed the assessment and treatment plan with the patient. The patient was provided an opportunity to ask questions and all were answered. The patient agreed with the plan and demonstrated an understanding of the instructions.   The patient was advised to call back or seek an in-person  evaluation if the symptoms worsen or if the condition fails to improve as anticipated.  I provided 21  minutes of non-face-to-face time during this encounter.   Jilian West S Mayers,  PA-C     Meds ordered this encounter  Medications  . doxycycline (VIBRA-TABS) 100 MG tablet    Sig: Take 1 tablet (100 mg total) by mouth 2 (two) times daily.    Dispense:  20 tablet    Refill:  0    Order Specific Question:   Supervising Provider    Answer:   Asencion Noble E [[id number]]  . fluticasone (FLONASE) 50 MCG/ACT nasal spray    Sig: Place 2 sprays into both nostrils daily.    Dispense:  16 g    Refill:  6    Order Specific Question:   Supervising Provider    Answer:   Elsie Stain [[id number]]    Follow-up: No follow-ups on file.    Loraine Grip Mayers, PA-C

## 2020-04-24 NOTE — Patient Instructions (Signed)
I sent a prescription to your pharmacy for doxycycline, and flonase.  Please make sure to follow up with ENT.   I hope that you feel better soon!  Christian Rad, PA-C Physician Assistant [facility] [email or web address]    Sinusitis, Adult Sinusitis is inflammation of your sinuses. Sinuses are hollow spaces in the bones around your face. Your sinuses are located:  Around your eyes.  In the middle of your forehead.  Behind your nose.  In your cheekbones. Mucus normally drains out of your sinuses. When your nasal tissues become inflamed or swollen, mucus can become trapped or blocked. This allows bacteria, viruses, and fungi to grow, which leads to infection. Most infections of the sinuses are caused by a virus. Sinusitis can develop quickly. It can last for up to 4 weeks (acute) or for more than 12 weeks (chronic). Sinusitis often develops after a cold. What are the causes? This condition is caused by anything that creates swelling in the sinuses or stops mucus from draining. This includes:  Allergies.  Asthma.  Infection from bacteria or viruses.  Deformities or blockages in your nose or sinuses.  Abnormal growths in the nose (nasal polyps).  Pollutants, such as chemicals or irritants in the air.  Infection from fungi (rare). What increases the risk? You are more likely to develop this condition if you:  Have a weak body defense system (immune system).  Do a lot of swimming or diving.  Overuse nasal sprays.  Smoke. What are the signs or symptoms? The main symptoms of this condition are pain and a feeling of pressure around the affected sinuses. Other symptoms include:  Stuffy nose or congestion.  Thick drainage from your nose.  Swelling and warmth over the affected sinuses.  Headache.  Upper toothache.  A cough that may get worse at night.  Extra mucus that collects in the throat or the back of the  nose (postnasal drip).  Decreased sense of smell and taste.  Fatigue.  A fever.  Sore throat.  Bad breath. How is this diagnosed? This condition is diagnosed based on:  Your symptoms.  Your medical history.  A physical exam.  Tests to find out if your condition is acute or chronic. This may include: ? Checking your nose for nasal polyps. ? Viewing your sinuses using a device that has a light (endoscope). ? Testing for allergies or bacteria. ? Imaging tests, such as an MRI or CT scan. In rare cases, a bone biopsy may be done to rule out more serious types of fungal sinus disease. How is this treated? Treatment for sinusitis depends on the cause and whether your condition is chronic or acute.  If caused by a virus, your symptoms should go away on their own within 10 days. You may be given medicines to relieve symptoms. They include: ? Medicines that shrink swollen nasal passages (topical intranasal decongestants). ? Medicines that treat allergies (antihistamines). ? A spray that eases inflammation of the nostrils (topical intranasal corticosteroids). ? Rinses that help get rid of thick mucus in your nose (nasal saline washes).  If caused by bacteria, your health care provider may recommend waiting to see if your symptoms improve. Most bacterial infections will get better without antibiotic medicine. You may be given antibiotics if you have: ? A severe infection. ? A weak immune system.  If caused by narrow nasal passages or nasal polyps, you may need to have surgery. Follow these instructions at home: Medicines  Take, use, or apply  over-the-counter and prescription medicines only as told by your health care provider. These may include nasal sprays.  If you were prescribed an antibiotic medicine, take it as told by your health care provider. Do not stop taking the antibiotic even if you start to feel better. Hydrate and humidify   Drink enough fluid to keep your urine  pale yellow. Staying hydrated will help to thin your mucus.  Use a cool mist humidifier to keep the humidity level in your home above 50%.  Inhale steam for 10-15 minutes, 3-4 times a day, or as told by your health care provider. You can do this in the bathroom while a hot shower is running.  Limit your exposure to cool or dry air. Rest  Rest as much as possible.  Sleep with your head raised (elevated).  Make sure you get enough sleep each night. General instructions   Apply a warm, moist washcloth to your face 3-4 times a day or as told by your health care provider. This will help with discomfort.  Wash your hands often with soap and water to reduce your exposure to germs. If soap and water are not available, use hand sanitizer.  Do not smoke. Avoid being around people who are smoking (secondhand smoke).  Keep all follow-up visits as told by your health care provider. This is important. Contact a health care provider if:  You have a fever.  Your symptoms get worse.  Your symptoms do not improve within 10 days. Get help right away if:  You have a severe headache.  You have persistent vomiting.  You have severe pain or swelling around your face or eyes.  You have vision problems.  You develop confusion.  Your neck is stiff.  You have trouble breathing. Summary  Sinusitis is soreness and inflammation of your sinuses. Sinuses are hollow spaces in the bones around your face.  This condition is caused by nasal tissues that become inflamed or swollen. The swelling traps or blocks the flow of mucus. This allows bacteria, viruses, and fungi to grow, which leads to infection.  If you were prescribed an antibiotic medicine, take it as told by your health care provider. Do not stop taking the antibiotic even if you start to feel better.  Keep all follow-up visits as told by your health care provider. This is important. This information is not intended to replace advice  given to you by your health care provider. Make sure you discuss any questions you have with your health care provider. Document Revised: 09/14/2017 Document Reviewed: 09/14/2017 Elsevier Patient Education  2020 [employer].

## 2020-05-02 DIAGNOSIS — Z20822 Contact with and (suspected) exposure to covid-19: Secondary | ICD-10-CM | POA: Diagnosis not present

## 2020-05-08 ENCOUNTER — Other Ambulatory Visit: Payer: Self-pay | Admitting: Pulmonary Disease

## 2020-05-08 ENCOUNTER — Telehealth: Payer: Self-pay | Admitting: Pulmonary Disease

## 2020-05-08 MED ORDER — PREDNISONE 20 MG PO TABS
20.0000 mg | ORAL_TABLET | Freq: Every day | ORAL | 0 refills | Status: DC
Start: 2020-05-08 — End: 2020-06-25

## 2020-05-08 NOTE — Telephone Encounter (Signed)
Called and spoke with Patient.  Patient aware Dr. Ander Slade has sent a prednisone prescription to requested pharmacy. Nothing further at this time.

## 2020-05-08 NOTE — Progress Notes (Signed)
Prednisone, 20 mg daily

## 2020-05-08 NOTE — Telephone Encounter (Signed)
Called and spoke with Patient.  Patient stated he is having cold like symptoms that is causing a asthma flare.  Patient stated he is having some runny nose, congestion, and increased sob.  Patient stated he was covid tested at [facility] 3 days and he was negative.  Patient stated he has been very careful to wear mask, distance himself from others, and only go out if needed. Patient has been fully  vaccinated.  Patient stated he has used his albuterol and nebs more often for his increased sob. Patient is requesting prednisone taper to be sent to [facility], to help with sob.  Message routed to Dr Ander Slade

## 2020-05-08 NOTE — Telephone Encounter (Signed)
Sent in

## 2020-06-25 ENCOUNTER — Telehealth: Payer: Self-pay | Admitting: Internal Medicine

## 2020-06-25 ENCOUNTER — Ambulatory Visit: Payer: Self-pay | Admitting: *Deleted

## 2020-06-25 MED ORDER — PREDNISONE 20 MG PO TABS: ORAL_TABLET | ORAL | 0 refills | Status: DC

## 2020-06-25 NOTE — Telephone Encounter (Signed)
Patient is calling to request Prednisone treatment for asthma exacerbation. Patient states it has been getting worse for 2 weeks and his treatments are only lasting about 2 hours. Patient advised no open appointments at the office- may have to go to UC- unable to contact office- will send high priority for review and possible scheduling. Patient request call back with instruction- medication or UC referral.  Reason for Disposition  [1] MILD difficulty breathing (e.g., minimal/no SOB at rest, SOB with walking, pulse <100) AND [2] NEW-onset or WORSE than normal  Answer Assessment - Initial Assessment Questions 1. RESPIRATORY STATUS: "Describe your breathing?" (e.g., wheezing, shortness of breath, unable to speak, severe coughing)      Wheezing- using treatment- helping some- not lasting(2 hours) 2. ONSET: "When did this breathing problem begin?"      2 weeks 3. PATTERN "Does the difficult breathing come and go, or has it been constant since it started?"      Comes and goes- having to use additonal treatments 4. SEVERITY: "How bad is your breathing?" (e.g., mild, moderate, severe)    - MILD: No SOB at rest, mild SOB with walking, speaks normally in sentences, can lay down, no retractions, pulse < 100.    - MODERATE: SOB at rest, SOB with minimal exertion and prefers to sit, cannot lie down flat, speaks in phrases, mild retractions, audible wheezing, pulse 100-120.    - SEVERE: Very SOB at rest, speaks in single words, struggling to breathe, sitting hunched forward, retractions, pulse > 120      mild 5. RECURRENT SYMPTOM: "Have you had difficulty breathing before?" If Yes, ask: "When was the last time?" and "What happened that time?"      asthma 6. CARDIAC HISTORY: "Do you have any history of heart disease?" (e.g., heart attack, angina, bypass surgery, angioplasty)      no 7. LUNG HISTORY: "Do you have any history of lung disease?"  (e.g., pulmonary embolus, asthma, emphysema)     asthma 8.  CAUSE: "What do you think is causing the breathing problem?"      On/off- not constant, sinus drainage 9. OTHER SYMPTOMS: "Do you have any other symptoms? (e.g., dizziness, runny nose, cough, chest pain, fever)     Sinus drainage 10. PREGNANCY: "Is there any chance you are pregnant?" "When was your last menstrual period?"       n/a 11. TRAVEL: "Have you traveled out of the country in the last month?" (e.g., travel history, exposures)      no  Protocols used: BREATHING DIFFICULTY-A-AH

## 2020-06-25 NOTE — Addendum Note (Signed)
Addended by: Karle Plumber B on: 06/25/2020 06:17 PM   Modules accepted: Orders

## 2020-06-25 NOTE — Telephone Encounter (Signed)
Will forward to provider  

## 2020-06-27 NOTE — Telephone Encounter (Signed)
Copied from [place] [id number]. Topic: General - Other >> Jun 25, 2020  4:17 PM Wynetta Emery, [place] C wrote: Reason for CRM: pt called in to follow up on triage message. Pt would like to have a call back from providers assistant for further assistance. >> Jun 27, 2020  2:05 PM Rothrock, Mallory wrote: Called patient and LVM advising him to call back in regards to his triage note. Advised to call [phone] for advice.  >> Jun 26, 2020  1:17 PM Rothrock, Sherrine Maples wrote: Called patient and LVM advising him to call [phone] in regards to his NT note.

## 2020-07-02 ENCOUNTER — Other Ambulatory Visit: Payer: Self-pay | Admitting: Internal Medicine

## 2020-07-02 DIAGNOSIS — J8283 Eosinophilic asthma: Secondary | ICD-10-CM

## 2020-07-02 NOTE — Telephone Encounter (Signed)
  Notes to clinic: review for refill Patient last filled 12/28/2019    Requested Prescriptions  Pending Prescriptions Disp Refills   albuterol (VENTOLIN HFA) 108 (90 Base) MCG/ACT inhaler [Pharmacy Med Name: Albuterol Sulfate HFA 108 (90 Base) MCG/ACT Inhalation Aerosol Solution] 18 g 0    Sig: INHALE 1 TO 2 PUFFS BY MOUTH EVERY 6 HOURS AS NEEDED FOR WHEEZING FOR SHORTNESS OF BREATH      Pulmonology:  Beta Agonists Failed - 07/02/2020 12:39 PM      Failed - One inhaler should last at least one month. If the patient is requesting refills earlier, contact the patient to check for uncontrolled symptoms.      Passed - Valid encounter within last 12 months    Recent Outpatient Visits           4 months ago Type 2 diabetes mellitus without complication, without long-term current use of insulin [facility])   [place], [place], [place]   6 months ago Hospital discharge follow-up   [place] Ladell Pier, MD   7 months ago Sinusitis, unspecified chronicity, unspecified location   [place], Charlane Ferretti, MD   8 months ago Acute left ankle pain   [place], MD   9 months ago Type 2 diabetes mellitus without complication, without long-term current use of insulin [facility])   [place], [name], [place]       Future Appointments             In 2 months Erlinda Hong, Marylynn Pearson, MD [facility]

## 2020-07-30 ENCOUNTER — Telehealth: Payer: Self-pay | Admitting: Pulmonary Disease

## 2020-07-30 NOTE — Telephone Encounter (Signed)
Returned call to see if patient dropped off forms or if he needs forms, left message to call back.

## 2020-07-31 ENCOUNTER — Other Ambulatory Visit (HOSPITAL_COMMUNITY): Payer: Self-pay

## 2020-07-31 NOTE — Telephone Encounter (Signed)
Submitted Patient Assistance Application to [employer] [facility] [place] for [place] along with provider portion. Will update patient when we receive a response.  Fax# [phone]  Phone# [phone]

## 2020-07-31 NOTE — Telephone Encounter (Signed)
Patient returned call and advised that he needs to complete Nucala PAP paperwork to re-enroll for 2022. Patient will stop by office to complete his portion, will leave at front desk.

## 2020-07-31 NOTE — Telephone Encounter (Signed)
Received signed provider portion, awaiting patient to come by office to sign his portion.

## 2020-07-31 NOTE — Telephone Encounter (Signed)
Pt filled out form and is sitting in pharmacy box. Please advise

## 2020-08-01 ENCOUNTER — Other Ambulatory Visit: Payer: Self-pay | Admitting: Internal Medicine

## 2020-08-01 DIAGNOSIS — J8283 Eosinophilic asthma: Secondary | ICD-10-CM

## 2020-08-01 DIAGNOSIS — E785 Hyperlipidemia, unspecified: Secondary | ICD-10-CM

## 2020-08-01 DIAGNOSIS — E119 Type 2 diabetes mellitus without complications: Secondary | ICD-10-CM

## 2020-08-01 NOTE — Telephone Encounter (Signed)
Attempted to call patient to schedule follow up appointment with PCP- patient is overdue labs and needs OV for diabetic medication follow up. Left message to call back for appointment- courtesy refills given #[phone].

## 2020-08-03 NOTE — Telephone Encounter (Signed)
Received notification from  [employer] [place] regarding an approval for [place] patient assistance from 08/02/20 to 04/27/21.   Phone number: [phone]  Called patient and advised. Advised to call with any questions.

## 2020-08-06 ENCOUNTER — Other Ambulatory Visit: Payer: Self-pay | Admitting: Internal Medicine

## 2020-08-07 ENCOUNTER — Telehealth: Payer: Self-pay | Admitting: Primary Care

## 2020-08-07 NOTE — Telephone Encounter (Signed)
Please f/u with pharmacy, looks like patient was a new start.  Thank you.

## 2020-08-07 NOTE — Telephone Encounter (Signed)
Patient is not Nucala new start. Spoke with [employer]. Pharmacist states patient requested PFS not auto-injector. He tried the auto-injector, heard a click, and didn't get the medicine.  Left VM with patient requesting clarification on what he'd prefer.  [facility] phone: [phone]  Knox Saliva, PharmD, MPH Clinical Pharmacist (Rheumatology and Pulmonology)

## 2020-08-08 MED ORDER — NUCALA 100 MG/ML ~~LOC~~ SOSY: 100.0000 mg | PREFILLED_SYRINGE | SUBCUTANEOUS | 3 refills | Status: DC

## 2020-08-08 NOTE — Telephone Encounter (Addendum)
Spoke with patient who states he used auto-injector pen, heard 1 click, and continued holding pen, didn't hear second click and window did not change. He states he did not feel injection. He prefers using PFS because he can feel medication and has greater control of administration.  Spoke with pharmacist, Audry Pili, at [facility] to change medication to PFS. Nothing further needed  Knox Saliva, PharmD, MPH Clinical Pharmacist (Rheumatology and Pulmonology)

## 2020-08-08 NOTE — Telephone Encounter (Signed)
ATC patient to discuss if he wants Nucala PFS vs auto-injector.  Left VM requesting return call. Will continue to f/u

## 2020-08-13 ENCOUNTER — Telehealth: Payer: Self-pay | Admitting: Pulmonary Disease

## 2020-08-13 NOTE — Telephone Encounter (Signed)
Routed to pharmacy team 

## 2020-08-14 ENCOUNTER — Other Ambulatory Visit: Payer: Self-pay | Admitting: Pulmonary Disease

## 2020-08-14 MED ORDER — PREDNISONE 20 MG PO TABS: 20.0000 mg | ORAL_TABLET | Freq: Every day | ORAL | 0 refills | Status: DC

## 2020-08-14 NOTE — Telephone Encounter (Signed)
Called and spoke with pt letting him know that [facility] sent Rx for prednisone to pharmacy for him and he verbalized understanding. Nothing further needed.

## 2020-08-14 NOTE — Telephone Encounter (Signed)
Spoke with patient regarding Nucala. States he was advised to wait to take next injection when it'd be due. He states he felt the needle penetrate his skin but did not feel medication. He's been advised that it's unclear how much medication he received if he received any, but states nothing leaked out of Nucala pen when he removed from skin. He will plan to take injection when it'd be due.  Routing to Dr. Ander Slade - he also states that he's having increased difficulty breathing. No cough or mucus. Stating this started last week. Has been using albuterol and nebs 3-4 times a day. Using [employer] daily as prescribed (don't see Breo on medication list though and he is sure he is not taking Breztri). He is requesting prednisone to help with symptoms.  Patient requesting appointment with provider. Transferred him to scheduling team.  Knox Saliva, PharmD, MPH Clinical Pharmacist (Rheumatology and Pulmonology)

## 2020-08-14 NOTE — Telephone Encounter (Signed)
Called in prednisone 20 daily for 10 days

## 2020-08-14 NOTE — Telephone Encounter (Signed)
Returned call to patient regarding Nucala.   Left VM requesting return call back.

## 2020-08-20 ENCOUNTER — Ambulatory Visit (INDEPENDENT_AMBULATORY_CARE_PROVIDER_SITE_OTHER): Payer: Medicare Other | Admitting: Pulmonary Disease

## 2020-08-20 ENCOUNTER — Other Ambulatory Visit: Payer: Self-pay

## 2020-08-20 ENCOUNTER — Encounter: Payer: Self-pay | Admitting: Pulmonary Disease

## 2020-08-20 VITALS — BP 132/86 | HR 69 | Temp 98.0°F | Ht 70.0 in | Wt 192.0 lb

## 2020-08-20 DIAGNOSIS — J8283 Eosinophilic asthma: Secondary | ICD-10-CM | POA: Diagnosis not present

## 2020-08-20 DIAGNOSIS — G4733 Obstructive sleep apnea (adult) (pediatric): Secondary | ICD-10-CM

## 2020-08-20 DIAGNOSIS — Z9989 Dependence on other enabling machines and devices: Secondary | ICD-10-CM | POA: Diagnosis not present

## 2020-08-20 MED ORDER — BREZTRI AEROSPHERE 160-9-4.8 MCG/ACT IN AERO: 2.0000 | INHALATION_SPRAY | Freq: Two times a day (BID) | RESPIRATORY_TRACT | 3 refills | Status: DC

## 2020-08-20 NOTE — Patient Instructions (Signed)
I will see you back in 3 months  Continue with Nucala shots  Try and get back to using a CPAP on a regular basis  We will contact DME supply company to get you a new mask

## 2020-08-20 NOTE — Progress Notes (Signed)
Christian [name]    [id number]    1960-05-15  Primary Care Physician:Johnson, Dalbert Batman, MD  Referring Physician: Ladell Pier, MD [address] [place],  [place] [address]  Chief complaint:   Patient being followed up for asthma, obstructive sleep apnea  HPI:  Diagnosed with OSA over 10 years ago Stopped using machine during episodes of recurrent upper respiratory infections  He is having difficulty with his allergies recently and has not been using his CPAP on a regular basis Recent sleep study did reveal that he still has mild obstructive sleep apnea  He is on Nucala for his asthma  more congestion recently especially with weather changes  Mild shortness of breath with exertion History of secondhand smoke exposure  Usually goes to bed between 10 and 11:30 PM Takes him between 15 and 30 minutes to fall asleep Final awakening between 6 and 7 AM  Never smoker  History of hypertension, asthma, high cholesterol  Is currently on a course of prednisone, uses Advair 250  Outpatient Encounter Medications as of 08/20/2020  Medication Sig  . ACCU-CHEK GUIDE test strip USE 1  TEST STRIP ONCE DAILY TO CHECK BLOOD SUGAR  . albuterol (VENTOLIN HFA) 108 (90 Base) MCG/ACT inhaler INHALE 1 TO 2 PUFFS BY MOUTH EVERY 6 HOURS AS NEEDED FOR WHEEZING FOR SHORTNESS OF BREATH  . benzonatate (TESSALON) 100 MG capsule Take 2 capsules (200 mg total) by mouth 3 (three) times daily as needed.  . Blood Glucose Monitoring Suppl (ONE TOUCH ULTRA 2) w/Device KIT Use as directed 3 times daily E11.9  . Budeson-Glycopyrrol-Formoterol (BREZTRI AEROSPHERE) 160-9-4.8 MCG/ACT AERO Inhale 2 puffs into the lungs 2 (two) times daily.  . cetirizine (ZYRTEC) 10 MG tablet Take 1 tablet (10 mg total) by mouth daily.  Marland Kitchen doxycycline (VIBRA-TABS) 100 MG tablet Take 1 tablet (100 mg total) by mouth 2 (two) times daily.  . fluticasone (FLONASE) 50 MCG/ACT nasal spray Place 2 sprays into both nostrils  daily.  . furosemide (LASIX) 20 MG tablet TAKE 1 TABLET BY MOUTH ONCE DAILY FOR  ONE  WEEK  THEN  AS  NEEDED  FOR  SWELLING  . gabapentin (NEURONTIN) 300 MG capsule Take 1 capsule (300 mg total) by mouth 3 (three) times daily.  . hydrochlorothiazide (HYDRODIURIL) 12.5 MG tablet Take 1 tablet (12.5 mg total) by mouth daily.  Marland Kitchen ipratropium-albuterol (DUONEB) 0.5-2.5 (3) MG/3ML SOLN Take 3 mLs by nebulization every 4 (four) hours as needed. (Patient taking differently: Take 3 mLs by nebulization every 4 (four) hours as needed (for breathing).)  . lisinopril-hydrochlorothiazide (ZESTORETIC) 10-12.5 MG tablet Take 1 tablet by mouth daily.  . mepolizumab (NUCALA) 100 MG/ML SOSY Inject 100 mg into the skin every 28 (twenty-eight) days.  . metFORMIN (GLUCOPHAGE) 500 MG tablet TAKE 1 TABLET BY MOUTH EVERY MORNING  . montelukast (SINGULAIR) 10 MG tablet TAKE 1 TABLET BY MOUTH AT BEDTIME  . pravastatin (PRAVACHOL) 20 MG tablet TAKE 1 TABLET BY MOUTH AT BEDTIME  . predniSONE (DELTASONE) 20 MG tablet Take 1 tablet (20 mg total) by mouth daily.  . TRUEPLUS LANCETS 28G MISC Check glucose 2 times a day   No facility-administered encounter medications on file as of 08/20/2020.    Allergies as of 08/20/2020 - Review Complete 08/20/2020  Allergen Reaction Noted  . Phenergan [promethazine hcl] Itching and Other (See Comments) 09/15/2015    Past Medical History:  Diagnosis Date  . Allergy   . Arthritis    "  left knee" (01/22/2016)  . Asthma   . Chronic lower back pain   . COPD (chronic obstructive pulmonary disease) ([place])   . Dyspnea    with extertion  . High cholesterol   . Hypertension   . Neuropathy   . OSA on CPAP   . Secondhand smoke exposure   . Sleep apnea    wears CPAP, not wearing now  for months  . Type II diabetes mellitus ([place]) dx'd 01/17/2016    Past Surgical History:  Procedure Laterality Date  . ANTERIOR CERVICAL DECOMP/DISCECTOMY FUSION  ~ 1995   "titanium"  . APPENDECTOMY  ~ 1977   . BACK SURGERY     x 2  L 4disectomy  . COLONOSCOPY W/ POLYPECTOMY    . INGUINAL HERNIA REPAIR Right ~ 2000  . LUMBAR DISC SURGERY  ~ 2000; ~2006  . NASAL SINUS SURGERY  ~ 2007  . TOTAL KNEE ARTHROPLASTY Left 08/06/2017   Procedure: LEFT TOTAL KNEE ARTHROPLASTY;  Surgeon: Leandrew Koyanagi, MD;  Location: [facility];  Service: Orthopedics;  Laterality: Left;    Family History  Problem Relation Age of Onset  . Hypertension Mother   . Hypertension Father   . Heart Problems Brother        Heart transplant, pt does not know cause   . Rectal cancer Brother   . Colon cancer Neg Hx   . Esophageal cancer Neg Hx   . Stomach cancer Neg Hx     Social History   Socioeconomic History  . Marital status: Married    Spouse name: Not on file  . Number of children: Not on file  . Years of education: Not on file  . Highest education level: Not on file  Occupational History  . Not on file  Tobacco Use  . Smoking status: Passive Smoke Exposure - Never Smoker  . Smokeless tobacco: Never Used  . Tobacco comment: passive cig smoking at work  1994- 2012  Vaping Use  . Vaping Use: Never used  Substance and Sexual Activity  . Alcohol use: No  . Drug use: No  . Sexual activity: Yes  Other Topics Concern  . Not on file  Social History Narrative  . Not on file   Social Determinants of Health   Financial Resource Strain: Not on file  Food Insecurity: Not on file  Transportation Needs: Not on file  Physical Activity: Not on file  Stress: Not on file  Social Connections: Not on file  Intimate Partner Violence: Not on file    Review of Systems  Constitutional: Negative.   HENT: Negative.   Eyes: Negative.   Respiratory: Positive for shortness of breath.   Cardiovascular: Negative.   Gastrointestinal: Negative.   Endocrine: Negative.   Genitourinary: Negative.     Vitals:   08/20/20 0911  BP: 132/86  Pulse: 69  Temp: 98 F (36.7 C)  SpO2: 97%     Physical Exam Constitutional:       Appearance: Normal appearance. He is well-developed.  HENT:     Head: Normocephalic and atraumatic.     Mouth/Throat:     Mouth: Mucous membranes are moist.  Eyes:     General:        Right eye: No discharge.        Left eye: No discharge.  Neck:     Thyroid: No thyromegaly.     Trachea: No tracheal deviation.  Cardiovascular:     Rate and Rhythm: Normal rate and  regular rhythm.  Pulmonary:     Effort: Pulmonary effort is normal. No respiratory distress.     Breath sounds: Normal breath sounds. No stridor. No wheezing, rhonchi or rales.  Musculoskeletal:     Cervical back: No rigidity or tenderness.  Neurological:     Mental Status: He is alert.  Psychiatric:        Mood and Affect: Mood normal.    Results of the Epworth flowsheet 11/25/2018  Sitting and reading 0  Watching TV 1  Sitting, inactive in a public place (e.g. a theatre or a meeting) 0  As a passenger in a car for an hour without a break 0  Lying down to rest in the afternoon when circumstances permit 3  Sitting and talking to someone 3  Sitting quietly after a lunch without alcohol 0  In a car, while stopped for a few minutes in traffic 0  Total score 7    Data Reviewed: Previous sleep study not available to be reviewed Most recent study shows mild obstructive sleep apnea  Compliance data shows very poor compliance with CPAP therapy 23% use  Assessment:  Mild obstructive sleep apnea  -Encouraged to get back to using CPAP on a regular basis -Still having problems with congestion and difficulty with the mask  Asthma with exacerbation Eosinophilic asthma -To continue on Nucala -Avoid known triggers -Recently called in a prescription of prednisone for him  Daytime sleepiness -Symptoms better controlled at present  Shortness of breath -Stable status  Plan/Recommendations: Continue CPAP as tolerated -Encouraged to try and use this on a regular basis  Graded exercise as tolerated Avoid known  triggers  Continue Nucala -Every 28 days  Follow-up in 6 months   Sherrilyn Rist MD [facility] 08/20/2020, 9:17 AM  CC: Ladell Pier, MD

## 2020-08-22 ENCOUNTER — Other Ambulatory Visit: Payer: Self-pay | Admitting: Acute Care

## 2020-08-22 DIAGNOSIS — R0981 Nasal congestion: Secondary | ICD-10-CM

## 2020-08-22 MED ORDER — CETIRIZINE HCL 10 MG PO TABS: 10.0000 mg | ORAL_TABLET | Freq: Every day | ORAL | 2 refills | Status: DC

## 2020-09-06 ENCOUNTER — Ambulatory Visit: Payer: Medicare Other | Admitting: Orthopaedic Surgery

## 2020-10-14 ENCOUNTER — Other Ambulatory Visit: Payer: Self-pay | Admitting: Internal Medicine

## 2020-10-14 NOTE — Telephone Encounter (Signed)
Pt needs appointment.  Last RF: Lasix: 08/06/20            Gabapentin: 10/18/19  Pt was supposed to f/u 9/21. Will send pt MyChart message to call office and make appt. Requested Prescriptions  Pending Prescriptions Disp Refills   gabapentin (NEURONTIN) 300 MG capsule [Pharmacy Med Name: Gabapentin 300 MG Oral Capsule] 90 capsule 0    Sig: TAKE 1 CAPSULE BY MOUTH THREE TIMES DAILY      Neurology: Anticonvulsants - gabapentin Passed - 10/14/2020 10:30 AM      Passed - Valid encounter within last 12 months    Recent Outpatient Visits           7 months ago Type 2 diabetes mellitus without complication, without long-term current use of insulin [facility])   [place], [place], [place]   10 months ago Hospital discharge follow-up   [place] Ladell Pier, MD   11 months ago Sinusitis, unspecified chronicity, unspecified location   [place], Charlane Ferretti, MD   12 months ago Acute left ankle pain   [place], Deborah B, MD   1 year ago Type 2 diabetes mellitus without complication, without long-term current use of insulin [facility])   [place], Levada Dy M, [place]                  furosemide (LASIX) 20 MG tablet [Pharmacy Med Name: Furosemide 20 MG Oral Tablet] 30 tablet 0    Sig: TAKE 1 TABLET BY MOUTH ONCE DAILY FOR 7 DAYS THEN AS NEEDED FOR  SWELLING      Cardiovascular:  Diuretics - Loop Failed - 10/14/2020 10:30 AM      Failed - Ca in normal range and within 360 days    Calcium  Date Value Ref Range Status  12/15/2019 8.3 (L) 8.7 - 10.2 mg/dL Final          Failed - Valid encounter within last 6 months    Recent Outpatient Visits           7 months ago Type 2 diabetes mellitus without complication, without long-term current use of insulin [facility])   [place],  [place], [place]   10 months ago Hospital discharge follow-up   [place], Deborah B, MD   11 months ago Sinusitis, unspecified chronicity, unspecified location   [place], Charlane Ferretti, MD   12 months ago Acute left ankle pain   [place], Deborah B, MD   1 year ago Type 2 diabetes mellitus without complication, without long-term current use of insulin [facility])   [place], Levada Dy M, [place]                Passed - K in normal range and within 360 days    Potassium  Date Value Ref Range Status  12/15/2019 3.8 3.5 - 5.2 mmol/L Final          Passed - Na in normal range and within 360 days    Sodium  Date Value Ref Range Status  12/15/2019 142 134 - 144 mmol/L Final          Passed - Cr in normal range and within 360 days    Creat  Date  Value Ref Range Status  01/14/2016 1.09 0.70 - 1.33 mg/dL Final    Comment:      For patients > or = 61 years of age: The upper reference limit for Creatinine is approximately 13% higher for people identified as African-American.      Creatinine, Ser  Date Value Ref Range Status  12/15/2019 1.07 0.76 - 1.27 mg/dL Final   Creatinine, Urine  Date Value Ref Range Status  04/02/2016 164 20 - 370 mg/dL Final          Passed - Last BP in normal range    BP Readings from Last 1 Encounters:  08/20/20 132/86

## 2020-10-24 ENCOUNTER — Encounter: Payer: Medicare Other | Admitting: *Deleted

## 2020-10-24 ENCOUNTER — Other Ambulatory Visit: Payer: Self-pay

## 2020-10-24 MED ORDER — FUROSEMIDE 20 MG PO TABS: ORAL_TABLET | ORAL | 0 refills | Status: DC

## 2020-10-24 NOTE — Telephone Encounter (Signed)
Notes to clinic:  Patient called  in requesting short supply of at lease 5 lasix until he has his appt 07/27 and is going out of town also. Please call back   Requested Prescriptions  Pending Prescriptions Disp Refills   furosemide (LASIX) 20 MG tablet 30 tablet 0    Sig: TAKE 1 TABLET BY MOUTH ONCE DAILY FOR  ONE  WEEK  THEN  AS  NEEDED  FOR  SWELLING      Cardiovascular:  Diuretics - Loop Failed - 10/24/2020 11:09 AM      Failed - Ca in normal range and within 360 days    Calcium  Date Value Ref Range Status  12/15/2019 8.3 (L) 8.7 - 10.2 mg/dL Final          Failed - Valid encounter within last 6 months    Recent Outpatient Visits           7 months ago Type 2 diabetes mellitus without complication, without long-term current use of insulin [name])   [name], [place], [place]   10 months ago [name] discharge follow-up   [place], Deborah B, MD   11 months ago Sinusitis, unspecified chronicity, unspecified location   [place], [place]ong, MD   1 year ago Acute left ankle pain   [place], Deborah B, MD   1 year ago Type 2 diabetes mellitus without complication, without long-term current use of insulin [name])   Christian [name], [place], [place]       Future Appointments             In 4 weeks [place], [name], PA-C [name] - K in normal range and within 360 days    Potassium  Date Value Ref Range Status  12/15/2019 3.8 3.5 - 5.2 mmol/L Final          Passed - Na in normal range and within 360 days    Sodium  Date Value Ref Range Status  12/15/2019 142 134 - 144 mmol/L Final          Passed - Cr in normal range and within 360 days    Creat  Date Value Ref Range Status  01/14/2016 1.09 0.70 - 1.33 mg/dL Final     Comment:      For patients > or = 61 years of age: The upper reference limit for Creatinine is approximately 13% higher for people identified as African-American.      Creatinine, Ser  Date Value Ref Range Status  12/15/2019 1.07 0.76 - 1.27 mg/dL Final   Creatinine, Urine  Date Value Ref Range Status  04/02/2016 164 20 - 370 mg/dL Final          Passed - Last BP in normal range    BP Readings from Last 1 Encounters:  08/20/20 132/86           Refused Prescriptions Disp Refills   gabapentin (NEURONTIN) 300 MG capsule [Pharmacy Med Name: Gabapentin 300 MG Oral Capsule] 90 capsule 0    Sig: TAKE 1 CAPSULE BY MOUTH THREE TIMES DAILY      Neurology: Anticonvulsants - gabapentin Passed - 10/24/2020 11:09 AM      Passed - Valid encounter within last 12 months  Recent Outpatient Visits           7 months ago Type 2 diabetes mellitus without complication, without long-term current use of insulin [name])   [name], [name], [place]   10 months ago [name] discharge follow-up   [place] Ladell Pier, MD   11 months ago Sinusitis, unspecified chronicity, unspecified location   [place], Charlane Ferretti, MD   1 year ago Acute left ankle pain   [place], Deborah B, MD   1 year ago Type 2 diabetes mellitus without complication, without long-term current use of insulin ([place])   [name], [name], [place]       Future Appointments             In 4 weeks [place], [name], PA-C [place]               furosemide (LASIX) 20 MG tablet [employer] Med Name: Furosemide 20 MG Oral Tablet] 30 tablet 0    Sig: TAKE 1 TABLET BY MOUTH ONCE DAILY FOR 7 DAYS THEN AS NEEDED FOR  SWELLING      Cardiovascular:  Diuretics - Loop Failed - 10/24/2020 11:09 AM      Failed  - Ca in normal range and within 360 days    Calcium  Date Value Ref Range Status  12/15/2019 8.3 (L) 8.7 - 10.2 mg/dL Final          Failed - Valid encounter within last 6 months    Recent Outpatient Visits           7 months ago Type 2 diabetes mellitus without complication, without long-term current use of insulin [name])   [place], [place], [place]   10 months ago [name] discharge follow-up   [place], MD   11 months ago Sinusitis, unspecified chronicity, unspecified location   [place], Enobong, MD   1 year ago Acute left ankle pain   [place] [name], Deborah B, MD   1 year ago Type 2 diabetes mellitus without complication, without long-term current use of insulin [name])   [facility] [name], [place], [place]       Future Appointments             In 4 weeks [place] [name], [name], PA-C [place] - K in normal range and within 360 days    Potassium  Date Value Ref Range Status  12/15/2019 3.8 3.5 - 5.2 mmol/L Final          Passed - Na in normal range and within 360 days    Sodium  Date Value Ref Range Status  12/15/2019 142 134 - 144 mmol/L Final          Passed - Cr in normal range and within 360 days    Creat  Date Value Ref Range Status  01/14/2016 1.09 0.70 - 1.33 mg/dL Final    Comment:      For patients > or = 61 years of age: The upper reference limit for Creatinine is approximately 13% higher for people identified as African-American.  Creatinine, Ser  Date Value Ref Range Status  12/15/2019 1.07 0.76 - 1.27 mg/dL Final   Creatinine, Urine  Date Value Ref Range Status  04/02/2016 164 20 - 370 mg/dL Final          Passed - Last BP in normal range    BP Readings from Last 1 Encounters:  08/20/20  132/86

## 2020-10-24 NOTE — Addendum Note (Signed)
Addended by: Jefferson Fuel on: 10/24/2020 11:09 AM   Modules accepted: Orders

## 2020-10-24 NOTE — Telephone Encounter (Signed)
Patient called  in requesting short supply of at lease 5 lasix until he has his appt 07/27 and is going out of town also. Please call back

## 2020-10-24 NOTE — Addendum Note (Signed)
Addended by: Daisy Blossom, Annie Main L on: 10/24/2020 03:44 PM   Modules accepted: Orders

## 2020-11-21 ENCOUNTER — Ambulatory Visit: Payer: Medicare Other | Attending: Physician Assistant | Admitting: Physician Assistant

## 2020-11-21 ENCOUNTER — Other Ambulatory Visit: Payer: Self-pay

## 2020-11-21 ENCOUNTER — Telehealth: Payer: Medicare Other | Admitting: Physician Assistant

## 2020-11-21 ENCOUNTER — Encounter: Payer: Self-pay | Admitting: Physician Assistant

## 2020-11-21 DIAGNOSIS — J321 Chronic frontal sinusitis: Secondary | ICD-10-CM | POA: Diagnosis not present

## 2020-11-21 DIAGNOSIS — E1140 Type 2 diabetes mellitus with diabetic neuropathy, unspecified: Secondary | ICD-10-CM

## 2020-11-21 DIAGNOSIS — E119 Type 2 diabetes mellitus without complications: Secondary | ICD-10-CM | POA: Diagnosis not present

## 2020-11-21 DIAGNOSIS — J4541 Moderate persistent asthma with (acute) exacerbation: Secondary | ICD-10-CM

## 2020-11-21 DIAGNOSIS — I10 Essential (primary) hypertension: Secondary | ICD-10-CM

## 2020-11-21 DIAGNOSIS — R0981 Nasal congestion: Secondary | ICD-10-CM | POA: Diagnosis not present

## 2020-11-21 DIAGNOSIS — E785 Hyperlipidemia, unspecified: Secondary | ICD-10-CM | POA: Diagnosis not present

## 2020-11-21 DIAGNOSIS — J8283 Eosinophilic asthma: Secondary | ICD-10-CM | POA: Diagnosis not present

## 2020-11-21 MED ORDER — METFORMIN HCL 500 MG PO TABS: 500.0000 mg | ORAL_TABLET | Freq: Every morning | ORAL | 3 refills | Status: DC

## 2020-11-21 MED ORDER — FUROSEMIDE 20 MG PO TABS: ORAL_TABLET | ORAL | 3 refills | Status: DC

## 2020-11-21 MED ORDER — ALBUTEROL SULFATE HFA 108 (90 BASE) MCG/ACT IN AERS: INHALATION_SPRAY | RESPIRATORY_TRACT | 1 refills | Status: DC

## 2020-11-21 MED ORDER — LISINOPRIL-HYDROCHLOROTHIAZIDE 10-12.5 MG PO TABS: 1.0000 | ORAL_TABLET | Freq: Every day | ORAL | 2 refills | Status: DC

## 2020-11-21 MED ORDER — MONTELUKAST SODIUM 10 MG PO TABS: 10.0000 mg | ORAL_TABLET | Freq: Every day | ORAL | 3 refills | Status: DC

## 2020-11-21 MED ORDER — IPRATROPIUM-ALBUTEROL 0.5-2.5 (3) MG/3ML IN SOLN: 3.0000 mL | RESPIRATORY_TRACT | 1 refills | Status: DC | PRN

## 2020-11-21 MED ORDER — PRAVASTATIN SODIUM 20 MG PO TABS: 20.0000 mg | ORAL_TABLET | Freq: Every day | ORAL | 3 refills | Status: DC

## 2020-11-21 MED ORDER — GABAPENTIN 300 MG PO CAPS: 300.0000 mg | ORAL_CAPSULE | Freq: Three times a day (TID) | ORAL | 3 refills | Status: DC

## 2020-11-21 MED ORDER — ACCU-CHEK GUIDE VI STRP: ORAL_STRIP | 1 refills | Status: DC

## 2020-11-21 MED ORDER — CETIRIZINE HCL 10 MG PO TABS: 10.0000 mg | ORAL_TABLET | Freq: Every day | ORAL | 2 refills | Status: DC

## 2020-11-21 MED ORDER — HYDROCHLOROTHIAZIDE 12.5 MG PO TABS: 12.5000 mg | ORAL_TABLET | Freq: Every day | ORAL | 3 refills | Status: DC

## 2020-11-21 NOTE — Progress Notes (Signed)
Virtual Visit via Telephone Note  I connected with [employer] [place] on 11/21/20 at 10:10 AM EDT by telephone and verified that I am speaking with the correct person using two identifiers.  Location: Patient: home  Provider: [facility] office   I discussed the limitations, risks, security and privacy concerns of performing an evaluation and management service by telephone and the availability of in person appointments. I also discussed with the patient that there may be a patient responsible charge related to this service. The patient expressed understanding and agreed to proceed.   History of Present Illness:  he needs RF on his meds.  No labs in almost 1 year.  Blood su running under 120.  No CP/SO/HA/dizziness.  Says BP 120-130/80s.      Observations/Objective:  NAD.  A&Ox3   Assessment and Plan: 1. Hyperlipidemia, unspecified hyperlipidemia type - pravastatin (PRAVACHOL) 20 MG tablet; Take 1 tablet (20 mg total) by mouth at bedtime.  Dispense: 30 tablet; Refill: 3 - Lipid panel; Future  2. Eosinophilic asthma - montelukast (SINGULAIR) 10 MG tablet; Take 1 tablet (10 mg total) by mouth at bedtime.  Dispense: 30 tablet; Refill: 3 - albuterol (VENTOLIN HFA) 108 (90 Base) MCG/ACT inhaler; 1-2 puffs as needed for wheezing  Dispense: 18 g; Refill: 1  3. Type 2 diabetes mellitus without complication, without long-term current use of insulin (HCC) Controlled based on home numbers - metFORMIN (GLUCOPHAGE) 500 MG tablet; Take 1 tablet (500 mg total) by mouth every morning.  Dispense: 30 tablet; Refill: 3 - gabapentin (NEURONTIN) 300 MG capsule; Take 1 capsule (300 mg total) by mouth 3 (three) times daily.  Dispense: 90 capsule; Refill: 3 - Hemoglobin A1c; Future - Comprehensive metabolic panel; Future  4. Chronic frontal sinusitis Continue flonase  5. Sinus congestion - cetirizine (ZYRTEC) 10 MG tablet; Take 1 tablet (10 mg total) by mouth daily.  Dispense: 90 tablet; Refill: 2  6. Type 2  diabetes mellitus with diabetic neuropathy, without long-term current use of insulin (HCC) - glucose blood (ACCU-CHEK GUIDE) test strip; USE 1  TEST STRIP ONCE DAILY TO CHECK BLOOD SUGAR  Dispense: 100 each; Refill: 1  7. Essential hypertension Controlled based on OOO readings - hydrochlorothiazide (HYDRODIURIL) 12.5 MG tablet; Take 1 tablet (12.5 mg total) by mouth daily.  Dispense: 90 tablet; Refill: 3 - lisinopril-hydrochlorothiazide (ZESTORETIC) 10-12.5 MG tablet; Take 1 tablet by mouth daily.  Dispense: 90 tablet; Refill: 2  8. Moderate persistent asthma with acute exacerbation - ipratropium-albuterol (DUONEB) 0.5-2.5 (3) MG/3ML SOLN; Take 3 mLs by nebulization every 4 (four) hours as needed (for breathing).  Dispense: 15 mL; Refill: 1  Follow Up Instructions: See PCP in 3 months    I discussed the assessment and treatment plan with the patient. The patient was provided an opportunity to ask questions and all were answered. The patient agreed with the plan and demonstrated an understanding of the instructions.   The patient was advised to call back or seek an in-person evaluation if the symptoms worsen or if the condition fails to improve as anticipated.  I provided 15 minutes of non-face-to-face time during this encounter.   Freeman Caldron, PA-C  Patient ID: Christian [name], male   DOB: 1960-07-06, 60 y.o.   MRN: [id number]

## 2020-12-03 ENCOUNTER — Ambulatory Visit: Payer: Medicare Other | Admitting: Nurse Practitioner

## 2020-12-03 ENCOUNTER — Other Ambulatory Visit: Payer: Self-pay

## 2020-12-03 ENCOUNTER — Ambulatory Visit: Payer: Medicare Other | Attending: Nurse Practitioner | Admitting: Nurse Practitioner

## 2020-12-03 ENCOUNTER — Encounter: Payer: Self-pay | Admitting: Nurse Practitioner

## 2020-12-03 ENCOUNTER — Telehealth: Payer: Self-pay | Admitting: Internal Medicine

## 2020-12-03 DIAGNOSIS — J069 Acute upper respiratory infection, unspecified: Secondary | ICD-10-CM | POA: Diagnosis not present

## 2020-12-03 NOTE — Progress Notes (Signed)
Pt states that he has had cough and sob for 2 days requesting anabiotic or cough medication  Has tried breathing treatment they have been unsuccessful.

## 2020-12-03 NOTE — Progress Notes (Signed)
Virtual Visit via Telephone Note Due to national recommendations of social distancing due to [place] 19, telehealth visit is felt to be most appropriate for this patient at this time.  I discussed the limitations, risks, security and privacy concerns of performing an evaluation and management service by telephone and the availability of in person appointments. I also discussed with the patient that there may be a patient responsible charge related to this service. The patient expressed understanding and agreed to proceed.    I connected with Christian [name] on 12/03/20  at   2:10 PM EDT  EDT by telephone and verified that I am speaking with the correct person using two identifiers.  Location of Patient: Private Residence   Location of Provider: [place] participating in Telemedicine visit: Christian Rankins FNP-BC [place]    History of Present Illness: Telemedicine visit for: Cold symptoms  Upper Respiratory Infection: Patient complains of symptoms of a URI. Symptoms include congestion, cough, sore throat, and fatigue, myalgia, periorbital pain, and shortness of breath . Onset of symptoms was 1 day ago, gradually worsening since that time. He also c/o achiness and cough described as productive for the past 1 day .  He is drinking plenty of fluids. Evaluation to date: none. Treatment to date: cough suppressants. He denies being around any large crowds or anyone with COVID over the past 3 days. Nebulizer only providing temporary relief of symptoms.  He took a covid test 4 days ago and it was negative.    Past Medical History:  Diagnosis Date   Allergy    Arthritis    "left knee" (01/22/2016)   Asthma    Chronic lower back pain    COPD (chronic obstructive pulmonary disease) (HCC)    Dyspnea    with extertion   High cholesterol    Hypertension    Neuropathy    OSA on CPAP    Secondhand smoke exposure    Sleep apnea    wears  CPAP, not wearing now  for months   Type II diabetes mellitus ([place]) dx'd 01/17/2016    Past Surgical History:  Procedure Laterality Date   ANTERIOR CERVICAL DECOMP/DISCECTOMY FUSION  ~ 1995   "titanium"   APPENDECTOMY  ~ [place]     x 2  L 4disectomy   COLONOSCOPY W/ POLYPECTOMY     INGUINAL HERNIA REPAIR Right ~ [place]  ~ 2000; ~2006   NASAL SINUS SURGERY  ~ 2007   TOTAL KNEE ARTHROPLASTY Left 08/06/2017   Procedure: LEFT TOTAL KNEE ARTHROPLASTY;  Surgeon: Leandrew Koyanagi, MD;  Location: [place];  Service: Orthopedics;  Laterality: Left;    Family History  Problem Relation Age of Onset   Hypertension Mother    Hypertension Father    Heart Problems Brother        Heart transplant, pt does not know cause    Rectal cancer Brother    Colon cancer Neg Hx    Esophageal cancer Neg Hx    Stomach cancer Neg Hx     Social History   Socioeconomic History   Marital status: Married    Spouse name: Not on file   Number of children: Not on file   Years of education: Not on file   Highest education level: Not on file  Occupational History   Not on file  Tobacco Use   Smoking status: Passive Smoke Exposure -  Never Smoker   Smokeless tobacco: Never   Tobacco comments:    passive cig smoking at work  1994- 2012  Vaping Use   Vaping Use: Never used  Substance and Sexual Activity   Alcohol use: No   Drug use: No   Sexual activity: Yes  Other Topics Concern   Not on file  Social History Narrative   Not on file   Social Determinants of Health   Financial Resource Strain: Not on file  Food Insecurity: Not on file  Transportation Needs: Not on file  Physical Activity: Not on file  Stress: Not on file  Social Connections: Not on file     Observations/Objective: Awake, alert and oriented x 3   Review of Systems  Constitutional:  Positive for malaise/fatigue. Negative for fever and weight loss.  HENT:  Positive for congestion. Negative for  nosebleeds.   Eyes: Negative.  Negative for blurred vision, double vision and photophobia.  Respiratory:  Positive for cough and sputum production. Negative for shortness of breath.   Cardiovascular: Negative.  Negative for chest pain, palpitations and leg swelling.  Gastrointestinal: Negative.  Negative for heartburn, nausea and vomiting.  Musculoskeletal: Negative.  Negative for myalgias.  Neurological: Negative.  Negative for dizziness, focal weakness, seizures and headaches.  Psychiatric/Behavioral: Negative.  Negative for suicidal ideas.    Assessment and Plan: Christian [name] was seen today for cough and asthma.  Diagnoses and all orders for this visit:  Viral upper respiratory tract infection  COVID TEST PENDING  Follow Up Instructions Return in about 6 weeks (around 01/14/2021) for PCP.     I discussed the assessment and treatment plan with the patient. The patient was provided an opportunity to ask questions and all were answered. The patient agreed with the plan and demonstrated an understanding of the instructions.   The patient was advised to call back or seek an in-person evaluation if the symptoms worsen or if the condition fails to improve as anticipated.  I provided 10 minutes of non-face-to-face time during this encounter including median intraservice time, reviewing previous notes, labs, imaging, medications and explaining diagnosis and management.  Gildardo Pounds, FNP-BC

## 2020-12-03 NOTE — Telephone Encounter (Signed)
Copied from [place] [id number]. Topic: General - Other >> Dec 03, 2020  2:54 PM Leward Quan A wrote: Reason for CRM: Patient called to inform Archie Patten that he took the Covid test and it was negative. Any questions please call Ph# [phone]

## 2020-12-04 ENCOUNTER — Other Ambulatory Visit: Payer: Self-pay | Admitting: Nurse Practitioner

## 2020-12-04 DIAGNOSIS — R059 Cough, unspecified: Secondary | ICD-10-CM

## 2020-12-04 MED ORDER — TUSSIN DM 100-10 MG/5ML PO LIQD: 10.0000 mL | ORAL | 0 refills | Status: DC | PRN

## 2020-12-04 MED ORDER — BENZONATATE 100 MG PO CAPS: 200.0000 mg | ORAL_CAPSULE | Freq: Three times a day (TID) | ORAL | 0 refills | Status: DC | PRN

## 2020-12-04 NOTE — Telephone Encounter (Signed)
Pt calling in again regarding this. He states that he was supposed to have medication sent in after his visit to help with his symptoms. Please advise.

## 2020-12-04 NOTE — Telephone Encounter (Signed)
Medications have been sent

## 2020-12-07 ENCOUNTER — Telehealth: Payer: Self-pay | Admitting: Internal Medicine

## 2020-12-07 NOTE — Telephone Encounter (Signed)
Patient need to be scheduled for an appointment with Dr. Wynetta Emery in Sept or Oct. Left vm for patient to call [phone] to schedule

## 2020-12-07 NOTE — Telephone Encounter (Signed)
-----   Message from [place], [place] sent at 11/21/2020 10:31 AM EDT ----- See PCP in 3 months

## 2021-01-15 ENCOUNTER — Telehealth: Payer: Self-pay | Admitting: Internal Medicine

## 2021-01-15 DIAGNOSIS — I10 Essential (primary) hypertension: Secondary | ICD-10-CM

## 2021-01-15 DIAGNOSIS — J8283 Eosinophilic asthma: Secondary | ICD-10-CM

## 2021-01-15 DIAGNOSIS — J4541 Moderate persistent asthma with (acute) exacerbation: Secondary | ICD-10-CM

## 2021-01-15 DIAGNOSIS — E785 Hyperlipidemia, unspecified: Secondary | ICD-10-CM

## 2021-01-15 DIAGNOSIS — E119 Type 2 diabetes mellitus without complications: Secondary | ICD-10-CM

## 2021-01-15 MED ORDER — GABAPENTIN 300 MG PO CAPS
300.0000 mg | ORAL_CAPSULE | Freq: Three times a day (TID) | ORAL | 3 refills | Status: DC
Start: 2021-01-15 — End: 2021-02-06

## 2021-01-15 MED ORDER — IPRATROPIUM-ALBUTEROL 0.5-2.5 (3) MG/3ML IN SOLN
3.0000 mL | RESPIRATORY_TRACT | 1 refills | Status: DC | PRN
Start: 2021-01-15 — End: 2021-05-07

## 2021-01-15 MED ORDER — PRAVASTATIN SODIUM 20 MG PO TABS: 20.0000 mg | ORAL_TABLET | Freq: Every day | ORAL | 3 refills | Status: DC

## 2021-01-15 MED ORDER — FUROSEMIDE 20 MG PO TABS: ORAL_TABLET | ORAL | 3 refills | Status: DC

## 2021-01-15 NOTE — Telephone Encounter (Signed)
Medication Refill - Medication: gabapentin (NEURONTIN) 300 MG capsule  pravastatin (PRAVACHOL) 20 MG tablet cetirizine (ZYRTEC) 10 MG tablet  ipratropium-albuterol (DUONEB) 0.5-2.5 (3) MG/3ML SOLN lisinopril-hydrochlorothiazide (ZESTORETIC) 10-12.5 MG tablet montelukast (SINGULAIR) 10 MG tablet metFORMIN (GLUCOPHAGE) 500 MG tablet  furosemide (LASIX) 20 MG tablet hydrochlorothiazide (HYDRODIURIL) 12.5 MG tablet  Has the patient contacted their pharmacy? Yes.   (Agent: If no, request that the patient contact the pharmacy for the refill.) (Agent: If yes, when and what did the pharmacy advise?)  Preferred Pharmacy (with phone number or street name):  [facility] [place] [place]  [address] [place] [address]  Phone: [phone] Fax: [phone]   Has the patient been seen for an appointment in the last year OR does the patient have an upcoming appointment? Yes.    Agent: Please be advised that RX refills may take up to 3 business days. We ask that you follow-up with your pharmacy.

## 2021-01-15 NOTE — Telephone Encounter (Signed)
Patient dropped off [employer] form to be signed by Dr. Wynetta Emery. Patient stated he need it by Thursday. Explained to patient that paperwork take 7-14 days and not guaranteed to have by Thursday.

## 2021-01-15 NOTE — Telephone Encounter (Signed)
Requested medication (s) are due for refill today: yes  Requested medication (s) are on the active medication list: yes  Last refill: hydrodiuril- 11/21/20 #90 3 refills , zestoretic- 11/21/20 #90 2 refills , metformin, singular- 11/21/20 #30 3 refills   Future visit scheduled: no   Notes to clinic:  last labs 09/14/2018 and 12/15/19. Do you want to refill Rx?      Requested Prescriptions  Pending Prescriptions Disp Refills   hydrochlorothiazide (HYDRODIURIL) 12.5 MG tablet 90 tablet 3    Sig: Take 1 tablet (12.5 mg total) by mouth daily.     Cardiovascular: Diuretics - Thiazide Failed - 01/15/2021  2:50 PM      Failed - Ca in normal range and within 360 days    Calcium  Date Value Ref Range Status  12/15/2019 8.3 (L) 8.7 - 10.2 mg/dL Final          Failed - Cr in normal range and within 360 days    Creat  Date Value Ref Range Status  01/14/2016 1.09 0.70 - 1.33 mg/dL Final    Comment:      For patients > or = 61 years of age: The upper reference limit for Creatinine is approximately 13% higher for people identified as African-American.      Creatinine, Ser  Date Value Ref Range Status  12/15/2019 1.07 0.76 - 1.27 mg/dL Final   Creatinine, Urine  Date Value Ref Range Status  04/02/2016 164 20 - 370 mg/dL Final          Failed - K in normal range and within 360 days    Potassium  Date Value Ref Range Status  12/15/2019 3.8 3.5 - 5.2 mmol/L Final          Failed - Na in normal range and within 360 days    Sodium  Date Value Ref Range Status  12/15/2019 142 134 - 144 mmol/L Final          Passed - Last BP in normal range    BP Readings from Last 1 Encounters:  08/20/20 132/86          Passed - Valid encounter within last 6 months    Recent Outpatient Visits           1 month ago Viral upper respiratory tract infection   [place], [place] W, NP   1 month ago Hyperlipidemia, unspecified hyperlipidemia type   [place], [place], [place]   10 months ago Type 2 diabetes mellitus without complication, without long-term current use of insulin [facility])   [place], [name], [place]   1 year ago Hospital discharge follow-up   [place] Ladell Pier, MD   1 year ago Sinusitis, unspecified chronicity, unspecified location   [place], [place], MD               lisinopril-hydrochlorothiazide (ZESTORETIC) 10-12.5 MG tablet 90 tablet 2    Sig: Take 1 tablet by mouth daily.     Cardiovascular:  ACEI + Diuretic Combos Failed - 01/15/2021  2:50 PM      Failed - Na in normal range and within 180 days    Sodium  Date Value Ref Range Status  12/15/2019 142 134 - 144 mmol/L Final          Failed - K in normal range  and within 180 days    Potassium  Date Value Ref Range Status  12/15/2019 3.8 3.5 - 5.2 mmol/L Final          Failed - Cr in normal range and within 180 days    Creat  Date Value Ref Range Status  01/14/2016 1.09 0.70 - 1.33 mg/dL Final    Comment:      For patients > or = 61 years of age: The upper reference limit for Creatinine is approximately 13% higher for people identified as African-American.      Creatinine, Ser  Date Value Ref Range Status  12/15/2019 1.07 0.76 - 1.27 mg/dL Final   Creatinine, Urine  Date Value Ref Range Status  04/02/2016 164 20 - 370 mg/dL Final          Failed - Ca in normal range and within 180 days    Calcium  Date Value Ref Range Status  12/15/2019 8.3 (L) 8.7 - 10.2 mg/dL Final          Passed - Patient is not pregnant      Passed - Last BP in normal range    BP Readings from Last 1 Encounters:  08/20/20 132/86          Passed - Valid encounter within last 6 months    Recent Outpatient Visits           1 month ago Viral upper respiratory tract infection   [place], [place] W, NP   1 month ago Hyperlipidemia, unspecified hyperlipidemia type   [place], [place], [place]   10 months ago Type 2 diabetes mellitus without complication, without long-term current use of insulin [facility])   [place], [name], [place]   1 year ago Hospital discharge follow-up   [place] Ladell Pier, MD   1 year ago Sinusitis, unspecified chronicity, unspecified location   [place], Charlane Ferretti, MD               metFORMIN (GLUCOPHAGE) 500 MG tablet 30 tablet 3    Sig: Take 1 tablet (500 mg total) by mouth every morning.     Endocrinology:  Diabetes - Biguanides Failed - 01/15/2021  2:50 PM      Failed - Cr in normal range and within 360 days    Creat  Date Value Ref Range Status  01/14/2016 1.09 0.70 - 1.33 mg/dL Final    Comment:      For patients > or = 61 years of age: The upper reference limit for Creatinine is approximately 13% higher for people identified as African-American.      Creatinine, Ser  Date Value Ref Range Status  12/15/2019 1.07 0.76 - 1.27 mg/dL Final   Creatinine, Urine  Date Value Ref Range Status  04/02/2016 164 20 - 370 mg/dL Final          Failed - HBA1C is between 0 and 7.9 and within 180 days    HbA1c, POC (prediabetic range)  Date Value Ref Range Status  06/20/2019 6.3 5.7 - 6.4 % Final   Hgb A1c MFr Bld  Date Value Ref Range Status  12/02/2019 6.3 (H) 4.8 - 5.6 % Final    Comment:    (NOTE) Pre diabetes:          5.7%-6.4%  Diabetes:              >  6.4%  Glycemic control for   <7.0% adults with diabetes           Failed - AA eGFR in normal range and within 360 days    GFR, Est African American  Date Value Ref Range Status  01/14/2016 87 >=60 mL/min Final   GFR calc Af Amer  Date Value Ref Range Status  12/15/2019 87 >59 mL/min/1.73  Final    Comment:    **[facility] currently reports eGFR in compliance with the current**   recommendations of the [employer]. [facility] will   update reporting as new guidelines are published from the NKF-ASN   Task force.    GFR, Est Non African American  Date Value Ref Range Status  01/14/2016 75 >=60 mL/min Final   GFR calc non Af Amer  Date Value Ref Range Status  12/15/2019 76 >59 mL/min/1.73 Final          Passed - Valid encounter within last 6 months    Recent Outpatient Visits           1 month ago Viral upper respiratory tract infection   [place], [place] W, NP   1 month ago Hyperlipidemia, unspecified hyperlipidemia type   [place], Levada Dy M, [place]   10 months ago Type 2 diabetes mellitus without complication, without long-term current use of insulin [facility])   [place], [name], [place]   1 year ago Hospital discharge follow-up   [place] Ladell Pier, MD   1 year ago Sinusitis, unspecified chronicity, unspecified location   [place], Charlane Ferretti, MD               montelukast (SINGULAIR) 10 MG tablet 30 tablet 3    Sig: Take 1 tablet (10 mg total) by mouth at bedtime.     Pulmonology:  Leukotriene Inhibitors Passed - 01/15/2021  2:50 PM      Passed - Valid encounter within last 12 months    Recent Outpatient Visits           1 month ago Viral upper respiratory tract infection   [place], [place] W, NP   1 month ago Hyperlipidemia, unspecified hyperlipidemia type   [place], Levada Dy M, [place]   10 months ago Type 2 diabetes mellitus without complication, without long-term current use of insulin [facility])   [place], [name], [place]   1 year ago  Hospital discharge follow-up   [place] Ladell Pier, MD   1 year ago Sinusitis, unspecified chronicity, unspecified location   [place], Charlane Ferretti, MD              Signed Prescriptions Disp Refills   furosemide (LASIX) 20 MG tablet 30 tablet 3    Sig: TAKE 1 TABLET BY MOUTH ONCE DAILY FOR  ONE  WEEK  THEN  AS  NEEDED  FOR  SWELLING     Cardiovascular:  Diuretics - Loop Failed - 01/15/2021  2:50 PM      Failed - K in normal range and within 360 days    Potassium  Date Value Ref Range Status  12/15/2019 3.8 3.5 - 5.2 mmol/L Final          Failed -  Ca in normal range and within 360 days    Calcium  Date Value Ref Range Status  12/15/2019 8.3 (L) 8.7 - 10.2 mg/dL Final          Failed - Na in normal range and within 360 days    Sodium  Date Value Ref Range Status  12/15/2019 142 134 - 144 mmol/L Final          Failed - Cr in normal range and within 360 days    Creat  Date Value Ref Range Status  01/14/2016 1.09 0.70 - 1.33 mg/dL Final    Comment:      For patients > or = 61 years of age: The upper reference limit for Creatinine is approximately 13% higher for people identified as African-American.      Creatinine, Ser  Date Value Ref Range Status  12/15/2019 1.07 0.76 - 1.27 mg/dL Final   Creatinine, Urine  Date Value Ref Range Status  04/02/2016 164 20 - 370 mg/dL Final          Passed - Last BP in normal range    BP Readings from Last 1 Encounters:  08/20/20 132/86          Passed - Valid encounter within last 6 months    Recent Outpatient Visits           1 month ago Viral upper respiratory tract infection   [place], [place] W, NP   1 month ago Hyperlipidemia, unspecified hyperlipidemia type   [place], [place], [place]   10 months ago Type 2 diabetes mellitus without complication, without  long-term current use of insulin [facility])   [place], [name], [place]   1 year ago Hospital discharge follow-up   [place] Ladell Pier, MD   1 year ago Sinusitis, unspecified chronicity, unspecified location   [place], Charlane Ferretti, MD               gabapentin (NEURONTIN) 300 MG capsule 90 capsule 3    Sig: Take 1 capsule (300 mg total) by mouth 3 (three) times daily.     Neurology: Anticonvulsants - gabapentin Passed - 01/15/2021  2:50 PM      Passed - Valid encounter within last 12 months    Recent Outpatient Visits           1 month ago Viral upper respiratory tract infection   [place], [place] W, NP   1 month ago Hyperlipidemia, unspecified hyperlipidemia type   [place], Levada Dy M, [place]   10 months ago Type 2 diabetes mellitus without complication, without long-term current use of insulin [facility])   [name], [name], [place]   1 year ago Hospital discharge follow-up   [place] Ladell Pier, MD   1 year ago Sinusitis, unspecified chronicity, unspecified location   [place], Charlane Ferretti, MD               ipratropium-albuterol (DUONEB) 0.5-2.5 (3) MG/3ML SOLN 15 mL 1    Sig: Take 3 mLs by nebulization every 4 (four) hours as needed (for breathing).     Pulmonology:  Combination Products Passed - 01/15/2021  2:50 PM      Passed -  Valid encounter within last 12 months    Recent Outpatient Visits           1 month ago Viral upper respiratory tract infection   [place], [place] W, NP   1 month ago Hyperlipidemia, unspecified hyperlipidemia type   [place], [place], [place]   10 months ago Type  2 diabetes mellitus without complication, without long-term current use of insulin [facility])   [place], [name], [place]   1 year ago Hospital discharge follow-up   [place] Ladell Pier, MD   1 year ago Sinusitis, unspecified chronicity, unspecified location   [place], Charlane Ferretti, MD               pravastatin (PRAVACHOL) 20 MG tablet 30 tablet 3    Sig: Take 1 tablet (20 mg total) by mouth at bedtime.     Cardiovascular:  Antilipid - Statins Failed - 01/15/2021  2:50 PM      Failed - Total Cholesterol in normal range and within 360 days    Cholesterol, Total  Date Value Ref Range Status  09/14/2018 167 100 - 199 mg/dL Final          Failed - LDL in normal range and within 360 days    LDL Calculated  Date Value Ref Range Status  09/14/2018 95 0 - 99 mg/dL Final          Failed - HDL in normal range and within 360 days    HDL  Date Value Ref Range Status  09/14/2018 63 >39 mg/dL Final          Failed - Triglycerides in normal range and within 360 days    Triglycerides  Date Value Ref Range Status  09/14/2018 43 0 - 149 mg/dL Final          Passed - Patient is not pregnant      Passed - Valid encounter within last 12 months    Recent Outpatient Visits           1 month ago Viral upper respiratory tract infection   [place], [place] W, NP   1 month ago Hyperlipidemia, unspecified hyperlipidemia type   [place], [place]   10 months ago Type 2 diabetes mellitus without complication, without long-term current use of insulin [facility])   [place], [name], [place]   1 year ago Hospital discharge follow-up   [place] Ladell Pier, MD   1 year ago Sinusitis, unspecified chronicity, unspecified  location   [place], [place], MD

## 2021-01-16 MED ORDER — METFORMIN HCL 500 MG PO TABS: 500.0000 mg | ORAL_TABLET | Freq: Every morning | ORAL | 0 refills | Status: DC

## 2021-01-16 MED ORDER — HYDROCHLOROTHIAZIDE 12.5 MG PO TABS: 12.5000 mg | ORAL_TABLET | Freq: Every day | ORAL | 0 refills | Status: DC

## 2021-01-16 MED ORDER — LISINOPRIL-HYDROCHLOROTHIAZIDE 10-12.5 MG PO TABS: 1.0000 | ORAL_TABLET | Freq: Every day | ORAL | 0 refills | Status: DC

## 2021-01-16 MED ORDER — MONTELUKAST SODIUM 10 MG PO TABS: 10.0000 mg | ORAL_TABLET | Freq: Every day | ORAL | 0 refills | Status: DC

## 2021-01-16 NOTE — Telephone Encounter (Signed)
Form was located and placed in pcp folder for signature on 9/20

## 2021-01-16 NOTE — Telephone Encounter (Signed)
Please contact pt and schedule an appt for in person

## 2021-01-17 NOTE — Telephone Encounter (Signed)
Pt scheduled appt for 10.4.22/ he also asked to speak with Jay'a about a letter he dropped off on Monday and stated he was suppose to get it back by today/ he asked if the nurse or DR. Wynetta Emery can complete the paperwork about 2 medications so he can pick it up today / please advise

## 2021-01-18 NOTE — Telephone Encounter (Signed)
Returned pt call and left a detailed vm that we have form but he will need to keep his appt for 10/4 to get it signed and if he has any questions or concerns to give [place] a call

## 2021-01-19 ENCOUNTER — Ambulatory Visit (HOSPITAL_BASED_OUTPATIENT_CLINIC_OR_DEPARTMENT_OTHER): Payer: Medicare Other

## 2021-01-19 DIAGNOSIS — Z0000 Encounter for general adult medical examination without abnormal findings: Secondary | ICD-10-CM

## 2021-01-19 NOTE — Patient Instructions (Signed)
Health Maintenance, Male Adopting a healthy lifestyle and getting preventive care are important in promoting health and wellness. Ask your health care provider about: The right schedule for you to have regular tests and exams. Things you can do on your own to prevent diseases and keep yourself healthy. What should I know about diet, weight, and exercise? Eat a healthy diet  Eat a diet that includes plenty of vegetables, fruits, low-fat dairy products, and lean protein. Do not eat a lot of foods that are high in solid fats, added sugars, or sodium. Maintain a healthy weight Body mass index (BMI) is a measurement that can be used to identify possible weight problems. It estimates body fat based on height and weight. Your health care provider can help determine your BMI and help you achieve or maintain a healthy weight. Get regular exercise Get regular exercise. This is one of the most important things you can do for your health. Most adults should: Exercise for at least 150 minutes each week. The exercise should increase your heart rate and make you sweat (moderate-intensity exercise). Do strengthening exercises at least twice a week. This is in addition to the moderate-intensity exercise. Spend less time sitting. Even light physical activity can be beneficial. Watch cholesterol and blood lipids Have your blood tested for lipids and cholesterol at 61 years of age, then have this test every 5 years. You may need to have your cholesterol levels checked more often if: Your lipid or cholesterol levels are high. You are older than 61 years of age. You are at high risk for heart disease. What should I know about cancer screening? Many types of cancers can be detected early and may often be prevented. Depending on your health history and family history, you may need to have cancer screening at various ages. This may include screening for: Colorectal cancer. Prostate cancer. Skin cancer. Lung  cancer. What should I know about heart disease, diabetes, and high blood pressure? Blood pressure and heart disease High blood pressure causes heart disease and increases the risk of stroke. This is more likely to develop in people who have high blood pressure readings, are of African descent, or are overweight. Talk with your health care provider about your target blood pressure readings. Have your blood pressure checked: Every 3-5 years if you are 18-39 years of age. Every year if you are 40 years old or older. If you are between the ages of 65 and 75 and are a current or former smoker, ask your health care provider if you should have a one-time screening for abdominal aortic aneurysm (AAA). Diabetes Have regular diabetes screenings. This checks your fasting blood sugar level. Have the screening done: Once every three years after age 45 if you are at a normal weight and have a low risk for diabetes. More often and at a younger age if you are overweight or have a high risk for diabetes. What should I know about preventing infection? Hepatitis B If you have a higher risk for hepatitis B, you should be screened for this virus. Talk with your health care provider to find out if you are at risk for hepatitis B infection. Hepatitis C Blood testing is recommended for: Everyone born from 1945 through 1965. Anyone with known risk factors for hepatitis C. Sexually transmitted infections (STIs) You should be screened each year for STIs, including gonorrhea and chlamydia, if: You are sexually active and are younger than 61 years of age. You are older than 61 years   of age and your health care provider tells you that you are at risk for this type of infection. Your sexual activity has changed since you were last screened, and you are at increased risk for chlamydia or gonorrhea. Ask your health care provider if you are at risk. Ask your health care provider about whether you are at high risk for HIV.  Your health care provider may recommend a prescription medicine to help prevent HIV infection. If you choose to take medicine to prevent HIV, you should first get tested for HIV. You should then be tested every 3 months for as long as you are taking the medicine. Follow these instructions at home: Lifestyle Do not use any products that contain nicotine or tobacco, such as cigarettes, e-cigarettes, and chewing tobacco. If you need help quitting, ask your health care provider. Do not use street drugs. Do not share needles. Ask your health care provider for help if you need support or information about quitting drugs. Alcohol use Do not drink alcohol if your health care provider tells you not to drink. If you drink alcohol: Limit how much you have to 0-2 drinks a day. Be aware of how much alcohol is in your drink. In the [place] one drink equals one 12 oz bottle of beer (355 mL), one 5 oz glass of wine (148 mL), or one 1 oz glass of hard liquor (44 mL). General instructions Schedule regular health, dental, and eye exams. Stay current with your vaccines. Tell your health care provider if: You often feel depressed. You have ever been abused or do not feel safe at home. Summary Adopting a healthy lifestyle and getting preventive care are important in promoting health and wellness. Follow your health care provider's instructions about healthy diet, exercising, and getting tested or screened for diseases. Follow your health care provider's instructions on monitoring your cholesterol and blood pressure. This information is not intended to replace advice given to you by your health care provider. Make sure you discuss any questions you have with your health care provider. Document Revised: 06/22/2020 Document Reviewed: 04/07/2018 Elsevier Patient Education  2022 Elsevier Inc.  

## 2021-01-19 NOTE — Progress Notes (Signed)
Subjective:   Christian [name] is a 60 y.o. male who presents for Medicare Annual/Subsequent preventive examination.I connected with  Christian [name] on 01/19/21 by a video enabled telemedicine application and verified that I am speaking with the correct person using two identifiers.   I discussed the limitations of evaluation and management by telemedicine. The patient expressed understanding and agreed to proceed.   Location of patient: Home Location of provider: Office  Persons participating in visit: Christian [name] (patient) and Drenda Freeze, [name]  Review of Systems    Defer to PCP       Objective:    Today's Vitals   01/19/21 1057  PainSc: 8    There is no height or weight on file to calculate BMI.  Advanced Directives 01/19/2021 12/01/2019 10/11/2019 04/06/2018 08/06/2017 08/04/2017 05/10/2017  Does Patient Have a Medical Advance Directive? _0  No No  Would patient like information on creating a medical advance directive? - No - Patient declined Yes (Inpatient - patient defers creating a medical advance directive at this time - Information given) No - Patient declined No - Patient declined No - Patient declined No - Patient declined    Current Medications (verified) Outpatient Encounter Medications as of 01/19/2021  Medication Sig   albuterol (VENTOLIN HFA) 108 (90 Base) MCG/ACT inhaler 1-2 puffs as needed for wheezing   benzonatate (TESSALON) 100 MG capsule Take 2 capsules (200 mg total) by mouth 3 (three) times daily as needed.   Blood Glucose Monitoring Suppl (ONE TOUCH ULTRA 2) w/Device KIT Use as directed 3 times daily E11.9   Budeson-Glycopyrrol-Formoterol (BREZTRI AEROSPHERE) 160-9-4.8 MCG/ACT AERO Inhale 2 puffs into the lungs 2 (two) times daily.   cetirizine (ZYRTEC) 10 MG tablet Take 1 tablet (10 mg total) by mouth daily.   dextromethorphan-guaiFENesin (TUSSIN DM) 10-100 MG/5ML liquid Take 10 mLs by mouth every 4 (four) hours as needed for cough.    fluticasone (FLONASE) 50 MCG/ACT nasal spray Place 2 sprays into both nostrils daily.   furosemide (LASIX) 20 MG tablet TAKE 1 TABLET BY MOUTH ONCE DAILY FOR  ONE  WEEK  THEN  AS  NEEDED  FOR  SWELLING   gabapentin (NEURONTIN) 300 MG capsule Take 1 capsule (300 mg total) by mouth 3 (three) times daily.   glucose blood (ACCU-CHEK GUIDE) test strip USE 1  TEST STRIP ONCE DAILY TO CHECK BLOOD SUGAR   hydrochlorothiazide (HYDRODIURIL) 12.5 MG tablet Take 1 tablet (12.5 mg total) by mouth daily.   ipratropium-albuterol (DUONEB) 0.5-2.5 (3) MG/3ML SOLN Take 3 mLs by nebulization every 4 (four) hours as needed (for breathing).   lisinopril-hydrochlorothiazide (ZESTORETIC) 10-12.5 MG tablet Take 1 tablet by mouth daily.   mepolizumab (NUCALA) 100 MG/ML SOSY Inject 100 mg into the skin every 28 (twenty-eight) days.   metFORMIN (GLUCOPHAGE) 500 MG tablet Take 1 tablet (500 mg total) by mouth every morning.   montelukast (SINGULAIR) 10 MG tablet Take 1 tablet (10 mg total) by mouth at bedtime.   pravastatin (PRAVACHOL) 20 MG tablet Take 1 tablet (20 mg total) by mouth at bedtime.   TRUEPLUS LANCETS 28G MISC Check glucose 2 times a day   No facility-administered encounter medications on file as of 01/19/2021.    Allergies (verified) Phenergan [promethazine hcl]   History: Past Medical History:  Diagnosis Date   Allergy    Arthritis    "left knee" (01/22/2016)   Asthma    Chronic lower back pain    COPD (chronic obstructive  pulmonary disease) ([place])    Dyspnea    with extertion   High cholesterol    Hypertension    Neuropathy    OSA on CPAP    Secondhand smoke exposure    Sleep apnea    wears CPAP, not wearing now  for months   Type II diabetes mellitus ([name]) dx'd 01/17/2016   Past Surgical History:  Procedure Laterality Date   ANTERIOR CERVICAL DECOMP/DISCECTOMY FUSION  ~ 1995   "titanium"   APPENDECTOMY  ~ [name]     x 2  L 4disectomy   COLONOSCOPY W/ POLYPECTOMY      INGUINAL HERNIA REPAIR Right ~ [name]  ~ 2000; ~2006   NASAL SINUS SURGERY  ~ 2007   TOTAL KNEE ARTHROPLASTY Left 08/06/2017   Procedure: LEFT TOTAL KNEE ARTHROPLASTY;  Surgeon: Leandrew Koyanagi, MD;  Location: [name];  Service: Orthopedics;  Laterality: Left;   Family History  Problem Relation Age of Onset   Hypertension Mother    Hypertension Father    Heart Problems Brother        Heart transplant, pt does not know cause    Rectal cancer Brother    Colon cancer Neg Hx    Esophageal cancer Neg Hx    Stomach cancer Neg Hx    Social History   Socioeconomic History   Marital status: Married    Spouse name: Not on file   Number of children: Not on file   Years of education: Not on file   Highest education level: Not on file  Occupational History   Not on file  Tobacco Use   Smoking status: Passive Smoke Exposure - Never Smoker   Smokeless tobacco: Never   Tobacco comments:    passive cig smoking at work  1994- 2012  Vaping Use   Vaping Use: Never used  Substance and Sexual Activity   Alcohol use: No   Drug use: No   Sexual activity: Yes  Other Topics Concern   Not on file  Social History Narrative   Not on file   Social Determinants of Health   Financial Resource Strain: High Risk   Difficulty of Paying Living Expenses: Hard  Food Insecurity: No Food Insecurity   Worried About Running Out of Food in the Last Year: Never true   [place] in the Last Year: Never true  Transportation Needs: No Transportation Needs   Lack of Transportation (Medical): No   Lack of Transportation (Non-Medical): No  Physical Activity: Inactive   Days of Exercise per Week: 0 days   Minutes of Exercise per Session: 0 min  Stress: No Stress Concern Present   Feeling of Stress : Not at all  Social Connections: Moderately Isolated   Frequency of Communication with Friends and Family: Three times a week   Frequency of Social Gatherings with Friends and Family: Once a  week   Attends Religious Services: Never   Marine scientist or Organizations: No   Attends Music therapist: Never   Marital Status: Married    Tobacco Counseling Counseling given: Not Answered Tobacco comments: passive cig smoking at work  1994- 2012   Clinical Intake:  Pre-visit preparation completed: Yes  Pain : 0-10 Pain Score: 8  Pain Location: Back Pain Onset: More than a month ago Pain Frequency: Constant     Diabetes: Yes CBG done?: No Did pt. bring in CBG monitor from home?:  No  How often do you need to have someone help you when you read instructions, pamphlets, or other written materials from your doctor or pharmacy?: 1 - Never What is the last grade level you completed in school?: 12th grade  Diabetic?Yes  Interpreter Needed?: No  Information entered by :: Drenda Freeze, [place]   Activities of Daily Living In your present state of health, do you have any difficulty performing the following activities: 01/19/2021  Hearing? N  Vision? N  Difficulty concentrating or making decisions? N  Walking or climbing stairs? N  Dressing or bathing? N  Doing errands, shopping? N  Preparing Food and eating ? N  Using the Toilet? N  In the past six months, have you accidently leaked urine? N  Do you have problems with loss of bowel control? N  Managing your Medications? N  Managing your Finances? N  Housekeeping or managing your Housekeeping? N  Some recent data might be hidden    Patient Care Team: Ladell Pier, MD as PCP - General (Internal Medicine)  Indicate any recent Medical Services you may have received from other than [facility] providers in the past year (date may be approximate).     Assessment:   This is a routine wellness examination for [place].  Hearing/Vision screen No results found.  Dietary issues and exercise activities discussed:     Goals Addressed   None    Depression Screen PHQ 2/9 Scores 01/19/2021 10/24/2020  12/15/2019 10/11/2019 06/20/2019 05/05/2019 07/26/2018  PHQ - 2 Score 0 0 0 0 0 0 0  PHQ- 9 Score - - - - - - -    Fall Risk Fall Risk  01/19/2021 12/15/2019 11/16/2019 10/11/2019 03/02/2019  Falls in the past year? 0 0 0 0 0  Number falls in past yr: 0 0 - 0 -  Injury with Fall? 0 0 - 0 -  Risk for fall due to : No Fall Risks - No Fall Risks - -  Follow up Falls evaluation completed - - Falls evaluation completed;Education provided;Falls prevention discussed -    FALL RISK PREVENTION PERTAINING TO THE HOME:  Any stairs in or around the home? Yes  If so, are there any without handrails? No  Home free of loose throw rugs in walkways, pet beds, electrical cords, etc? Yes  Adequate lighting in your home to reduce risk of falls? Yes   ASSISTIVE DEVICES UTILIZED TO PREVENT FALLS:  Life alert? No  Use of a cane, walker or w/c? No  Grab bars in the bathroom? Yes  Shower chair or bench in shower? Yes  Elevated toilet seat or a handicapped toilet? Yes   TIMED UP AND GO:  Was the test performed?  N/A .  Length of time to ambulate 10 feet: N/A sec.     Cognitive Function: MMSE - Mini Mental State Exam 10/11/2019  Orientation to time 5  Orientation to Place 5  Registration 3  Attention/ Calculation 5  Recall 3  Language- name 2 objects 2  Language- repeat 1  Language- follow 3 step command 3  Language- read & follow direction 1  Write a sentence 1  Copy design 1  Total score 30     6CIT Screen 01/19/2021  What Year? 0 points  What month? 0 points  What time? 0 points  Count back from 20 0 points  Months in reverse 4 points  Repeat phrase 10 points  Total Score 14    Immunizations Immunization History  Administered Date(s) Administered   Influenza Inj Mdck Quad Pf 05/29/2018   Influenza Split 02/23/2016   Influenza,inj,Quad PF,6+ Mos 12/22/2016, 02/10/2019   Influenza-Unspecified 02/07/2019   PFIZER(Purple Top)SARS-COV-2 Vaccination 07/14/2019, 08/10/2019   Pneumococcal  Conjugate-13 03/25/2016   Pneumococcal Polysaccharide-23 07/16/2016   Tdap 04/02/2016    TDAP status: Up to date  Flu Vaccine status: Due, Education has been provided regarding the importance of this vaccine. Advised may receive this vaccine at local pharmacy or Health Dept. Aware to provide a copy of the vaccination record if obtained from local pharmacy or Health Dept. Verbalized acceptance and understanding.  Covid-19 vaccine status: Completed vaccines  Qualifies for Shingles Vaccine? Yes   Zostavax completed No   Shingrix Completed?: No.    Education has been provided regarding the importance of this vaccine. Patient has been advised to call insurance company to determine out of pocket expense if they have not yet received this vaccine. Advised may also receive vaccine at local pharmacy or Health Dept. Verbalized acceptance and understanding.  Screening Tests Health Maintenance  Topic Date Due   Zoster Vaccines- Shingrix (1 of 2) Never done   FOOT EXAM  04/02/2017   COVID-19 Vaccine (3 - Pfizer risk series) 09/07/2019   HEMOGLOBIN A1C  06/03/2020   INFLUENZA VACCINE  11/26/2020   OPHTHALMOLOGY EXAM  12/26/2020   COLONOSCOPY (Pts 45-53yr Insurance coverage will need to be confirmed)  07/29/2022   TETANUS/TDAP  04/02/2026   Hepatitis C Screening  Completed   HIV Screening  Completed   HPV VACCINES  Aged Out    Health Maintenance  Health Maintenance Due  Topic Date Due   Zoster Vaccines- Shingrix (1 of 2) Never done   FOOT EXAM  04/02/2017   COVID-19 Vaccine (3 - Pfizer risk series) 09/07/2019   HEMOGLOBIN A1C  06/03/2020   INFLUENZA VACCINE  11/26/2020   OPHTHALMOLOGY EXAM  12/26/2020    Colorectal cancer screening: Type of screening: Colonoscopy. Completed 07/28/2017. Repeat every 5 years  Lung Cancer Screening: (Low Dose CT Chest recommended if Age 61-80years, 30 pack-year currently smoking OR have quit w/in 15years.) does not qualify.   Lung Cancer Screening  Referral: No  Additional Screening:  Hepatitis C Screening: does qualify; Completed 04/02/2016  Vision Screening: Recommended annual ophthalmology exams for early detection of glaucoma and other disorders of the eye. Is the patient up to date with their annual eye exam?  Yes  Who is the provider or what is the name of the office in which the patient attends annual eye exams? N/A If pt is not established with a provider, would they like to be referred to a provider to establish care? No .   Dental Screening: Recommended annual dental exams for proper oral hygiene  Community Resource Referral / Chronic Care Management: CRR required this visit?  No   CCM required this visit?  No      Plan:     I have personally reviewed and noted the following in the patient's chart:   Medical and social history Use of alcohol, tobacco or illicit drugs  Current medications and supplements including opioid prescriptions. Patient is currently taking opioid prescriptions. Information provided to patient regarding non-opioid alternatives. Patient advised to discuss non-opioid treatment plan with their provider. Functional ability and status Nutritional status Physical activity Advanced directives List of other physicians Hospitalizations, surgeries, and ER visits in previous 12 months Vitals Screenings to include cognitive, depression, and falls Referrals and appointments  In addition, I have reviewed  and discussed with patient certain preventive protocols, quality metrics, and best practice recommendations. A written personalized care plan for preventive services as well as general preventive health recommendations were provided to patient.     Drenda Freeze, [facility]   01/19/2021   Nurse Notes: Non-Face to Face 60 minute visit Encounter.  Mr. Hoard , Thank you for taking time to come for your Medicare Wellness Visit. I appreciate your ongoing commitment to your health goals. Please review the  following plan we discussed and let me know if I can assist you in the future.   These are the goals we discussed:  Goals   None     This is a list of the screening recommended for you and due dates:  Health Maintenance  Topic Date Due   Zoster (Shingles) Vaccine (1 of 2) Never done   Complete foot exam   04/02/2017   COVID-19 Vaccine (3 - Pfizer risk series) 09/07/2019   Hemoglobin A1C  06/03/2020   Flu Shot  11/26/2020   Eye exam for diabetics  12/26/2020   Colon Cancer Screening  07/29/2022   Tetanus Vaccine  04/02/2026   Hepatitis C Screening: USPSTF Recommendation to screen - Ages 18-79 yo.  Completed   HIV Screening  Completed   HPV Vaccine  Aged Out

## 2021-01-21 ENCOUNTER — Other Ambulatory Visit: Payer: Self-pay | Admitting: Internal Medicine

## 2021-01-21 DIAGNOSIS — E119 Type 2 diabetes mellitus without complications: Secondary | ICD-10-CM

## 2021-01-21 DIAGNOSIS — I10 Essential (primary) hypertension: Secondary | ICD-10-CM

## 2021-01-21 NOTE — Telephone Encounter (Signed)
Requested medication (s) are due for refill today: Yes  Requested medication (s) are on the active medication list: Yes  Last refill:  01/16/21  Future visit scheduled: Yes  Notes to clinic:  Patient requesting 90 day supply, already refilled x 30 days by provider, routing for approval     Requested Prescriptions  Pending Prescriptions Disp Refills   lisinopril-hydrochlorothiazide (ZESTORETIC) 10-12.5 MG tablet 30 tablet 0    Sig: Take 1 tablet by mouth daily.     Cardiovascular:  ACEI + Diuretic Combos Failed - 01/21/2021  3:33 PM      Failed - Na in normal range and within 180 days    Sodium  Date Value Ref Range Status  12/15/2019 142 134 - 144 mmol/L Final          Failed - K in normal range and within 180 days    Potassium  Date Value Ref Range Status  12/15/2019 3.8 3.5 - 5.2 mmol/L Final          Failed - Cr in normal range and within 180 days    Creat  Date Value Ref Range Status  01/14/2016 1.09 0.70 - 1.33 mg/dL Final    Comment:      For patients > or = 61 years of age: The upper reference limit for Creatinine is approximately 13% higher for people identified as African-American.      Creatinine, Ser  Date Value Ref Range Status  12/15/2019 1.07 0.76 - 1.27 mg/dL Final   Creatinine, Urine  Date Value Ref Range Status  04/02/2016 164 20 - 370 mg/dL Final          Failed - Ca in normal range and within 180 days    Calcium  Date Value Ref Range Status  12/15/2019 8.3 (L) 8.7 - 10.2 mg/dL Final          Passed - Patient is not pregnant      Passed - Last BP in normal range    BP Readings from Last 1 Encounters:  08/20/20 132/86          Passed - Valid encounter within last 6 months    Recent Outpatient Visits           1 month ago Viral upper respiratory tract infection   [place], [place] W, NP   2 months ago Hyperlipidemia, unspecified hyperlipidemia type   [place], [place], [place]   10 months ago Type 2 diabetes mellitus without complication, without long-term current use of insulin [facility])   [place], [name], [place]   1 year ago Hospital discharge follow-up   [employer] Ladell Pier, MD   1 year ago Sinusitis, unspecified chronicity, unspecified location   [place], Charlane Ferretti, MD       Future Appointments             In 1 week Ladell Pier, MD [place]             metFORMIN (GLUCOPHAGE) 500 MG tablet 30 tablet 0    Sig: Take 1 tablet (500 mg total) by mouth every morning.     Endocrinology:  Diabetes - Biguanides Failed - 01/21/2021  3:33 PM      Failed - Cr in normal range and within 360 days    Creat  Date Value Ref Range Status  01/14/2016 1.09 0.70 - 1.33 mg/dL Final    Comment:      For patients > or = 61 years of age: The upper reference limit for Creatinine is approximately 13% higher for people identified as African-American.      Creatinine, Ser  Date Value Ref Range Status  12/15/2019 1.07 0.76 - 1.27 mg/dL Final   Creatinine, Urine  Date Value Ref Range Status  04/02/2016 164 20 - 370 mg/dL Final          Failed - HBA1C is between 0 and 7.9 and within 180 days    HbA1c, POC (prediabetic range)  Date Value Ref Range Status  06/20/2019 6.3 5.7 - 6.4 % Final   Hgb A1c MFr Bld  Date Value Ref Range Status  12/02/2019 6.3 (H) 4.8 - 5.6 % Final    Comment:    (NOTE) Pre diabetes:          5.7%-6.4%  Diabetes:              >6.4%  Glycemic control for   <7.0% adults with diabetes           Failed - AA eGFR in normal range and within 360 days    GFR, Est African American  Date Value Ref Range Status  01/14/2016 87 >=60 mL/min Final   GFR calc Af Amer  Date Value Ref Range Status  12/15/2019 87 >59 mL/min/1.73 Final    Comment:     **[facility] currently reports eGFR in compliance with the current**   recommendations of the [employer]. [facility] will   update reporting as new guidelines are published from the NKF-ASN   Task force.    GFR, Est Non African American  Date Value Ref Range Status  01/14/2016 75 >=60 mL/min Final   GFR calc non Af Amer  Date Value Ref Range Status  12/15/2019 76 >59 mL/min/1.73 Final          Passed - Valid encounter within last 6 months    Recent Outpatient Visits           1 month ago Viral upper respiratory tract infection   [place], [place] W, NP   2 months ago Hyperlipidemia, unspecified hyperlipidemia type   [place], Levada Dy M, [place]   10 months ago Type 2 diabetes mellitus without complication, without long-term current use of insulin [facility])   [place], [name], [place]   1 year ago Hospital discharge follow-up   [place], MD   1 year ago Sinusitis, unspecified chronicity, unspecified location   [place], [place], MD       Future Appointments             In 1 week Ladell Pier, MD [place]

## 2021-01-21 NOTE — Telephone Encounter (Signed)
Copied from [place] [phone] [phone]. Topic: Quick Communication - Rx Refill/Question >> Jan 21, 2021  9:32 AM Tessa Lerner A wrote: Medication: metFORMIN (GLUCOPHAGE) 500 MG tablet [[id number]]   lisinopril-hydrochlorothiazide (ZESTORETIC) 10-12.5 MG tablet [[id number]]   Patient is requesting 90 day supplies of both medications   Has the patient contacted their pharmacy? Yes.   (Agent: If no, request that the patient contact the pharmacy for the refill.) (Agent: If yes, when and what did the pharmacy advise?)  Preferred Pharmacy (with phone number or street name): [facility] [place], [place]  Phone:  [phone] Fax:  [phone]  Has the patient been seen for an appointment in the last year OR does the patient have an upcoming appointment? No.  Agent: Please be advised that RX refills may take up to 3 business days. We ask that you follow-up with your pharmacy.

## 2021-01-22 MED ORDER — LISINOPRIL-HYDROCHLOROTHIAZIDE 10-12.5 MG PO TABS
1.0000 | ORAL_TABLET | Freq: Every day | ORAL | 0 refills | Status: DC
Start: 2021-01-22 — End: 2021-02-06

## 2021-01-22 MED ORDER — METFORMIN HCL 500 MG PO TABS
500.0000 mg | ORAL_TABLET | Freq: Every morning | ORAL | 0 refills | Status: DC
Start: 2021-01-22 — End: 2021-02-01

## 2021-01-29 ENCOUNTER — Ambulatory Visit: Payer: Medicare Other | Attending: Internal Medicine | Admitting: Internal Medicine

## 2021-01-29 ENCOUNTER — Other Ambulatory Visit: Payer: Self-pay

## 2021-01-29 VITALS — BP 170/93 | HR 65 | Ht 70.0 in | Wt 191.2 lb

## 2021-01-29 DIAGNOSIS — E1169 Type 2 diabetes mellitus with other specified complication: Secondary | ICD-10-CM

## 2021-01-29 DIAGNOSIS — G8929 Other chronic pain: Secondary | ICD-10-CM

## 2021-01-29 DIAGNOSIS — J8283 Eosinophilic asthma: Secondary | ICD-10-CM | POA: Diagnosis not present

## 2021-01-29 DIAGNOSIS — E663 Overweight: Secondary | ICD-10-CM

## 2021-01-29 DIAGNOSIS — E119 Type 2 diabetes mellitus without complications: Secondary | ICD-10-CM

## 2021-01-29 DIAGNOSIS — Z23 Encounter for immunization: Secondary | ICD-10-CM

## 2021-01-29 DIAGNOSIS — Z125 Encounter for screening for malignant neoplasm of prostate: Secondary | ICD-10-CM

## 2021-01-29 DIAGNOSIS — G4733 Obstructive sleep apnea (adult) (pediatric): Secondary | ICD-10-CM

## 2021-01-29 DIAGNOSIS — E785 Hyperlipidemia, unspecified: Secondary | ICD-10-CM | POA: Diagnosis not present

## 2021-01-29 DIAGNOSIS — E1159 Type 2 diabetes mellitus with other circulatory complications: Secondary | ICD-10-CM

## 2021-01-29 DIAGNOSIS — Z9989 Dependence on other enabling machines and devices: Secondary | ICD-10-CM

## 2021-01-29 DIAGNOSIS — I152 Hypertension secondary to endocrine disorders: Secondary | ICD-10-CM | POA: Diagnosis not present

## 2021-01-29 DIAGNOSIS — M545 Low back pain, unspecified: Secondary | ICD-10-CM

## 2021-01-29 LAB — POCT GLYCOSYLATED HEMOGLOBIN (HGB A1C): Hemoglobin A1C: 6 % — AB (ref 4.0–5.6)

## 2021-01-29 LAB — GLUCOSE, POCT (MANUAL RESULT ENTRY): POC Glucose: 95 mg/dl (ref 70–99)

## 2021-01-29 NOTE — Progress Notes (Signed)
Patient ID: Christian [name], male    DOB: 11-19-1960  MRN: [id number]  CC: Diabetes and Medication Refill   Subjective: Christian [name] is a 60 y.o. male who presents for chronic ds management His concerns today include:  History of HTN, DM type II, HL, moderate persistent asthma, vit D def, OSA on CPAP.  HTN:  BP elev.  Did not take meds as yet for this a.m. he is on lisinopril 10 mg/HCTZ 12.5 mg and HCTZ 12.5 mg daily. Checks BP QOD.  Gives range 110-113/60-70s Limits salt in foods. No CP, LE edema, dizziness  DM/Over wgh:  Lab Results  Component Value Date   HGBA1C 6.0 (A) 01/29/2021    checks BS Q a.m.  Range 100-115 Compliant with Metformin Decrease appetite at times. Prefers can fruits. Reports compliance with Pravachol Stays active working in his yard and tries to park away from buildings so he can walk more.  Asthma/OSA: not using CPAP consistently.  When he has an URI, he finds it difficult to wear the mask.  Plans to start using it again starting tonight.  Wakes up feeling refresh in the mornings. Feels his asthma I stable on current drug regiment.  On Breztri BID, Nucala Q mth.  Uses his neb 2x/day.  Breathing worse in hot weather  Wants referral to pain specialist for chronic LBP.  Had 2 surgeries on lower back for disc issue.  Both procedure done in [place], last one was 7-8 yrs ago.  .Rates pain 6-9/10.  Pain in the midline of lumbar spine.  No radiation, no numbness or tingling.  Takes Tylenol 325 and sometimes the 500 mg TID.  Does not help.  Pain worse when moving around and laying down.    Has been donating plasma 2 x wk and wants to continue doing so.  States the monetary compensation helps him to pay his bills.  Has form from plasma facility that he would like for me to sign.  HM: agrees to flu and shingles vaccine.  Had 3 COVID vaccines so far.   Patient Active Problem List   Diagnosis Date Noted   Severe persistent asthma with acute exacerbation  12/01/2019   Medication management 07/21/2019   Nasal polyps 06/21/2019   Allergic rhinitis 04/05/2019   OSA (obstructive sleep apnea) 04/05/2019   Presence of left artificial knee joint 67/34/1937   Eosinophilic asthma 90/24/0973   Primary osteoarthritis of left knee 02/23/2017   Chronic pain syndrome 04/02/2016   Intrinsic asthma 03/25/2016   Essential hypertension 01/23/2016   Hyperlipidemia 01/23/2016   Secondhand smoke exposure 01/23/2016   Diabetes mellitus with neuropathy ([place]) 01/23/2016     Current Outpatient Medications on File Prior to Visit  Medication Sig Dispense Refill   albuterol (VENTOLIN HFA) 108 (90 Base) MCG/ACT inhaler 1-2 puffs as needed for wheezing 18 g 1   benzonatate (TESSALON) 100 MG capsule Take 2 capsules (200 mg total) by mouth 3 (three) times daily as needed. 40 capsule 0   Blood Glucose Monitoring Suppl (ONE TOUCH ULTRA 2) w/Device KIT Use as directed 3 times daily E11.9 1 each 0   Budeson-Glycopyrrol-Formoterol (BREZTRI AEROSPHERE) 160-9-4.8 MCG/ACT AERO Inhale 2 puffs into the lungs 2 (two) times daily. 10.7 g 3   cetirizine (ZYRTEC) 10 MG tablet Take 1 tablet (10 mg total) by mouth daily. 90 tablet 2   fluticasone (FLONASE) 50 MCG/ACT nasal spray Place 2 sprays into both nostrils daily. 16 g 6   furosemide (LASIX) 20 MG tablet  TAKE 1 TABLET BY MOUTH ONCE DAILY FOR  ONE  WEEK  THEN  AS  NEEDED  FOR  SWELLING 30 tablet 3   gabapentin (NEURONTIN) 300 MG capsule Take 1 capsule (300 mg total) by mouth 3 (three) times daily. 90 capsule 3   glucose blood (ACCU-CHEK GUIDE) test strip USE 1  TEST STRIP ONCE DAILY TO CHECK BLOOD SUGAR 100 each 1   hydrochlorothiazide (HYDRODIURIL) 12.5 MG tablet Take 1 tablet (12.5 mg total) by mouth daily. 30 tablet 0   ipratropium-albuterol (DUONEB) 0.5-2.5 (3) MG/3ML SOLN Take 3 mLs by nebulization every 4 (four) hours as needed (for breathing). 15 mL 1   lisinopril-hydrochlorothiazide (ZESTORETIC) 10-12.5 MG tablet Take 1  tablet by mouth daily. 30 tablet 0   mepolizumab (NUCALA) 100 MG/ML SOSY Inject 100 mg into the skin every 28 (twenty-eight) days. 3 mL 3   metFORMIN (GLUCOPHAGE) 500 MG tablet Take 1 tablet (500 mg total) by mouth every morning. 30 tablet 0   montelukast (SINGULAIR) 10 MG tablet Take 1 tablet (10 mg total) by mouth at bedtime. 30 tablet 0   pravastatin (PRAVACHOL) 20 MG tablet Take 1 tablet (20 mg total) by mouth at bedtime. 30 tablet 3   TRUEPLUS LANCETS 28G MISC Check glucose 2 times a day 100 each 3   dextromethorphan-guaiFENesin (TUSSIN DM) 10-100 MG/5ML liquid Take 10 mLs by mouth every 4 (four) hours as needed for cough. (Patient not taking: Reported on 01/29/2021) 473 mL 0   No current facility-administered medications on file prior to visit.    Allergies  Allergen Reactions   Phenergan [Promethazine Hcl] Itching and Other (See Comments)    burning    Social History   Socioeconomic History   Marital status: Married    Spouse name: Not on file   Number of children: Not on file   Years of education: Not on file   Highest education level: Not on file  Occupational History   Not on file  Tobacco Use   Smoking status: Passive Smoke Exposure - Never Smoker   Smokeless tobacco: Never   Tobacco comments:    passive cig smoking at work  1994- 2012  Vaping Use   Vaping Use: Never used  Substance and Sexual Activity   Alcohol use: No   Drug use: No   Sexual activity: Yes  Other Topics Concern   Not on file  Social History Narrative   Not on file   Social Determinants of Health   Financial Resource Strain: High Risk   Difficulty of Paying Living Expenses: Hard  Food Insecurity: No Food Insecurity   Worried About Charity fundraiser in the Last Year: Never true   Ran Out of Food in the Last Year: Never true  Transportation Needs: No Transportation Needs   Lack of Transportation (Medical): No   Lack of Transportation (Non-Medical): No  Physical Activity: Inactive   Days  of Exercise per Week: 0 days   Minutes of Exercise per Session: 0 min  Stress: No Stress Concern Present   Feeling of Stress : Not at all  Social Connections: Moderately Isolated   Frequency of Communication with Friends and Family: Three times a week   Frequency of Social Gatherings with Friends and Family: Once a week   Attends Religious Services: Never   Marine scientist or Organizations: No   Attends Archivist Meetings: Never   Marital Status: Married  Human resources officer Violence: Not At Risk   Fear  of Current or Ex-Partner: No   Emotionally Abused: No   Physically Abused: No   Sexually Abused: No    Family History  Problem Relation Age of Onset   Hypertension Mother    Hypertension Father    Heart Problems Brother        Heart transplant, pt does not know cause    Rectal cancer Brother    Colon cancer Neg Hx    Esophageal cancer Neg Hx    Stomach cancer Neg Hx     Past Surgical History:  Procedure Laterality Date   ANTERIOR CERVICAL DECOMP/DISCECTOMY FUSION  ~ 1995   "titanium"   APPENDECTOMY  ~ [place]     x 2  L 4disectomy   COLONOSCOPY W/ POLYPECTOMY     INGUINAL HERNIA REPAIR Right ~ [place]  ~ 2000; ~2006   NASAL SINUS SURGERY  ~ 2007   TOTAL KNEE ARTHROPLASTY Left 08/06/2017   Procedure: LEFT TOTAL KNEE ARTHROPLASTY;  Surgeon: Leandrew Koyanagi, MD;  Location: [facility];  Service: Orthopedics;  Laterality: Left;    ROS: Review of Systems Negative except as stated above  PHYSICAL EXAM: BP (!) 170/93   Pulse 65   Ht _0  (1.778 m)   Wt 191 lb 4 oz (86.8 kg)   SpO2 97%   BMI 27.44 kg/m   Wt Readings from Last 3 Encounters:  01/29/21 191 lb 4 oz (86.8 kg)  08/20/20 192 lb (87.1 kg)  02/13/20 194 lb 9.6 oz (88.3 kg)    Physical Exam   General appearance - alert, well appearing, older African-American male and in no distress Mental status - normal mood, behavior, speech, dress, motor activity, and thought  processes Neck - supple, no significant adenopathy Chest -breath sounds mildly decreased bilaterally but without wheezes or crackles. Heart - normal rate, regular rhythm, normal S1, S2, no murmurs, rubs, clicks or gallops Extremities - trace LE edema Diabetic Foot Exam - Simple   Simple Foot Form Visual Inspection No deformities, no ulcerations, no other skin breakdown bilaterally: Yes Sensation Testing Intact to touch and monofilament testing bilaterally: Yes Pulse Check Posterior Tibialis and Dorsalis pulse intact bilaterally: Yes Comments     CMP Latest Ref Rng & Units 01/29/2021 12/15/2019 12/03/2019  Glucose 70 - 99 mg/dL 90 76 124(H)  BUN 8 - 27 mg/dL _1 Creatinine 0.76 - 1.27 mg/dL 1.42(H) 1.07 1.26(H)  Sodium 134 - 144 mmol/L 143 142 137  Potassium 3.5 - 5.2 mmol/L 4.0 3.8 4.2  Chloride 96 - 106 mmol/L 102 102 103  CO2 20 - 29 mmol/L _2 Calcium 8.6 - 10.2 mg/dL 9.5 8.3(L) 8.6(L)  Total Protein 6.0 - 8.5 g/dL 7.0 - -  Total Bilirubin 0.0 - 1.2 mg/dL 0.6 - -  Alkaline Phos 44 - 121 IU/L 45 - -  AST 0 - 40 IU/L 21 - -  ALT 0 - 44 IU/L 14 - -   Lipid Panel     Component Value Date/Time   CHOL 224 (H) 01/29/2021 1109   TRIG 71 01/29/2021 1109   HDL 72 01/29/2021 1109   CHOLHDL 3.1 01/29/2021 1109   CHOLHDL 2.4 03/05/2016 0824   VLDL 12 03/05/2016 0824   LDLCALC 140 (H) 01/29/2021 1109    CBC    Component Value Date/Time   WBC 3.4 01/29/2021 1109   WBC 13.3 (H) 12/03/2019 0125   RBC 5.02 01/29/2021 1109  RBC 4.80 12/03/2019 0125   HGB 14.9 01/29/2021 1109   HCT 45.0 01/29/2021 1109   PLT 192 01/29/2021 1109   MCV 90 01/29/2021 1109   MCH 29.7 01/29/2021 1109   MCH 29.4 12/03/2019 0125   MCHC 33.1 01/29/2021 1109   MCHC 33.3 12/03/2019 0125   RDW 13.8 01/29/2021 1109   LYMPHSABS 1.7 12/01/2019 1943   LYMPHSABS 2.1 08/19/2018 1441   MONOABS 0.6 12/01/2019 1943   EOSABS 0.7 (H) 12/01/2019 1943   EOSABS 0.5 (H) 08/19/2018 1441   BASOSABS 0.1  12/01/2019 1943   BASOSABS 0.1 08/19/2018 1441    ASSESSMENT AND PLAN: 1. Type 2 diabetes mellitus without complication, without long-term current use of insulin ([place]) Patient to continue metformin, healthy eating habits and regular exercise. - POCT glucose (manual entry) - POCT glycosylated hemoglobin (Hb A1C) - CBC - Comprehensive metabolic panel - Lipid panel - Ambulatory referral to Ophthalmology - Microalbumin / creatinine urine ratio; Future  2. Hypertension associated with diabetes ([place]) Not at goal but he has not taken medicines as yet for the morning.  Home blood pressure readings per his report are good.  Continue current medications.  Advised to keep a log of readings.  We will have him see the clinical pharmacist in about 2 weeks for recheck  3. Eosinophilic asthma Stable on current inhalers and followed by pulmonary.  4. Hyperlipidemia associated with type 2 diabetes mellitus ([place]) Continue Pravachol.  Check lipid profile today.  5. Overweight (BMI 25.0-29.9) See #1 above.  6. Chronic midline low back pain without sciatica Patient with chronic lower back pain without radiculopathy.  He has history of 2 surgeries on his lower back.  He is requesting referral to pain specialist.  I recommend that we get some baseline x-rays. - Ambulatory referral to Pain Clinic - DG Lumbar Spine Complete; Future  7. Prostate cancer screening Patient agreeable to screening. - PSA  8. Need for immunization against influenza Given today.  He will follow-up with the clinical pharmacist in 2 weeks to get the first shot of Shingrix. - Flu Vaccine QUAD 37moIM (Fluarix, Fluzone & Alfiuria Quad PF)  9.  OSA on CPAP -Strongly encourage consistent use of his CPAP so that his insurance will continue to pay for it..  Form completed allowing him to continue to donate plasma.   Patient was given the opportunity to ask questions.  Patient verbalized understanding of the plan and was able to  repeat key elements of the plan.   Orders Placed This Encounter  Procedures   DG Lumbar Spine Complete   Flu Vaccine QUAD 620moM (Fluarix, Fluzone & Alfiuria Quad PF)   CBC   Comprehensive metabolic panel   Lipid panel   PSA   Microalbumin / creatinine urine ratio   Ambulatory referral to Ophthalmology   Ambulatory referral to Pain Clinic   POCT glucose (manual entry)   POCT glycosylated hemoglobin (Hb A1C)     Requested Prescriptions    No prescriptions requested or ordered in this encounter    Return in about 4 months (around 06/01/2021) for Give appt with [facility] 2 wks for BP check and Shingles shot.  DeKarle PlumberMD, FACP

## 2021-01-30 ENCOUNTER — Encounter: Payer: Self-pay | Admitting: Internal Medicine

## 2021-01-30 ENCOUNTER — Other Ambulatory Visit: Payer: Self-pay | Admitting: Internal Medicine

## 2021-01-30 ENCOUNTER — Telehealth: Payer: Self-pay

## 2021-01-30 DIAGNOSIS — E785 Hyperlipidemia, unspecified: Secondary | ICD-10-CM

## 2021-01-30 DIAGNOSIS — E119 Type 2 diabetes mellitus without complications: Secondary | ICD-10-CM

## 2021-01-30 DIAGNOSIS — J8283 Eosinophilic asthma: Secondary | ICD-10-CM

## 2021-01-30 LAB — COMPREHENSIVE METABOLIC PANEL
ALT: 14 IU/L (ref 0–44)
AST: 21 IU/L (ref 0–40)
Albumin/Globulin Ratio: 1.8 (ref 1.2–2.2)
Albumin: 4.5 g/dL (ref 3.8–4.8)
Alkaline Phosphatase: 45 IU/L (ref 44–121)
BUN/Creatinine Ratio: 11 (ref 10–24)
BUN: 15 mg/dL (ref 8–27)
Bilirubin Total: 0.6 mg/dL (ref 0.0–1.2)
CO2: 20 mmol/L (ref 20–29)
Calcium: 9.5 mg/dL (ref 8.6–10.2)
Chloride: 102 mmol/L (ref 96–106)
Creatinine, Ser: 1.42 mg/dL — ABNORMAL HIGH (ref 0.76–1.27)
Globulin, Total: 2.5 g/dL (ref 1.5–4.5)
Glucose: 90 mg/dL (ref 70–99)
Potassium: 4 mmol/L (ref 3.5–5.2)
Sodium: 143 mmol/L (ref 134–144)
Total Protein: 7 g/dL (ref 6.0–8.5)
eGFR: 56 mL/min/{1.73_m2} — ABNORMAL LOW (ref >59)

## 2021-01-30 LAB — CBC
Hematocrit: 45 % (ref 37.5–51.0)
Hemoglobin: 14.9 g/dL (ref 13.0–17.7)
MCH: 29.7 pg (ref 26.6–33.0)
MCHC: 33.1 g/dL (ref 31.5–35.7)
MCV: 90 fL (ref 79–97)
Platelets: 192 10*3/uL (ref 150–450)
RBC: 5.02 x10E6/uL (ref 4.14–5.80)
RDW: 13.8 % (ref 11.6–15.4)
WBC: 3.4 10*3/uL (ref 3.4–10.8)

## 2021-01-30 LAB — LIPID PANEL
Chol/HDL Ratio: 3.1 ratio (ref 0.0–5.0)
Cholesterol, Total: 224 mg/dL — ABNORMAL HIGH (ref 100–199)
HDL: 72 mg/dL (ref >39)
LDL Chol Calc (NIH): 140 mg/dL — ABNORMAL HIGH (ref 0–99)
Triglycerides: 71 mg/dL (ref 0–149)
VLDL Cholesterol Cal: 12 mg/dL (ref 5–40)

## 2021-01-30 LAB — PSA: Prostate Specific Ag, Serum: 0.7 ng/mL (ref 0.0–4.0)

## 2021-01-30 MED ORDER — PRAVASTATIN SODIUM 40 MG PO TABS: 40.0000 mg | ORAL_TABLET | Freq: Every day | ORAL | 1 refills | Status: DC

## 2021-01-30 NOTE — Telephone Encounter (Signed)
Contacted pt to go over lab results pt didn't answer lvm   Sent a CRM and forward labs to NT to give pt labs when they call back   

## 2021-01-30 NOTE — Progress Notes (Signed)
Let patient know that his blood cell counts are normal. Kidney function is not 100%.  Avoid taking over-the-counter medications like ibuprofen, Aleve, Advil, Naprosyn as these can make kidney function worse.  Okay to take Tylenol.  Please return to lab at his convenience to give urine sample to further evaluate kidney function.  Order entered. Liver function test normal. Cholesterol level is elevated at 140 with goal being less than 70.  Please inquire whether or not he has been taking the Pravachol consistently over the past 2 months.  If he has, then we need to increase the dose from 20 mg daily to 40 mg daily.  Please let me know his response so I know whether to send an updated prescription to his pharmacy. PSA which is the screening test for prostate cancer is normal.

## 2021-01-31 ENCOUNTER — Other Ambulatory Visit: Payer: Self-pay

## 2021-01-31 DIAGNOSIS — E119 Type 2 diabetes mellitus without complications: Secondary | ICD-10-CM

## 2021-01-31 NOTE — Telephone Encounter (Signed)
Requested medication (s) are due for refill today:   No for both  Requested medication (s) are on the active medication list:   Yes for both  Future visit scheduled:   Yes   Last ordered: metoprolol 01/22/2021 #30, 0 refills;   Montelukast 01/16/2021 #30, 0   Requesting a 1 yr supply   Requested Prescriptions  Pending Prescriptions Disp Refills   metFORMIN (GLUCOPHAGE) 500 MG tablet [Pharmacy Med Name: metFORMIN HCl 500 MG Oral Tablet] 30 tablet 11    Sig: TAKE 1 TABLET BY MOUTH IN  THE MORNING     Endocrinology:  Diabetes - Biguanides Failed - 01/30/2021 10:08 PM      Failed - Cr in normal range and within 360 days    Creat  Date Value Ref Range Status  01/14/2016 1.09 0.70 - 1.33 mg/dL Final    Comment:      For patients > or = 61 years of age: The upper reference limit for Creatinine is approximately 13% higher for people identified as African-American.      Creatinine, Ser  Date Value Ref Range Status  01/29/2021 1.42 (H) 0.76 - 1.27 mg/dL Final   Creatinine, Urine  Date Value Ref Range Status  04/02/2016 164 20 - 370 mg/dL Final          Failed - AA eGFR in normal range and within 360 days    GFR, Est African American  Date Value Ref Range Status  01/14/2016 87 >=60 mL/min Final   GFR calc Af Amer  Date Value Ref Range Status  12/15/2019 87 >59 mL/min/1.73 Final    Comment:    **[facility] currently reports eGFR in compliance with the current**   recommendations of the [employer]. [facility] will   update reporting as new guidelines are published from the NKF-ASN   Task force.    GFR, Est Non African American  Date Value Ref Range Status  01/14/2016 75 >=60 mL/min Final   GFR calc non Af Amer  Date Value Ref Range Status  12/15/2019 76 >59 mL/min/1.73 Final   eGFR  Date Value Ref Range Status  01/29/2021 56 (L) >59 mL/min/1.73 Final          Passed - HBA1C is between 0 and 7.9 and within 180 days    Hemoglobin A1C  Date Value Ref  Range Status  01/29/2021 6.0 (A) 4.0 - 5.6 % Final   HbA1c, POC (prediabetic range)  Date Value Ref Range Status  06/20/2019 6.3 5.7 - 6.4 % Final   Hgb A1c MFr Bld  Date Value Ref Range Status  12/02/2019 6.3 (H) 4.8 - 5.6 % Final    Comment:    (NOTE) Pre diabetes:          5.7%-6.4%  Diabetes:              >6.4%  Glycemic control for   <7.0% adults with diabetes           Passed - Valid encounter within last 6 months    Recent Outpatient Visits           2 days ago Type 2 diabetes mellitus without complication, without long-term current use of insulin ([place])   [place] Ladell Pier, MD   1 month ago Viral upper respiratory tract infection   [place], [place] W, NP   2 months ago Hyperlipidemia, unspecified hyperlipidemia type   [facility] [place]dron M, [place]   11 months ago Type 2 diabetes mellitus without complication, without long-term current use of insulin [facility])   [place], [name], [place]   1 year ago Hospital discharge follow-up   [place], MD       Future Appointments             In 1 week [place], Jarome [place]atin, [place]   In 4 months Ladell Pier, MD [place]             montelukast (SINGULAIR) 10 MG tablet [Pharmacy Med Name: Montelukast Sodium 10 MG Oral Tablet] 30 tablet 11    Sig: TAKE 1 TABLET BY MOUTH AT  BEDTIME     Pulmonology:  Leukotriene Inhibitors Passed - 01/30/2021 10:08 PM      Passed - Valid encounter within last 12 months    Recent Outpatient Visits           2 days ago Type 2 diabetes mellitus without complication, without long-term current use of insulin ([place])   [place] Ladell Pier, MD   1 month ago Viral  upper respiratory tract infection   [place], [place] W, NP   2 months ago Hyperlipidemia, unspecified hyperlipidemia type   [place], Levada Dy M, [place]   11 months ago Type 2 diabetes mellitus without complication, without long-term current use of insulin [facility])   [place], [name], [place]   1 year ago Hospital discharge follow-up   [place], Deborah B, MD       Future Appointments             In 1 week [place], Jarome [place]atin, [place]   In 4 months Wyn[place] Emer[place], Dalbert Batman, MD [place]

## 2021-02-01 LAB — MICROALBUMIN / CREATININE URINE RATIO
Creatinine, Urine: 220.4 mg/dL
Microalb/Creat Ratio: 2 mg/g creat (ref 0–29)
Microalbumin, Urine: 4.6 ug/mL

## 2021-02-03 ENCOUNTER — Other Ambulatory Visit: Payer: Self-pay | Admitting: Internal Medicine

## 2021-02-03 DIAGNOSIS — I10 Essential (primary) hypertension: Secondary | ICD-10-CM

## 2021-02-04 NOTE — Telephone Encounter (Signed)
Requested medication (s) are due for refill today. Medications due 01/15/21.  Requested medication (s) are on the active medication list  Yes  Future visit scheduled Yes, Pharmacy visit 02/13/21 and next OV 06/03/21.  Note to clinic-Requesting essential medications early. Routing for physician review.    Requested Prescriptions  Pending Prescriptions Disp Refills   hydrochlorothiazide (HYDRODIURIL) 12.5 MG tablet [Pharmacy Med Name: hydroCHLOROthiazide 12.5 MG Oral Tablet] 30 tablet 11    Sig: TAKE 1 TABLET BY MOUTH  DAILY     Cardiovascular: Diuretics - Thiazide Failed - 02/03/2021 11:06 PM      Failed - Cr in normal range and within 360 days    Creat  Date Value Ref Range Status  01/14/2016 1.09 0.70 - 1.33 mg/dL Final    Comment:      For patients > or = 61 years of age: The upper reference limit for Creatinine is approximately 13% higher for people identified as African-American.      Creatinine, Ser  Date Value Ref Range Status  01/29/2021 1.42 (H) 0.76 - 1.27 mg/dL Final   Creatinine, Urine  Date Value Ref Range Status  04/02/2016 164 20 - 370 mg/dL Final          Failed - Last BP in normal range    BP Readings from Last 1 Encounters:  01/29/21 (!) 170/93          Passed - Ca in normal range and within 360 days    Calcium  Date Value Ref Range Status  01/29/2021 9.5 8.6 - 10.2 mg/dL Final          Passed - K in normal range and within 360 days    Potassium  Date Value Ref Range Status  01/29/2021 4.0 3.5 - 5.2 mmol/L Final          Passed - Na in normal range and within 360 days    Sodium  Date Value Ref Range Status  01/29/2021 143 134 - 144 mmol/L Final          Passed - Valid encounter within last 6 months    Recent Outpatient Visits           6 days ago Type 2 diabetes mellitus without complication, without long-term current use of insulin ([place])   [place] Karle Plumber B, MD   2 months ago Viral upper  respiratory tract infection   [place], [place] W, NP   2 months ago Hyperlipidemia, unspecified hyperlipidemia type   [place], [place], [place]   11 months ago Type 2 diabetes mellitus without complication, without long-term current use of insulin [facility])   [place], [name], [place]   1 year ago Hospital discharge follow-up   [place], MD       Future Appointments             In 1 week Daisy Blossom, Jarome Matin, [place]   In 3 months Ladell Pier, MD [place]             lisinopril-hydrochlorothiazide (ZESTORETIC) 10-12.5 MG tablet [Pharmacy Med Name: Lisinopril-hydroCHLOROthiazide 10-12.5 MG Oral Tablet] 30 tablet 11    Sig: Take 1 tablet by mouth daily.     Cardiovascular:  ACEI + Diuretic Combos Failed - 02/03/2021 11:06  PM      Failed - Cr in normal range and within 180 days    Creat  Date Value Ref Range Status  01/14/2016 1.09 0.70 - 1.33 mg/dL Final    Comment:      For patients > or = 61 years of age: The upper reference limit for Creatinine is approximately 13% higher for people identified as African-American.      Creatinine, Ser  Date Value Ref Range Status  01/29/2021 1.42 (H) 0.76 - 1.27 mg/dL Final   Creatinine, Urine  Date Value Ref Range Status  04/02/2016 164 20 - 370 mg/dL Final          Failed - Last BP in normal range    BP Readings from Last 1 Encounters:  01/29/21 (!) 170/93          Passed - Na in normal range and within 180 days    Sodium  Date Value Ref Range Status  01/29/2021 143 134 - 144 mmol/L Final          Passed - K in normal range and within 180 days    Potassium  Date Value Ref Range Status  01/29/2021 4.0 3.5 - 5.2 mmol/L Final          Passed - Ca in normal range and  within 180 days    Calcium  Date Value Ref Range Status  01/29/2021 9.5 8.6 - 10.2 mg/dL Final          Passed - Patient is not pregnant      Passed - Valid encounter within last 6 months    Recent Outpatient Visits           6 days ago Type 2 diabetes mellitus without complication, without long-term current use of insulin ([place])   [place] Karle Plumber B, MD   2 months ago Viral upper respiratory tract infection   [place], [place] W, NP   2 months ago Hyperlipidemia, unspecified hyperlipidemia type   [place], [place], [place]   11 months ago Type 2 diabetes mellitus without complication, without long-term current use of insulin [facility])   [place], [name], [place]   1 year ago Hospital discharge follow-up   [place], Deborah B, MD       Future Appointments             In 1 week Daisy Blossom, Jarome Matin, [place]   In 3 months Wynetta Emery, Dalbert Batman, MD [place]

## 2021-02-05 NOTE — Telephone Encounter (Signed)
Patient dropped off other [employer] form. Medical Records part sent to Medical Records([facility]) dept. Portion for Dr. Wynetta Emery placed in PCP box.

## 2021-02-05 NOTE — Telephone Encounter (Signed)
Form given to provider 

## 2021-02-06 ENCOUNTER — Telehealth: Payer: Self-pay | Admitting: Internal Medicine

## 2021-02-06 DIAGNOSIS — E559 Vitamin D deficiency, unspecified: Secondary | ICD-10-CM | POA: Diagnosis not present

## 2021-02-06 DIAGNOSIS — E119 Type 2 diabetes mellitus without complications: Secondary | ICD-10-CM | POA: Diagnosis not present

## 2021-02-06 DIAGNOSIS — Z79899 Other long term (current) drug therapy: Secondary | ICD-10-CM | POA: Diagnosis not present

## 2021-02-06 DIAGNOSIS — Z1159 Encounter for screening for other viral diseases: Secondary | ICD-10-CM | POA: Diagnosis not present

## 2021-02-06 DIAGNOSIS — G8929 Other chronic pain: Secondary | ICD-10-CM | POA: Diagnosis not present

## 2021-02-06 DIAGNOSIS — G894 Chronic pain syndrome: Secondary | ICD-10-CM | POA: Diagnosis not present

## 2021-02-06 DIAGNOSIS — M545 Low back pain, unspecified: Secondary | ICD-10-CM | POA: Diagnosis not present

## 2021-02-06 DIAGNOSIS — M129 Arthropathy, unspecified: Secondary | ICD-10-CM | POA: Diagnosis not present

## 2021-02-06 NOTE — Telephone Encounter (Signed)
I have noted patient's message that he no longer takes metformin or gabapentin and that he has not taken them in the past 3 months.  I have called his mail order [facility] [phone] and request that they cancel any further shipment.  I was told that patient last had gabapentin sent out on 01/15/2021 for 90-day supply and metformin shipped 01/21/2021 for 30-day supply. I do note that on his most recent visit with me however patient reported that he was taking metformin. I received a follow-up form from his plasma donation center called [facility] requesting further information of his medical diagnoses, inquiring about any complications from his diabetes, recent lab results of A1c, GFR and creatinine and a list of his current medications.

## 2021-02-06 NOTE — Telephone Encounter (Signed)
Copied from [place] [phone]. Topic: General - Other >> Feb 06, 2021 12:54 PM Bayard Beaver wrote: Reason for UUE:[id number] called in states doesn't take gabapentin or metformin anymore and its been 3 months since he has been off of it.

## 2021-02-06 NOTE — Telephone Encounter (Signed)
Copied from [place] [phone]. Topic: General - Other >> Feb 06, 2021 12:48 PM Christian [name] A wrote: Reason for CRM: The patient would like to be contacted by a member of clinical staff when possible  The patient has concerns related to their medications  The patient would like to begin cessation of  metFORMIN (GLUCOPHAGE) 500 MG tablet [[id number]] and gabapentin (NEURONTIN) 300 MG capsule [[id number]]   Please contact further when possible

## 2021-02-06 NOTE — Telephone Encounter (Signed)
Will forward to provider  

## 2021-02-07 NOTE — Telephone Encounter (Signed)
Contacted pt and lvm making aware form is ready for pick up at the front desk

## 2021-02-12 NOTE — Progress Notes (Signed)
S:    PCP: Dr. Wynetta Emery  Patient presents to the clinic for hypertension evaluation, counseling, and management. Patient was referred and last seen by Primary Care Provider on 01/29/21 at which time his BP was 170/93 as he had not yet taken his medications but reported home BP 110s/60s-70s. Current medications were continued and referred for BP check with CPP and for Shingrix vaccination.   Today, patient arrives in good spirits. Reports that he has not taken his BP medications today because it is early and he hasn't eaten yet which is when he takes his medications. Reports usual home BP of 130/74. He did not bring a log today or BP cuff to confirm these readings. Denies dizziness, lightheadedness, headaches, blurred vision, chest pain, swelling.   Medication adherence reported with no missed doses but he has not taken his medications today.  Current BP Medications include:  lisinopril-HCTZ 10-12.5 mg daily, HCTZ 12.5 mg daily, furosemide 20 mg daily  Antihypertensives tried in the past include: amlodipine 10 mg (stopped 11/2019 due to swelling in legs and added extra 12.5 mg of HCTZ at that time)   Family / Social history:  -FHX: HTN mother and father -Tobacco: never smoker  O:  Home BP readings: reports BP 130/74 from memory (does not bring a log or machine to confirm). Uses a bicep cuff.   Last 3 Office BP readings: BP Readings from Last 3 Encounters:  02/13/21 (!) 162/93  01/29/21 (!) 170/93  08/20/20 132/86    BMET    Component Value Date/Time   NA 143 01/29/2021 1109   K 4.0 01/29/2021 1109   CL 102 01/29/2021 1109   CO2 20 01/29/2021 1109   GLUCOSE 90 01/29/2021 1109   GLUCOSE 124 (H) 12/03/2019 0125   BUN 15 01/29/2021 1109   CREATININE 1.42 (H) 01/29/2021 1109   CREATININE 1.09 01/14/2016 1523   CALCIUM 9.5 01/29/2021 1109   GFRNONAA 76 12/15/2019 1227   GFRNONAA 75 01/14/2016 1523   GFRAA 87 12/15/2019 1227   GFRAA 87 01/14/2016 1523    Renal function: CrCl  cannot be calculated (Unknown ideal weight.).  Clinical ASCVD: No  The 10-year ASCVD risk score (Arnett DK, et al., 2019) is: 34%   Values used to calculate the score:     Age: 81 years     Sex: Male     Is Non-Hispanic African American: Yes     Diabetic: Yes     Tobacco smoker: No     Systolic Blood Pressure: 326 mmHg     Is BP treated: Yes     HDL Cholesterol: 72 mg/dL     Total Cholesterol: 224 mg/dL   A/P: Hypertension currently uncontrolled in office but he has not taken his medications. BP Goal = < 130/80 mmHg. Reports BP at goal at home but did not bring log of readings as instructed. Medication adherence reported other than not yet taking medications today. Since he has not taken them yet today we cannot make any medication adjustments as we don't have an accurate BP.  -Continue current medications -F/u visit in 2-3 weeks to reassess BP. I have instructed him to take his medications before this visit so we can see his BP on medications. I also instructed him to bring his BP machine or a log of readings to this visit.  -Counseled on lifestyle modifications for blood pressure control including reduced dietary sodium, increased exercise, adequate sleep.  Need for Shingrix vaccination:  Administered 1st (of 2) Shingrix  vaccines today in clinic. Instructed him to schedule a follow up visit in 2 months for the second vaccine to complete this series.   Total time in face-to-face counseling 20 minutes.  F/U Clinic Visit in 2 weeks for BP check, 2 months for 2nd Shingrix vaccine.    Rebbeca Paul, PharmD PGY2 Ambulatory Care Pharmacy Resident 02/13/2021 10:41 AM

## 2021-02-13 ENCOUNTER — Ambulatory Visit: Payer: Medicare Other | Attending: Internal Medicine | Admitting: Pharmacist

## 2021-02-13 ENCOUNTER — Other Ambulatory Visit: Payer: Self-pay

## 2021-02-13 VITALS — BP 162/93 | HR 61

## 2021-02-13 DIAGNOSIS — Z23 Encounter for immunization: Secondary | ICD-10-CM

## 2021-02-13 DIAGNOSIS — E1159 Type 2 diabetes mellitus with other circulatory complications: Secondary | ICD-10-CM | POA: Diagnosis not present

## 2021-02-13 DIAGNOSIS — I152 Hypertension secondary to endocrine disorders: Secondary | ICD-10-CM

## 2021-02-13 MED ORDER — SHINGRIX 50 MCG/0.5ML IM SUSR
0.5000 mL | Freq: Once | INTRAMUSCULAR | 0 refills | Status: DC
  Filled 2021-02-13: qty 1, 1d supply, fill #0

## 2021-02-18 ENCOUNTER — Telehealth: Payer: Self-pay | Admitting: Pulmonary Disease

## 2021-02-18 DIAGNOSIS — J8283 Eosinophilic asthma: Secondary | ICD-10-CM

## 2021-02-18 MED ORDER — BREZTRI AEROSPHERE 160-9-4.8 MCG/ACT IN AERO: 2.0000 | INHALATION_SPRAY | Freq: Two times a day (BID) | RESPIRATORY_TRACT | 0 refills | Status: DC

## 2021-02-18 MED ORDER — BREZTRI AEROSPHERE 160-9-4.8 MCG/ACT IN AERO: 2.0000 | INHALATION_SPRAY | Freq: Two times a day (BID) | RESPIRATORY_TRACT | 3 refills | Status: DC

## 2021-02-18 NOTE — Telephone Encounter (Signed)
Called and spoke with pt to verify which inhaler he needed to have refilled as we no longer have breo on med list. While speaking with pt, pt stated inhaler was breztri, not breo. Have sent 1 inhaler to pt's local pharmacy so he can go ahead and pick it up and also sent Rx to mail order pharmacy for pt. Also scheduled pt a f/u with Dr. Jenetta Downer. Nothing further needed.

## 2021-02-18 NOTE — Telephone Encounter (Signed)
Patient is returning phone call. Patient phone number is [phone]. 

## 2021-02-18 NOTE — Telephone Encounter (Signed)
ATC Patient.  LM to call back. 

## 2021-02-20 ENCOUNTER — Telehealth: Payer: Self-pay | Admitting: Internal Medicine

## 2021-02-20 DIAGNOSIS — M545 Low back pain, unspecified: Secondary | ICD-10-CM | POA: Diagnosis not present

## 2021-02-20 DIAGNOSIS — E119 Type 2 diabetes mellitus without complications: Secondary | ICD-10-CM | POA: Diagnosis not present

## 2021-02-20 DIAGNOSIS — Z79899 Other long term (current) drug therapy: Secondary | ICD-10-CM | POA: Diagnosis not present

## 2021-02-20 DIAGNOSIS — G894 Chronic pain syndrome: Secondary | ICD-10-CM | POA: Diagnosis not present

## 2021-02-20 NOTE — Telephone Encounter (Signed)
Contacted pt to make aware that Dr. Wynetta Emery had already filled out the form that he dropped off. Pt states the form was filled out incorrectly because he doesn't have DM. Made pt aware that the form states does the individual have any of the following history.. Pt states he stopped taking the metformin and gabapentin. Made pt aware that I will look into it   Asked Zelda about the form and per Zelda the form was filled out correctly. Returned pt call and lvm making pt aware that form was filled out correctly and if he has any questions or concerns to give [place] a call

## 2021-02-20 NOTE — Telephone Encounter (Signed)
Patient came in to drop off form Acknowledgement of Donor Participation in the Plasmapheresis Program and requested it be filled out by PCP. Advised to allow 7-14 bus days.

## 2021-02-26 ENCOUNTER — Telehealth: Payer: Self-pay | Admitting: Pharmacist

## 2021-02-26 NOTE — Telephone Encounter (Signed)
Received PAP re-enrollment application from [facility] to [place].   Will place provider portion in Dr. Judson Roch mailbox to be signed. Will mail patient his portion to be completed and returned to pharmacy team   Last OV on 08/20/20   Will route to triage to have patient scheduled for f/u appt (per last OV, anticipated f/u was supposed to be 3 months)   Knox Saliva, PharmD, MPH, BCPS Clinical Pharmacist (Rheumatology and Pulmonology)

## 2021-02-28 NOTE — Telephone Encounter (Signed)
Received signed provider portion. Awaiting patient portion. Placed in Pending info folder.

## 2021-03-01 ENCOUNTER — Encounter: Payer: Self-pay | Admitting: Pulmonary Disease

## 2021-03-01 ENCOUNTER — Other Ambulatory Visit: Payer: Self-pay

## 2021-03-01 ENCOUNTER — Ambulatory Visit (INDEPENDENT_AMBULATORY_CARE_PROVIDER_SITE_OTHER): Payer: Medicare Other | Admitting: Pulmonary Disease

## 2021-03-01 VITALS — BP 120/80 | HR 56 | Ht 70.0 in | Wt 185.0 lb

## 2021-03-01 DIAGNOSIS — Z9989 Dependence on other enabling machines and devices: Secondary | ICD-10-CM

## 2021-03-01 DIAGNOSIS — G4733 Obstructive sleep apnea (adult) (pediatric): Secondary | ICD-10-CM

## 2021-03-01 NOTE — Progress Notes (Signed)
Christian [name]    [id number]    Feb 01, 1961  Primary Care Physician:Johnson, Dalbert Batman, MD  Referring Physician: Ladell Pier, MD [address] [place],  [place] [address]  Chief complaint:   Patient being followed up for asthma, obstructive sleep apnea  HPI:  Has not been using CPAP  Loops and bloated  Obstructive sleep apnea was diagnosed over 10 years ago There was a time was using it regularly but lately has not been able to tolerate it  allergies seem to be better controlled Recent sleep study did reveal that he still has mild obstructive sleep apnea  He is on Nucala for his asthma  Mild shortness of breath with exertion History of secondhand smoke exposure  Usually goes to bed between 10 and 11:30 PM Takes him between 15 and 30 minutes to fall asleep Final awakening between 6 and 7 AM  Never smoker  History of hypertension, asthma, high cholesterol  Is currently on a course of prednisone, uses Advair 250  Outpatient Encounter Medications as of 08/20/2020  Medication Sig   ACCU-CHEK GUIDE test strip USE 1  TEST STRIP ONCE DAILY TO CHECK BLOOD SUGAR   albuterol (VENTOLIN HFA) 108 (90 Base) MCG/ACT inhaler INHALE 1 TO 2 PUFFS BY MOUTH EVERY 6 HOURS AS NEEDED FOR WHEEZING FOR SHORTNESS OF BREATH   benzonatate (TESSALON) 100 MG capsule Take 2 capsules (200 mg total) by mouth 3 (three) times daily as needed.   Blood Glucose Monitoring Suppl (ONE TOUCH ULTRA 2) w/Device KIT Use as directed 3 times daily E11.9   Budeson-Glycopyrrol-Formoterol (BREZTRI AEROSPHERE) 160-9-4.8 MCG/ACT AERO Inhale 2 puffs into the lungs 2 (two) times daily.   cetirizine (ZYRTEC) 10 MG tablet Take 1 tablet (10 mg total) by mouth daily.   doxycycline (VIBRA-TABS) 100 MG tablet Take 1 tablet (100 mg total) by mouth 2 (two) times daily.   fluticasone (FLONASE) 50 MCG/ACT nasal spray Place 2 sprays into both nostrils daily.   furosemide (LASIX) 20 MG tablet TAKE 1 TABLET BY  MOUTH ONCE DAILY FOR  ONE  WEEK  THEN  AS  NEEDED  FOR  SWELLING   gabapentin (NEURONTIN) 300 MG capsule Take 1 capsule (300 mg total) by mouth 3 (three) times daily.   hydrochlorothiazide (HYDRODIURIL) 12.5 MG tablet Take 1 tablet (12.5 mg total) by mouth daily.   ipratropium-albuterol (DUONEB) 0.5-2.5 (3) MG/3ML SOLN Take 3 mLs by nebulization every 4 (four) hours as needed. (Patient taking differently: Take 3 mLs by nebulization every 4 (four) hours as needed (for breathing).)   lisinopril-hydrochlorothiazide (ZESTORETIC) 10-12.5 MG tablet Take 1 tablet by mouth daily.   mepolizumab (NUCALA) 100 MG/ML SOSY Inject 100 mg into the skin every 28 (twenty-eight) days.   metFORMIN (GLUCOPHAGE) 500 MG tablet TAKE 1 TABLET BY MOUTH EVERY MORNING   montelukast (SINGULAIR) 10 MG tablet TAKE 1 TABLET BY MOUTH AT BEDTIME   pravastatin (PRAVACHOL) 20 MG tablet TAKE 1 TABLET BY MOUTH AT BEDTIME   predniSONE (DELTASONE) 20 MG tablet Take 1 tablet (20 mg total) by mouth daily.   TRUEPLUS LANCETS 28G MISC Check glucose 2 times a day   No facility-administered encounter medications on file as of 08/20/2020.    Allergies as of 08/20/2020 - Review Complete 08/20/2020  Allergen Reaction Noted   Phenergan [promethazine hcl] Itching and Other (See Comments) 09/15/2015    Past Medical History:  Diagnosis Date   Allergy    Arthritis    "  left knee" (01/22/2016)   Asthma    Chronic lower back pain    COPD (chronic obstructive pulmonary disease) (HCC)    Dyspnea    with extertion   High cholesterol    Hypertension    Neuropathy    OSA on CPAP    Secondhand smoke exposure    Sleep apnea    wears CPAP, not wearing now  for months   Type II diabetes mellitus ([place]) dx'd 01/17/2016    Past Surgical History:  Procedure Laterality Date   ANTERIOR CERVICAL DECOMP/DISCECTOMY FUSION  ~ 1995   "titanium"   APPENDECTOMY  ~ [place]     x 2  L 4disectomy   COLONOSCOPY W/ POLYPECTOMY     INGUINAL  HERNIA REPAIR Right ~ [place]  ~ 2000; ~2006   NASAL SINUS SURGERY  ~ 2007   TOTAL KNEE ARTHROPLASTY Left 08/06/2017   Procedure: LEFT TOTAL KNEE ARTHROPLASTY;  Surgeon: Leandrew Koyanagi, MD;  Location: [facility];  Service: Orthopedics;  Laterality: Left;    Family History  Problem Relation Age of Onset   Hypertension Mother    Hypertension Father    Heart Problems Brother        Heart transplant, pt does not know cause    Rectal cancer Brother    Colon cancer Neg Hx    Esophageal cancer Neg Hx    Stomach cancer Neg Hx     Social History   Socioeconomic History   Marital status: Married    Spouse name: Not on file   Number of children: Not on file   Years of education: Not on file   Highest education level: Not on file  Occupational History   Not on file  Tobacco Use   Smoking status: Passive Smoke Exposure - Never Smoker   Smokeless tobacco: Never Used   Tobacco comment: passive cig smoking at work  1994- 2012  Vaping Use   Vaping Use: Never used  Substance and Sexual Activity   Alcohol use: No   Drug use: No   Sexual activity: Yes  Other Topics Concern   Not on file  Social History Narrative   Not on file   Social Determinants of Health   Financial Resource Strain: Not on file  Food Insecurity: Not on file  Transportation Needs: Not on file  Physical Activity: Not on file  Stress: Not on file  Social Connections: Not on file  Intimate Partner Violence: Not on file    Review of Systems  Constitutional: Negative.   HENT: Negative.    Eyes: Negative.   Respiratory:  Negative for shortness of breath.   Cardiovascular: Negative.   Gastrointestinal: Negative.   Endocrine: Negative.   Genitourinary: Negative.    Vitals:   08/20/20 0911  BP: 132/86  Pulse: 69  Temp: 98 F (36.7 C)  SpO2: 97%     Physical Exam Constitutional:      Appearance: Normal appearance. He is well-developed.  HENT:     Head: Normocephalic and atraumatic.      Mouth/Throat:     Mouth: Mucous membranes are moist.  Eyes:     General:        Right eye: No discharge.        Left eye: No discharge.  Neck:     Thyroid: No thyromegaly.     Trachea: No tracheal deviation.  Cardiovascular:     Rate and Rhythm: Normal rate  and regular rhythm.  Pulmonary:     Effort: Pulmonary effort is normal. No respiratory distress.     Breath sounds: Normal breath sounds. No stridor. No wheezing, rhonchi or rales.  Musculoskeletal:     Cervical back: No rigidity or tenderness.  Neurological:     Mental Status: He is alert.  Psychiatric:        Mood and Affect: Mood normal.   Results of the Epworth flowsheet 11/25/2018  Sitting and reading 0  Watching TV 1  Sitting, inactive in a public place (e.g. a theatre or a meeting) 0  As a passenger in a car for an hour without a break 0  Lying down to rest in the afternoon when circumstances permit 3  Sitting and talking to someone 3  Sitting quietly after a lunch without alcohol 0  In a car, while stopped for a few minutes in traffic 0  Total score 7    Data Reviewed: Previous sleep study not available to be reviewed Most recent study shows mild obstructive sleep apnea  Compliance data not available, has not really been using it  Assessment:  Mild obstructive sleep apnea  -Encouraged to get back to using CPAP on a regular basis -We will try different mask  Asthma with exacerbation Eosinophilic asthma -To continue on Nucala -Avoid known triggers  Daytime sleepiness -Symptoms better controlled at present -Not really feeling sleepy  Shortness of breath -Stable status  he still has significant snoring and has been told about apneas when he has not use the CPAP  Plan/Recommendations: Continue CPAP as tolerated -Encouraged to try and use this on a regular basis  graded exercise as tolerated Avoid known triggers  Continue Nucala -Every 28 days  Follow-up in 3 months   Sherrilyn Rist  MD [facility] 08/20/2020, 9:17 AM  CC: Ladell Pier, MD

## 2021-03-01 NOTE — Patient Instructions (Signed)
Contact DME for supplies-new mask-nasal mask  Mask change  Try and use the machine on a regular basis  Was seen about 3 months  Call with significant concerns

## 2021-03-06 ENCOUNTER — Encounter: Payer: Self-pay | Admitting: Internal Medicine

## 2021-03-06 NOTE — Progress Notes (Signed)
Lab results received from [facility] pain clinic from [facility].  Labs done 02/06/2021. Vitamin D level 22 Rheumatoid factor 7 COVID IgG quantitative greater than 100 Uric acid level 9.6 Urine drug screen negative Creatinine 1.5, GFR 59 Sed rate 9 WBC 3.4, H/H 15/44.7, platelet count 99 X-ray of the lumbar spine revealed mild levoscoliosis, multilevel degenerative disc disease and multilevel facet arthropathy.

## 2021-03-19 DIAGNOSIS — R030 Elevated blood-pressure reading, without diagnosis of hypertension: Secondary | ICD-10-CM | POA: Diagnosis not present

## 2021-03-19 DIAGNOSIS — G894 Chronic pain syndrome: Secondary | ICD-10-CM | POA: Diagnosis not present

## 2021-03-19 DIAGNOSIS — M545 Low back pain, unspecified: Secondary | ICD-10-CM | POA: Diagnosis not present

## 2021-03-19 DIAGNOSIS — Z79899 Other long term (current) drug therapy: Secondary | ICD-10-CM | POA: Diagnosis not present

## 2021-03-19 DIAGNOSIS — G8929 Other chronic pain: Secondary | ICD-10-CM | POA: Diagnosis not present

## 2021-03-19 DIAGNOSIS — E119 Type 2 diabetes mellitus without complications: Secondary | ICD-10-CM | POA: Diagnosis not present

## 2021-04-01 NOTE — Telephone Encounter (Signed)
Called patient to determine status of Nucala PAP renewal application. Unable to reach patient. Left VM requesting return call  Knox Saliva, PharmD, MPH, BCPS Clinical Pharmacist (Rheumatology and Pulmonology)

## 2021-04-03 NOTE — Telephone Encounter (Signed)
Returned call to patient regarding Nucala PAP renewal application. He states that he completed his portion of application and came to clinic. The front desk staff faxed his portion of Nucala PAP application at some time in November 2022. Pharmacy team was not notified of this.  Called [facility] to [place] regarding application status. Per rep, they have not received any patient portion. I called patient back and advised him to return to clinic this week or next week to complete renewal application. Advised that if he cannot find his application at home, then we will print it uot for him to complete while on-site at clinic.  Per Geradine Girt rep, clinic must submit Medicare Red, White, and [employer] with applications this year (new requirement for PAP program)  Phone: [phone]  Knox Saliva, PharmD, MPH, BCPS Clinical Pharmacist (Rheumatology and Pulmonology)

## 2021-04-11 NOTE — Telephone Encounter (Signed)
Submitted Patient Assistance RENEWAL Application to [facility] to [place] for [place] along with provider portion, patient portion ,insurance card copy, med list PA and income documents. Will update patient when we receive a response.  Fax# [phone] Phone# [phone]  Knox Saliva, PharmD, MPH, BCPS Clinical Pharmacist (Rheumatology and Pulmonology)

## 2021-04-14 IMAGING — DX DG CHEST 1V PORT
1 series · 1 of 1 positions shown · non-contrast
Comparison: September 29, 2019

CLINICAL DATA: Dyspnea.  History of COPD.

EXAM:
PORTABLE CHEST 1 VIEW

[chest]
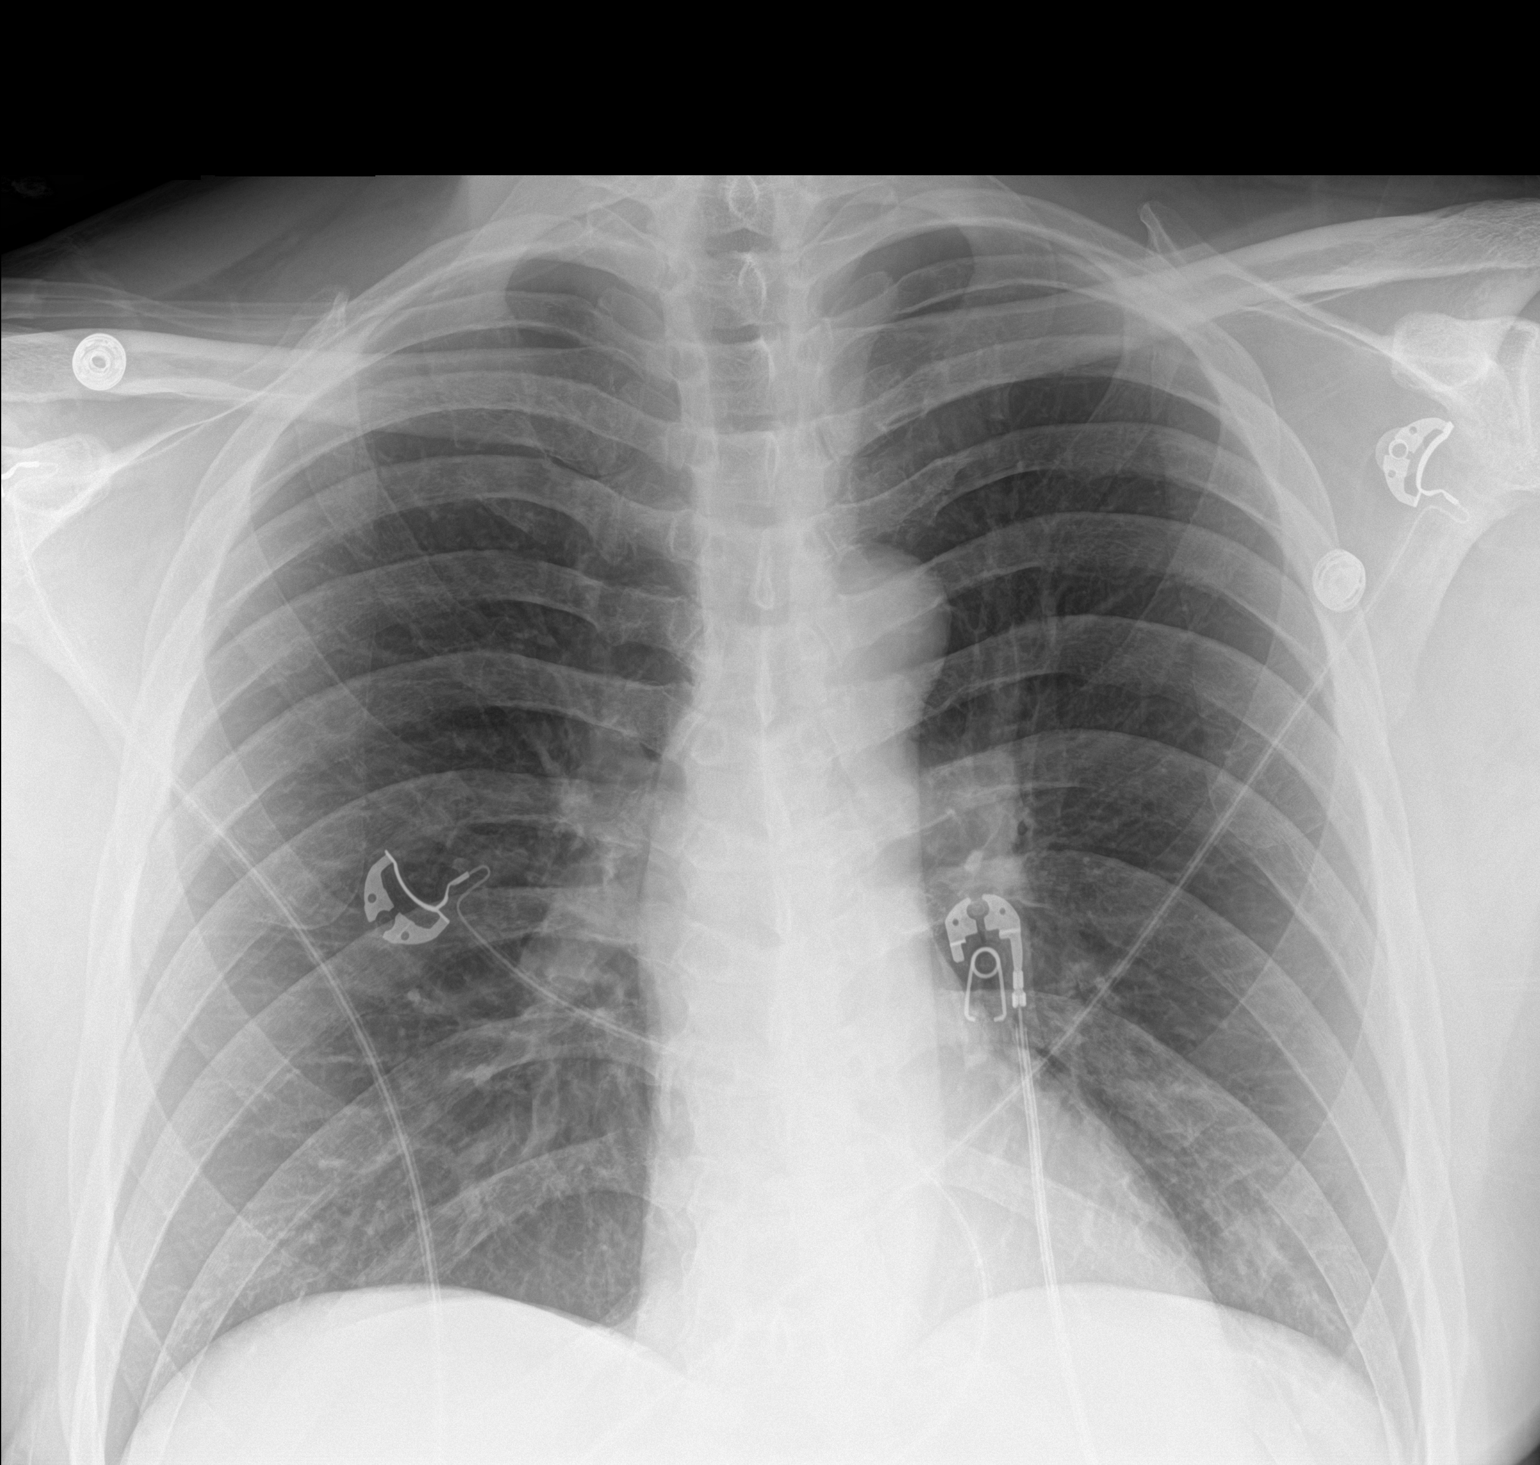

[1 of 1 positions shown; findings below may reference images not displayed]

FINDINGS: The heart size and mediastinal contours are within normal limits.
Both lungs are clear. The visualized skeletal structures are
unremarkable.
IMPRESSION: No active disease.

## 2021-04-15 ENCOUNTER — Other Ambulatory Visit: Payer: Self-pay

## 2021-04-15 ENCOUNTER — Ambulatory Visit: Payer: Medicare Other | Attending: Internal Medicine | Admitting: Pharmacist

## 2021-04-15 VITALS — BP 137/85 | HR 70

## 2021-04-15 DIAGNOSIS — I152 Hypertension secondary to endocrine disorders: Secondary | ICD-10-CM

## 2021-04-15 DIAGNOSIS — Z23 Encounter for immunization: Secondary | ICD-10-CM | POA: Diagnosis not present

## 2021-04-15 DIAGNOSIS — E1159 Type 2 diabetes mellitus with other circulatory complications: Secondary | ICD-10-CM

## 2021-04-15 MED ORDER — FUROSEMIDE 20 MG PO TABS: ORAL_TABLET | ORAL | 3 refills | Status: AC

## 2021-04-15 NOTE — Progress Notes (Signed)
° °  S:    PCP: Dr. Wynetta Emery  Patient presents to the clinic for hypertension evaluation, counseling, and management. Patient was referred and last seen by Primary Care Provider on 01/29/21. We saw him on 02/13/2021 but his BP was elevated as he had not taken his BP medications before that visit. Since that visit, he had an OV with Pulmonary where his BP was 120/80.  Today, patient arrives in good spirits. Reports medication adherence, however, he did not take them this morning. Denies dizziness, lightheadedness, headaches, blurred vision, chest pain. He requests a refill on his furosemide. He also tells me that he would like to restart metformin for his DM. He denies any high blood sugars or hyperglycemic symptoms but he tells me he wants it "just in case."    Current BP Medications include:  lisinopril-HCTZ 10-12.5 mg daily, HCTZ 12.5 mg daily, furosemide 20 mg daily  Antihypertensives tried in the past include: amlodipine 10 mg (stopped 11/2019 due to swelling in legs and added extra 12.5 mg of HCTZ at that time)   Family / Social history:  -FHX: HTN mother and father -Tobacco: never smoker  O:  Vitals:   04/15/21 0947  BP: 137/85  Pulse: 70   Home BP readings: reports BP 106/77 from memory (does not bring a log or machine to confirm). Uses a brachial cuff.   Last 3 Office BP readings: BP Readings from Last 3 Encounters:  04/15/21 137/85  03/01/21 120/80  02/13/21 (!) 162/93    BMET    Component Value Date/Time   NA 143 01/29/2021 1109   K 4.0 01/29/2021 1109   CL 102 01/29/2021 1109   CO2 20 01/29/2021 1109   GLUCOSE 90 01/29/2021 1109   GLUCOSE 124 (H) 12/03/2019 0125   BUN 15 01/29/2021 1109   CREATININE 1.42 (H) 01/29/2021 1109   CREATININE 1.09 01/14/2016 1523   CALCIUM 9.5 01/29/2021 1109   GFRNONAA 76 12/15/2019 1227   GFRNONAA 75 01/14/2016 1523   GFRAA 87 12/15/2019 1227   GFRAA 87 01/14/2016 1523    Renal function: CrCl cannot be calculated (Patient's most  recent lab result is older than the maximum 21 days allowed.).  Clinical ASCVD: No  The 10-year ASCVD risk score (Arnett DK, et al., 2019) is: 26%   Values used to calculate the score:     Age: 60 years     Sex: Male     Is Non-Hispanic African American: Yes     Diabetic: Yes     Tobacco smoker: No     Systolic Blood Pressure: 242 mmHg     Is BP treated: Yes     HDL Cholesterol: 72 mg/dL     Total Cholesterol: 224 mg/dL   A/P: Hypertension currently close to goal in office but he has not taken his medications. BP Goal = < 130/80 mmHg. Reports BP at goal at home but did not bring log of readings as instructed. Medication adherence reported.  -Continue current medications -Refill sent for furosemide.  -Will contact PCP concerning metformin. -Counseled on lifestyle modifications for blood pressure control including reduced dietary sodium, increased exercise, adequate sleep.  Need for Shingrix vaccination:  Administered 2nd (of 2) Shingrix vaccines today in clinic.   Total time in face-to-face counseling 20 minutes.  F/U Clinic Visit with PCP in Feb.   Benard Halsted, PharmD, [place], [place] [phone]

## 2021-04-16 DIAGNOSIS — M545 Low back pain, unspecified: Secondary | ICD-10-CM | POA: Diagnosis not present

## 2021-04-16 DIAGNOSIS — G894 Chronic pain syndrome: Secondary | ICD-10-CM | POA: Diagnosis not present

## 2021-04-16 DIAGNOSIS — R030 Elevated blood-pressure reading, without diagnosis of hypertension: Secondary | ICD-10-CM | POA: Diagnosis not present

## 2021-04-16 DIAGNOSIS — E119 Type 2 diabetes mellitus without complications: Secondary | ICD-10-CM | POA: Diagnosis not present

## 2021-04-16 DIAGNOSIS — Z79899 Other long term (current) drug therapy: Secondary | ICD-10-CM | POA: Diagnosis not present

## 2021-04-16 DIAGNOSIS — G8929 Other chronic pain: Secondary | ICD-10-CM | POA: Diagnosis not present

## 2021-04-16 IMAGING — DX DG ABDOMEN 1V
1 series · 1 of 1 positions shown · non-contrast
Comparison: None.

CLINICAL DATA: Abdominal pain.

EXAM:
ABDOMEN - 1 VIEW

[abdomen kub]
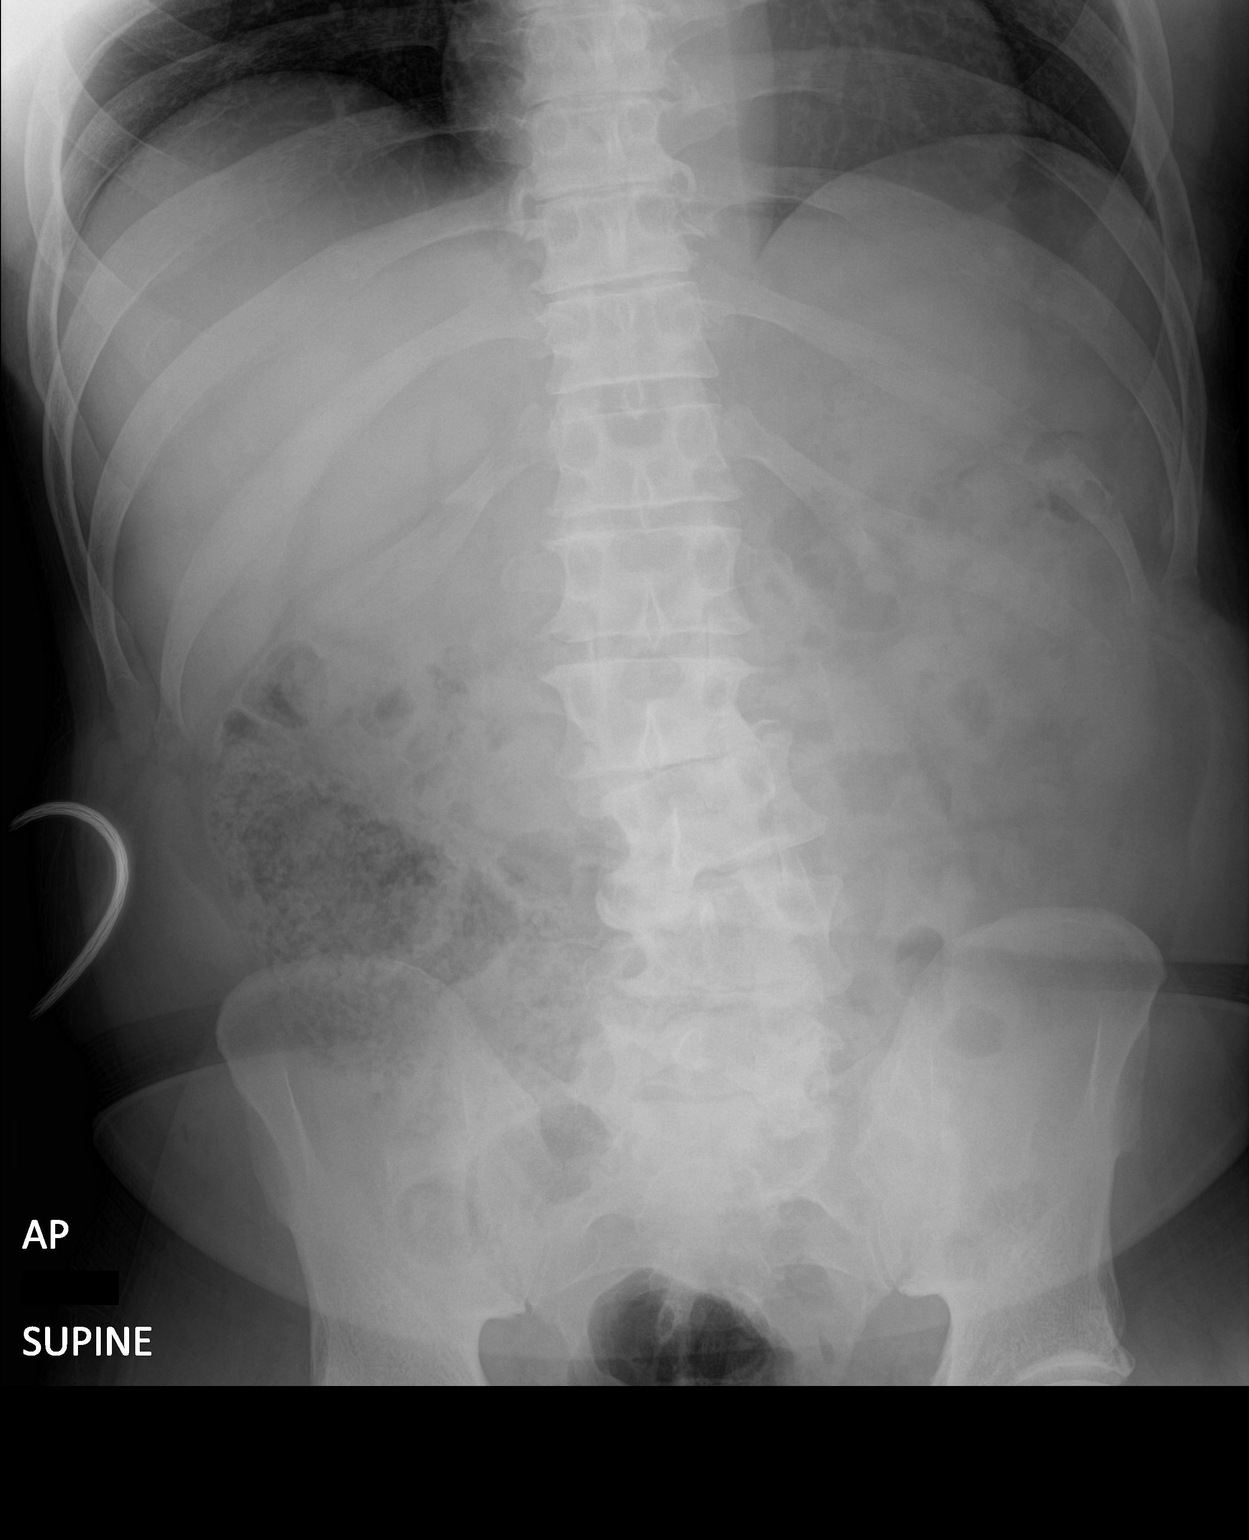

[1 of 1 positions shown; findings below may reference images not displayed]

FINDINGS: There is moderate stool within the ascending colon. Otherwise bowel
loops are normal in caliber. No free intraperitoneal air identified.
No evidence for organomegaly. Convex LEFT scoliosis of the lumbar
spine is associated with significant degenerative changes.
IMPRESSION: Moderate stool burden.

## 2021-04-16 IMAGING — CT CT ABD-PELV W/ CM
2 of 5 series · 16 of 46 positions shown, 18 images · IV contrast (APPLIED)
Comparison: None.

CLINICAL DATA: Left upper quadrant pain

EXAM:
CT ABDOMEN AND PELVIS WITH CONTRAST
TECHNIQUE: Multidetector CT imaging of the abdomen and pelvis was performed
using the standard protocol following bolus administration of
intravenous contrast.
CONTRAST:  100mL OMNIPAQUE IOHEXOL 300 MG/ML  SOLN

[Series 3: abdomen 5.0 · axial · 0.77mm/px · z∈[+826,+1211]mm · 13 of 89 slices shown, 15 images]
[im 6/89  soft-tissue]
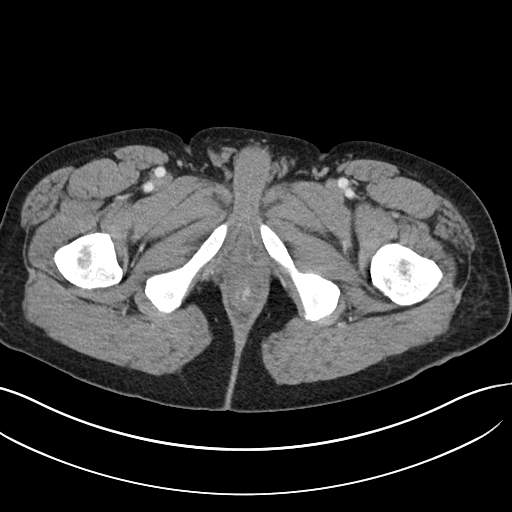
[im 6/89  bone]
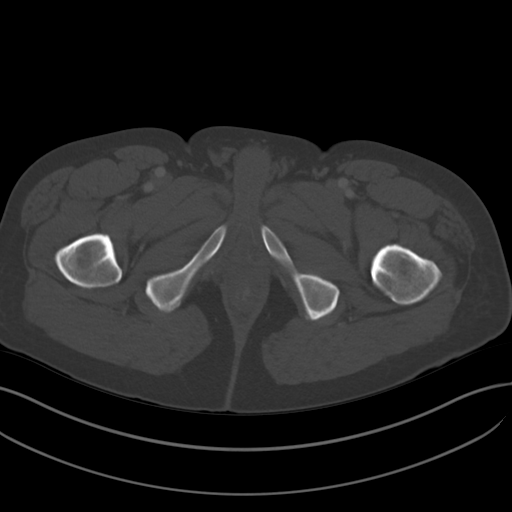
[im 12/89  soft-tissue]
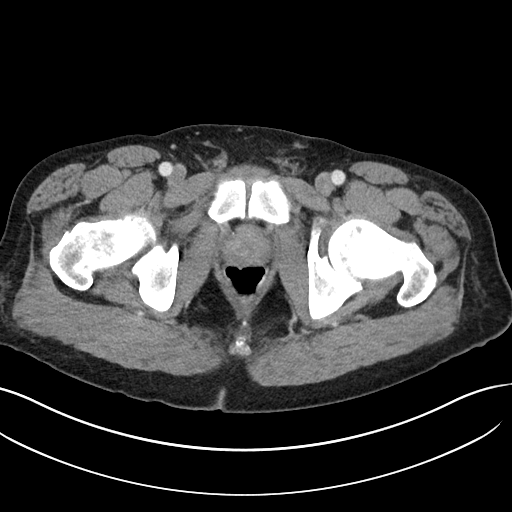
[im 18/89  soft-tissue]
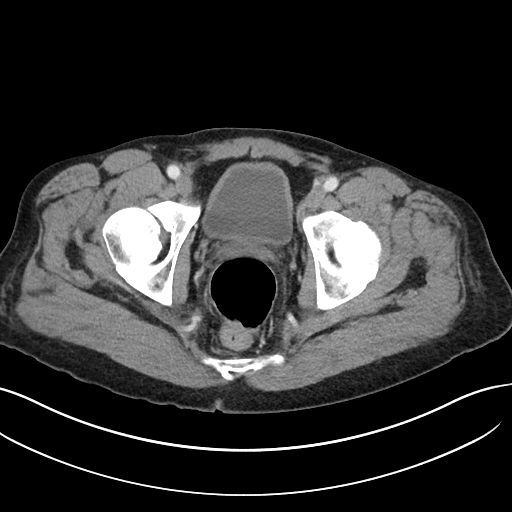
[im 24/89  soft-tissue]
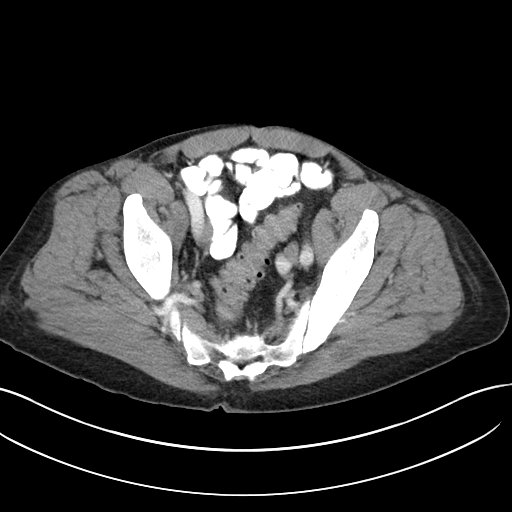
[im 30/89  soft-tissue]
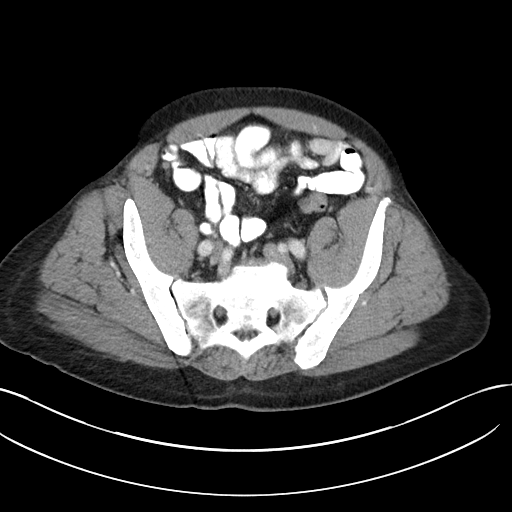
[im 36/89  soft-tissue]
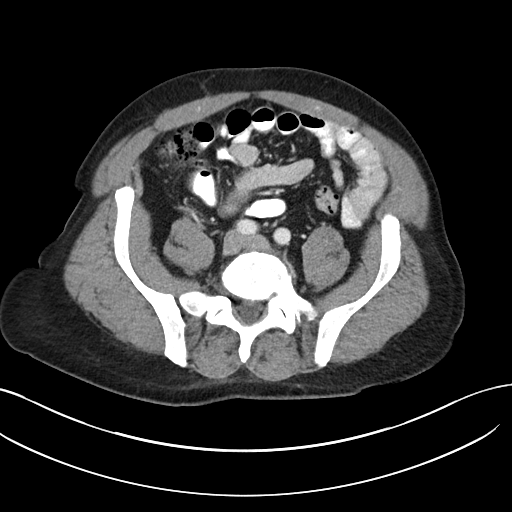
[im 47/89  soft-tissue]
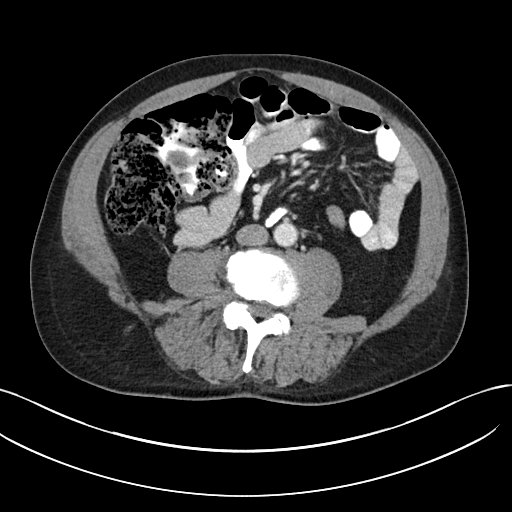
[im 53/89  soft-tissue]
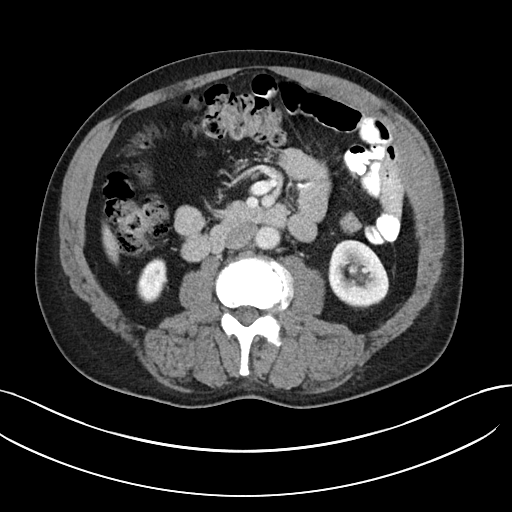
[im 59/89  soft-tissue]
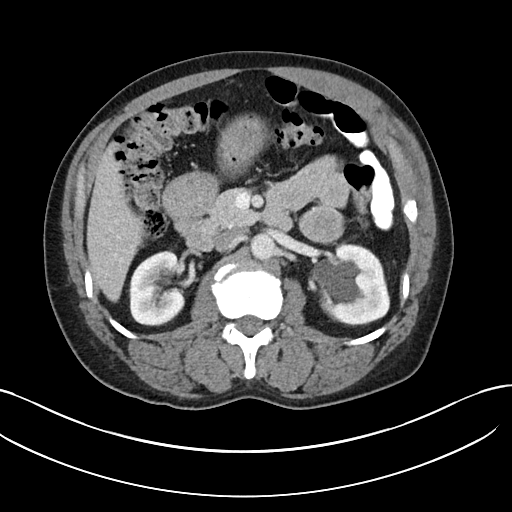
[im 59/89  bone]
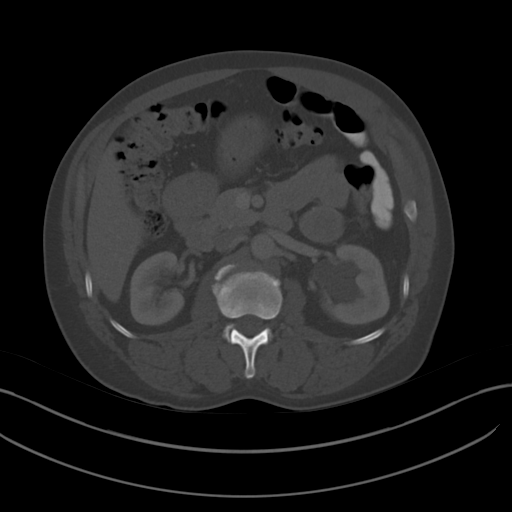
[im 65/89  soft-tissue]
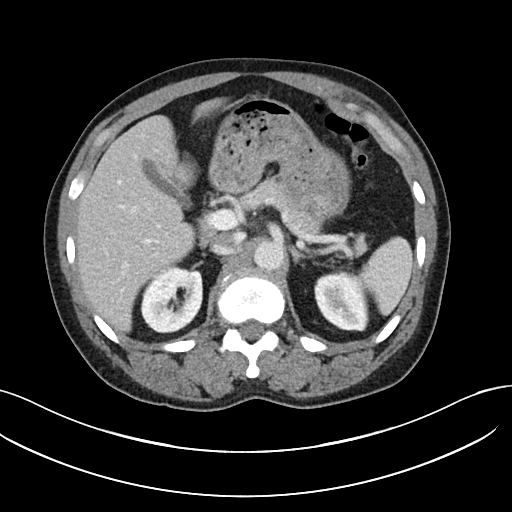
[im 71/89  soft-tissue]
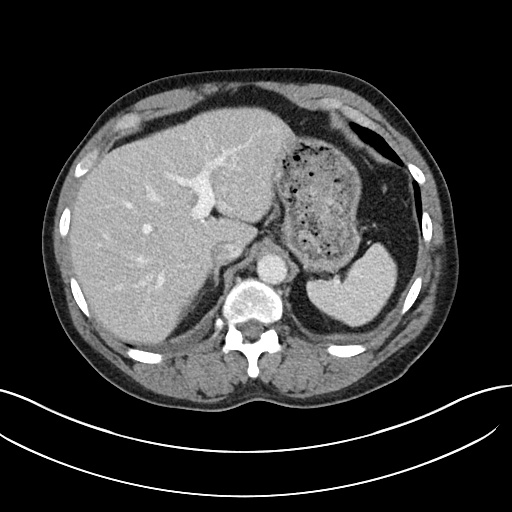
[im 77/89  soft-tissue]
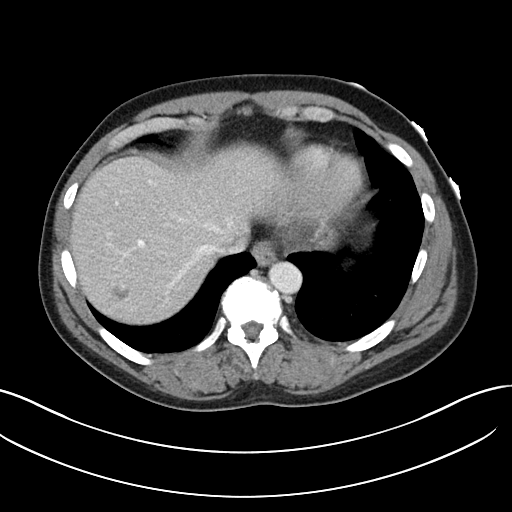
[im 83/89  soft-tissue]
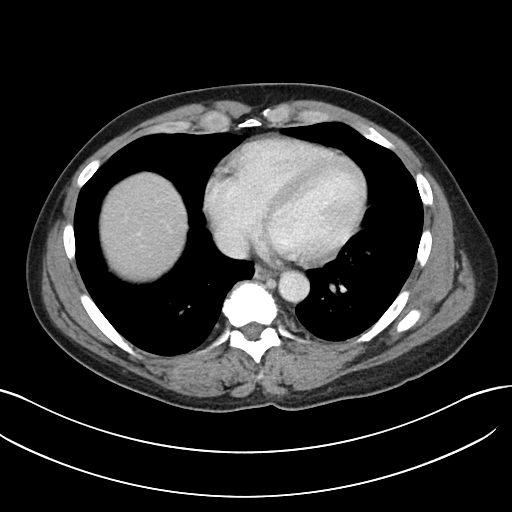

[Series 6: abdomen 3.0 mpr cor · coronal · 0.73mm/px · 3 of 85 slices shown]
[im 29/85  soft-tissue]
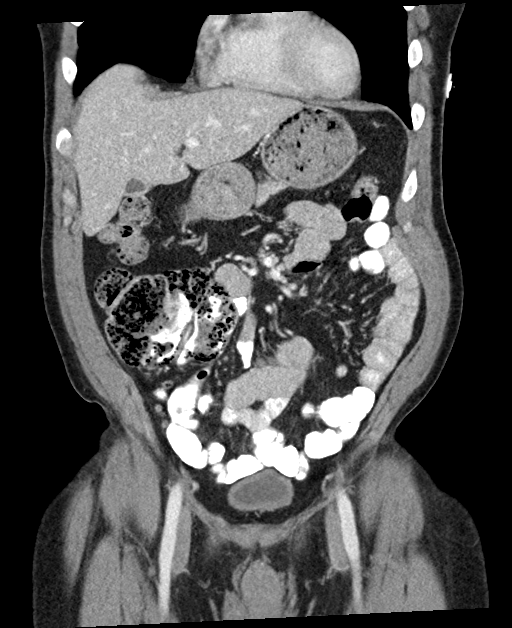
[im 38/85  soft-tissue]
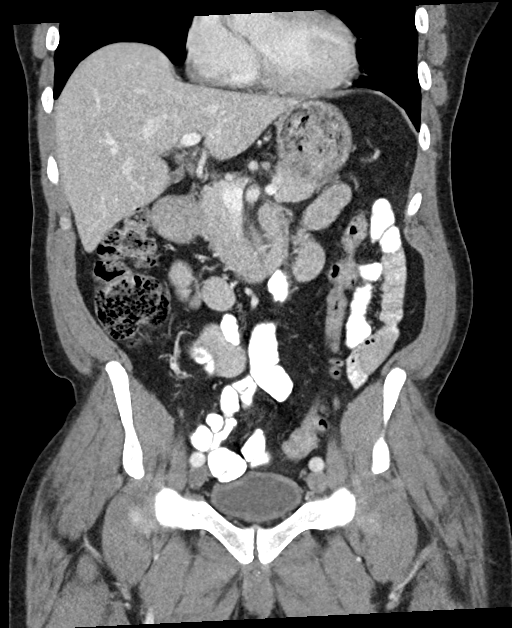
[im 47/85  soft-tissue]
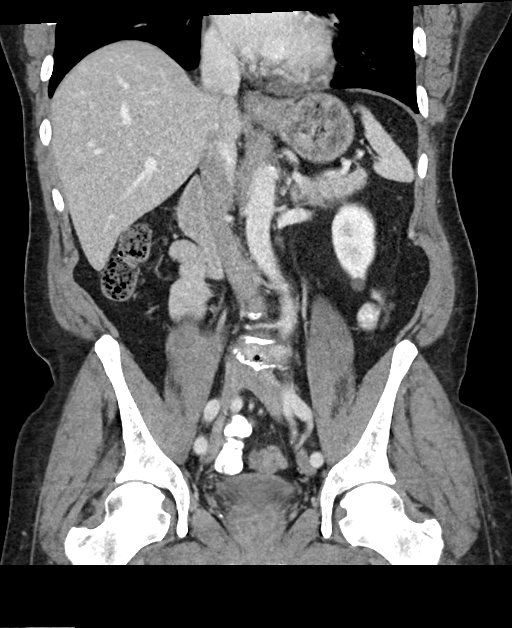

[16 of 46 positions shown; findings below may reference images not displayed]

FINDINGS: Lower chest: No acute abnormality.

Hepatobiliary: Small hypodensity in the right hepatic lobe measures
13 mm. There appears to be some peripheral contrast enhancement
which suggest this could be a hemangioma. Gallbladder contracted,
grossly unremarkable.

Pancreas: No focal abnormality or ductal dilatation.

Spleen: No focal abnormality.  Normal size.

Adrenals/Urinary Tract: Left renal parapelvic cysts. Left lower pole
cyst. No hydronephrosis. Adrenal glands and urinary bladder
unremarkable.

Stomach/Bowel: Normal appendix. Few scattered sigmoid diverticula.
No active diverticulitis. Stomach and small bowel decompressed,
unremarkable.

Vascular/Lymphatic: No evidence of aneurysm or adenopathy.

Reproductive: No visible focal abnormality.

Other: No free fluid or free air. Small umbilical hernia containing
fat.

Musculoskeletal: No acute bony abnormality. Degenerative changes in
the lumbar spine.
IMPRESSION: No acute findings in the abdomen or pelvis.

13 mm hypodensity within the right hepatic lobe, favor hemangioma.

Small umbilical hernia containing fat.

## 2021-04-17 ENCOUNTER — Telehealth: Payer: Self-pay | Admitting: Pulmonary Disease

## 2021-04-17 ENCOUNTER — Other Ambulatory Visit (HOSPITAL_COMMUNITY): Payer: Self-pay

## 2021-04-17 DIAGNOSIS — J8283 Eosinophilic asthma: Secondary | ICD-10-CM

## 2021-04-17 MED ORDER — BREZTRI AEROSPHERE 160-9-4.8 MCG/ACT IN AERO: 2.0000 | INHALATION_SPRAY | Freq: Two times a day (BID) | RESPIRATORY_TRACT | 0 refills | Status: DC

## 2021-04-17 NOTE — Telephone Encounter (Signed)
Called and spoke with pt letting him know the info per pharmacy team and he verbalized understanding. Pt stated that he has not applied for pt assistance but is willing to apply to see if he is approved.  Application has been put up front for pt along with the sample. Nothing further needed.

## 2021-04-17 NOTE — Telephone Encounter (Signed)
Sample has been placed up front for pt. Called and spoke with pt letting him know that this had been done and he verbalized understanding.  Due to the cost of the [place], pt wants to know if there is another inhaler that is more affordable.  Routing to Dr. Jenetta Downer as well as prior Josem Kaufmann team. Please advise.

## 2021-04-17 NOTE — Telephone Encounter (Signed)
Christian [name] returns a co-pay of $47. This is a standard co-pay for this class medications. Pt will need to apply for pt assistance if needed.

## 2021-05-02 MED ORDER — BREZTRI AEROSPHERE 160-9-4.8 MCG/ACT IN AERO: 2.0000 | INHALATION_SPRAY | Freq: Two times a day (BID) | RESPIRATORY_TRACT | 10 refills | Status: DC

## 2021-05-02 NOTE — Addendum Note (Signed)
Addended by: Dessie Coma on: 05/02/2021 10:39 AM   Modules accepted: Orders

## 2021-05-02 NOTE — Telephone Encounter (Signed)
I called the patient and let him know that we have received his application and we will get it faxed to the patient assistance for him and he voices understanding. Nothing further needed.

## 2021-05-07 ENCOUNTER — Other Ambulatory Visit: Payer: Self-pay | Admitting: Internal Medicine

## 2021-05-07 DIAGNOSIS — J8283 Eosinophilic asthma: Secondary | ICD-10-CM

## 2021-05-07 DIAGNOSIS — E785 Hyperlipidemia, unspecified: Secondary | ICD-10-CM

## 2021-05-07 DIAGNOSIS — I10 Essential (primary) hypertension: Secondary | ICD-10-CM

## 2021-05-07 DIAGNOSIS — J4541 Moderate persistent asthma with (acute) exacerbation: Secondary | ICD-10-CM

## 2021-05-07 MED ORDER — PRAVASTATIN SODIUM 40 MG PO TABS: 40.0000 mg | ORAL_TABLET | Freq: Every day | ORAL | 1 refills | Status: AC

## 2021-05-07 MED ORDER — IPRATROPIUM-ALBUTEROL 0.5-2.5 (3) MG/3ML IN SOLN: 3.0000 mL | RESPIRATORY_TRACT | 1 refills | Status: DC | PRN

## 2021-05-07 MED ORDER — LISINOPRIL-HYDROCHLOROTHIAZIDE 10-12.5 MG PO TABS: 1.0000 | ORAL_TABLET | Freq: Every day | ORAL | 11 refills | Status: AC

## 2021-05-07 MED ORDER — ALBUTEROL SULFATE HFA 108 (90 BASE) MCG/ACT IN AERS: INHALATION_SPRAY | RESPIRATORY_TRACT | 1 refills | Status: DC

## 2021-05-07 MED ORDER — MONTELUKAST SODIUM 10 MG PO TABS: 10.0000 mg | ORAL_TABLET | Freq: Every day | ORAL | 9 refills | Status: AC

## 2021-05-07 NOTE — Telephone Encounter (Signed)
Mail order pharmacy is calling to request medication transfer.  Current medications on patient list requested- but there are medications that are listed that are not current on patient medication list:  albuterol sulfate nebulizer solution advair diskus 100/50 gabapentin (NEURONTIN) 300 MG capsule  amlodipine besylate metFORMIN (GLUCOPHAGE) 500 MG tablet gabapentin (NEURONTIN) 300 MG capsule  This list is sent for office review- all other medications requested have been sent to [facility]

## 2021-05-07 NOTE — Telephone Encounter (Signed)
Medication Refill - Medication:   albuterol sulfate nebulizer solution albuterol (VENTOLIN HFA) 108 (90 Base) MCG/ACT inhaler montelukast (SINGULAIR) 10 MG tablet lisinopril-hydrochlorothiazide (ZESTORETIC) 10-12.5 MG tablet pravastatin (PRAVACHOL) 20 MG tablet  advair diskus 100/50 gabapentin (NEURONTIN) 300 MG capsule  albuterol sulfate nebulizer solution amlodipine besylate pravastatin (PRAVACHOL) 20 MG tablet  metFORMIN (GLUCOPHAGE) 500 MG tablet gabapentin (NEURONTIN) 300 MG capsule  Has the patient contacted their pharmacy? No. Pharmacy calling.  (Agent: If no, request that the patient contact the pharmacy for the refill. If patient does not wish to contact the pharmacy document the reason why and proceed with request.)   Preferred Pharmacy (with phone number or street name):  [facility] - [address], [address]  [place] [address]  Phone: [phone] Fax: [phone]  Hours: Not open 24 hours    Has the patient been seen for an appointment in the last year OR does the patient have an upcoming appointment? Yes.    Agent: Please be advised that RX refills may take up to 3 business days. We ask that you follow-up with your pharmacy.

## 2021-05-07 NOTE — Telephone Encounter (Signed)
Requested Prescriptions  Pending Prescriptions Disp Refills   albuterol (VENTOLIN HFA) 108 (90 Base) MCG/ACT inhaler 18 g 1    Sig: 1-2 puffs as needed for wheezing     Pulmonology:  Beta Agonists Failed - 05/07/2021 10:03 AM      Failed - One inhaler should last at least one month. If the patient is requesting refills earlier, contact the patient to check for uncontrolled symptoms.      Passed - Valid encounter within last 12 months    Recent Outpatient Visits          3 weeks ago Need for zoster vaccination   [place], Jarome Matin, RPH-CPP   2 months ago Hypertension associated with diabetes [facility])   [place], Annie Main L, RPH-CPP   3 months ago Type 2 diabetes mellitus without complication, without long-term current use of insulin ([place])   [place] Ladell Pier, MD   5 months ago Viral upper respiratory tract infection   [place], [place] W, NP   5 months ago Hyperlipidemia, unspecified hyperlipidemia type   [place], [place], [place]      Future Appointments            In 3 weeks Ladell Pier, MD [place]            montelukast (SINGULAIR) 10 MG tablet 30 tablet 9    Sig: Take 1 tablet (10 mg total) by mouth at bedtime.     Pulmonology:  Leukotriene Inhibitors Passed - 05/07/2021 10:03 AM      Passed - Valid encounter within last 12 months    Recent Outpatient Visits          3 weeks ago Need for zoster vaccination   [place], Jarome Matin, RPH-CPP   2 months ago Hypertension associated with diabetes [facility])   [place], Annie Main L, RPH-CPP   3 months ago Type 2 diabetes mellitus without complication, without long-term current use of insulin ([place])    [place] Ladell Pier, MD   5 months ago Viral upper respiratory tract infection   [place], Vernia Buff, NP   5 months ago Hyperlipidemia, unspecified hyperlipidemia type   [place], [name], [place]      Future Appointments            In 3 weeks Ladell Pier, MD [place]            lisinopril-hydrochlorothiazide (ZESTORETIC) 10-12.5 MG tablet 30 tablet 11    Sig: Take 1 tablet by mouth daily.     Cardiovascular:  ACEI + Diuretic Combos Failed - 05/07/2021 10:03 AM      Failed - Cr in normal range and within 180 days    Creat  Date Value Ref Range Status  01/14/2016 1.09 0.70 - 1.33 mg/dL Final    Comment:      For patients > or = 62 years of age: The upper reference limit for Creatinine is approximately 13% higher for people identified as African-American.      Creatinine, Ser  Date Value Ref Range Status  01/29/2021 1.42 (H) 0.76 - 1.27  mg/dL Final   Creatinine, Urine  Date Value Ref Range Status  04/02/2016 164 20 - 370 mg/dL Final         Passed - Na in normal range and within 180 days    Sodium  Date Value Ref Range Status  01/29/2021 143 134 - 144 mmol/L Final         Passed - K in normal range and within 180 days    Potassium  Date Value Ref Range Status  01/29/2021 4.0 3.5 - 5.2 mmol/L Final         Passed - Ca in normal range and within 180 days    Calcium  Date Value Ref Range Status  01/29/2021 9.5 8.6 - 10.2 mg/dL Final         Passed - Patient is not pregnant      Passed - Last BP in normal range    BP Readings from Last 1 Encounters:  04/15/21 137/85         Passed - Valid encounter within last 6 months    Recent Outpatient Visits          3 weeks ago Need for zoster vaccination   [place], Annie Main L, RPH-CPP   2 months ago Hypertension  associated with diabetes [facility])   [place], Annie Main L, RPH-CPP   3 months ago Type 2 diabetes mellitus without complication, without long-term current use of insulin ([place])   [place] Ladell Pier, MD   5 months ago Viral upper respiratory tract infection   [place], [place] W, NP   5 months ago Hyperlipidemia, unspecified hyperlipidemia type   [place], [place], [place]      Future Appointments            In 3 weeks Ladell Pier, MD [place]            pravastatin (PRAVACHOL) 40 MG tablet 90 tablet 1    Sig: Take 1 tablet (40 mg total) by mouth at bedtime.     Cardiovascular:  Antilipid - Statins Failed - 05/07/2021 10:03 AM      Failed - Total Cholesterol in normal range and within 360 days    Cholesterol, Total  Date Value Ref Range Status  01/29/2021 224 (H) 100 - 199 mg/dL Final         Failed - LDL in normal range and within 360 days    LDL Chol Calc (NIH)  Date Value Ref Range Status  01/29/2021 140 (H) 0 - 99 mg/dL Final         Passed - HDL in normal range and within 360 days    HDL  Date Value Ref Range Status  01/29/2021 72 >39 mg/dL Final         Passed - Triglycerides in normal range and within 360 days    Triglycerides  Date Value Ref Range Status  01/29/2021 71 0 - 149 mg/dL Final         Passed - Patient is not pregnant      Passed - Valid encounter within last 12 months    Recent Outpatient Visits          3 weeks ago Need for zoster vaccination   [place], RPH-CPP  2 months ago Hypertension associated with diabetes [facility])   [place], Annie Main L, RPH-CPP   3 months ago Type 2 diabetes mellitus without complication, without long-term current use of  insulin [facility])   [place] Ladell Pier, MD   5 months ago Viral upper respiratory tract infection   [place], [place] W, NP   5 months ago Hyperlipidemia, unspecified hyperlipidemia type   [place], [name], [place]      Future Appointments            In 3 weeks Ladell Pier, MD [place]            ipratropium-albuterol (DUONEB) 0.5-2.5 (3) MG/3ML SOLN 15 mL 1    Sig: Take 3 mLs by nebulization every 4 (four) hours as needed (for breathing).     Pulmonology:  Combination Products Passed - 05/07/2021 10:03 AM      Passed - Valid encounter within last 12 months    Recent Outpatient Visits          3 weeks ago Need for zoster vaccination   [place], Jarome Matin, RPH-CPP   2 months ago Hypertension associated with diabetes [facility])   [place], Jarome Matin, RPH-CPP   3 months ago Type 2 diabetes mellitus without complication, without long-term current use of insulin [facility])   [place], MD   5 months ago Viral upper respiratory tract infection   [place], Vernia Buff, NP   5 months ago Hyperlipidemia, unspecified hyperlipidemia type   [place], [name], [place]      Future Appointments            In 3 weeks Ladell Pier, MD [place]

## 2021-05-08 ENCOUNTER — Telehealth: Payer: Self-pay

## 2021-05-08 NOTE — Telephone Encounter (Signed)
Per [facility], patient's [place] PAP renewal application has been received and is being reviewed. Application is complete, but may take additional business days to process   Knox Saliva, PharmD, MPH, BCPS Clinical Pharmacist (Rheumatology and Pulmonology)

## 2021-05-08 NOTE — Telephone Encounter (Signed)
Received a fax from  [employer] regarding an approval for  [employer]  patient assistance from 05/07/2021 to 04/27/2022. Approval letter sent to scan center. ° °

## 2021-05-14 DIAGNOSIS — R030 Elevated blood-pressure reading, without diagnosis of hypertension: Secondary | ICD-10-CM | POA: Diagnosis not present

## 2021-05-14 DIAGNOSIS — M545 Low back pain, unspecified: Secondary | ICD-10-CM | POA: Diagnosis not present

## 2021-05-14 DIAGNOSIS — G894 Chronic pain syndrome: Secondary | ICD-10-CM | POA: Diagnosis not present

## 2021-05-14 DIAGNOSIS — E119 Type 2 diabetes mellitus without complications: Secondary | ICD-10-CM | POA: Diagnosis not present

## 2021-05-14 DIAGNOSIS — G8929 Other chronic pain: Secondary | ICD-10-CM | POA: Diagnosis not present

## 2021-05-14 DIAGNOSIS — Z79899 Other long term (current) drug therapy: Secondary | ICD-10-CM | POA: Diagnosis not present

## 2021-05-15 NOTE — Telephone Encounter (Signed)
Per rep at [facility], PAP renewal application is still processing.  Knox Saliva, PharmD, MPH, BCPS Clinical Pharmacist (Rheumatology and Pulmonology)

## 2021-05-23 NOTE — Telephone Encounter (Signed)
Received a fax from  [place] [facility] [place] regarding an approval for [place] patient assistance from now until the end of 2023. Patient has been called and notified, approval letter sent to scan center.

## 2021-06-03 ENCOUNTER — Encounter: Payer: Self-pay | Admitting: Internal Medicine

## 2021-06-03 ENCOUNTER — Other Ambulatory Visit: Payer: Self-pay

## 2021-06-03 ENCOUNTER — Ambulatory Visit: Payer: Medicare Other | Attending: Internal Medicine | Admitting: Internal Medicine

## 2021-06-03 VITALS — BP 151/94 | HR 81 | Resp 16 | Wt 187.0 lb

## 2021-06-03 DIAGNOSIS — E1142 Type 2 diabetes mellitus with diabetic polyneuropathy: Secondary | ICD-10-CM

## 2021-06-03 DIAGNOSIS — I152 Hypertension secondary to endocrine disorders: Secondary | ICD-10-CM

## 2021-06-03 DIAGNOSIS — E1159 Type 2 diabetes mellitus with other circulatory complications: Secondary | ICD-10-CM

## 2021-06-03 DIAGNOSIS — E785 Hyperlipidemia, unspecified: Secondary | ICD-10-CM

## 2021-06-03 DIAGNOSIS — J8283 Eosinophilic asthma: Secondary | ICD-10-CM

## 2021-06-03 DIAGNOSIS — E1169 Type 2 diabetes mellitus with other specified complication: Secondary | ICD-10-CM | POA: Diagnosis not present

## 2021-06-03 LAB — POCT GLYCOSYLATED HEMOGLOBIN (HGB A1C): HbA1c, POC (controlled diabetic range): 6.3 % (ref 0.0–7.0)

## 2021-06-03 MED ORDER — METFORMIN HCL ER 500 MG PO TB24: 500.0000 mg | ORAL_TABLET | Freq: Every day | ORAL | 5 refills | Status: DC

## 2021-06-03 MED ORDER — BENZONATATE 100 MG PO CAPS: 100.0000 mg | ORAL_CAPSULE | Freq: Two times a day (BID) | ORAL | 0 refills | Status: DC | PRN

## 2021-06-03 NOTE — Patient Instructions (Addendum)
Your blood pressure is not at goal.  Continue to take the lisinopril/hydrochlorothiazide and hydrochlorothiazide.  Blood pressure goal is 130/80 or lower.  Follow-up with clinical pharmacist in 2 weeks for repeat blood pressure check.  Please bring your blood pressure log readings with you.  We have changed the regular metformin to metformin XR.  Let me know if you tolerate this better.  I have sent the prescription to your pharmacy for the Tessalon Perles cough tablets to use as needed.  Please schedule a COVID-19 booster shot at any outside pharmacy.

## 2021-06-03 NOTE — Progress Notes (Signed)
Patient ID: Christian [name], male    DOB: Jul 03, 1960  MRN: [id number]  CC: Diabetes and Hypertension   Subjective: Christian [name] is a 61 y.o. male who presents for chronic ds management.  Last visit in 01/2021 His concerns today include:  History of HTN, DM type II, HL, moderate persistent asthma, vit D def, OSA on CPAP.  Pt does not have meds with him.  DM: Results for orders placed or performed in visit on 06/03/21  POCT glycosylated hemoglobin (Hb A1C)  Result Value Ref Range   Hemoglobin A1C     HbA1c POC (<> result, manual entry)     HbA1c, POC (prediabetic range)     HbA1c, POC (controlled diabetic range) 6.3 0.0 - 7.0 %  On last visit, pt told me he was taking Metformin but subsequently called back stating he had not been taking Metformin and Gabapentin x 3 mths.  I think this was somehow limiting his ability to be able to donate plasma. Today, he tells me he has restarted Metformin 500 mg and Gabapentin 300 mg TID it [facility]. Pt states he was told he could not donate if he was on these meds.  -Metformin causes diarrhea for him  -checking BS every morning; gives range 84-140 Doing okay with eating habits.  Decrease appetite.   HTN:  ON Lis/HCTZ 10/12.5 mg and HCTZ 12.5 mg and Furosemide PRN Limits salt in foods Does check BP once a day in middle of day.  Reports last reading was 130/70  Chronic Pain:  seeing [facility].   On Norco 7.5/325 mg three times a day.  Imaging of the lumbar spine done through them revealed degenerative disc disease at multiple levels and multilevel facet arthropathy.  HL:  taking Pravachol.  Did not start the 40 mg pills as yet.  Finishing up 20 mg  Asthma:  reports doing okay with asthma.  Confirms taking Singulair, Nucala injection every 28 days, Breztri  and duo-neb.  Requests prescription for [facility] for him to take as needed for cough.  HM:  has eye appt in March.  Reports having had 3 COVID  vaccine Patient Active Problem List   Diagnosis Date Noted   Severe persistent asthma with acute exacerbation 12/01/2019   Medication management 07/21/2019   Nasal polyps 06/21/2019   Allergic rhinitis 04/05/2019   OSA (obstructive sleep apnea) 04/05/2019   Presence of left artificial knee joint 95/62/1308   Eosinophilic asthma 65/78/4696   Primary osteoarthritis of left knee 02/23/2017   Chronic pain syndrome 04/02/2016   Intrinsic asthma 03/25/2016   Essential hypertension 01/23/2016   Hyperlipidemia 01/23/2016   Secondhand smoke exposure 01/23/2016   Diabetes mellitus with neuropathy ([place]) 01/23/2016     Current Outpatient Medications on File Prior to Visit  Medication Sig Dispense Refill   albuterol (VENTOLIN HFA) 108 (90 Base) MCG/ACT inhaler 1-2 puffs as needed for wheezing 18 g 1   Blood Glucose Monitoring Suppl (ONE TOUCH ULTRA 2) w/Device KIT Use as directed 3 times daily E11.9 1 each 0   Budeson-Glycopyrrol-Formoterol (BREZTRI AEROSPHERE) 160-9-4.8 MCG/ACT AERO Inhale 2 puffs into the lungs 2 (two) times daily. 10.7 g 10   cetirizine (ZYRTEC) 10 MG tablet Take 1 tablet (10 mg total) by mouth daily. 90 tablet 2   fluticasone (FLONASE) 50 MCG/ACT nasal spray Place 2 sprays into both nostrils daily. 16 g 6   furosemide (LASIX) 20 MG tablet TAKE 1 TABLET BY MOUTH ONCE  DAILY FOR  ONE  WEEK  THEN  AS  NEEDED  FOR  SWELLING 30 tablet 3   glucose blood (ACCU-CHEK GUIDE) test strip USE 1  TEST STRIP ONCE DAILY TO CHECK BLOOD SUGAR 100 each 1   hydrochlorothiazide (HYDRODIURIL) 12.5 MG tablet TAKE 1 TABLET BY MOUTH  DAILY 30 tablet 11   HYDROcodone-acetaminophen (NORCO) 7.5-325 MG tablet Take 1 tablet by mouth 3 (three) times daily as needed.     ipratropium-albuterol (DUONEB) 0.5-2.5 (3) MG/3ML SOLN Take 3 mLs by nebulization every 4 (four) hours as needed (for breathing). 15 mL 1   lisinopril-hydrochlorothiazide (ZESTORETIC) 10-12.5 MG tablet Take 1 tablet by mouth daily. 30 tablet  11   mepolizumab (NUCALA) 100 MG/ML SOSY Inject 100 mg into the skin every 28 (twenty-eight) days. 3 mL 3   montelukast (SINGULAIR) 10 MG tablet Take 1 tablet (10 mg total) by mouth at bedtime. 30 tablet 9   pravastatin (PRAVACHOL) 40 MG tablet Take 1 tablet (40 mg total) by mouth at bedtime. 90 tablet 1   TRUEPLUS LANCETS 28G MISC Check glucose 2 times a day 100 each 3   No current facility-administered medications on file prior to visit.    Allergies  Allergen Reactions   Phenergan [Promethazine Hcl] Itching and Other (See Comments)    burning    Social History   Socioeconomic History   Marital status: Married    Spouse name: Not on file   Number of children: Not on file   Years of education: Not on file   Highest education level: Not on file  Occupational History   Not on file  Tobacco Use   Smoking status: Never    Passive exposure: Yes   Smokeless tobacco: Never   Tobacco comments:    passive cig smoking at work  1994- 2012  Vaping Use   Vaping Use: Never used  Substance and Sexual Activity   Alcohol use: No   Drug use: No   Sexual activity: Yes  Other Topics Concern   Not on file  Social History Narrative   Not on file   Social Determinants of Health   Financial Resource Strain: High Risk   Difficulty of Paying Living Expenses: Hard  Food Insecurity: No Food Insecurity   Worried About Charity fundraiser in the Last Year: Never true   Ran Out of Food in the Last Year: Never true  Transportation Needs: No Transportation Needs   Lack of Transportation (Medical): No   Lack of Transportation (Non-Medical): No  Physical Activity: Inactive   Days of Exercise per Week: 0 days   Minutes of Exercise per Session: 0 min  Stress: No Stress Concern Present   Feeling of Stress : Not at all  Social Connections: Moderately Isolated   Frequency of Communication with Friends and Family: Three times a week   Frequency of Social Gatherings with Friends and Family: Once a  week   Attends Religious Services: Never   Marine scientist or Organizations: No   Attends Music therapist: Never   Marital Status: Married  Human resources officer Violence: Not At Risk   Fear of Current or Ex-Partner: No   Emotionally Abused: No   Physically Abused: No   Sexually Abused: No    Family History  Problem Relation Age of Onset   Hypertension Mother    Hypertension Father    Heart Problems Brother        Heart transplant, pt does not know  cause    Rectal cancer Brother    Colon cancer Neg Hx    Esophageal cancer Neg Hx    Stomach cancer Neg Hx     Past Surgical History:  Procedure Laterality Date   ANTERIOR CERVICAL DECOMP/DISCECTOMY FUSION  ~ 1995   "titanium"   APPENDECTOMY  ~ [place]     x 2  L 4disectomy   COLONOSCOPY W/ POLYPECTOMY     INGUINAL HERNIA REPAIR Right ~ 2000   LUMBAR [place]  ~ 2000; ~2006   NASAL SINUS SURGERY  ~ 2007   TOTAL KNEE ARTHROPLASTY Left 08/06/2017   Procedure: LEFT TOTAL KNEE ARTHROPLASTY;  Surgeon: Leandrew Koyanagi, MD;  Location: [facility];  Service: Orthopedics;  Laterality: Left;    ROS: Review of Systems Negative except as stated above  PHYSICAL EXAM: BP (!) 151/94    Pulse 81    Resp 16    Wt 187 lb (84.8 kg)    SpO2 95%    BMI 26.83 kg/m   Wt Readings from Last 3 Encounters:  06/03/21 187 lb (84.8 kg)  03/01/21 185 lb (83.9 kg)  01/29/21 191 lb 4 oz (86.8 kg)  Repeat blood pressure 154/89  Physical Exam   General appearance - alert, well appearing, and in no distress Mental status - normal mood, behavior, speech, dress, motor activity, and thought processes Neck - supple, no significant adenopathy Chest -breath sounds are decreased mildly bilaterally without wheezes or crackles. Heart - normal rate, regular rhythm, normal S1, S2, no murmurs, rubs, clicks or gallops Extremities - peripheral pulses normal, no pedal edema, no clubbing or cyanosis  CMP Latest Ref Rng & Units 01/29/2021  12/15/2019 12/03/2019  Glucose 70 - 99 mg/dL 90 76 124(H)  BUN 8 - 27 mg/dL 15 12 15   Creatinine 0.76 - 1.27 mg/dL 1.42(H) 1.07 1.26(H)  Sodium 134 - 144 mmol/L 143 142 137  Potassium 3.5 - 5.2 mmol/L 4.0 3.8 4.2  Chloride 96 - 106 mmol/L 102 102 103  CO2 20 - 29 mmol/L 20 27 23   Calcium 8.6 - 10.2 mg/dL 9.5 8.3(L) 8.6(L)  Total Protein 6.0 - 8.5 g/dL 7.0 - -  Total Bilirubin 0.0 - 1.2 mg/dL 0.6 - -  Alkaline Phos 44 - 121 IU/L 45 - -  AST 0 - 40 IU/L 21 - -  ALT 0 - 44 IU/L 14 - -   Lipid Panel     Component Value Date/Time   CHOL 224 (H) 01/29/2021 1109   TRIG 71 01/29/2021 1109   HDL 72 01/29/2021 1109   CHOLHDL 3.1 01/29/2021 1109   CHOLHDL 2.4 03/05/2016 0824   VLDL 12 03/05/2016 0824   LDLCALC 140 (H) 01/29/2021 1109    CBC    Component Value Date/Time   WBC 3.4 01/29/2021 1109   WBC 13.3 (H) 12/03/2019 0125   RBC 5.02 01/29/2021 1109   RBC 4.80 12/03/2019 0125   HGB 14.9 01/29/2021 1109   HCT 45.0 01/29/2021 1109   PLT 192 01/29/2021 1109   MCV 90 01/29/2021 1109   MCH 29.7 01/29/2021 1109   MCH 29.4 12/03/2019 0125   MCHC 33.1 01/29/2021 1109   MCHC 33.3 12/03/2019 0125   RDW 13.8 01/29/2021 1109   LYMPHSABS 1.7 12/01/2019 1943   LYMPHSABS 2.1 08/19/2018 1441   MONOABS 0.6 12/01/2019 1943   EOSABS 0.7 (H) 12/01/2019 1943   EOSABS 0.5 (H) 08/19/2018 1441   BASOSABS 0.1 12/01/2019 1943   BASOSABS  0.1 08/19/2018 1441    ASSESSMENT AND PLAN: 1. Diabetic peripheral neuropathy associated with type 2 diabetes mellitus ([place]) At goal.  Continue to encourage healthy eating habits.  He has been experiencing some diarrhea with regular metformin which he restarted since his last telephone call telling us that he had stopped taking it 3 months prior to our last visit in October.  I recommend changing to metformin XR to see whether he tolerates it better.  If he continues to get diarrhea, he will let me know.  Patient agreeable to this plan. - POCT glycosylated hemoglobin  (Hb A1C) - metFORMIN (GLUCOPHAGE XR) 500 MG 24 hr tablet; Take 1 tablet (500 mg total) by mouth daily with breakfast.  Dispense: 30 tablet; Refill: 5  2. Hypertension associated with diabetes ([place]) Not at goal.  However patient reports good home blood pressure readings.  I have made no changes today.  Request that he checks his blood pressure at least twice a week and record the readings.  Bring them into see the clinical pharmacist in 2 weeks for repeat blood pressure check.  Bring his device with him on that visit.  3. Hyperlipidemia associated with type 2 diabetes mellitus ([place]) Patient will increase the Pravachol to 40 mg.  I recommend that he can start taking 2 of the 20 mg tablets until he runs out then stop the 40 mg tablets.   4. Eosinophilic asthma Stable and followed by pulmonary. - benzonatate (TESSALON) 100 MG capsule; Take 1 capsule (100 mg total) by mouth 2 (two) times daily as needed for cough.  Dispense: 60 capsule; Refill: 0    Patient was given the opportunity to ask questions.  Patient verbalized understanding of the plan and was able to repeat key elements of the plan.   Orders Placed This Encounter  Procedures   POCT glycosylated hemoglobin (Hb A1C)     Requested Prescriptions   Signed Prescriptions Disp Refills   metFORMIN (GLUCOPHAGE XR) 500 MG 24 hr tablet 30 tablet 5    Sig: Take 1 tablet (500 mg total) by mouth daily with breakfast.   benzonatate (TESSALON) 100 MG capsule 60 capsule 0    Sig: Take 1 capsule (100 mg total) by mouth 2 (two) times daily as needed for cough.    Return in about 4 months (around 10/01/2021) for Appt with [facility] in 2 wks for BP check.  Karle Plumber, MD, FACP

## 2021-06-04 ENCOUNTER — Ambulatory Visit: Payer: Self-pay | Admitting: *Deleted

## 2021-06-04 NOTE — Telephone Encounter (Signed)
Summary: pt concern as blew his nose and a little spot of blood   Pt had an appt yesterday with Dr Wynetta Emery for a cold that he has. Pt states she prescribed 2 medications has not picked up yet, he will be going to get it today. He wants to make sure the that she knows that when he blew his nose today there was a spot of blood. Does he need another medication states pt. [phone]    Attempted to call patient to discuss symptoms- left message to call office

## 2021-06-04 NOTE — Telephone Encounter (Signed)
If patient calls back.  Let him know that he does not need another medication at this time.  Contact office schedule appointment if anything worsens.

## 2021-06-04 NOTE — Telephone Encounter (Signed)
2nd call placed to review symptoms of blowing nose and seeing blood. No answer, LVMTCB [phone].

## 2021-06-04 NOTE — Telephone Encounter (Signed)
Third attempt to contact patient. No answer- Left message to call office.

## 2021-06-10 ENCOUNTER — Encounter: Payer: Self-pay | Admitting: Nurse Practitioner

## 2021-06-10 ENCOUNTER — Other Ambulatory Visit: Payer: Self-pay

## 2021-06-10 ENCOUNTER — Ambulatory Visit: Payer: Self-pay | Admitting: *Deleted

## 2021-06-10 ENCOUNTER — Ambulatory Visit (INDEPENDENT_AMBULATORY_CARE_PROVIDER_SITE_OTHER): Payer: Medicare Other | Admitting: Nurse Practitioner

## 2021-06-10 DIAGNOSIS — J019 Acute sinusitis, unspecified: Secondary | ICD-10-CM

## 2021-06-10 MED ORDER — AMOXICILLIN 875 MG PO TABS: 875.0000 mg | ORAL_TABLET | Freq: Two times a day (BID) | ORAL | 0 refills | Status: AC

## 2021-06-10 NOTE — Telephone Encounter (Signed)
Returned pt call. Pt states when he blow his nose there is dark green mucous. Pt has been scheduled a virtual appt with Tonya for 2/13 at 240pm

## 2021-06-10 NOTE — Telephone Encounter (Signed)
Pt. Reports he feels "worse than I did when I saw Dr. Wynetta Emery last week." Has green, bloody drainage from nose and chills. Asking for antibiotic. Declined visit. Please advise pt. Answer Assessment - Initial Assessment Questions 1. LOCATION: "Where does it hurt?"      Face 2. ONSET: "When did the sinus pain start?"  (e.g., hours, days)      Last week 3. SEVERITY: "How bad is the pain?"   (Scale 1-10; mild, moderate or severe)   - MILD (1-3): doesn't interfere with normal activities    - MODERATE (4-7): interferes with normal activities (e.g., work or school) or awakens from sleep   - SEVERE (8-10): excruciating pain and patient unable to do any normal activities        Mild 4. RECURRENT SYMPTOM: "Have you ever had sinus problems before?" If Yes, ask: "When was the last time?" and "What happened that time?"      Yes 5. NASAL CONGESTION: "Is the nose blocked?" If Yes, ask: "Can you open it or must you breathe through your mouth?"     No 6. NASAL DISCHARGE: "Do you have discharge from your nose?" If so ask, "What color?"     Green and bloody 7. FEVER: "Do you have a fever?" If Yes, ask: "What is it, how was it measured, and when did it start?"      No 8. OTHER SYMPTOMS: "Do you have any other symptoms?" (e.g., sore throat, cough, earache, difficulty breathing)     Chills 9. PREGNANCY: "Is there any chance you are pregnant?" "When was your last menstrual period?"     N/a  Protocols used: Sinus Pain or Congestion-A-AH

## 2021-06-10 NOTE — Telephone Encounter (Signed)
Summary: cold symptoms, blood when he blows his nose   Pt called reporting cold symptoms; he says he sees a bit of blood when he blows his nose. Please advise        Chief Complaint: cold sx . Blowing nose blood noted. Symptoms: cough nasal drainage , yellow and green at times. Blew nose for blood x 3 days ago . Drainage thick left side soreness  Frequency: na Pertinent Negatives: Patient denies chest pain , difficulty breathing, unable to check temp. Denies left side N/T  Disposition: [] ED /[] Urgent Care (no appt availability in office) / [x] Appointment(In office/virtual)/ []  [facility]/ [] Home Care/ [] Refused Recommended Disposition /[] [facility]/ []  Follow-up with PCP Additional Notes:   Telephone appt scheduled for 06/09/21 today .     Reason for Disposition  [1] Sinus congestion (pressure, fullness) AND [2] present > 10 days    Not greater than 10 days no specific time given.  Answer Assessment - Initial Assessment Questions 1. LOCATION: "Where does it hurt?"      Na  2. ONSET: "When did the sinus pain start?"  (e.g., hours, days)      Greater than 3 days would not give a specific day  3. SEVERITY: "How bad is the pain?"   (Scale 1-10; mild, moderate or severe)   - MILD (1-3): doesn't interfere with normal activities    - MODERATE (4-7): interferes with normal activities (e.g., work or school) or awakens from sleep   - SEVERE (8-10): excruciating pain and patient unable to do any normal activities        Na  4. RECURRENT SYMPTOM: "Have you ever had sinus problems before?" If Yes, ask: "When was the last time?" and "What happened that time?"      Yes  5. NASAL CONGESTION: "Is the nose blocked?" If Yes, ask: "Can you open it or must you breathe through your mouth?"     Blocked at times blowing nose for yellow green drainage, coughing yellow sputum, left side soreness. Blowing nose for blood at times  6. NASAL DISCHARGE: "Do you have discharge from your  nose?" If so ask, "What color?"     Yes yellow green  7. FEVER: "Do you have a fever?" If Yes, ask: "What is it, how was it measured, and when did it start?"      C/o chills no temp reported  8. OTHER SYMPTOMS: "Do you have any other symptoms?" (e.g., sore throat, cough, earache, difficulty breathing)     Cough , chills, runny nose  9. PREGNANCY: "Is there any chance you are pregnant?" "When was your last menstrual period?"     na  Protocols used: Sinus Pain or Congestion-A-AH

## 2021-06-10 NOTE — Patient Instructions (Addendum)
Sinusitis:  Stay well hydrated  Stay active  Deep breathing exercises  May take tylenol or fever or pain  May take mucinex DM twice daily  Will order amoxicillin    Follow up:  Follow up if needed

## 2021-06-10 NOTE — Progress Notes (Signed)
Virtual Visit via Telephone Note  I connected with [employer] [place] on 06/10/21 at  2:40 PM EST by telephone and verified that I am speaking with the correct person using two identifiers.  Location: Patient: home Provider: office   I discussed the limitations, risks, security and privacy concerns of performing an evaluation and management service by telephone and the availability of in person appointments. I also discussed with the patient that there may be a patient responsible charge related to this service. The patient expressed understanding and agreed to proceed.   History of Present Illness:  Patient presents today for sick visit through telephone visit.  Patient states that he has been having chills, thick yellow sinus drainage, productive cough x2 weeks.  Patient states that his sinus drainage has been bloody at times.  Patient does report significant sinus pain and pressure.  He states that he did take a home COVID test that was negative.  He has taken over-the-counter cough syrup with minimal relief noted. Denies f/c/s, n/v/d, hemoptysis, PND, chest pain or edema.    Observations/Objective:  Vitals with BMI 06/03/2021 04/15/2021 03/01/2021  Height - - 5\' 10"   Weight 187 lbs - 185 lbs  BMI - - 23.55  Systolic 732 202 542  Diastolic 94 85 80  Pulse 81 70 56      Assessment and Plan:  Sinusitis:  Stay well hydrated  Stay active  Deep breathing exercises  May take tylenol or fever or pain  May take mucinex DM twice daily  Will order amoxicillin    Follow up:  Follow up if needed    I discussed the assessment and treatment plan with the patient. The patient was provided an opportunity to ask questions and all were answered. The patient agreed with the plan and demonstrated an understanding of the instructions.   The patient was advised to call back or seek an in-person evaluation if the symptoms worsen or if the condition fails to improve as anticipated.  I  provided 23 minutes of non-face-to-face time during this encounter.   Fenton Foy, NP

## 2021-06-10 NOTE — Telephone Encounter (Signed)
Summary: cold symptoms, blood when he blows his nose   Pt called reporting cold symptoms; he says he sees a bit of blood when he blows his nose. Please advise     Attempted to contact patient- no answer. Left message to call office.

## 2021-06-18 ENCOUNTER — Ambulatory Visit: Payer: Medicare Other | Admitting: Pharmacist

## 2021-06-19 DIAGNOSIS — Z79899 Other long term (current) drug therapy: Secondary | ICD-10-CM | POA: Diagnosis not present

## 2021-06-19 DIAGNOSIS — Z0000 Encounter for general adult medical examination without abnormal findings: Secondary | ICD-10-CM | POA: Diagnosis not present

## 2021-06-19 DIAGNOSIS — G8929 Other chronic pain: Secondary | ICD-10-CM | POA: Diagnosis not present

## 2021-06-19 DIAGNOSIS — M545 Low back pain, unspecified: Secondary | ICD-10-CM | POA: Diagnosis not present

## 2021-06-19 DIAGNOSIS — R030 Elevated blood-pressure reading, without diagnosis of hypertension: Secondary | ICD-10-CM | POA: Diagnosis not present

## 2021-06-19 DIAGNOSIS — E119 Type 2 diabetes mellitus without complications: Secondary | ICD-10-CM | POA: Diagnosis not present

## 2021-06-19 DIAGNOSIS — G894 Chronic pain syndrome: Secondary | ICD-10-CM | POA: Diagnosis not present

## 2021-06-20 ENCOUNTER — Encounter: Payer: Self-pay | Admitting: Internal Medicine

## 2021-06-20 DIAGNOSIS — E119 Type 2 diabetes mellitus without complications: Secondary | ICD-10-CM | POA: Diagnosis not present

## 2021-06-20 LAB — HM DIABETES EYE EXAM

## 2021-07-16 DIAGNOSIS — M545 Low back pain, unspecified: Secondary | ICD-10-CM | POA: Diagnosis not present

## 2021-07-16 DIAGNOSIS — E119 Type 2 diabetes mellitus without complications: Secondary | ICD-10-CM | POA: Diagnosis not present

## 2021-07-16 DIAGNOSIS — Z6826 Body mass index (BMI) 26.0-26.9, adult: Secondary | ICD-10-CM | POA: Diagnosis not present

## 2021-07-16 DIAGNOSIS — E559 Vitamin D deficiency, unspecified: Secondary | ICD-10-CM | POA: Diagnosis not present

## 2021-07-16 DIAGNOSIS — Z79899 Other long term (current) drug therapy: Secondary | ICD-10-CM | POA: Diagnosis not present

## 2021-07-16 DIAGNOSIS — G894 Chronic pain syndrome: Secondary | ICD-10-CM | POA: Diagnosis not present

## 2021-07-16 DIAGNOSIS — R030 Elevated blood-pressure reading, without diagnosis of hypertension: Secondary | ICD-10-CM | POA: Diagnosis not present

## 2021-07-23 ENCOUNTER — Telehealth: Payer: Self-pay | Admitting: Internal Medicine

## 2021-07-23 NOTE — Telephone Encounter (Signed)
Pt now has a One Touch Verio Flex meter and he needs and Rx for test strips to go to  ?[facility], [place]. Phone:  [phone]  ?Fax:  [phone]  ?  ?Pt stated he needs this today because he has not checked his sugar since Friday / please call pt when sent  ?

## 2021-07-24 MED ORDER — ONETOUCH VERIO VI STRP: ORAL_STRIP | 3 refills | Status: DC

## 2021-07-25 NOTE — Telephone Encounter (Signed)
Pt called in checking status of one touch test strips. Please call back ?

## 2021-07-26 ENCOUNTER — Other Ambulatory Visit: Payer: Self-pay | Admitting: Internal Medicine

## 2021-07-26 MED ORDER — ONETOUCH VERIO VI STRP: ORAL_STRIP | 3 refills | Status: DC

## 2021-07-26 NOTE — Telephone Encounter (Signed)
Pt called in for assistance. Pharmacy states that they have not received pt's Rx for glucose blood (ONETOUCH VERIO) test strip . Request that they are sent again.  ? ? ? ?Pharmacy:  ?[facility], [place]. Phone:  [phone]  ?Fax:  [phone]  ?  ? ?

## 2021-07-26 NOTE — Telephone Encounter (Signed)
[facility] called and spoke to Christian [name], Merchant navy officer about the refill(s) One Music therapist [place] requested. Advised it was sent on 07/26/21 #100/3 refill(s). She says it was received. ? ?

## 2021-07-26 NOTE — Telephone Encounter (Signed)
Resending to pharmacy. ? ?Requested Prescriptions  ?Pending Prescriptions Disp Refills  ?? glucose blood (ONETOUCH VERIO) test strip 100 each 3  ?  Sig: Use as directed 3 times daily E11.9  ?  ? Endocrinology: Diabetes - Testing Supplies Passed - 07/26/2021  4:37 PM  ?  ?  Passed - Valid encounter within last 12 months  ?  Recent Outpatient Visits   ?      ? 1 month ago Acute non-recurrent sinusitis, unspecified location  ? Primary Care at [facility], Kriste Basque, NP  ? 1 month ago Diabetic peripheral neuropathy associated with type 2 diabetes mellitus ([place])  ? [place] Karle Plumber B, MD  ? 3 months ago Need for zoster vaccination  ? [place], RPH-CPP  ? 5 months ago Hypertension associated with diabetes ([place])  ? [place], RPH-CPP  ? 5 months ago Type 2 diabetes mellitus without complication, without long-term current use of insulin ([place])  ? [place] Ladell Pier, MD  ?  ?  ?Future Appointments   ?        ? In 2 months Ladell Pier, MD [place]  ?  ? ?  ?  ?  ? ? ?

## 2021-07-29 NOTE — Telephone Encounter (Signed)
Called pt unable to reach or leave VM. ?

## 2021-08-02 NOTE — Telephone Encounter (Signed)
2X called pt left VM RX was sent to pharmacy,call back number provided for amy additional questions or concerns ?

## 2021-08-14 ENCOUNTER — Telehealth: Payer: Self-pay | Admitting: Internal Medicine

## 2021-08-14 NOTE — Telephone Encounter (Unsigned)
Copied from [place] [id number]. Topic: General - Other ?>> Aug 14, 2021 12:29 PM Leward Quan A wrote: ?Reason for CRM: Rai with [facility] called in about an Rx request sent on 07/20/21 and faxed again today to Dr Wynetta Emery to complete and fax back ASAP. Please call with questions and concerns or to say if fax was received Ph# [phone] ?

## 2021-08-14 NOTE — Telephone Encounter (Signed)
Copied from [place] [id number]. Topic: General - Other ?>> Aug 14, 2021 12:29 PM Leward Quan A wrote: ?Reason for CRM: Rai with [facility] called in about an Rx request sent on 07/20/21 and faxed again today to Dr Wynetta Emery to complete and fax back ASAP. Please call with questions and concerns or to say if fax was received Ph# [phone] ?

## 2021-08-15 NOTE — Telephone Encounter (Signed)
Returned call to [facility] and spoke to a rep that will be refaxing form  ?

## 2021-08-16 ENCOUNTER — Other Ambulatory Visit: Payer: Self-pay | Admitting: Internal Medicine

## 2021-08-16 DIAGNOSIS — R0981 Nasal congestion: Secondary | ICD-10-CM

## 2021-08-16 MED ORDER — CETIRIZINE HCL 10 MG PO TABS: 10.0000 mg | ORAL_TABLET | Freq: Every day | ORAL | 2 refills | Status: DC

## 2021-08-16 NOTE — Telephone Encounter (Signed)
Requested Prescriptions  ?Pending Prescriptions Disp Refills  ?? cetirizine (ZYRTEC) 10 MG tablet 90 tablet 1  ?  Sig: Take 1 tablet (10 mg total) by mouth daily.  ?  ? Ear, Nose, and Throat:  Antihistamines 2 Failed - 08/16/2021 12:15 PM  ?  ?  Failed - Cr in normal range and within 360 days  ?  Creat  ?Date Value Ref Range Status  ?01/14/2016 1.09 0.70 - 1.33 mg/dL Final  ?  Comment:  ?    ?For patients > or = 62 years of age: The upper reference limit for ?Creatinine is approximately 13% higher for people identified as ?African-American. ?  ?  ? ?Creatinine, Ser  ?Date Value Ref Range Status  ?01/29/2021 1.42 (H) 0.76 - 1.27 mg/dL Final  ? ?Creatinine, Urine  ?Date Value Ref Range Status  ?04/02/2016 164 20 - 370 mg/dL Final  ?   ?  ?  Passed - Valid encounter within last 12 months  ?  Recent Outpatient Visits   ?      ? 2 months ago Acute non-recurrent sinusitis, unspecified location  ? [facility] Care at [facility], Kriste Basque, NP  ? 2 months ago Diabetic peripheral neuropathy associated with type 2 diabetes mellitus ([place])  ? [place] Karle Plumber B, MD  ? 4 months ago Need for zoster vaccination  ? [place], RPH-CPP  ? 6 months ago Hypertension associated with diabetes ([place])  ? [place], RPH-CPP  ? 6 months ago Type 2 diabetes mellitus without complication, without long-term current use of insulin ([place])  ? [place] Ladell Pier, MD  ?  ?  ?Future Appointments   ?        ? In 1 month Ladell Pier, MD [place]  ?  ? ?  ?  ?  ? ? ?

## 2021-08-16 NOTE — Telephone Encounter (Signed)
Medication Refill - Medication:  ?cetirizine (ZYRTEC) 10 MG tablet  ? ?Has the patient contacted their pharmacy? Yes.   ?Contact PCP ? ?Preferred Pharmacy (with phone number or street name):  ?[id number]  [phone] ?Fax:  [phone] ?  ? ?Has the patient been seen for an appointment in the last year OR does the patient have an upcoming appointment? Yes.   ? ?Agent: Please be advised that RX refills may take up to 3 business days. We ask that you follow-up with your pharmacy. ?

## 2021-08-22 DIAGNOSIS — E119 Type 2 diabetes mellitus without complications: Secondary | ICD-10-CM | POA: Diagnosis not present

## 2021-08-22 DIAGNOSIS — G8929 Other chronic pain: Secondary | ICD-10-CM | POA: Diagnosis not present

## 2021-08-22 DIAGNOSIS — R600 Localized edema: Secondary | ICD-10-CM | POA: Diagnosis not present

## 2021-08-22 DIAGNOSIS — E663 Overweight: Secondary | ICD-10-CM | POA: Diagnosis not present

## 2021-08-22 DIAGNOSIS — Z0000 Encounter for general adult medical examination without abnormal findings: Secondary | ICD-10-CM | POA: Diagnosis not present

## 2021-08-22 DIAGNOSIS — E785 Hyperlipidemia, unspecified: Secondary | ICD-10-CM | POA: Diagnosis not present

## 2021-08-22 DIAGNOSIS — Z79899 Other long term (current) drug therapy: Secondary | ICD-10-CM | POA: Diagnosis not present

## 2021-08-22 DIAGNOSIS — J309 Allergic rhinitis, unspecified: Secondary | ICD-10-CM | POA: Diagnosis not present

## 2021-08-22 DIAGNOSIS — G4733 Obstructive sleep apnea (adult) (pediatric): Secondary | ICD-10-CM | POA: Diagnosis not present

## 2021-08-22 DIAGNOSIS — J8283 Eosinophilic asthma: Secondary | ICD-10-CM | POA: Diagnosis not present

## 2021-08-22 DIAGNOSIS — I10 Essential (primary) hypertension: Secondary | ICD-10-CM | POA: Diagnosis not present

## 2021-08-22 DIAGNOSIS — M545 Low back pain, unspecified: Secondary | ICD-10-CM | POA: Diagnosis not present

## 2021-09-11 ENCOUNTER — Encounter: Payer: Self-pay | Admitting: Internal Medicine

## 2021-09-11 NOTE — Progress Notes (Signed)
I received lab results from NP Jeanella Anton.  Labs done 07/16/2021.  Chemistry including LFTs normal with creatinine 1.4 and GFR 60. ?Vitamin D level 72 ?CBC normal with H/H14/42. ?

## 2021-10-01 ENCOUNTER — Ambulatory Visit: Payer: Medicare Other | Admitting: Internal Medicine

## 2021-10-08 ENCOUNTER — Telehealth: Payer: Self-pay | Admitting: Pulmonary Disease

## 2021-10-08 DIAGNOSIS — J4541 Moderate persistent asthma with (acute) exacerbation: Secondary | ICD-10-CM

## 2021-10-08 DIAGNOSIS — J8283 Eosinophilic asthma: Secondary | ICD-10-CM

## 2021-10-10 MED ORDER — IPRATROPIUM-ALBUTEROL 0.5-2.5 (3) MG/3ML IN SOLN: 3.0000 mL | RESPIRATORY_TRACT | 1 refills | Status: DC | PRN

## 2021-10-10 MED ORDER — ALBUTEROL SULFATE HFA 108 (90 BASE) MCG/ACT IN AERS: INHALATION_SPRAY | RESPIRATORY_TRACT | 4 refills | Status: DC

## 2021-10-10 NOTE — Telephone Encounter (Signed)
Irineo Axon and verified pharmacy to sed medication refills into. Verified medications for patient. Medications sent in. Nothing further needed

## 2021-10-18 ENCOUNTER — Telehealth: Payer: Self-pay | Admitting: Pulmonary Disease

## 2021-11-22 ENCOUNTER — Other Ambulatory Visit: Payer: Self-pay | Admitting: Pulmonary Disease

## 2021-11-22 DIAGNOSIS — J4541 Moderate persistent asthma with (acute) exacerbation: Secondary | ICD-10-CM

## 2021-11-25 ENCOUNTER — Telehealth: Payer: Self-pay | Admitting: Pulmonary Disease

## 2021-11-25 DIAGNOSIS — J4541 Moderate persistent asthma with (acute) exacerbation: Secondary | ICD-10-CM

## 2021-11-25 NOTE — Telephone Encounter (Signed)
Patient is calling stating they need refills on Nucala.   Please advise

## 2021-11-26 MED ORDER — NUCALA 100 MG/ML ~~LOC~~ SOAJ: 100.0000 mg | SUBCUTANEOUS | 2 refills | Status: DC

## 2021-11-26 NOTE — Telephone Encounter (Signed)
Refill for Nucala sent to [facility] via escribe  Next appt on 12/06/21 with Marland Kitchen  Knox Saliva, PharmD, MPH, BCPS, CPP Clinical Pharmacist (Rheumatology and Pulmonology)

## 2021-12-06 ENCOUNTER — Encounter: Payer: Self-pay | Admitting: Nurse Practitioner

## 2021-12-06 ENCOUNTER — Ambulatory Visit (INDEPENDENT_AMBULATORY_CARE_PROVIDER_SITE_OTHER): Payer: Medicare HMO | Admitting: Nurse Practitioner

## 2021-12-06 VITALS — BP 136/82 | HR 72 | Temp 98.5°F | Ht 70.0 in | Wt 191.4 lb

## 2021-12-06 DIAGNOSIS — Z9989 Dependence on other enabling machines and devices: Secondary | ICD-10-CM

## 2021-12-06 DIAGNOSIS — G4733 Obstructive sleep apnea (adult) (pediatric): Secondary | ICD-10-CM | POA: Diagnosis not present

## 2021-12-06 DIAGNOSIS — J8283 Eosinophilic asthma: Secondary | ICD-10-CM | POA: Diagnosis not present

## 2021-12-06 DIAGNOSIS — J3089 Other allergic rhinitis: Secondary | ICD-10-CM

## 2021-12-06 NOTE — Assessment & Plan Note (Signed)
Stable. Continue Zyrtec and singulair for trigger prevention. Continue flonase.

## 2021-12-06 NOTE — Progress Notes (Signed)
_0  ID: Christian [name], male    DOB: Aug 29, 1960, 61 y.o.   MRN: [id number]  Chief Complaint  Patient presents with   Follow-up    Pt states his meds are doing good.     Referring provider: Ladell Pier, MD  HPI: 61 year old male, former remote smoker followed for eosinophilic asthma on Nucala and OSA intolerant of CPAP. He is a patient of Dr. Judson Roch and last seen in office on 03/01/2021. Past medical history significant for HTN, HLD, allergic rhinitis, DM, OA.   TEST/EVENTS:  05/25/2019 PFTs: FVC 86, FEV1 75, ratio 74, TLC 85, DLCOunc 89. Positive BD 09/29/2019 CXR 2 view: lungs clear  03/01/2021: OV with Dr. Ander Slade. Encouraged to restart CPAP; plans to try different mask. Asthma stable. Continue Nucala and Breztri.   12/06/2021: Today - follow up Patient presents today for follow up. He reports that he has been doing well. His breathing is stable and he has not required any antibiotics or prednisone in over a year. Occasionally has some chest tightness but this resolves with duoneb, which he uses a few times a week. Continues on [place] injections. He denies any cough, wheezing, orthopnea, leg swelling.   He is not wearing his CPAP currently. He doesn't like the full face mask he has and would like to see about switching. He denies morning headaches or drowsy driving. Snores at night. Denies witness apneas.  Allergies  Allergen Reactions   Phenergan [Promethazine Hcl] Itching and Other (See Comments)    burning    Immunization History  Administered Date(s) Administered   Influenza Inj Mdck Quad Pf 05/29/2018   Influenza Split 02/23/2016   Influenza,inj,Quad PF,6+ Mos 12/22/2016, 02/10/2019, 01/29/2021   Influenza-Unspecified 02/07/2019   PFIZER(Purple Top)SARS-COV-2 Vaccination 07/14/2019, 08/10/2019   Pneumococcal Conjugate-13 03/25/2016   Pneumococcal Polysaccharide-23 07/16/2016   Tdap 04/02/2016   Zoster Recombinat (Shingrix) 02/13/2021,  04/15/2021    Past Medical History:  Diagnosis Date   Allergy    Arthritis    "left knee" (01/22/2016)   Asthma    Chronic lower back pain    COPD (chronic obstructive pulmonary disease) ([place])    Dyspnea    with extertion   High cholesterol    Hypertension    Neuropathy    OSA on CPAP    Secondhand smoke exposure    Sleep apnea    wears CPAP, not wearing now  for months   Type II diabetes mellitus ([place]) dx'd 01/17/2016    Tobacco History: Social History   Tobacco Use  Smoking Status Never   Passive exposure: Yes  Smokeless Tobacco Never  Tobacco Comments   passive cig smoking at work  1994- 2012   Counseling given: Not Answered Tobacco comments: passive cig smoking at work  1994- 2012   Outpatient Medications Prior to Visit  Medication Sig Dispense Refill   albuterol (VENTOLIN HFA) 108 (90 Base) MCG/ACT inhaler 1-2 puffs as needed for wheezing 18 g 4   benzonatate (TESSALON) 100 MG capsule Take 1 capsule (100 mg total) by mouth 2 (two) times daily as needed for cough. 60 capsule 0   Blood Glucose Monitoring Suppl (ONE TOUCH ULTRA 2) w/Device KIT Use as directed 3 times daily E11.9 1 each 0   Budeson-Glycopyrrol-Formoterol (BREZTRI AEROSPHERE) 160-9-4.8 MCG/ACT AERO Inhale 2 puffs into the lungs 2 (two) times daily. 10.7 g 10   cetirizine (ZYRTEC) 10 MG tablet Take 1 tablet (10 mg total) by mouth daily. 90 tablet 2  fluticasone (FLONASE) 50 MCG/ACT nasal spray Place 2 sprays into both nostrils daily. 16 g 6   furosemide (LASIX) 20 MG tablet TAKE 1 TABLET BY MOUTH ONCE DAILY FOR  ONE  WEEK  THEN  AS  NEEDED  FOR  SWELLING 30 tablet 3   glucose blood (ONETOUCH VERIO) test strip Use as directed 3 times daily E11.9 100 each 3   hydrochlorothiazide (HYDRODIURIL) 12.5 MG tablet TAKE 1 TABLET BY MOUTH  DAILY 30 tablet 11   HYDROcodone-acetaminophen (NORCO) 7.5-325 MG tablet Take 1 tablet by mouth 3 (three) times daily as needed.     ipratropium-albuterol (DUONEB) 0.5-2.5 (3)  MG/3ML SOLN INHALE 1 VIAL VIA NEBULIZER EVERY 4 HOURS AS NEEDED FOR BREATHING 180 mL 10   lisinopril-hydrochlorothiazide (ZESTORETIC) 10-12.5 MG tablet Take 1 tablet by mouth daily. 30 tablet 11   Mepolizumab (NUCALA) 100 MG/ML SOAJ Inject 1 mL (100 mg total) into the skin every 28 (twenty-eight) days. **patient assistance program** 1 mL 2   metFORMIN (GLUCOPHAGE XR) 500 MG 24 hr tablet Take 1 tablet (500 mg total) by mouth daily with breakfast. 30 tablet 5   montelukast (SINGULAIR) 10 MG tablet Take 1 tablet (10 mg total) by mouth at bedtime. 30 tablet 9   pravastatin (PRAVACHOL) 40 MG tablet Take 1 tablet (40 mg total) by mouth at bedtime. 90 tablet 1   TRUEPLUS LANCETS 28G MISC Check glucose 2 times a day 100 each 3   No facility-administered medications prior to visit.     Review of Systems:   Constitutional: No weight loss or gain, night sweats, fevers, chills, fatigue, or lassitude. HEENT: No headaches, difficulty swallowing, tooth/dental problems, or sore throat. No sneezing, itching, ear ache, nasal congestion, or post nasal drip CV:  No chest pain, orthopnea, PND, swelling in lower extremities, anasarca, dizziness, palpitations, syncope Resp: +occasional chest tightness; shortness of breath with strenuous activity (stable). No excess mucus or change in color of mucus. No productive or non-productive. No hemoptysis. No wheezing.  No chest wall deformity Skin: No rash, lesions, ulcerations MSK:  No joint pain or swelling.  No decreased range of motion.  No back pain. Neuro: No dizziness or lightheadedness.  Psych: No depression or anxiety. Mood stable.     Physical Exam:  BP 136/82 (BP Location: Right Arm, Patient Position: Sitting, Cuff Size: Normal)   Pulse 72   Temp 98.5 F (36.9 C) (Oral)   Ht $R'5\' 10"'Sk$  (1.778 m)   Wt 191 lb 6.4 oz (86.8 kg)   SpO2 99%   BMI 27.46 kg/m   GEN: Pleasant, interactive, well-appearing; in no acute distress. HEENT:  Normocephalic and  atraumatic. PERRLA. Sclera white. Nasal turbinates pink, moist and patent bilaterally. No rhinorrhea present. Oropharynx pink and moist, without exudate or edema. No lesions, ulcerations, or postnasal drip.  NECK:  Supple w/ fair ROM. No JVD present. Normal carotid impulses w/o bruits. Thyroid symmetrical with no goiter or nodules palpated. No lymphadenopathy.   CV: RRR, no m/r/g, no peripheral edema. Pulses intact, +2 bilaterally. No cyanosis, pallor or clubbing. PULMONARY:  Unlabored, regular breathing. Clear bilaterally A&P w/o wheezes/rales/rhonchi. No accessory muscle use. No dullness to percussion. GI: BS present and normoactive. Soft, non-tender to palpation. No organomegaly or masses detected. No CVA tenderness. MSK: No erythema, warmth or tenderness. Cap refil <2 sec all extrem. No deformities or joint swelling noted.  Neuro: A/Ox3. No focal deficits noted.   Skin: Warm, no lesions or rashe Psych: Normal affect and behavior. Judgement and thought  content appropriate.     Lab Results:  CBC    Component Value Date/Time   WBC 3.4 01/29/2021 1109   WBC 13.3 (H) 12/03/2019 0125   RBC 5.02 01/29/2021 1109   RBC 4.80 12/03/2019 0125   HGB 14.9 01/29/2021 1109   HCT 45.0 01/29/2021 1109   PLT 192 01/29/2021 1109   MCV 90 01/29/2021 1109   MCH 29.7 01/29/2021 1109   MCH 29.4 12/03/2019 0125   MCHC 33.1 01/29/2021 1109   MCHC 33.3 12/03/2019 0125   RDW 13.8 01/29/2021 1109   LYMPHSABS 1.7 12/01/2019 1943   LYMPHSABS 2.1 08/19/2018 1441   MONOABS 0.6 12/01/2019 1943   EOSABS 0.7 (H) 12/01/2019 1943   EOSABS 0.5 (H) 08/19/2018 1441   BASOSABS 0.1 12/01/2019 1943   BASOSABS 0.1 08/19/2018 1441    BMET    Component Value Date/Time   NA 143 01/29/2021 1109   K 4.0 01/29/2021 1109   CL 102 01/29/2021 1109   CO2 20 01/29/2021 1109   GLUCOSE 90 01/29/2021 1109   GLUCOSE 124 (H) 12/03/2019 0125   BUN 15 01/29/2021 1109   CREATININE 1.42 (H) 01/29/2021 1109   CREATININE 1.09  01/14/2016 1523   CALCIUM 9.5 01/29/2021 1109   GFRNONAA 76 12/15/2019 1227   GFRNONAA 75 01/14/2016 1523   GFRAA 87 12/15/2019 1227   GFRAA 87 01/14/2016 1523    BNP    Component Value Date/Time   BNP 13.9 12/27/2019 1336     Imaging:  No results found.       Latest Ref Rng & Units 05/25/2019   11:52 AM 04/05/2019    8:57 AM 05/07/2016   11:55 AM  PFT Results  FVC-Pre L  3.22  P 3.11   FVC-Predicted Pre % 86  86  P 82   FVC-Post L 3.33  3.33  P 2.46   FVC-Predicted Post % 89  89  P 64   Pre FEV1/FVC % % 68  68  P 58   Post FEV1/FCV % % 74  74  P 70   FEV1-Pre L 2.20  2.20  P 1.79   FEV1-Predicted Pre % 75  75  P 60   FEV1-Post L 2.46  2.46  P 1.73   DLCO uncorrected ml/min/mmHg 23.44  23.44  P 22.17   DLCO UNC% % 89  89  P 74   DLCO corrected ml/min/mmHg   20.65   DLCO COR %Predicted %   69   DLVA Predicted % 106  106  P 108   TLC L 5.63  5.63  P 5.42   TLC % Predicted % 85  85  P 82   RV % Predicted % 95  95  P 103     P Preliminary result    No results found for: "NITRICOXIDE"      Assessment & Plan:   Eosinophilic asthma Allergic asthma. He has been doing well on [place]. No recent exacerbations. We will continue his current regimen. Refills are available on his maintenance and rescue medications.  Patient Instructions  Continue Breztri 2 puffs Twice daily. Brush tongue and rinse mouth afterwards Continue Albuterol inhaler 2 puffs or 3 mL neb every 6 hours as needed for shortness of breath or wheezing. Notify if symptoms persist despite rescue inhaler/neb use.  Continue flonase nasal spray 2 sprays each nostril daily Continue Zyrtec 1 tab daily Continue singulair 1 tab daily  Restart CPAP therapy nightly, minimum of 4-6 hours a  night  Mask fitting ordered today to find a new mask - someone will contact you for scheduling  Follow up in 3 months with Dr. Ander Slade. If symptoms do not improve or worsen, please contact office for sooner  follow up or seek emergency care.    OSA (obstructive sleep apnea) Noncompliant with use due to mask intolerance. Recommended he go for mask desensitization study - referral placed today. Encouraged him on CPAP use. We discussed how untreated sleep apnea puts an individual at risk for cardiac arrhthymias, pulm HTN, DM, stroke and increases their risk for daytime accidents.   Allergic rhinitis Stable. Continue Zyrtec and singulair for trigger prevention. Continue flonase.    I spent 28 minutes of dedicated to the care of this patient on the date of this encounter to include pre-visit review of records, face-to-face time with the patient discussing conditions above, post visit ordering of testing, clinical documentation with the electronic health record, making appropriate referrals as documented, and communicating necessary findings to members of the patients care team.  Clayton Bibles, NP 12/06/2021  Pt aware and understands NP's role.

## 2021-12-06 NOTE — Assessment & Plan Note (Signed)
Allergic asthma. He has been doing well on [place]. No recent exacerbations. We will continue his current regimen. Refills are available on his maintenance and rescue medications.  Patient Instructions  Continue Breztri 2 puffs Twice daily. Brush tongue and rinse mouth afterwards Continue Albuterol inhaler 2 puffs or 3 mL neb every 6 hours as needed for shortness of breath or wheezing. Notify if symptoms persist despite rescue inhaler/neb use.  Continue flonase nasal spray 2 sprays each nostril daily Continue Zyrtec 1 tab daily Continue singulair 1 tab daily  Restart CPAP therapy nightly, minimum of 4-6 hours a night  Mask fitting ordered today to find a new mask - someone will contact you for scheduling  Follow up in 3 months with Dr. Ander Slade. If symptoms do not improve or worsen, please contact office for sooner follow up or seek emergency care.

## 2021-12-06 NOTE — Assessment & Plan Note (Signed)
Noncompliant with use due to mask intolerance. Recommended he go for mask desensitization study - referral placed today. Encouraged him on CPAP use. We discussed how untreated sleep apnea puts an individual at risk for cardiac arrhthymias, pulm HTN, DM, stroke and increases their risk for daytime accidents.

## 2021-12-06 NOTE — Patient Instructions (Addendum)
Continue Breztri 2 puffs Twice daily. Brush tongue and rinse mouth afterwards Continue Albuterol inhaler 2 puffs or 3 mL neb every 6 hours as needed for shortness of breath or wheezing. Notify if symptoms persist despite rescue inhaler/neb use.  Continue flonase nasal spray 2 sprays each nostril daily Continue Zyrtec 1 tab daily Continue singulair 1 tab daily  Restart CPAP therapy nightly, minimum of 4-6 hours a night  Mask fitting ordered today to find a new mask - someone will contact you for scheduling  Follow up in 3 months with Dr. Ander Slade. If symptoms do not improve or worsen, please contact office for sooner follow up or seek emergency care.

## 2021-12-10 ENCOUNTER — Ambulatory Visit: Payer: Medicare HMO | Admitting: Primary Care

## 2021-12-11 ENCOUNTER — Other Ambulatory Visit: Payer: Self-pay | Admitting: Internal Medicine

## 2021-12-11 DIAGNOSIS — E1142 Type 2 diabetes mellitus with diabetic polyneuropathy: Secondary | ICD-10-CM

## 2021-12-19 ENCOUNTER — Other Ambulatory Visit: Payer: Self-pay | Admitting: Internal Medicine

## 2021-12-19 DIAGNOSIS — E1142 Type 2 diabetes mellitus with diabetic polyneuropathy: Secondary | ICD-10-CM

## 2021-12-25 ENCOUNTER — Telehealth: Payer: Self-pay | Admitting: Nurse Practitioner

## 2021-12-25 MED ORDER — BREZTRI AEROSPHERE 160-9-4.8 MCG/ACT IN AERO: 2.0000 | INHALATION_SPRAY | Freq: Two times a day (BID) | RESPIRATORY_TRACT | 3 refills | Status: DC

## 2021-12-25 MED ORDER — BREZTRI AEROSPHERE 160-9-4.8 MCG/ACT IN AERO: 2.0000 | INHALATION_SPRAY | Freq: Two times a day (BID) | RESPIRATORY_TRACT | 0 refills | Status: DC

## 2021-12-25 NOTE — Telephone Encounter (Signed)
Called and spoke with patient. Patient requested a Breztri 90 days prescription be sent to his mail order pharmacy.  Patient stated he used his last Breztri inhaler last night and requested a sample until his prescription arrives. Breztri prescription sent to requested mail order and Breztri samples left at front desk for patient pick up.  Nothing further at this time.

## 2021-12-31 ENCOUNTER — Ambulatory Visit: Payer: Medicare HMO | Admitting: Nurse Practitioner

## 2022-01-08 ENCOUNTER — Ambulatory Visit (HOSPITAL_BASED_OUTPATIENT_CLINIC_OR_DEPARTMENT_OTHER): Payer: Medicare HMO | Attending: Nurse Practitioner | Admitting: Pulmonary Disease

## 2022-01-08 DIAGNOSIS — G4733 Obstructive sleep apnea (adult) (pediatric): Secondary | ICD-10-CM

## 2022-01-10 ENCOUNTER — Telehealth: Payer: Self-pay | Admitting: *Deleted

## 2022-01-10 NOTE — Telephone Encounter (Signed)
PC returned to pharmacist this afternoon.   He wanted me to know that pt is on Narco 5/325 mg 3x/day and Baclofen 5 mg BID PRN from another provider and RF Gabapentin 09/2021 from a rxn written by PA Freeman Caldron 10/2020.  Pharmacist stated he was flag in system for high risk meds so he recommended that pt get Narcan kit.  He recommended it to pt and pt agreed.  It was dispensed and was told how to use it.  This was an [place] call.

## 2022-01-10 NOTE — Telephone Encounter (Signed)
Pharmacist called to inform Dr. Wynetta Emery that pt is on Gabapentin  from Dr. Wynetta Emery and Lebron Quam 5/325 3xd prn, and Baclofen 5 2xd prn from Dr. Sandi Mariscal. Pharmacist would like a call back to discuss.

## 2022-01-10 NOTE — Telephone Encounter (Signed)
Routing to PCP

## 2022-01-13 ENCOUNTER — Other Ambulatory Visit: Payer: Self-pay | Admitting: Internal Medicine

## 2022-01-13 DIAGNOSIS — E1142 Type 2 diabetes mellitus with diabetic polyneuropathy: Secondary | ICD-10-CM

## 2022-01-14 NOTE — Progress Notes (Signed)
Fitted with Aldean Baker s/m full face mask.

## 2022-01-15 ENCOUNTER — Other Ambulatory Visit: Payer: Self-pay

## 2022-01-15 ENCOUNTER — Emergency Department (HOSPITAL_COMMUNITY): Payer: Medicare HMO

## 2022-01-15 ENCOUNTER — Inpatient Hospital Stay (HOSPITAL_COMMUNITY)
Admission: EM | Admit: 2022-01-15 | Discharge: 2022-01-19 | DRG: 202 | Disposition: A | Discharge status: Home / Self Care | Payer: Medicare HMO | Attending: Internal Medicine | Admitting: Internal Medicine

## 2022-01-15 DIAGNOSIS — J4552 Severe persistent asthma with status asthmaticus: Secondary | ICD-10-CM | POA: Diagnosis present

## 2022-01-15 DIAGNOSIS — E785 Hyperlipidemia, unspecified: Secondary | ICD-10-CM | POA: Diagnosis present

## 2022-01-15 DIAGNOSIS — Z8249 Family history of ischemic heart disease and other diseases of the circulatory system: Secondary | ICD-10-CM

## 2022-01-15 DIAGNOSIS — J9601 Acute respiratory failure with hypoxia: Secondary | ICD-10-CM | POA: Diagnosis present

## 2022-01-15 DIAGNOSIS — R519 Headache, unspecified: Secondary | ICD-10-CM | POA: Diagnosis present

## 2022-01-15 DIAGNOSIS — Z96652 Presence of left artificial knee joint: Secondary | ICD-10-CM | POA: Diagnosis present

## 2022-01-15 DIAGNOSIS — J8283 Eosinophilic asthma: Secondary | ICD-10-CM | POA: Diagnosis present

## 2022-01-15 DIAGNOSIS — G894 Chronic pain syndrome: Secondary | ICD-10-CM | POA: Diagnosis present

## 2022-01-15 DIAGNOSIS — I10 Essential (primary) hypertension: Secondary | ICD-10-CM | POA: Diagnosis present

## 2022-01-15 DIAGNOSIS — G4733 Obstructive sleep apnea (adult) (pediatric): Secondary | ICD-10-CM | POA: Diagnosis present

## 2022-01-15 DIAGNOSIS — E7800 Pure hypercholesterolemia, unspecified: Secondary | ICD-10-CM | POA: Diagnosis present

## 2022-01-15 DIAGNOSIS — Z20822 Contact with and (suspected) exposure to covid-19: Secondary | ICD-10-CM | POA: Diagnosis present

## 2022-01-15 DIAGNOSIS — Z7951 Long term (current) use of inhaled steroids: Secondary | ICD-10-CM | POA: Diagnosis not present

## 2022-01-15 DIAGNOSIS — J4551 Severe persistent asthma with (acute) exacerbation: Secondary | ICD-10-CM | POA: Diagnosis present

## 2022-01-15 DIAGNOSIS — Z981 Arthrodesis status: Secondary | ICD-10-CM

## 2022-01-15 DIAGNOSIS — Z7984 Long term (current) use of oral hypoglycemic drugs: Secondary | ICD-10-CM | POA: Diagnosis not present

## 2022-01-15 DIAGNOSIS — R0981 Nasal congestion: Secondary | ICD-10-CM

## 2022-01-15 DIAGNOSIS — E1140 Type 2 diabetes mellitus with diabetic neuropathy, unspecified: Secondary | ICD-10-CM | POA: Diagnosis present

## 2022-01-15 DIAGNOSIS — Z79899 Other long term (current) drug therapy: Secondary | ICD-10-CM | POA: Diagnosis not present

## 2022-01-15 DIAGNOSIS — Z888 Allergy status to other drugs, medicaments and biological substances status: Secondary | ICD-10-CM | POA: Diagnosis not present

## 2022-01-15 DIAGNOSIS — R0602 Shortness of breath: Principal | ICD-10-CM

## 2022-01-15 DIAGNOSIS — T380X5A Adverse effect of glucocorticoids and synthetic analogues, initial encounter: Secondary | ICD-10-CM | POA: Diagnosis present

## 2022-01-15 LAB — BASIC METABOLIC PANEL
Anion gap: 9 (ref 5–15)
BUN: 11 mg/dL (ref 8–23)
CO2: 24 mmol/L (ref 22–32)
Calcium: 9.6 mg/dL (ref 8.9–10.3)
Chloride: 106 mmol/L (ref 98–111)
Creatinine, Ser: 1.39 mg/dL — ABNORMAL HIGH (ref 0.61–1.24)
GFR, Estimated: 57 mL/min — ABNORMAL LOW (ref >60)
Glucose, Bld: 143 mg/dL — ABNORMAL HIGH (ref 70–99)
Potassium: 3.4 mmol/L — ABNORMAL LOW (ref 3.5–5.1)
Sodium: 139 mmol/L (ref 135–145)

## 2022-01-15 LAB — CBC WITH DIFFERENTIAL/PLATELET
Abs Immature Granulocytes: 0.04 10*3/uL (ref 0.00–0.07)
Basophils Absolute: 0 10*3/uL (ref 0.0–0.1)
Basophils Relative: 0 %
Eosinophils Absolute: 0 10*3/uL (ref 0.0–0.5)
Eosinophils Relative: 0 %
HCT: 40.5 % (ref 39.0–52.0)
Hemoglobin: 13.7 g/dL (ref 13.0–17.0)
Immature Granulocytes: 1 %
Lymphocytes Relative: 13 %
Lymphs Abs: 0.8 10*3/uL (ref 0.7–4.0)
MCH: 29.9 pg (ref 26.0–34.0)
MCHC: 33.8 g/dL (ref 30.0–36.0)
MCV: 88.4 fL (ref 80.0–100.0)
Monocytes Absolute: 0.4 10*3/uL (ref 0.1–1.0)
Monocytes Relative: 7 %
Neutro Abs: 4.7 10*3/uL (ref 1.7–7.7)
Neutrophils Relative %: 79 %
Platelets: 192 10*3/uL (ref 150–400)
RBC: 4.58 MIL/uL (ref 4.22–5.81)
RDW: 14.1 % (ref 11.5–15.5)
WBC: 5.9 10*3/uL (ref 4.0–10.5)
nRBC: 0 % (ref 0.0–0.2)

## 2022-01-15 LAB — RESP PANEL BY RT-PCR (FLU A&B, COVID) ARPGX2
Influenza A by PCR: NEGATIVE
Influenza B by PCR: NEGATIVE
SARS Coronavirus 2 by RT PCR: NEGATIVE

## 2022-01-15 LAB — I-STAT VENOUS BLOOD GAS, ED
Acid-base deficit: 1 mmol/L (ref 0.0–2.0)
Bicarbonate: 25.6 mmol/L (ref 20.0–28.0)
Calcium, Ion: 1.25 mmol/L (ref 1.15–1.40)
HCT: 45 % (ref 39.0–52.0)
Hemoglobin: 15.3 g/dL (ref 13.0–17.0)
O2 Saturation: 83 %
Potassium: 3.9 mmol/L (ref 3.5–5.1)
Sodium: 141 mmol/L (ref 135–145)
TCO2: 27 mmol/L (ref 22–32)
pCO2, Ven: 47.2 mmHg (ref 44–60)
pH, Ven: 7.343 (ref 7.25–7.43)
pO2, Ven: 50 mmHg — ABNORMAL HIGH (ref 32–45)

## 2022-01-15 MED ORDER — ALBUTEROL SULFATE (2.5 MG/3ML) 0.083% IN NEBU
10.0000 mg | INHALATION_SOLUTION | Freq: Once | RESPIRATORY_TRACT | Status: AC
  Administered 2022-01-15: 10 mg via RESPIRATORY_TRACT

## 2022-01-15 MED ORDER — IPRATROPIUM-ALBUTEROL 0.5-2.5 (3) MG/3ML IN SOLN
3.0000 mL | RESPIRATORY_TRACT | Status: DC
  Administered 2022-01-15 – 2022-01-16 (×4): 3 mL via RESPIRATORY_TRACT
  Filled 2022-01-15 (×4): qty 3

## 2022-01-15 MED ORDER — METHYLPREDNISOLONE SODIUM SUCC 125 MG IJ SOLR
125.0000 mg | Freq: Once | INTRAMUSCULAR | Status: AC
  Administered 2022-01-15: 125 mg via INTRAVENOUS
  Filled 2022-01-15: qty 2

## 2022-01-15 MED ORDER — ALBUTEROL (5 MG/ML) CONTINUOUS INHALATION SOLN
20.0000 mg/h | INHALATION_SOLUTION | Freq: Once | RESPIRATORY_TRACT | Status: AC
  Administered 2022-01-15: 20 mg/h via RESPIRATORY_TRACT
  Filled 2022-01-15: qty 0.5

## 2022-01-15 MED ORDER — METHYLPREDNISOLONE SODIUM SUCC 125 MG IJ SOLR: 60.0000 mg | Freq: Every day | INTRAMUSCULAR | Status: DC

## 2022-01-15 MED ORDER — MAGNESIUM SULFATE 2 GM/50ML IV SOLN
2.0000 g | Freq: Once | INTRAVENOUS | Status: AC
  Administered 2022-01-15: 2 g via INTRAVENOUS
  Filled 2022-01-15: qty 50

## 2022-01-15 MED ORDER — ALBUTEROL SULFATE (2.5 MG/3ML) 0.083% IN NEBU
2.5000 mg | INHALATION_SOLUTION | RESPIRATORY_TRACT | Status: DC | PRN
  Administered 2022-01-18: 2.5 mg via RESPIRATORY_TRACT
  Filled 2022-01-15 (×2): qty 3

## 2022-01-15 MED ORDER — ALBUTEROL SULFATE (2.5 MG/3ML) 0.083% IN NEBU
INHALATION_SOLUTION | RESPIRATORY_TRACT | Status: AC
  Filled 2022-01-15: qty 24

## 2022-01-15 MED ORDER — IPRATROPIUM BROMIDE 0.02 % IN SOLN
0.5000 mg | Freq: Once | RESPIRATORY_TRACT | Status: AC
  Administered 2022-01-15: 0.5 mg via RESPIRATORY_TRACT
  Filled 2022-01-15: qty 2.5

## 2022-01-15 MED ORDER — ENOXAPARIN SODIUM 40 MG/0.4ML IJ SOSY
40.0000 mg | PREFILLED_SYRINGE | INTRAMUSCULAR | Status: DC
  Administered 2022-01-15 – 2022-01-18 (×4): 40 mg via SUBCUTANEOUS
  Filled 2022-01-15 (×4): qty 0.4

## 2022-01-15 MED ORDER — IPRATROPIUM-ALBUTEROL 0.5-2.5 (3) MG/3ML IN SOLN
RESPIRATORY_TRACT | Status: AC
  Filled 2022-01-15: qty 12

## 2022-01-15 NOTE — Assessment & Plan Note (Signed)
- 

## 2022-01-15 NOTE — Assessment & Plan Note (Signed)
Continue home antihypertensives 

## 2022-01-15 NOTE — H&P (Signed)
History and Physical    Patient: Christian [name] XYO:[id number] DOB: 1961-02-12 DOA: 01/15/2022 DOS: the patient was seen and examined on 01/15/2022 PCP: Ladell Pier, MD  Patient coming from: Home  Chief Complaint:  Chief Complaint  Patient presents with   Chest Pain   Shortness of Breath   Headache   HPI: Christian [name] is a 61 y.o. male with medical history significant of eosinophilic asthma on Nucala, allergic rhinitis, OSA on CPAP, hypertension, hyperlipidemia, type 2 diabetes,chronic pain who presents with increasing shortness of breath.  Has been having increasing shortness of breath with cough for 3 days. Temperature actuating up to 100F at times. Feels midsternal chest pain with radiation to his back. No nausea, vomiting. No LE edema. Has been taking his inhalers. No tobacco use. Recently started CPAP 5 days ago. Last hospitalized for asthma last year.   In the ED, he was afebrile mildly tachycardic and tachypneic and had oxygen desaturation down to 83%.  Initially placed on 2 L but continued to be dyspneic despite DuoNeb, continuous IV albuterol, IV Solu-Medrol and IV magnesium.  Ultimately had to be placed on BiPAP.  VBG without acidosis or hypercapnia. Chest x-ray negative.  Negative flu and COVID PCR.  Review of Systems: As mentioned in the history of present illness. All other systems reviewed and are negative. Past Medical History:  Diagnosis Date   Allergy    Arthritis    "left knee" (01/22/2016)   Asthma    Chronic lower back pain    COPD (chronic obstructive pulmonary disease) (HCC)    Dyspnea    with extertion   High cholesterol    Hypertension    Neuropathy    OSA on CPAP    Secondhand smoke exposure    Sleep apnea    wears CPAP, not wearing now  for months   Type II diabetes mellitus ([place]) dx'd 01/17/2016   Past Surgical History:  Procedure Laterality Date   ANTERIOR CERVICAL DECOMP/DISCECTOMY FUSION  ~ 1995   "titanium"   APPENDECTOMY  ~ [place]     x 2  L 4disectomy   COLONOSCOPY W/ POLYPECTOMY     INGUINAL HERNIA REPAIR Right ~ [place]  ~ 2000; ~2006   NASAL SINUS SURGERY  ~ 2007   TOTAL KNEE ARTHROPLASTY Left 08/06/2017   Procedure: LEFT TOTAL KNEE ARTHROPLASTY;  Surgeon: Leandrew Koyanagi, MD;  Location: [facility];  Service: Orthopedics;  Laterality: Left;   Social History:  reports that he has never smoked. He has been exposed to tobacco smoke. He has never used smokeless tobacco. He reports that he does not drink alcohol and does not use drugs. On disability.  Allergies  Allergen Reactions   Phenergan [Promethazine Hcl] Itching and Other (See Comments)    burning    Family History  Problem Relation Age of Onset   Hypertension Mother    Hypertension Father    Heart Problems Brother        Heart transplant, pt does not know cause    Rectal cancer Brother    Colon cancer Neg Hx    Esophageal cancer Neg Hx    Stomach cancer Neg Hx     Prior to Admission medications   Medication Sig Start Date End Date Taking? Authorizing Provider  albuterol (VENTOLIN HFA) 108 (90 Base) MCG/ACT inhaler 1-2 puffs as needed for wheezing 10/10/21   Laurin Coder, MD  benzonatate (TESSALON) 100  MG capsule Take 1 capsule (100 mg total) by mouth 2 (two) times daily as needed for cough. 06/03/21   Ladell Pier, MD  Blood Glucose Monitoring Suppl (ONE TOUCH ULTRA 2) w/Device KIT Use as directed 3 times daily E11.9 03/31/17   Ladell Pier, MD  Budeson-Glycopyrrol-Formoterol (BREZTRI AEROSPHERE) 160-9-4.8 MCG/ACT AERO Inhale 2 puffs into the lungs 2 (two) times daily. 05/02/21   Olalere, Ernesto Rutherford, MD  Budeson-Glycopyrrol-Formoterol (BREZTRI AEROSPHERE) 160-9-4.8 MCG/ACT AERO Inhale 2 puffs into the lungs in the morning and at bedtime. 12/25/21   Olalere, Ernesto Rutherford, MD  Budeson-Glycopyrrol-Formoterol (BREZTRI AEROSPHERE) 160-9-4.8 MCG/ACT AERO Inhale 2 puffs into the lungs in the morning and at bedtime. 12/25/21    Laurin Coder, MD  cetirizine (ZYRTEC) 10 MG tablet Take 1 tablet (10 mg total) by mouth daily. 08/16/21   Ladell Pier, MD  fluticasone (FLONASE) 50 MCG/ACT nasal spray Place 2 sprays into both nostrils daily. 04/24/20   Mayers, Cari S, PA-C  furosemide (LASIX) 20 MG tablet TAKE 1 TABLET BY MOUTH ONCE DAILY FOR  ONE  WEEK  THEN  AS  NEEDED  FOR  SWELLING 04/15/21   Ladell Pier, MD  glucose blood (ONETOUCH VERIO) test strip USE TO TEST BLOOD SUGAR AS DIRECTED THREE TIMES A DAY 12/20/21   Ladell Pier, MD  hydrochlorothiazide (HYDRODIURIL) 12.5 MG tablet TAKE 1 TABLET BY MOUTH  DAILY 02/06/21   Ladell Pier, MD  HYDROcodone-acetaminophen (NORCO) 7.5-325 MG tablet Take 1 tablet by mouth 3 (three) times daily as needed. 05/30/21   [provider]  ipratropium-albuterol (DUONEB) 0.5-2.5 (3) MG/3ML SOLN INHALE 1 VIAL VIA NEBULIZER EVERY 4 HOURS AS NEEDED FOR BREATHING 11/25/21   Olalere, Adewale A, MD  lisinopril-hydrochlorothiazide (ZESTORETIC) 10-12.5 MG tablet Take 1 tablet by mouth daily. 05/07/21   Ladell Pier, MD  Mepolizumab (NUCALA) 100 MG/ML SOAJ Inject 1 mL (100 mg total) into the skin every 28 (twenty-eight) days. **patient assistance program** 11/26/21   Laurin Coder, MD  metFORMIN (GLUCOPHAGE-XR) 500 MG 24 hr tablet TAKE 1 TABLET BY MOUTH DAILY WITH BREAKFAST 12/20/21   Ladell Pier, MD  montelukast (SINGULAIR) 10 MG tablet Take 1 tablet (10 mg total) by mouth at bedtime. 05/07/21   Ladell Pier, MD  pravastatin (PRAVACHOL) 40 MG tablet Take 1 tablet (40 mg total) by mouth at bedtime. 05/07/21   Ladell Pier, MD  TRUEPLUS LANCETS 28G MISC Check glucose 2 times a day 02/01/16   Maren Reamer, MD    Physical Exam: Vitals:   01/15/22 1635 01/15/22 1645 01/15/22 1752 01/15/22 1753  BP:  (!) 165/74 109/63   Pulse: (!) 102 (!) 110 (!) 107   Resp: (!) _0 Temp:    98 F (36.7 C)  TempSrc:    Oral  SpO2: 99% 100% 100%    Weight:      Height:       Constitutional: NAD, calm, comfortable, nontoxic appearing elderly male laying on his side in bed and on BiPAP Eyes:  lids and conjunctivae normal ENMT: Mucous membranes are moist.  Neck: normal, supple Respiratory: Poor aeration throughout with diffuse rhonchi, wheezing but no crackles.  Normal respiratory effort. No accessory muscle use.  On BiPAP. Cardiovascular: Regular rate and rhythm, no murmurs / rubs / gallops.  Distal trace nonpitting edema bilateral lower extremity. Abdomen: Soft, nontender nondistended.  Bowel sounds positive.  Musculoskeletal: no clubbing / cyanosis. No joint deformity upper  and lower extremities. Good ROM, no contractures. Normal muscle tone.  Skin: no rashes, lesions, ulcers. No induration Neurologic: CN 2-12 grossly intact. Strength 5/5 in all 4.  Psychiatric: Normal judgment and insight. Alert and oriented x 3. Normal mood. Data Reviewed:  EKG on my review with sinus tachycardia and nonspecific T wave inversions in V4 to V6.  Assessment and Plan: * Acute respiratory failure with hypoxia (HCC) Secondary to severe persistent eosinophilic asthma with acute exacerbation requiring BiPAP -Has received DuoNeb, continuous albuterol nebulizer, IV magnesium and IV Solu-Medrol ED. Negative CXR. Negative flu/COVID. -continue albuterol PRN q4hr  -Continue daily IV Solu-Medrol 60 mg -wean Bipap as tolerated with O2 goal >92%  OSA (obstructive sleep apnea) If able to wean off Bipap then can switch to CPAP overnight  Chronic pain syndrome Continue home Norco  Diabetes mellitus with neuropathy ([place]) Controlled. Last HbA1C six months ago at 6.3% -monitor with morning labs for now   Hyperlipidemia Continue home statin  Essential hypertension Continue home antihypertensives      Advance Care Planning: Full  Consults: none  Family Communication: none at bedstide  Severity of Illness: The appropriate patient status for this  patient is INPATIENT. Inpatient status is judged to be reasonable and necessary in order to provide the required intensity of service to ensure the patient's safety. The patient's presenting symptoms, physical exam findings, and initial radiographic and laboratory data in the context of their chronic comorbidities is felt to place them at high risk for further clinical deterioration. Furthermore, it is not anticipated that the patient will be medically stable for discharge from the hospital within 2 midnights of admission.   * I certify that at the point of admission it is my clinical judgment that the patient will require inpatient hospital care spanning beyond 2 midnights from the point of admission due to high intensity of service, high risk for further deterioration and high frequency of surveillance required.*  Author: Orene Desanctis, DO 01/15/2022 7:20 PM  For on call review [email or web address].

## 2022-01-15 NOTE — Assessment & Plan Note (Signed)
If able to wean off Bipap then can switch to CPAP overnight

## 2022-01-15 NOTE — ED Triage Notes (Signed)
Pt. Stated, I have chest pain with some SOB cause I have asthma and a headache that started on Sunday

## 2022-01-15 NOTE — Assessment & Plan Note (Signed)
Controlled. Last HbA1C six months ago at 6.3% -monitor with morning labs for now

## 2022-01-15 NOTE — Assessment & Plan Note (Signed)
Secondary to severe persistent eosinophilic asthma with acute exacerbation requiring BiPAP -Has received DuoNeb, continuous albuterol nebulizer, IV magnesium and IV Solu-Medrol ED. Negative CXR. Negative flu/COVID. -continue albuterol PRN q4hr  -Continue daily IV Solu-Medrol 60 mg -wean Bipap as tolerated with O2 goal >92%

## 2022-01-15 NOTE — Assessment & Plan Note (Signed)
-   Continue home statin °

## 2022-01-15 NOTE — Progress Notes (Signed)
RRT assessed pt one hour after starting '20mg'$  CAT, no relief of SOB. Pt appears diaphoretic now, pt remains in tripod position. Vitals listed below.  HR 128  RR 36 SATs 100% on 10L O2 via aerosol mask.  Ins/Exp wheezing still heard throughout bilateral auscultation, more diminished at this time. Pt started on bipap 16/8 at 1735.

## 2022-01-15 NOTE — Progress Notes (Signed)
$'20mg'x$  CAT started at 1635.   HR 107 RR 28 SATs 95 on 4L [place]  Ins/Exp wheezing heard throughout with ventilation during auscultation, pt currently is unable to lay back in bed. RRT spoke with pt about the option of bipap if the Albuterol '20mg'$  doesn't help his asthma symptoms. Pt stated that he wears CPAP qhs at home and is familiar. RRT on standby with bipap if SOB does not improve with CAT.

## 2022-01-15 NOTE — ED Provider Triage Note (Signed)
Emergency Medicine Provider Triage Evaluation Note  Christian [name] , a 61 y.o. male  was evaluated in triage.  Pt complains of chest pain which began 3 to 4 days.  Describing as a constant sensation to the left chest, also has a headache.  Has been taking Tylenol without any improvement in his symptoms.  He does endorse a history of asthma and shortness of breath whenever he ambulates.  Review of Systems  Positive: Chest pain, sob, headache Negative: Nausea, vomiting  Physical Exam  BP (!) 155/87 (BP Location: Right Arm)   Pulse (!) 105   Temp 99.6 F (37.6 C) (Oral)   Resp (!) 22   SpO2 94%  Gen:   Awake, no distress   Resp:  Normal effort  MSK:   Moves extremities without difficulty  Other:    Medical Decision Making  Medically screening exam initiated at 9:13 AM.  Appropriate orders placed.  Christian [name] was informed that the remainder of the evaluation will be completed by another provider, this initial triage assessment does not replace that evaluation, and the importance of remaining in the ED until their evaluation is complete.     Janeece Fitting, PA-C 01/15/22 [phone]

## 2022-01-15 NOTE — ED Provider Notes (Signed)
[facility] EMERGENCY DEPARTMENT Provider Note   CSN: [id number] Arrival date & time: 01/15/22  0846     History  Chief Complaint  Patient presents with   Chest Pain   Shortness of Breath   Headache    Christian [name] is a 61 y.o. male.  61 year old male with a past medical history of asthma presents to the ED with a chief complaint of shortness of breath which began on Sunday.  Also stating pain along the substernal part of his chest with no radiation.  Reports he feels that he likely has an asthma exacerbation.  He was cardiac in triage with a heart rate of 105, was not tachypneic then however when brought back to the room via wheelchair patient's oxygen saturation was noted to be at 83% with a good Pleth.  He does not endorse any history of tobacco use.  He has not been coughing, has not had any fevers.  No prior history of blood clots.  No prior history of CAD.  The history is provided by the patient and medical records.  Chest Pain Pain location:  Substernal area Pain quality: aching   Pain radiates to:  Does not radiate Pain severity:  Moderate Onset quality:  Gradual Duration:  2 days Timing:  Constant Progression:  Worsening Chronicity:  Recurrent Context: breathing   Worsened by:  Deep breathing Ineffective treatments:  None tried Associated symptoms: headache and shortness of breath   Associated symptoms: no abdominal pain, no back pain, no fever, no nausea and no vomiting   Risk factors: male sex   Risk factors: no coronary artery disease, no diabetes mellitus, no prior DVT/PE, no smoking and no surgery   Shortness of Breath Associated symptoms: chest pain and headaches   Associated symptoms: no abdominal pain, no fever and no vomiting   Headache Associated symptoms: no abdominal pain, no back pain, no fever, no nausea and no vomiting        Home Medications Prior to Admission medications   Medication Sig Start Date End Date Taking?  Authorizing Provider  albuterol (VENTOLIN HFA) 108 (90 Base) MCG/ACT inhaler 1-2 puffs as needed for wheezing 10/10/21   Sherrilyn Rist A, MD  benzonatate (TESSALON) 100 MG capsule Take 1 capsule (100 mg total) by mouth 2 (two) times daily as needed for cough. 06/03/21   Ladell Pier, MD  Blood Glucose Monitoring Suppl (ONE TOUCH ULTRA 2) w/Device KIT Use as directed 3 times daily E11.9 03/31/17   Ladell Pier, MD  Budeson-Glycopyrrol-Formoterol (BREZTRI AEROSPHERE) 160-9-4.8 MCG/ACT AERO Inhale 2 puffs into the lungs 2 (two) times daily. 05/02/21   Olalere, Ernesto Rutherford, MD  Budeson-Glycopyrrol-Formoterol (BREZTRI AEROSPHERE) 160-9-4.8 MCG/ACT AERO Inhale 2 puffs into the lungs in the morning and at bedtime. 12/25/21   Olalere, Ernesto Rutherford, MD  Budeson-Glycopyrrol-Formoterol (BREZTRI AEROSPHERE) 160-9-4.8 MCG/ACT AERO Inhale 2 puffs into the lungs in the morning and at bedtime. 12/25/21   Laurin Coder, MD  cetirizine (ZYRTEC) 10 MG tablet Take 1 tablet (10 mg total) by mouth daily. 08/16/21   Ladell Pier, MD  fluticasone (FLONASE) 50 MCG/ACT nasal spray Place 2 sprays into both nostrils daily. 04/24/20   Mayers, Cari S, PA-C  furosemide (LASIX) 20 MG tablet TAKE 1 TABLET BY MOUTH ONCE DAILY FOR  ONE  WEEK  THEN  AS  NEEDED  FOR  SWELLING 04/15/21   Ladell Pier, MD  glucose blood (ONETOUCH VERIO) test strip USE TO TEST BLOOD SUGAR  AS DIRECTED THREE TIMES A DAY 12/20/21   Ladell Pier, MD  hydrochlorothiazide (HYDRODIURIL) 12.5 MG tablet TAKE 1 TABLET BY MOUTH  DAILY 02/06/21   Ladell Pier, MD  HYDROcodone-acetaminophen (NORCO) 7.5-325 MG tablet Take 1 tablet by mouth 3 (three) times daily as needed. 05/30/21   [provider]  ipratropium-albuterol (DUONEB) 0.5-2.5 (3) MG/3ML SOLN INHALE 1 VIAL VIA NEBULIZER EVERY 4 HOURS AS NEEDED FOR BREATHING 11/25/21   Olalere, Adewale A, MD  lisinopril-hydrochlorothiazide (ZESTORETIC) 10-12.5 MG tablet Take 1 tablet by mouth  daily. 05/07/21   Ladell Pier, MD  Mepolizumab (NUCALA) 100 MG/ML SOAJ Inject 1 mL (100 mg total) into the skin every 28 (twenty-eight) days. **patient assistance program** 11/26/21   Laurin Coder, MD  metFORMIN (GLUCOPHAGE-XR) 500 MG 24 hr tablet TAKE 1 TABLET BY MOUTH DAILY WITH BREAKFAST 12/20/21   Ladell Pier, MD  montelukast (SINGULAIR) 10 MG tablet Take 1 tablet (10 mg total) by mouth at bedtime. 05/07/21   Ladell Pier, MD  pravastatin (PRAVACHOL) 40 MG tablet Take 1 tablet (40 mg total) by mouth at bedtime. 05/07/21   Ladell Pier, MD  TRUEPLUS LANCETS 28G MISC Check glucose 2 times a day 02/01/16   Maren Reamer, MD      Allergies    Phenergan [promethazine hcl]    Review of Systems   Review of Systems  Constitutional:  Negative for chills and fever.  Respiratory:  Positive for shortness of breath.   Cardiovascular:  Positive for chest pain.  Gastrointestinal:  Negative for abdominal pain, nausea and vomiting.  Genitourinary:  Negative for flank pain.  Musculoskeletal:  Negative for back pain.  Neurological:  Positive for headaches.  All other systems reviewed and are negative.   Physical Exam Updated Vital Signs BP 109/63   Pulse (!) 107   Temp 98 F (36.7 C) (Oral)   Resp 18   Ht 5' 10"  (1.778 m)   Wt 86.2 kg   SpO2 100%   BMI 27.26 kg/m  Physical Exam Vitals and nursing note reviewed.  Constitutional:      Appearance: He is well-developed.  HENT:     Head: Normocephalic and atraumatic.  Cardiovascular:     Rate and Rhythm: Tachycardia present.  Pulmonary:     Effort: Tachypnea present.     Breath sounds: Examination of the right-upper field reveals wheezing. Examination of the left-upper field reveals wheezing. Examination of the right-middle field reveals wheezing. Examination of the right-lower field reveals wheezing. Examination of the left-lower field reveals wheezing. Wheezing present.  Chest:     Chest wall: No tenderness.   Abdominal:     Palpations: Abdomen is soft.  Musculoskeletal:     Right lower leg: No edema.     Left lower leg: No edema.  Skin:    General: Skin is warm and dry.  Neurological:     Mental Status: He is alert and oriented to person, place, and time.     ED Results / Procedures / Treatments   Labs (all labs ordered are listed, but only abnormal results are displayed) Labs Reviewed  BASIC METABOLIC PANEL - Abnormal; Notable for the following components:      Result Value   Potassium 3.4 (*)    Glucose, Bld 143 (*)    Creatinine, Ser 1.39 (*)    GFR, Estimated 57 (*)    All other components within normal limits  I-STAT VENOUS BLOOD GAS, ED - Abnormal; Notable  for the following components:   pO2, Ven 50 (*)    All other components within normal limits  RESP PANEL BY RT-PCR (FLU A&B, COVID) ARPGX2  CBC WITH DIFFERENTIAL/PLATELET  BLOOD GAS, VENOUS    EKG None  Radiology DG Chest Portable 1 View  Result Date: 01/15/2022 CLINICAL DATA:  Shortness of breath EXAM: PORTABLE CHEST 1 VIEW COMPARISON:  Radiograph 12/01/2019 FINDINGS: The cardiomediastinal silhouette is within normal limits. There is no focal airspace disease. There is no pleural effusion. No pneumothorax. There is no acute osseous abnormality. IMPRESSION: No evidence of acute cardiopulmonary disease. Electronically Signed   By: Maurine Simmering M.D.   On: 01/15/2022 16:08    Procedures .Critical Care  Performed by: Janeece Fitting, PA-C Authorized by: Janeece Fitting, PA-C   Critical care provider statement:    Critical care time (minutes):  45   Critical care start time:  01/15/2022 4:30 PM   Critical care end time:  01/15/2022 5:15 PM   Critical care was necessary to treat or prevent imminent or life-threatening deterioration of the following conditions:  Respiratory failure   Care discussed with: admitting provider       Medications Ordered in ED Medications  ipratropium-albuterol (DUONEB) 0.5-2.5 (3) MG/3ML  nebulizer solution (  Not Given 01/15/22 1404)  albuterol (PROVENTIL) (2.5 MG/3ML) 0.083% nebulizer solution (  Canceled Entry 01/15/22 1636)  magnesium sulfate IVPB 2 g 50 mL (0 g Intravenous Stopped 01/15/22 1521)  methylPREDNISolone sodium succinate (SOLU-MEDROL) 125 mg/2 mL injection 125 mg (125 mg Intravenous Given 01/15/22 1413)  albuterol (PROVENTIL) (2.5 MG/3ML) 0.083% nebulizer solution 10 mg (10 mg Nebulization Given 01/15/22 1404)  albuterol (PROVENTIL,VENTOLIN) solution continuous neb (20 mg/hr Nebulization Given 01/15/22 1635)  ipratropium (ATROVENT) nebulizer solution 0.5 mg (0.5 mg Nebulization Given 01/15/22 1636)    ED Course/ Medical Decision Making/ A&P                           Medical Decision Making Amount and/or Complexity of Data Reviewed Labs: ordered. Radiology: ordered.  Risk Prescription drug management.   This patient presents to the ED for concern of chest pain, shortness of breath, this involves a number of treatment options, and is a complaint that carries with it a high risk of complications and morbidity.  The differential diagnosis includes ACS, Asthma exacerbation, CHF versus infection.    Co morbidities: Discussed in HPI   Brief History:  Patient here with two days of shortness of breath and substernal chest pain which began approximately 2 days ago.  Does endorse a prior history of asthma and COPD although no tobacco use.  Reports trying to use his inhaler without any improvement in symptoms.  Describing this as a substernal ache with no radiation.  No prior history of CAD, no prior history of CHF, no leg swelling noted.  EMR reviewed including pt PMHx, past surgical history and past visits to ER.   See HPI for more details   Lab Tests:  I ordered and independently interpreted labs.  The pertinent results include:    I personally reviewed all laboratory work and imaging. Metabolic panel without any acute abnormality specifically kidney function  within normal limits and no significant electrolyte abnormalities. CBC without leukocytosis or significant anemia.  Negative covid . Negative influenza A, influenza B are negative.   Imaging Studies:  Chest x-ray with no acute finding.  In the setting of tachycardia, hypoxia with an oxygen saturation 85% with arrival  into the room considered pulmonary embolism.   Cardiac Monitoring:  The patient was maintained on a cardiac monitor.  I personally viewed and interpreted the cardiac monitored which showed an underlying rhythm of: Sinus tach EKG non-ischemic   Medicines ordered:  I ordered medication including albuterol, Solu-Medrol for shortness of breath and symptomatic treatment. Reevaluation of the patient after these medicines showed that the patient worsened I have reviewed the patients home medicines and have made adjustments as needed   Critical Interventions:  Continuous breathing treatment with albuterol, given magnesium, given Solu-Medrol without any improvement in symptoms.  Also given a second round of nebulizer treatment.  Reevaluation:  After the interventions noted above I re-evaluated patient and found that they have :worsened   Social Determinants of Health:  The patient's social determinants of health were a factor in the care of this patient    Problem List / ED Course:  Patient here with chest pain along with shortness of breath for the past 2 days.  He currently takes  [place] and Nucala injections due to chart review and last [place] pulmonology note.  Tried his inhaler without any improvement in symptoms.  Also endorsing some substernal chest pressure with no radiation.  Denies any tobacco use, no cough, no fevers. Labs on today's visit are unremarkable.  He received multiple rounds of continuous duo nebs but persistently tachypneic and tachycardic.  Consider pulmonary embolism due to hypoxia with an oxygen saturation of 85% with a good [place], placed on 2 L  via nasal cannula up to 95%.  And is declining this test at this time.  X-ray did not show any pneumonia, no pleural effusions to account for his shortness of breath.  No leg swelling, no prior history of CHF, no signs of fluid overload on exam. Given multiple rounds of abuterol treatments, and steroids without much improvement.Will place patient on bipap at this time.  He does report having a CPAP at home which according to pulmonology note he does not wear however he states he now started to wear this again.  Will need admission for further management of his asthma.  No prior history of intubations or ICU stays due to asthma exacerbation.   Dispostion:  After consideration of the diagnostic results and the patients response to treatment, I feel that the patent would benefit from admission for further management of his asthma. 6:36 PM Spoke to Dr. Wyline Copas who will make patient for further management.  Appreciate her assistance.   Portions of this note were generated with Lobbyist. Dictation errors may occur despite best attempts at proofreading.   Final Clinical Impression(s) / ED Diagnoses Final diagnoses:  Shortness of breath  Severe persistent asthma with status asthmaticus    Rx / DC Orders ED Discharge Orders     None         Janeece Fitting, PA-C 01/15/22 1836    Elnora Morrison, MD 01/16/22 1414

## 2022-01-15 NOTE — ED Notes (Signed)
Pt O2 sat was 83% on RA initially. Pt was SOB and was not able to finish sentences without pausing to catch his breath. O2 at 2LPM [place] initiated. Cardiac monitoring in place.

## 2022-01-15 NOTE — ED Notes (Signed)
Pt refused CT at this time as he states he would not be able to lay down flat. Pt continues to report SOB and states he needs more breathing treatment. Provider notified. O2 at 95% on 2LPM [place]. Cardiac monitoring in place.

## 2022-01-15 NOTE — ED Notes (Signed)
Pt is AxO x 4, tolerating bipap well, no distress at this time, uses urinal to void

## 2022-01-16 ENCOUNTER — Other Ambulatory Visit: Payer: Self-pay | Admitting: Internal Medicine

## 2022-01-16 DIAGNOSIS — I10 Essential (primary) hypertension: Secondary | ICD-10-CM | POA: Diagnosis not present

## 2022-01-16 DIAGNOSIS — J4552 Severe persistent asthma with status asthmaticus: Secondary | ICD-10-CM

## 2022-01-16 DIAGNOSIS — J9601 Acute respiratory failure with hypoxia: Secondary | ICD-10-CM | POA: Diagnosis not present

## 2022-01-16 LAB — BLOOD GAS, VENOUS
Acid-Base Excess: 2.5 mmol/L — ABNORMAL HIGH (ref 0.0–2.0)
Bicarbonate: 28.4 mmol/L — ABNORMAL HIGH (ref 20.0–28.0)
Drawn by: 65930
O2 Saturation: 59.9 %
Patient temperature: 37
pCO2, Ven: 48 mmHg (ref 44–60)
pH, Ven: 7.38 (ref 7.25–7.43)
pO2, Ven: 36 mmHg (ref 32–45)

## 2022-01-16 LAB — BASIC METABOLIC PANEL
Anion gap: 9 (ref 5–15)
BUN: 12 mg/dL (ref 8–23)
CO2: 24 mmol/L (ref 22–32)
Calcium: 9.7 mg/dL (ref 8.9–10.3)
Chloride: 107 mmol/L (ref 98–111)
Creatinine, Ser: 1.37 mg/dL — ABNORMAL HIGH (ref 0.61–1.24)
GFR, Estimated: 58 mL/min — ABNORMAL LOW (ref >60)
Glucose, Bld: 174 mg/dL — ABNORMAL HIGH (ref 70–99)
Potassium: 3.8 mmol/L (ref 3.5–5.1)
Sodium: 140 mmol/L (ref 135–145)

## 2022-01-16 LAB — CBC
HCT: 39.9 % (ref 39.0–52.0)
Hemoglobin: 13.8 g/dL (ref 13.0–17.0)
MCH: 30.4 pg (ref 26.0–34.0)
MCHC: 34.6 g/dL (ref 30.0–36.0)
MCV: 87.9 fL (ref 80.0–100.0)
Platelets: 184 10*3/uL (ref 150–400)
RBC: 4.54 MIL/uL (ref 4.22–5.81)
RDW: 14 % (ref 11.5–15.5)
WBC: 6.7 10*3/uL (ref 4.0–10.5)
nRBC: 0 % (ref 0.0–0.2)

## 2022-01-16 LAB — D-DIMER, QUANTITATIVE: D-Dimer, Quant: 0.46 ug/mL-FEU (ref 0.00–0.50)

## 2022-01-16 MED ORDER — LORATADINE 10 MG PO TABS
10.0000 mg | ORAL_TABLET | Freq: Every day | ORAL | Status: DC
  Administered 2022-01-17 – 2022-01-19 (×3): 10 mg via ORAL
  Filled 2022-01-16 (×4): qty 1

## 2022-01-16 MED ORDER — SODIUM CHLORIDE 0.45 % IV SOLN: INTRAVENOUS | Status: AC

## 2022-01-16 MED ORDER — MOMETASONE FURO-FORMOTEROL FUM 200-5 MCG/ACT IN AERO
2.0000 | INHALATION_SPRAY | Freq: Two times a day (BID) | RESPIRATORY_TRACT | Status: DC
  Administered 2022-01-17 – 2022-01-19 (×5): 2 via RESPIRATORY_TRACT
  Filled 2022-01-16: qty 8.8

## 2022-01-16 MED ORDER — UMECLIDINIUM BROMIDE 62.5 MCG/ACT IN AEPB
1.0000 | INHALATION_SPRAY | Freq: Every day | RESPIRATORY_TRACT | Status: DC
  Filled 2022-01-16: qty 7

## 2022-01-16 MED ORDER — HYDRALAZINE HCL 20 MG/ML IJ SOLN
10.0000 mg | Freq: Four times a day (QID) | INTRAMUSCULAR | Status: DC | PRN
  Filled 2022-01-16: qty 1

## 2022-01-16 MED ORDER — MONTELUKAST SODIUM 10 MG PO TABS
10.0000 mg | ORAL_TABLET | Freq: Every day | ORAL | Status: DC
  Administered 2022-01-16 – 2022-01-18 (×3): 10 mg via ORAL
  Filled 2022-01-16 (×3): qty 1

## 2022-01-16 MED ORDER — ORAL CARE MOUTH RINSE: 15.0000 mL | OROMUCOSAL | Status: DC | PRN

## 2022-01-16 MED ORDER — IPRATROPIUM-ALBUTEROL 0.5-2.5 (3) MG/3ML IN SOLN
3.0000 mL | Freq: Four times a day (QID) | RESPIRATORY_TRACT | Status: DC
  Administered 2022-01-16 – 2022-01-19 (×11): 3 mL via RESPIRATORY_TRACT
  Filled 2022-01-16 (×11): qty 3

## 2022-01-16 MED ORDER — BUDESON-GLYCOPYRROL-FORMOTEROL 160-9-4.8 MCG/ACT IN AERO: 2.0000 | INHALATION_SPRAY | Freq: Two times a day (BID) | RESPIRATORY_TRACT | Status: DC

## 2022-01-16 MED ORDER — ORAL CARE MOUTH RINSE
15.0000 mL | OROMUCOSAL | Status: DC
  Administered 2022-01-16 – 2022-01-19 (×10): 15 mL via OROMUCOSAL

## 2022-01-16 MED ORDER — METHYLPREDNISOLONE SODIUM SUCC 125 MG IJ SOLR
120.0000 mg | INTRAMUSCULAR | Status: DC
  Administered 2022-01-16 – 2022-01-19 (×4): 120 mg via INTRAVENOUS
  Filled 2022-01-16 (×4): qty 2

## 2022-01-16 NOTE — Progress Notes (Signed)
[facility] PROGRESS NOTE   Christian [name] ENI:[id number] DOB: [birth year] DOA: 01/15/2022  PCP: Ladell Pier, MD  Brief History/Interval Summary: 61 y.o. male with medical history significant for eosinophilic asthma on Nucala, allergic rhinitis, OSA on CPAP, hypertension, hyperlipidemia, type 2 diabetes,chronic pain who presented with increasing shortness of breath.  Thought to have acute asthma exacerbation.  Was hospitalized for further management.  Consultants: None yet  Procedures: None    Subjective/Interval History: Patient mentions that he is slightly better today compared to yesterday.  Occasional cough.  No chest pain.  Some anxiety is present.  No nausea or vomiting.    Assessment/Plan:  Acute asthma exacerbation/acute respiratory failure with hypoxia Patient denies smoking any cigarettes.  He is followed by pulmonologist, Dr. Ander Slade. Noted to have significant wheezing at the time of admission.  Hospitalized for further management.  Requiring BiPAP.  Noted to be on nebulizer treatment, Dulera, Singulair, Incruse Ellipta as well as steroids.  Will increase the dose of the Solu-Medrol. Chest x-ray was unremarkable.  D-dimer was normal.  COVID-19 test was negative.  Influenza was negative.  Obstructive sleep apnea Uses CPAP at home.  Chronic pain syndrome Continue home medications.  Diabetes mellitus type 2 with neuropathy HbA1c 6 months ago was 6.3.  Monitor CBGs.  Anticipate some worsening in his glucose levels due to steroids.  Essential hypertension Occasional high readings noted.  Home medication list has not been reconciled yet but it appears that he could be on lisinopril HCTZ.  Use as needed medications.  Wait for medication reconciliation to be completed.  Hyperlipidemia Continue statin.   DVT Prophylaxis: Lovenox Code Status: Full code Family Communication: Discussed with patient Disposition Plan: Hopefully return home when  improved  Status is: Inpatient Remains inpatient appropriate because: Acute asthma exacerbation, respiratory failure      Medications: Scheduled:  enoxaparin (LOVENOX) injection  40 mg Subcutaneous Q24H   ipratropium-albuterol  3 mL Nebulization Q4H   loratadine  10 mg Oral Daily   methylPREDNISolone (SOLU-MEDROL) injection  60 mg Intravenous Daily   [START ON 01/17/2022] mometasone-formoterol  2 puff Inhalation BID   montelukast  10 mg Oral QHS   umeclidinium bromide  1 puff Inhalation Daily   Continuous: [id number]  Antibiotics: Anti-infectives (From admission, onward)    None       Objective:  Vital Signs  Vitals:   01/16/22 0112 01/16/22 0230 01/16/22 0550 01/16/22 0800  BP:   127/78   Pulse: 81  80 89  Resp: '13  13 20  '$ Temp:   98.3 F (36.8 C)   TempSrc:   Oral   SpO2: 98% 99% 99% 100%  Weight:      Height:        Intake/Output Summary (Last 24 hours) at 01/16/2022 0916 Last data filed at 01/15/2022 1521 Gross per 24 hour  Intake 50 ml  Output --  Net 50 ml   Filed Weights   01/15/22 0926  Weight: 86.2 kg    General appearance: Awake alert.  In no distress Resp: Mildly tachypneic.  No use of accessory muscles.  Wheezing heard bilaterally.  No definite crackles. Cardio: S1-S2 is normal regular.  No S3-S4.  No rubs murmurs or bruit GI: Abdomen is soft.  Nontender nondistended.  Bowel sounds are present normal.  No masses organomegaly Extremities: No edema.  Full range of motion of lower extremities. Neurologic: Alert and oriented x3.  No focal neurological deficits.    Lab Results:  Data Reviewed:  I have personally reviewed following labs and reports of the imaging studies  CBC: Recent Labs  Lab 01/15/22 0922 01/15/22 1439 01/16/22 0125  WBC 5.9  --  6.7  NEUTROABS 4.7  --   --   HGB 13.7 15.3 13.8  HCT 40.5 45.0 39.9  MCV 88.4  --  87.9  PLT 192  --  381    Basic Metabolic Panel: Recent Labs  Lab 01/15/22 0922 01/15/22 1439  01/16/22 0125  NA 139 141 140  K 3.4* 3.9 3.8  CL 106  --  107  CO2 24  --  24  GLUCOSE 143*  --  174*  BUN 11  --  12  CREATININE 1.39*  --  1.37*  CALCIUM 9.6  --  9.7    GFR: Estimated Creatinine Clearance: 57.7 mL/min (A) (by C-G formula based on SCr of 1.37 mg/dL (H)).    Recent Results (from the past 240 hour(s))  Resp Panel by RT-PCR (Flu A&B, Covid) Anterior Nasal Swab     Status: None   Collection Time: 01/15/22  1:59 PM   Specimen: Anterior Nasal Swab  Result Value Ref Range Status   SARS Coronavirus 2 by RT PCR NEGATIVE NEGATIVE Final    Comment: (NOTE) SARS-CoV-2 target nucleic acids are NOT DETECTED.  The SARS-CoV-2 RNA is generally detectable in upper respiratory specimens during the acute phase of infection. The lowest concentration of SARS-CoV-2 viral copies this assay can detect is 138 copies/mL. A negative result does not preclude SARS-Cov-2 infection and should not be used as the sole basis for treatment or other patient management decisions. A negative result may occur with  improper specimen collection/handling, submission of specimen other than nasopharyngeal swab, presence of viral mutation(s) within the areas targeted by this assay, and inadequate number of viral copies(<138 copies/mL). A negative result must be combined with clinical observations, patient history, and epidemiological information. The expected result is Negative.  Fact Sheet for Patients:  EntrepreneurPulse.com.au  Fact Sheet for Healthcare Providers:  IncredibleEmployment.be  This test is no t yet approved or cleared by the [place] FDA and  has been authorized for detection and/or diagnosis of SARS-CoV-2 by FDA under an Emergency Use Authorization (EUA). This EUA will remain  in effect (meaning this test can be used) for the duration of the COVID-19 declaration under Section 564(b)(1) of the Act, 21 U.S.C.section 360bbb-3(b)(1), unless  the authorization is terminated  or revoked sooner.       Influenza A by PCR NEGATIVE NEGATIVE Final   Influenza B by PCR NEGATIVE NEGATIVE Final    Comment: (NOTE) The Xpert Xpress SARS-CoV-2/FLU/RSV plus assay is intended as an aid in the diagnosis of influenza from Nasopharyngeal swab specimens and should not be used as a sole basis for treatment. Nasal washings and aspirates are unacceptable for Xpert Xpress SARS-CoV-2/FLU/RSV testing.  Fact Sheet for Patients: EntrepreneurPulse.com.au  Fact Sheet for Healthcare Providers: IncredibleEmployment.be  This test is not yet approved or cleared by the [place] FDA and has been authorized for detection and/or diagnosis of SARS-CoV-2 by FDA under an Emergency Use Authorization (EUA). This EUA will remain in effect (meaning this test can be used) for the duration of the COVID-19 declaration under Section 564(b)(1) of the Act, 21 U.S.C. section 360bbb-3(b)(1), unless the authorization is terminated or revoked.  Performed at [facility], [place] [address] [place], [place] [address]       Radiology Studies: DG Chest Portable 1 View  Result Date:  01/15/2022 CLINICAL DATA:  Shortness of breath EXAM: PORTABLE CHEST 1 VIEW COMPARISON:  Radiograph 12/01/2019 FINDINGS: The cardiomediastinal silhouette is within normal limits. There is no focal airspace disease. There is no pleural effusion. No pneumothorax. There is no acute osseous abnormality. IMPRESSION: No evidence of acute cardiopulmonary disease. Electronically Signed   By: Maurine Simmering M.D.   On: 01/15/2022 16:08       LOS: 1 day   [facility] Pager on [email or web address]  01/16/2022, 9:16 AM

## 2022-01-17 ENCOUNTER — Encounter (HOSPITAL_COMMUNITY): Payer: Self-pay | Admitting: Family Medicine

## 2022-01-17 DIAGNOSIS — J9601 Acute respiratory failure with hypoxia: Secondary | ICD-10-CM | POA: Diagnosis not present

## 2022-01-17 LAB — CBC
HCT: 41.3 % (ref 39.0–52.0)
Hemoglobin: 13.5 g/dL (ref 13.0–17.0)
MCH: 29.2 pg (ref 26.0–34.0)
MCHC: 32.7 g/dL (ref 30.0–36.0)
MCV: 89.2 fL (ref 80.0–100.0)
Platelets: 169 K/uL (ref 150–400)
RBC: 4.63 MIL/uL (ref 4.22–5.81)
RDW: 14.4 % (ref 11.5–15.5)
WBC: 9.4 K/uL (ref 4.0–10.5)
nRBC: 0 % (ref 0.0–0.2)

## 2022-01-17 LAB — BASIC METABOLIC PANEL
Anion gap: 9 (ref 5–15)
BUN: 19 mg/dL (ref 8–23)
CO2: 23 mmol/L (ref 22–32)
Calcium: 9.2 mg/dL (ref 8.9–10.3)
Chloride: 108 mmol/L (ref 98–111)
Creatinine, Ser: 1.34 mg/dL — ABNORMAL HIGH (ref 0.61–1.24)
GFR, Estimated: 60 mL/min — ABNORMAL LOW (ref >60)
Glucose, Bld: 129 mg/dL — ABNORMAL HIGH (ref 70–99)
Potassium: 4.6 mmol/L (ref 3.5–5.1)
Sodium: 140 mmol/L (ref 135–145)

## 2022-01-17 MED ORDER — MELATONIN 3 MG PO TABS
3.0000 mg | ORAL_TABLET | Freq: Every day | ORAL | Status: DC
  Administered 2022-01-17 – 2022-01-18 (×2): 3 mg via ORAL
  Filled 2022-01-17 (×2): qty 1

## 2022-01-17 NOTE — Plan of Care (Signed)
  Problem: Education: Goal: Knowledge of General Education information will improve Description Including pain rating scale, medication(s)/side effects and non-pharmacologic comfort measures Outcome: Progressing   Problem: Clinical Measurements: Goal: Respiratory complications will improve Outcome: Progressing   Problem: Safety: Goal: Ability to remain free from injury will improve Outcome: Progressing   Problem: Skin Integrity: Goal: Risk for impaired skin integrity will decrease Outcome: Progressing   

## 2022-01-17 NOTE — Progress Notes (Signed)
[facility] PROGRESS NOTE   Christian [name] Diamantina Monks [id number] DOB: 05-12-1960 DOA: 01/15/2022  PCP: Ladell Pier, MD  Brief History/Interval Summary: 61 y.o. male with medical history significant for eosinophilic asthma on Nucala, allergic rhinitis, OSA on CPAP, hypertension, hyperlipidemia, type 2 diabetes,chronic pain who presented with increasing shortness of breath.  Thought to have acute asthma exacerbation.  Was hospitalized for further management.  Consultants: None yet  Procedures: None    Subjective/Interval History: No acute issues or events overnight, respiratory status markedly improving over the past 24 hours, minimal wheeze, continues to have symptoms with exertion but generally improving.  Assessment/Plan:  Acute asthma exacerbation/acute respiratory failure with hypoxia Patient denies smoking any cigarettes.  He is followed by pulmonologist, Dr. Ander Slade. Noted to have significant wheezing at the time of admission.  Hospitalized for further management.  Requiring BiPAP.  Noted to be on nebulizer treatment, Dulera, Singulair, Incruse Ellipta as well as steroids.  Will increase the dose of the Solu-Medrol. Chest x-ray was unremarkable.  D-dimer was normal.  COVID-19 test was negative.  Influenza was negative. Advance diet, weaned off BiPAP this morning to nasal cannula Ambulatory oxygen screen pending  Obstructive sleep apnea Uses CPAP at home.  Chronic pain syndrome Continue home medications.  Diabetes mellitus type 2 with neuropathy HbA1c 6 months ago was 6.3.  Monitor CBGs.  Anticipate some worsening in his glucose levels due to steroids.  Essential hypertension Occasional high readings noted.  Home medication list has not been reconciled yet but it appears that he could be on lisinopril HCTZ.  Use as needed medications.  Wait for medication reconciliation to be completed.  Hyperlipidemia Continue statin.   DVT Prophylaxis: Lovenox Code Status:  Full code Family Communication: Discussed with patient Disposition Plan: Hopefully return home when improved  Status is: Inpatient Remains inpatient appropriate because: Acute asthma exacerbation, respiratory failure      Medications: Scheduled:  enoxaparin (LOVENOX) injection  40 mg Subcutaneous Q24H   ipratropium-albuterol  3 mL Nebulization Q6H   loratadine  10 mg Oral Daily   methylPREDNISolone (SOLU-MEDROL) injection  120 mg Intravenous Q24H   mometasone-formoterol  2 puff Inhalation BID   montelukast  10 mg Oral QHS   mouth rinse  15 mL Mouth Rinse 4 times per day   Continuous:  sodium chloride 75 mL/hr at 01/16/22 1853   [id number], hydrALAZINE, mouth rinse  Antibiotics: Anti-infectives (From admission, onward)    None       Objective:  Vital Signs  Vitals:   01/16/22 1948 01/16/22 2056 01/16/22 2058 01/17/22 0731  BP:   (!) 148/91   Pulse:   65   Resp:   19   Temp: 98.6 F (37 C)     TempSrc:      SpO2:  100%  100%  Weight:      Height:        Intake/Output Summary (Last 24 hours) at 01/17/2022 0753 Last data filed at 01/16/2022 1950 Gross per 24 hour  Intake --  Output 300 ml  Net -300 ml    Filed Weights   01/15/22 0926  Weight: 86.2 kg    General appearance: Awake alert.  In no distress Resp: Mildly tachypneic.  No use of accessory muscles.  Wheezing heard bilaterally.  No definite crackles. Cardio: S1-S2 is normal regular.  No S3-S4.  No rubs murmurs or bruit GI: Abdomen is soft.  Nontender nondistended.  Bowel sounds are present normal.  No masses organomegaly Extremities: No edema.  Full range of motion of lower extremities. Neurologic: Alert and oriented x3.  No focal neurological deficits.    Lab Results:  Data Reviewed: I have personally reviewed following labs and reports of the imaging studies  CBC: Recent Labs  Lab 01/15/22 0922 01/15/22 1439 01/16/22 0125 01/17/22 0054  WBC 5.9  --  6.7 9.4  NEUTROABS 4.7  --    --   --   HGB 13.7 15.3 13.8 13.5  HCT 40.5 45.0 39.9 41.3  MCV 88.4  --  87.9 89.2  PLT 192  --  184 169     Basic Metabolic Panel: Recent Labs  Lab 01/15/22 0922 01/15/22 1439 01/16/22 0125 01/17/22 0054  NA 139 141 140 140  K 3.4* 3.9 3.8 4.6  CL 106  --  107 108  CO2 24  --  24 23  GLUCOSE 143*  --  174* 129*  BUN 11  --  12 19  CREATININE 1.39*  --  1.37* 1.34*  CALCIUM 9.6  --  9.7 9.2     GFR: Estimated Creatinine Clearance: 59 mL/min (A) (by C-G formula based on SCr of 1.34 mg/dL (H)).    Recent Results (from the past 240 hour(s))  Resp Panel by RT-PCR (Flu A&B, Covid) Anterior Nasal Swab     Status: None   Collection Time: 01/15/22  1:59 PM   Specimen: Anterior Nasal Swab  Result Value Ref Range Status   SARS Coronavirus 2 by RT PCR NEGATIVE NEGATIVE Final    Comment: (NOTE) SARS-CoV-2 target nucleic acids are NOT DETECTED.  The SARS-CoV-2 RNA is generally detectable in upper respiratory specimens during the acute phase of infection. The lowest concentration of SARS-CoV-2 viral copies this assay can detect is 138 copies/mL. A negative result does not preclude SARS-Cov-2 infection and should not be used as the sole basis for treatment or other patient management decisions. A negative result may occur with  improper specimen collection/handling, submission of specimen other than nasopharyngeal swab, presence of viral mutation(s) within the areas targeted by this assay, and inadequate number of viral copies(<138 copies/mL). A negative result must be combined with clinical observations, patient history, and epidemiological information. The expected result is Negative.  Fact Sheet for Patients:  EntrepreneurPulse.com.au  Fact Sheet for Healthcare Providers:  IncredibleEmployment.be  This test is no t yet approved or cleared by the [place] FDA and  has been authorized for detection and/or diagnosis of SARS-CoV-2  by FDA under an Emergency Use Authorization (EUA). This EUA will remain  in effect (meaning this test can be used) for the duration of the COVID-19 declaration under Section 564(b)(1) of the Act, 21 U.S.C.section 360bbb-3(b)(1), unless the authorization is terminated  or revoked sooner.       Influenza A by PCR NEGATIVE NEGATIVE Final   Influenza B by PCR NEGATIVE NEGATIVE Final    Comment: (NOTE) The Xpert Xpress SARS-CoV-2/FLU/RSV plus assay is intended as an aid in the diagnosis of influenza from Nasopharyngeal swab specimens and should not be used as a sole basis for treatment. Nasal washings and aspirates are unacceptable for Xpert Xpress SARS-CoV-2/FLU/RSV testing.  Fact Sheet for Patients: EntrepreneurPulse.com.au  Fact Sheet for Healthcare Providers: IncredibleEmployment.be  This test is not yet approved or cleared by the [place] FDA and has been authorized for detection and/or diagnosis of SARS-CoV-2 by FDA under an Emergency Use Authorization (EUA). This EUA will remain in effect (meaning this test can be used) for the duration of the COVID-19 declaration  under Section 564(b)(1) of the Act, 21 U.S.C. section 360bbb-3(b)(1), unless the authorization is terminated or revoked.  Performed at [facility], [place] [address], [place], [place] [address]       Radiology Studies: DG Chest Portable 1 View  Result Date: 01/15/2022 CLINICAL DATA:  Shortness of breath EXAM: PORTABLE CHEST 1 VIEW COMPARISON:  Radiograph 12/01/2019 FINDINGS: The cardiomediastinal silhouette is within normal limits. There is no focal airspace disease. There is no pleural effusion. No pneumothorax. There is no acute osseous abnormality. IMPRESSION: No evidence of acute cardiopulmonary disease. Electronically Signed   By: Maurine Simmering M.D.   On: 01/15/2022 16:08       LOS: 2 days   Little Ishikawa  [facility] Pager on  [email or web address]  01/17/2022, 7:53 AM

## 2022-01-17 NOTE — Progress Notes (Signed)
Patient did not want to wear CPAP tonight, but said he would tomorrow night. Patient stated his nose was hurting. Patient wearing oxygen at 2lpm with SP02=97% with no signs of respiratory distress.

## 2022-01-18 DIAGNOSIS — J9601 Acute respiratory failure with hypoxia: Secondary | ICD-10-CM | POA: Diagnosis not present

## 2022-01-18 MED ORDER — HYDROCHLOROTHIAZIDE 12.5 MG PO TABS
12.5000 mg | ORAL_TABLET | Freq: Every day | ORAL | Status: DC
  Administered 2022-01-18 – 2022-01-19 (×2): 12.5 mg via ORAL
  Filled 2022-01-18 (×3): qty 1

## 2022-01-18 MED ORDER — ACETAMINOPHEN 325 MG PO TABS
650.0000 mg | ORAL_TABLET | ORAL | Status: DC | PRN
  Administered 2022-01-18: 650 mg via ORAL
  Filled 2022-01-18: qty 2

## 2022-01-18 MED ORDER — LEVOTHYROXINE SODIUM 25 MCG PO TABS
25.0000 ug | ORAL_TABLET | Freq: Every morning | ORAL | Status: DC
  Administered 2022-01-19: 25 ug via ORAL
  Filled 2022-01-18: qty 1

## 2022-01-18 MED ORDER — BACLOFEN 10 MG PO TABS
5.0000 mg | ORAL_TABLET | Freq: Two times a day (BID) | ORAL | Status: DC | PRN
  Administered 2022-01-18: 5 mg via ORAL
  Filled 2022-01-18: qty 1

## 2022-01-18 MED ORDER — BACLOFEN 5 MG PO TABS: 1.0000 | ORAL_TABLET | Freq: Two times a day (BID) | ORAL | Status: DC | PRN

## 2022-01-18 MED ORDER — FLUTICASONE PROPIONATE 50 MCG/ACT NA SUSP
2.0000 | Freq: Every day | NASAL | Status: DC
  Administered 2022-01-18 – 2022-01-19 (×2): 2 via NASAL
  Filled 2022-01-18 (×2): qty 16

## 2022-01-18 NOTE — Progress Notes (Signed)
SATURATION QUALIFICATIONS: (This note is used to comply with regulatory documentation for home oxygen)  Patient Saturations on Room Air at Rest = 93%  Patient Saturations on Room Air while Ambulating = 90%  Patient Saturations on N/A Liters of oxygen while Ambulating = N/A  Please briefly explain why patient needs home oxygen: N/A

## 2022-01-18 NOTE — Progress Notes (Signed)
Mobility Specialist Progress Note   01/18/22 1629  Mobility  Activity Ambulated with assistance in hallway  Level of Assistance Modified independent, requires aide device or extra time  Assistive Device Front wheel walker  Distance Ambulated (ft) 160 ft  Activity Response Tolerated well  $Mobility charge 1 Mobility   Pre Mobility: 84 HR, 93% SpO2 on RA During Mobility: 98, 89% - 92% SpO2 on RA Post Mobility: 81, 94% SpO2 on RA  Received pt in bed on RA having slight chest pain but agreeable to mobility. No physical assist needed throughout and pt able to maintain >90% SpO2 during ambulation in the hallway. Returned back to bed w/o fault, placed call bell in reach and made sure all needs were met.    Holland Falling Mobility Specialist MS [facility] #:  [phone] Acute Rehab Office:  [phone]

## 2022-01-18 NOTE — Progress Notes (Signed)
[facility] PROGRESS NOTE   Christian [name] [id number] DOB: [birth year] DOA: 01/15/2022  PCP: Ladell Pier, MD  Brief History/Interval Summary: 61 y.o. male with medical history significant for eosinophilic asthma on Nucala, allergic rhinitis, OSA on CPAP, hypertension, hyperlipidemia, type 2 diabetes,chronic pain who presented with increasing shortness of breath. Of note he does not wear oxygen at home.  Thought to have acute asthma exacerbation.  Was hospitalized for further management.  Consultants: None yet  Procedures: None  Subjective/Interval History: No acute issues or events overnight, respiratory status markedly improving over the past 24 hours, no overt wheeze even with exertion, continues to have symptoms with exertion but generally improving.  Assessment/Plan:  Acute asthma exacerbation/acute respiratory failure with hypoxia - Patient denies smoking any cigarettes.  He is followed by pulmonologist, Dr. Ander Slade. - Noted to have significant wheezing at the time of admission.   - Initially requiring BiPAP.  Noted to be on nebulizer treatment, Dulera, Singulair, Incruse Ellipta as well as steroids.  -Chest x-ray was unremarkable.  D-dimer was normal.  COVID-19 test was negative.  Influenza was negative. - Advance diet, weaned off BiPAP >24h - wean nasal canula as tolerated - Ambulatory oxygen screen pending  Obstructive sleep apnea Uses CPAP at home infrequently..  Chronic pain syndrome Continue home medications.  Diabetes mellitus type 2 with neuropathy HbA1c 6 months ago was 6.3.  Monitor CBGs.  Anticipate some worsening in his glucose levels due to steroids.  Essential hypertension Occasional high readings noted.  Home medication list has not been reconciled yet but it appears that he could be on lisinopril HCTZ.  Use as needed medications.  Wait for medication reconciliation to be completed.  Hyperlipidemia Continue statin.   DVT Prophylaxis:  Lovenox Code Status: Full code Family Communication: Discussed with patient Disposition Plan: Hopefully return home when improved  Status is: Inpatient Remains inpatient appropriate because: Acute asthma exacerbation, respiratory failure  Medications: Scheduled:  enoxaparin (LOVENOX) injection  40 mg Subcutaneous Q24H   ipratropium-albuterol  3 mL Nebulization Q6H   loratadine  10 mg Oral Daily   melatonin  3 mg Oral QHS   methylPREDNISolone (SOLU-MEDROL) injection  120 mg Intravenous Q24H   mometasone-formoterol  2 puff Inhalation BID   montelukast  10 mg Oral QHS   mouth rinse  15 mL Mouth Rinse 4 times per day   Continuous:  PPI:[id number], hydrALAZINE, mouth rinse  Antibiotics: Anti-infectives (From admission, onward)    None       Objective:  Vital Signs  Vitals:   01/18/22 0126 01/18/22 0452 01/18/22 0802 01/18/22 0803  BP:  (!) 140/87    Pulse:  64 70   Resp:  11 17   Temp:  98.6 F (37 C)    TempSrc:  Oral    SpO2: 98% 94% 100% 100%  Weight:      Height:        Intake/Output Summary (Last 24 hours) at 01/18/2022 0812 Last data filed at 01/18/2022 0454 Gross per 24 hour  Intake 1000 ml  Output 800 ml  Net 200 ml    Filed Weights   01/15/22 0926  Weight: 86.2 kg    General appearance: Awake alert.  In no distress Resp: Mildly tachypneic.  No use of accessory muscles.  Wheezing heard bilaterally.  No definite crackles. Cardio: S1-S2 is normal regular.  No S3-S4.  No rubs murmurs or bruit GI: Abdomen is soft.  Nontender nondistended.  Bowel sounds are present normal.  No  masses organomegaly Extremities: No edema.  Full range of motion of lower extremities. Neurologic: Alert and oriented x3.  No focal neurological deficits.    Lab Results:  Data Reviewed: I have personally reviewed following labs and reports of the imaging studies  CBC: Recent Labs  Lab 01/15/22 0922 01/15/22 1439 01/16/22 0125 01/17/22 0054  WBC 5.9  --  6.7 9.4   NEUTROABS 4.7  --   --   --   HGB 13.7 15.3 13.8 13.5  HCT 40.5 45.0 39.9 41.3  MCV 88.4  --  87.9 89.2  PLT 192  --  184 169     Basic Metabolic Panel: Recent Labs  Lab 01/15/22 0922 01/15/22 1439 01/16/22 0125 01/17/22 0054  NA 139 141 140 140  K 3.4* 3.9 3.8 4.6  CL 106  --  107 108  CO2 24  --  24 23  GLUCOSE 143*  --  174* 129*  BUN 11  --  12 19  CREATININE 1.39*  --  1.37* 1.34*  CALCIUM 9.6  --  9.7 9.2     GFR: Estimated Creatinine Clearance: 59 mL/min (A) (by C-G formula based on SCr of 1.34 mg/dL (H)).    Recent Results (from the past 240 hour(s))  Resp Panel by RT-PCR (Flu A&B, Covid) Anterior Nasal Swab     Status: None   Collection Time: 01/15/22  1:59 PM   Specimen: Anterior Nasal Swab  Result Value Ref Range Status   SARS Coronavirus 2 by RT PCR NEGATIVE NEGATIVE Final    Comment: (NOTE) SARS-CoV-2 target nucleic acids are NOT DETECTED.  The SARS-CoV-2 RNA is generally detectable in upper respiratory specimens during the acute phase of infection. The lowest concentration of SARS-CoV-2 viral copies this assay can detect is 138 copies/mL. A negative result does not preclude SARS-Cov-2 infection and should not be used as the sole basis for treatment or other patient management decisions. A negative result may occur with  improper specimen collection/handling, submission of specimen other than nasopharyngeal swab, presence of viral mutation(s) within the areas targeted by this assay, and inadequate number of viral copies(<138 copies/mL). A negative result must be combined with clinical observations, patient history, and epidemiological information. The expected result is Negative.  Fact Sheet for Patients:  EntrepreneurPulse.com.au  Fact Sheet for Healthcare Providers:  IncredibleEmployment.be  This test is no t yet approved or cleared by the [place] FDA and  has been authorized for detection and/or  diagnosis of SARS-CoV-2 by FDA under an Emergency Use Authorization (EUA). This EUA will remain  in effect (meaning this test can be used) for the duration of the COVID-19 declaration under Section 564(b)(1) of the Act, 21 U.S.C.section 360bbb-3(b)(1), unless the authorization is terminated  or revoked sooner.       Influenza A by PCR NEGATIVE NEGATIVE Final   Influenza B by PCR NEGATIVE NEGATIVE Final    Comment: (NOTE) The Xpert Xpress SARS-CoV-2/FLU/RSV plus assay is intended as an aid in the diagnosis of influenza from Nasopharyngeal swab specimens and should not be used as a sole basis for treatment. Nasal washings and aspirates are unacceptable for Xpert Xpress SARS-CoV-2/FLU/RSV testing.  Fact Sheet for Patients: EntrepreneurPulse.com.au  Fact Sheet for Healthcare Providers: IncredibleEmployment.be  This test is not yet approved or cleared by the [place] FDA and has been authorized for detection and/or diagnosis of SARS-CoV-2 by FDA under an Emergency Use Authorization (EUA). This EUA will remain in effect (meaning this test can be used) for  the duration of the COVID-19 declaration under Section 564(b)(1) of the Act, 21 U.S.C. section 360bbb-3(b)(1), unless the authorization is terminated or revoked.  Performed at [facility], [place] [address], [place], [place] [address]       Radiology Studies: No results found.     LOS: 3 days   Little Ishikawa  [facility] Pager on [email or web address]  01/18/2022, 8:12 AM

## 2022-01-19 DIAGNOSIS — J9601 Acute respiratory failure with hypoxia: Secondary | ICD-10-CM | POA: Diagnosis not present

## 2022-01-19 MED ORDER — METHYLPREDNISOLONE 4 MG PO TBPK: ORAL_TABLET | ORAL | 0 refills | Status: DC

## 2022-01-19 MED ORDER — IPRATROPIUM-ALBUTEROL 0.5-2.5 (3) MG/3ML IN SOLN: 3.0000 mL | Freq: Two times a day (BID) | RESPIRATORY_TRACT | Status: DC

## 2022-01-19 MED ORDER — CETIRIZINE HCL 10 MG PO TABS: 10.0000 mg | ORAL_TABLET | Freq: Every day | ORAL | 2 refills | Status: AC

## 2022-01-19 NOTE — Discharge Summary (Signed)
Physician Discharge Summary  Christian [name] UMP:[id number] DOB: 27-May-1960 DOA: 01/15/2022  PCP: Ladell Pier, MD  Admit date: 01/15/2022 Discharge date: 01/19/2022  Admitted From: Home Disposition: Home  Recommendations for Outpatient Follow-up:  Follow up with PCP in 1-2 weeks Follow-up with pulmonology as scheduled  Home Health: None Equipment/Devices: None  Discharge Condition: Stable CODE STATUS: Full Diet recommendation: Low-salt low-fat diabetic diet  Brief/Interim Summary: 61 y.o. male with medical history significant for eosinophilic asthma on Nucala, allergic rhinitis, OSA on CPAP, hypertension, hyperlipidemia, type 2 diabetes,chronic pain who presented with increasing shortness of breath. Of note he does not wear oxygen at home.  Thought to have acute asthma exacerbation.  Was hospitalized for further management.  Patient presents as above with profound asthma exacerbation initially requiring BiPAP to maintain sats and for supportive care.  Patient quickly weaned to nasal cannula now on room air at rest as well as with exertion with minimal to no symptoms.  Otherwise stable and agreeable for discharge home.  Close follow-up with PCP and pulmonology as scheduled, steroid taper at discharge otherwise no medication changes as outlined below.  Discharge Diagnoses:  Principal Problem:   Acute respiratory failure with hypoxia (HCC) Active Problems:   Essential hypertension   Hyperlipidemia   Diabetes mellitus with neuropathy (HCC)   Chronic pain syndrome   OSA (obstructive sleep apnea)   Severe persistent asthma with acute exacerbation  Acute asthma exacerbation/acute respiratory failure with hypoxia - Patient denies smoking any cigarettes.  He is followed by pulmonologist, Dr. Ander Slade. - Noted to have significant wheezing at the time of admission.   - Initially requiring BiPAP.  Noted to be on nebulizer treatment, Dulera, Singulair, Incruse Ellipta as well as  steroids.  -Chest x-ray was unremarkable.  D-dimer was normal.  COVID-19 test was negative.  Influenza was negative. -Ambulating on room air without hypoxia   Obstructive sleep apnea Uses CPAP at home infrequently -educated on the need for compliance.   Chronic pain syndrome Continue home medications.   Diabetes mellitus type 2 with neuropathy HbA1c 6 months ago was 6.3.  Monitor CBGs.  Anticipate some worsening in his glucose levels due to steroids in the interim but should resolve with tapering.   Essential hypertension Continue home lisinopril HCTZ, is on separate dose of HCTZ as well, blood pressure moderately well controlled here on current regimen, no changes at discharge   Hyperlipidemia Continue statin.  Discharge Instructions   Allergies as of 01/19/2022       Reactions   Phenergan [promethazine Hcl] Itching, Other (See Comments)   burning        Medication List     TAKE these medications    albuterol 108 (90 Base) MCG/ACT inhaler Commonly known as: VENTOLIN HFA 1-2 puffs as needed for wheezing What changed:  how much to take how to take this when to take this reasons to take this additional instructions   aspirin EC 81 MG tablet Take 81 mg by mouth daily. Swallow whole.   Baclofen 5 MG Tabs Take 1 tablet by mouth 2 (two) times daily as needed (Muscle spasms).   Breztri Aerosphere 160-9-4.8 MCG/ACT Aero Generic drug: Budeson-Glycopyrrol-Formoterol Inhale 2 puffs into the lungs in the morning and at bedtime.   cetirizine 10 MG tablet Commonly known as: ZYRTEC Take 1 tablet (10 mg total) by mouth daily.   fluticasone 50 MCG/ACT nasal spray Commonly known as: FLONASE Place 2 sprays into both nostrils daily.   furosemide 20 MG tablet Commonly  known as: LASIX TAKE 1 TABLET BY MOUTH ONCE DAILY FOR  ONE  WEEK  THEN  AS  NEEDED  FOR  SWELLING What changed:  how much to take how to take this when to take this reasons to take this additional  instructions   gabapentin 300 MG capsule Commonly known as: NEURONTIN Take 300 mg by mouth daily.   hydrochlorothiazide 12.5 MG tablet Commonly known as: HYDRODIURIL TAKE 1 TABLET BY MOUTH  DAILY   HYDROcodone-acetaminophen 7.5-325 MG tablet Commonly known as: NORCO Take 1 tablet by mouth 3 (three) times daily as needed for moderate pain or severe pain.   ipratropium-albuterol 0.5-2.5 (3) MG/3ML Soln Commonly known as: DUONEB INHALE 1 VIAL VIA NEBULIZER EVERY 4 HOURS AS NEEDED FOR BREATHING What changed: See the new instructions.   levothyroxine 25 MCG tablet Commonly known as: SYNTHROID Take 25 mcg by mouth every morning.   lisinopril-hydrochlorothiazide 10-12.5 MG tablet Commonly known as: ZESTORETIC Take 1 tablet by mouth daily.   metFORMIN 500 MG 24 hr tablet Commonly known as: GLUCOPHAGE-XR TAKE 1 TABLET BY MOUTH DAILY WITH BREAKFAST   methylPREDNISolone 4 MG Tbpk tablet Commonly known as: MEDROL DOSEPAK Taper as directed   montelukast 10 MG tablet Commonly known as: SINGULAIR Take 1 tablet (10 mg total) by mouth at bedtime.   naloxone 4 MG/0.1ML Liqd nasal spray kit Commonly known as: NARCAN Place 1 spray into the nose once.   Nucala 100 MG/ML Soaj Generic drug: Mepolizumab Inject 1 mL (100 mg total) into the skin every 28 (twenty-eight) days. **patient assistance program**   pravastatin 40 MG tablet Commonly known as: PRAVACHOL Take 1 tablet (40 mg total) by mouth at bedtime.   tadalafil 10 MG tablet Commonly known as: CIALIS Take 10 mg by mouth daily.        Follow-up Information     Ladell Pier, MD Follow up.   Specialty: Internal Medicine Contact information: [address] [place] [address] [phone]                Allergies  Allergen Reactions   Phenergan [Promethazine Hcl] Itching and Other (See Comments)    burning    Consultations: None  Procedures/Studies: DG Chest Portable 1 View  Result  Date: 01/15/2022 CLINICAL DATA:  Shortness of breath EXAM: PORTABLE CHEST 1 VIEW COMPARISON:  Radiograph 12/01/2019 FINDINGS: The cardiomediastinal silhouette is within normal limits. There is no focal airspace disease. There is no pleural effusion. No pneumothorax. There is no acute osseous abnormality. IMPRESSION: No evidence of acute cardiopulmonary disease. Electronically Signed   By: Maurine Simmering M.D.   On: 01/15/2022 16:08     Subjective: No acute issues or events overnight, denies nausea vomiting diarrhea constipation headache fevers chills or chest pain.  Minimal dyspnea with exertion but without hypoxia, no symptoms at rest.   Discharge Exam: Vitals:   01/19/22 0744 01/19/22 0817  BP: (!) 149/103   Pulse: 73   Resp: 15   Temp:    SpO2: 96% 96%   Vitals:   01/19/22 0130 01/19/22 0347 01/19/22 0744 01/19/22 0817  BP:  (!) 173/101 (!) 149/103   Pulse:  63 73   Resp:  14 15   Temp:  98 F (36.7 C)    TempSrc:  Oral    SpO2: 98% 98% 96% 96%  Weight:      Height:        General: Pt is alert, awake, not in acute distress Cardiovascular: RRR,  S1/S2 +, no rubs, no gallops Respiratory: CTA bilaterally, no wheezing, no rhonchi Abdominal: Soft, NT, ND, bowel sounds + Extremities: no edema, no cyanosis    The results of significant diagnostics from this hospitalization (including imaging, microbiology, ancillary and laboratory) are listed below for reference.     Microbiology: Recent Results (from the past 240 hour(s))  Resp Panel by RT-PCR (Flu A&B, Covid) Anterior Nasal Swab     Status: None   Collection Time: 01/15/22  1:59 PM   Specimen: Anterior Nasal Swab  Result Value Ref Range Status   SARS Coronavirus 2 by RT PCR NEGATIVE NEGATIVE Final    Comment: (NOTE) SARS-CoV-2 target nucleic acids are NOT DETECTED.  The SARS-CoV-2 RNA is generally detectable in upper respiratory specimens during the acute phase of infection. The lowest concentration of SARS-CoV-2 viral  copies this assay can detect is 138 copies/mL. A negative result does not preclude SARS-Cov-2 infection and should not be used as the sole basis for treatment or other patient management decisions. A negative result may occur with  improper specimen collection/handling, submission of specimen other than nasopharyngeal swab, presence of viral mutation(s) within the areas targeted by this assay, and inadequate number of viral copies(<138 copies/mL). A negative result must be combined with clinical observations, patient history, and epidemiological information. The expected result is Negative.  Fact Sheet for Patients:  EntrepreneurPulse.com.au  Fact Sheet for Healthcare Providers:  IncredibleEmployment.be  This test is no t yet approved or cleared by the [place] FDA and  has been authorized for detection and/or diagnosis of SARS-CoV-2 by FDA under an Emergency Use Authorization (EUA). This EUA will remain  in effect (meaning this test can be used) for the duration of the COVID-19 declaration under Section 564(b)(1) of the Act, 21 U.S.C.section 360bbb-3(b)(1), unless the authorization is terminated  or revoked sooner.       Influenza A by PCR NEGATIVE NEGATIVE Final   Influenza B by PCR NEGATIVE NEGATIVE Final    Comment: (NOTE) The Xpert Xpress SARS-CoV-2/FLU/RSV plus assay is intended as an aid in the diagnosis of influenza from Nasopharyngeal swab specimens and should not be used as a sole basis for treatment. Nasal washings and aspirates are unacceptable for Xpert Xpress SARS-CoV-2/FLU/RSV testing.  Fact Sheet for Patients: EntrepreneurPulse.com.au  Fact Sheet for Healthcare Providers: IncredibleEmployment.be  This test is not yet approved or cleared by the [place] FDA and has been authorized for detection and/or diagnosis of SARS-CoV-2 by FDA under an Emergency Use Authorization (EUA). This  EUA will remain in effect (meaning this test can be used) for the duration of the COVID-19 declaration under Section 564(b)(1) of the Act, 21 U.S.C. section 360bbb-3(b)(1), unless the authorization is terminated or revoked.  Performed at [facility], [place] [address], [place], [place] [address]      Labs: BNP (last 3 results) No results for input(s): "BNP" in the last 8760 hours. Basic Metabolic Panel: Recent Labs  Lab 01/15/22 0922 01/15/22 1439 01/16/22 0125 01/17/22 0054  NA 139 141 140 140  K 3.4* 3.9 3.8 4.6  CL 106  --  107 108  CO2 24  --  24 23  GLUCOSE 143*  --  174* 129*  BUN 11  --  12 19  CREATININE 1.39*  --  1.37* 1.34*  CALCIUM 9.6  --  9.7 9.2   Liver Function Tests: No results for input(s): "AST", "ALT", "ALKPHOS", "BILITOT", "PROT", "ALBUMIN" in the last 168 hours. No results for input(s): "  LIPASE", "AMYLASE" in the last 168 hours. No results for input(s): "AMMONIA" in the last 168 hours. CBC: Recent Labs  Lab 01/15/22 0922 01/15/22 1439 01/16/22 0125 01/17/22 0054  WBC 5.9  --  6.7 9.4  NEUTROABS 4.7  --   --   --   HGB 13.7 15.3 13.8 13.5  HCT 40.5 45.0 39.9 41.3  MCV 88.4  --  87.9 89.2  PLT 192  --  184 169   Cardiac Enzymes: No results for input(s): "CKTOTAL", "CKMB", "CKMBINDEX", "TROPONINI" in the last 168 hours. BNP: Invalid input(s): "POCBNP" CBG: No results for input(s): "GLUCAP" in the last 168 hours. D-Dimer No results for input(s): "DDIMER" in the last 72 hours. Hgb A1c No results for input(s): "HGBA1C" in the last 72 hours. Lipid Profile No results for input(s): "CHOL", "HDL", "LDLCALC", "TRIG", "CHOLHDL", "LDLDIRECT" in the last 72 hours. Thyroid function studies No results for input(s): "TSH", "T4TOTAL", "T3FREE", "THYROIDAB" in the last 72 hours.  Invalid input(s): "FREET3" Anemia work up No results for input(s): "VITAMINB12", "FOLATE", "FERRITIN", "TIBC", "IRON", "RETICCTPCT" in the last 72 hours. Urinalysis     Component Value Date/Time   BILIRUBINUR negative 07/16/2016 1158   PROTEINUR negatuve 07/16/2016 1158   UROBILINOGEN 0.2 07/16/2016 1158   NITRITE negatuive 07/16/2016 1158   LEUKOCYTESUR Negative 07/16/2016 1158   Sepsis Labs Recent Labs  Lab 01/15/22 0922 01/16/22 0125 01/17/22 0054  WBC 5.9 6.7 9.4   Microbiology Recent Results (from the past 240 hour(s))  Resp Panel by RT-PCR (Flu A&B, Covid) Anterior Nasal Swab     Status: None   Collection Time: 01/15/22  1:59 PM   Specimen: Anterior Nasal Swab  Result Value Ref Range Status   SARS Coronavirus 2 by RT PCR NEGATIVE NEGATIVE Final    Comment: (NOTE) SARS-CoV-2 target nucleic acids are NOT DETECTED.  The SARS-CoV-2 RNA is generally detectable in upper respiratory specimens during the acute phase of infection. The lowest concentration of SARS-CoV-2 viral copies this assay can detect is 138 copies/mL. A negative result does not preclude SARS-Cov-2 infection and should not be used as the sole basis for treatment or other patient management decisions. A negative result may occur with  improper specimen collection/handling, submission of specimen other than nasopharyngeal swab, presence of viral mutation(s) within the areas targeted by this assay, and inadequate number of viral copies(<138 copies/mL). A negative result must be combined with clinical observations, patient history, and epidemiological information. The expected result is Negative.  Fact Sheet for Patients:  EntrepreneurPulse.com.au  Fact Sheet for Healthcare Providers:  IncredibleEmployment.be  This test is no t yet approved or cleared by the [place] FDA and  has been authorized for detection and/or diagnosis of SARS-CoV-2 by FDA under an Emergency Use Authorization (EUA). This EUA will remain  in effect (meaning this test can be used) for the duration of the COVID-19 declaration under Section 564(b)(1) of the Act,  21 U.S.C.section 360bbb-3(b)(1), unless the authorization is terminated  or revoked sooner.       Influenza A by PCR NEGATIVE NEGATIVE Final   Influenza B by PCR NEGATIVE NEGATIVE Final    Comment: (NOTE) The Xpert Xpress SARS-CoV-2/FLU/RSV plus assay is intended as an aid in the diagnosis of influenza from Nasopharyngeal swab specimens and should not be used as a sole basis for treatment. Nasal washings and aspirates are unacceptable for Xpert Xpress SARS-CoV-2/FLU/RSV testing.  Fact Sheet for Patients: EntrepreneurPulse.com.au  Fact Sheet for Healthcare Providers: IncredibleEmployment.be  This test is not yet  approved or cleared by the [place] and has been authorized for detection and/or diagnosis of SARS-CoV-2 by FDA under an Emergency Use Authorization (EUA). This EUA will remain in effect (meaning this test can be used) for the duration of the COVID-19 declaration under Section 564(b)(1) of the Act, 21 U.S.C. section 360bbb-3(b)(1), unless the authorization is terminated or revoked.  Performed at [facility], [place] [address], [place], [place] [address]      Time coordinating discharge: Over 30 minutes  SIGNED:   Little Ishikawa, DO [facility] 01/19/2022, 8:22 AM Pager   If 7PM-7AM, please contact night-coverage [email or web address]

## 2022-01-19 NOTE — TOC Transition Note (Signed)
Transition of Care [facility]) - CM/SW Discharge Note   Patient Details  Name: Christian [name] MRN: [id number] Date of Birth: 06-Feb-1961  [facility] CM/SW Contact:  Bartholomew Crews, RN Phone Number: [phone] 01/19/2022, 9:29 AM   Clinical Narrative:     Patient to transition home today. Chart review. No TOC needs identified at this times.   Final next level of care: Home/Self Care Barriers to Discharge: No Barriers Identified   Patient Goals and CMS Choice        Discharge Placement                       Discharge Plan and Services                                     Social Determinants of Health (SDOH) Interventions     Readmission Risk Interventions     No data to display

## 2022-01-20 ENCOUNTER — Telehealth: Payer: Medicare HMO | Admitting: Internal Medicine

## 2022-01-20 ENCOUNTER — Telehealth: Payer: Self-pay

## 2022-01-20 NOTE — Telephone Encounter (Signed)
Transition Care Management Unsuccessful Follow-up Telephone Call  Date of discharge and from where:  01/19/2022, [facility]  Attempts:  1st Attempt  Reason for unsuccessful TCM follow-up call:  Left voice message on [phone], call back requested

## 2022-01-20 NOTE — Telephone Encounter (Signed)
Call returned to patient and messages left on [phone] and [phone], call back requested.

## 2022-01-20 NOTE — Telephone Encounter (Signed)
Copied from [place] [phone]. Topic: General - Other >> Jan 20, 2022  3:06 PM Sabas Sous wrote: Reason for CRM: Pt wants a call back from Opal Sidles, says it is very important.  Best contact: [phone]

## 2022-01-21 ENCOUNTER — Telehealth: Payer: Self-pay

## 2022-01-21 NOTE — Telephone Encounter (Signed)
Transition Care Management Unsuccessful Follow-up Telephone Call  Date of discharge and from where:  01/19/2022, [facility]  Attempts:  2nd Attempt  Reason for unsuccessful TCM follow-up call:  Left voice message on [phone], call back requested.

## 2022-01-21 NOTE — Telephone Encounter (Signed)
Call returned to patient [phone], message left with call back requested.  Need to confirm if he wants to remove Dr Wynetta Emery as PCP

## 2022-01-21 NOTE — Telephone Encounter (Signed)
Pt would like Christian [name] to call him back.  Pt states he does not go to this office anymore.  And his paperwork sent here by mistake.  But he says he needs to speak w/ Christian [name] to get this resolved. Pt would like call back. [phone]

## 2022-01-22 ENCOUNTER — Telehealth: Payer: Self-pay

## 2022-01-22 NOTE — Telephone Encounter (Signed)
Transition Care Management Follow-up Telephone Call Date of discharge and from where: 01/19/2022, [facility] How have you been since you were released from the hospital? He said he does not see Dr Wynetta Emery any longer.  He now goes to [facility] and has been going there for 7 months. He was upset that Dr Wynetta Emery was still listed as PCP.  I explained to him that I just removed Dr Wynetta Emery as PCP.  He stated he has an appointment at [facility] for follow up next week.

## 2022-01-23 ENCOUNTER — Telehealth: Payer: Self-pay | Admitting: Nurse Practitioner

## 2022-01-23 NOTE — Telephone Encounter (Signed)
ATC LVMTCB x 1  

## 2022-01-23 NOTE — Telephone Encounter (Signed)
Patient is returning phone call. Patient phone number is [phone].

## 2022-01-23 NOTE — Telephone Encounter (Signed)
Patient is returning phone call. Patient phone number is [phone].

## 2022-01-23 NOTE — Telephone Encounter (Signed)
Patient called requesting to speak with a nurse regarding pulmonary rehab. Patient was recently seen at [facility] for shortness of breath. Attempted to schedule an appointment with AO or NP and patient wanting to speak with nurse first.  Please call back at [phone].

## 2022-01-24 NOTE — Telephone Encounter (Signed)
Spoke with pt who states that he has recently been in hospital and feels as though his "lung issues" are getting worse (SOB, dyspnea) Pt would also like to talk about Pulmonary Rehab. Pt scheduled for OV with Katie Cobb on 01/28/22. Nothing further needed at this time.

## 2022-01-24 NOTE — Telephone Encounter (Signed)
ATC LVMTCB x 1  

## 2022-01-28 ENCOUNTER — Encounter: Payer: Self-pay | Admitting: Nurse Practitioner

## 2022-01-28 ENCOUNTER — Ambulatory Visit (INDEPENDENT_AMBULATORY_CARE_PROVIDER_SITE_OTHER): Payer: Medicare HMO | Admitting: Nurse Practitioner

## 2022-01-28 VITALS — BP 126/68 | HR 72 | Temp 98.3°F | Ht 70.0 in | Wt 191.0 lb

## 2022-01-28 DIAGNOSIS — J8283 Eosinophilic asthma: Secondary | ICD-10-CM

## 2022-01-28 DIAGNOSIS — G4733 Obstructive sleep apnea (adult) (pediatric): Secondary | ICD-10-CM

## 2022-01-28 MED ORDER — INCRUSE ELLIPTA 62.5 MCG/ACT IN AEPB: 1.0000 | INHALATION_SPRAY | Freq: Every day | RESPIRATORY_TRACT | 5 refills | Status: DC

## 2022-01-28 MED ORDER — MOMETASONE FURO-FORMOTEROL FUM 200-5 MCG/ACT IN AERO: 2.0000 | INHALATION_SPRAY | Freq: Two times a day (BID) | RESPIRATORY_TRACT | 5 refills | Status: DC

## 2022-01-28 NOTE — Patient Instructions (Addendum)
Continue Dulera 2 puffs Twice daily. Brush tongue and rinse mouth afterwards. Step up Dulera to 200 mcg - new prescription sent to your pharmacy  Add Incruse 1 puff daily  Continue Albuterol inhaler 2 puffs or 3 mL neb every 6 hours as needed for shortness of breath or wheezing. Notify if symptoms persist despite rescue inhaler/neb use.  Continue Nucala injection as scheduled  Continue Zyrtec 1 tab daily for allergies Continue flonase 2 sprays each nostril daily  Continue singulair 1 tab daily   Continue CPAP nightly for minimum of 4-6 hours a night   Follow up in 4-6 weeks with Dr. Ander Slade or Katie Darriel Sinquefield,NP. If symptoms do not improve or worsen, please contact office for sooner follow up or seek emergency care.

## 2022-01-28 NOTE — Progress Notes (Signed)
$'@Patient'm$  ID: Christian [name], male    DOB: August 10, 1960, 61 y.o.   MRN: [id number]  Chief Complaint  Patient presents with   Hospitalization Follow-up    Pt is here for hospital follow up for SOB. Pt was admitted on 9/20 and discharged 9/24. Pt states he kept getting SOB frequently and he ended up got to the hospital. Pt was discharge home on dulera. Pt states while in hospital he was placed on bipap    Referring provider: No ref. provider found  HPI: 61 year old male, former remote smoker followed for eosinophilic asthma on Nucala and OSA intolerant of CPAP. He is a patient of Dr. Judson Roch and last seen in office on 12/06/2021 by [facility] NP. Past medical history significant for HTN, HLD, allergic rhinitis, DM, OA.   TEST/EVENTS:  05/25/2019 PFTs: FVC 86, FEV1 75, ratio 74, TLC 85, DLCOunc 89. Positive BD 09/29/2019 CXR 2 view: lungs clear 01/15/2022 CXR: the lungs are clear without acute process  03/01/2021: OV with Dr. Ander Slade. Encouraged to restart CPAP; plans to try different mask. Asthma stable. Continue Nucala and Breztri.   12/06/2021: OV with Avana Kreiser NP for follow up. He reports that he has been doing well. His breathing is stable and he has not required any antibiotics or prednisone in over a year. Occasionally has some chest tightness but this resolves with duoneb, which he uses a few times a week. Continues on [place] injections. He denies any cough, wheezing, orthopnea, leg swelling.  He is not wearing his CPAP currently. He doesn't like the full face mask he has and would like to see about switching. He denies morning headaches or drowsy driving. Snores at night. Denies witness apneas. Noncompliant with use due to mask intolerance. Recommended he go for mask desensitization study - referral placed today. Encouraged him on CPAP use. We discussed how untreated sleep apnea puts an individual at risk for cardiac arrhthymias, pulm HTN, DM, stroke and increases their risk for daytime  accidents  01/28/2022: Today - hospital follow up Patient presents today for hospital follow-up.  He was admitted from 01/15/2022 to 01/19/2022 for severe asthma exacerbation with acute respiratory failure requiring BiPAP therapy.  He was treated with IV steroids and bronchodilators.  Recovered well and was able to be discharged home on room air.  Upon discharge, he was changed from [place] to [place] and Incruse. Today, he reports feeling better. He just wants to make sure that he's on everything he needs to be on. He doesn't want to end up back at the hospital. He feels like his breathing is stable; no significant shortness of breath. He does have some mild wheezing first thing when he wakes up that he uses his albuterol neb for; otherwise, no wheezing throughout the day. Denies any cough, chest congestion, lower extremity swelling, fevers, chills. He is currently on Dulera 100; never received the Incruse. He's taking singulair and zyrtec daily. He receives Nucala injections. Prior to this, he hadn't had an asthma exacerbation in over a year.   Allergies  Allergen Reactions   Phenergan [Promethazine Hcl] Itching and Other (See Comments)    burning    Immunization History  Administered Date(s) Administered   Influenza Inj Mdck Quad Pf 05/29/2018   Influenza Split 02/23/2016   Influenza,inj,Quad PF,6+ Mos 12/22/2016, 02/10/2019, 01/29/2021   Influenza-Unspecified 02/07/2019   PFIZER(Purple Top)SARS-COV-2 Vaccination 07/14/2019, 08/10/2019   Pneumococcal Conjugate-13 03/25/2016   Pneumococcal Polysaccharide-23 07/16/2016   Tdap 04/02/2016   Zoster Recombinat (Shingrix)  02/13/2021, 04/15/2021    Past Medical History:  Diagnosis Date   Allergy    Arthritis    "left knee" (01/22/2016)   Asthma    Chronic lower back pain    COPD (chronic obstructive pulmonary disease) ([place])    Dyspnea    with extertion   High cholesterol    Hypertension    Neuropathy    OSA on CPAP    Secondhand smoke  exposure    Sleep apnea    wears CPAP, not wearing now  for months   Type II diabetes mellitus ([place]) dx'd 01/17/2016    Tobacco History: Social History   Tobacco Use  Smoking Status Never   Passive exposure: Yes  Smokeless Tobacco Never  Tobacco Comments   passive cig smoking at work  1994- 2012   Counseling given: Not Answered Tobacco comments: passive cig smoking at work  1994- 2012   Outpatient Medications Prior to Visit  Medication Sig Dispense Refill   albuterol (VENTOLIN HFA) 108 (90 Base) MCG/ACT inhaler 1-2 puffs as needed for wheezing (Patient taking differently: Inhale 1-2 puffs into the lungs every 6 (six) hours as needed for wheezing.) 18 g 4   aspirin EC 81 MG tablet Take 81 mg by mouth daily. Swallow whole.     Baclofen 5 MG TABS Take 1 tablet by mouth 2 (two) times daily as needed (Muscle spasms).     cetirizine (ZYRTEC) 10 MG tablet Take 1 tablet (10 mg total) by mouth daily. 90 tablet 2   fluticasone (FLONASE) 50 MCG/ACT nasal spray Place 2 sprays into both nostrils daily. 16 g 6   furosemide (LASIX) 20 MG tablet TAKE 1 TABLET BY MOUTH ONCE DAILY FOR  ONE  WEEK  THEN  AS  NEEDED  FOR  SWELLING (Patient taking differently: Take 20 mg by mouth daily as needed for edema.) 30 tablet 3   gabapentin (NEURONTIN) 300 MG capsule Take 300 mg by mouth daily.     hydrochlorothiazide (HYDRODIURIL) 12.5 MG tablet TAKE 1 TABLET BY MOUTH  DAILY (Patient taking differently: Take 12.5 mg by mouth daily.) 30 tablet 11   HYDROcodone-acetaminophen (NORCO) 7.5-325 MG tablet Take 1 tablet by mouth 3 (three) times daily as needed for moderate pain or severe pain.     ipratropium-albuterol (DUONEB) 0.5-2.5 (3) MG/3ML SOLN INHALE 1 VIAL VIA NEBULIZER EVERY 4 HOURS AS NEEDED FOR BREATHING (Patient taking differently: Take 3 mLs by nebulization every 4 (four) hours as needed (For breathing).) 180 mL 10   levothyroxine (SYNTHROID) 25 MCG tablet Take 25 mcg by mouth every morning.      lisinopril-hydrochlorothiazide (ZESTORETIC) 10-12.5 MG tablet Take 1 tablet by mouth daily. 30 tablet 11   Mepolizumab (NUCALA) 100 MG/ML SOAJ Inject 1 mL (100 mg total) into the skin every 28 (twenty-eight) days. **patient assistance program** 1 mL 2   metFORMIN (GLUCOPHAGE-XR) 500 MG 24 hr tablet TAKE 1 TABLET BY MOUTH DAILY WITH BREAKFAST 30 tablet 0   montelukast (SINGULAIR) 10 MG tablet Take 1 tablet (10 mg total) by mouth at bedtime. 30 tablet 9   naloxone (NARCAN) nasal spray 4 mg/0.1 mL Place 1 spray into the nose once.     pravastatin (PRAVACHOL) 40 MG tablet Take 1 tablet (40 mg total) by mouth at bedtime. 90 tablet 1   tadalafil (CIALIS) 10 MG tablet Take 10 mg by mouth daily.     mometasone-formoterol (DULERA) 100-5 MCG/ACT AERO Inhale 2 puffs into the lungs 2 (two) times daily.  Budeson-Glycopyrrol-Formoterol (BREZTRI AEROSPHERE) 160-9-4.8 MCG/ACT AERO Inhale 2 puffs into the lungs in the morning and at bedtime. (Patient not taking: Reported on 01/28/2022) 32.1 g 3   methylPREDNISolone (MEDROL DOSEPAK) 4 MG TBPK tablet Taper as directed 1 each 0   No facility-administered medications prior to visit.     Review of Systems:   Constitutional: No weight loss or gain, night sweats, fevers, chills, fatigue, or lassitude. HEENT: No headaches, difficulty swallowing, tooth/dental problems, or sore throat. No sneezing, itching, ear ache, nasal congestion, or post nasal drip CV:  No chest pain, orthopnea, PND, swelling in lower extremities, anasarca, dizziness, palpitations, syncope Resp: +chest tightness/wheezing in AM (improving); shortness of breath with strenuous activity (stable). No excess mucus or change in color of mucus. No productive or non-productive. No hemoptysis. No wheezing.  No chest wall deformity Skin: No rash, lesions, ulcerations MSK:  No joint pain or swelling.  No decreased range of motion.  No back pain. Neuro: No dizziness or lightheadedness.  Psych: No depression  or anxiety. Mood stable.     Physical Exam:  BP 126/68 (BP Location: Left Arm, Patient Position: Sitting, Cuff Size: Normal)   Pulse 72   Temp 98.3 F (36.8 C) (Oral)   Ht '5\' 10"'$  (1.778 m)   Wt 191 lb (86.6 kg)   SpO2 97%   BMI 27.41 kg/m   GEN: Pleasant, interactive, well-appearing; in no acute distress. HEENT:  Normocephalic and atraumatic. PERRLA. Sclera white. Nasal turbinates pink, moist and patent bilaterally. No rhinorrhea present. Oropharynx pink and moist, without exudate or edema. No lesions, ulcerations, or postnasal drip.  NECK:  Supple w/ fair ROM. No JVD present. Normal carotid impulses w/o bruits. Thyroid symmetrical with no goiter or nodules palpated. No lymphadenopathy.   CV: RRR, no m/r/g, no peripheral edema. Pulses intact, +2 bilaterally. No cyanosis, pallor or clubbing. PULMONARY:  Unlabored, regular breathing. Clear bilaterally A&P w/o wheezes/rales/rhonchi. No accessory muscle use. No dullness to percussion. GI: BS present and normoactive. Soft, non-tender to palpation. No organomegaly or masses detected.  MSK: No erythema, warmth or tenderness. No deformities or joint swelling noted.  Neuro: A/Ox3. No focal deficits noted.   Skin: Warm, no lesions or rashe Psych: Normal affect and behavior. Judgement and thought content appropriate.     Lab Results:  CBC    Component Value Date/Time   WBC 9.4 01/17/2022 0054   RBC 4.63 01/17/2022 0054   HGB 13.5 01/17/2022 0054   HGB 14.9 01/29/2021 1109   HCT 41.3 01/17/2022 0054   HCT 45.0 01/29/2021 1109   PLT 169 01/17/2022 0054   PLT 192 01/29/2021 1109   MCV 89.2 01/17/2022 0054   MCV 90 01/29/2021 1109   MCH 29.2 01/17/2022 0054   MCHC 32.7 01/17/2022 0054   RDW 14.4 01/17/2022 0054   RDW 13.8 01/29/2021 1109   LYMPHSABS 0.8 01/15/2022 0922   LYMPHSABS 2.1 08/19/2018 1441   MONOABS 0.4 01/15/2022 0922   EOSABS 0.0 01/15/2022 0922   EOSABS 0.5 (H) 08/19/2018 1441   BASOSABS 0.0 01/15/2022 0922    BASOSABS 0.1 08/19/2018 1441    BMET    Component Value Date/Time   NA 140 01/17/2022 0054   NA 143 01/29/2021 1109   K 4.6 01/17/2022 0054   CL 108 01/17/2022 0054   CO2 23 01/17/2022 0054   GLUCOSE 129 (H) 01/17/2022 0054   BUN 19 01/17/2022 0054   BUN 15 01/29/2021 1109   CREATININE 1.34 (H) 01/17/2022 0054   CREATININE 1.09  01/14/2016 1523   CALCIUM 9.2 01/17/2022 0054   GFRNONAA 60 (L) 01/17/2022 0054   GFRNONAA 75 01/14/2016 1523   GFRAA 87 12/15/2019 1227   GFRAA 87 01/14/2016 1523    BNP    Component Value Date/Time   BNP 13.9 12/27/2019 1336     Imaging:  DG Chest Portable 1 View  Result Date: 01/15/2022 CLINICAL DATA:  Shortness of breath EXAM: PORTABLE CHEST 1 VIEW COMPARISON:  Radiograph 12/01/2019 FINDINGS: The cardiomediastinal silhouette is within normal limits. There is no focal airspace disease. There is no pleural effusion. No pneumothorax. There is no acute osseous abnormality. IMPRESSION: No evidence of acute cardiopulmonary disease. Electronically Signed   By: Maurine Simmering M.D.   On: 01/15/2022 16:08         Latest Ref Rng & Units 05/25/2019   11:52 AM 04/05/2019    8:57 AM 05/07/2016   11:55 AM  PFT Results  FVC-Pre L  3.22  P 3.11   FVC-Predicted Pre % 86  86  P 82   FVC-Post L 3.33  3.33  P 2.46   FVC-Predicted Post % 89  89  P 64   Pre FEV1/FVC % % 68  68  P 58   Post FEV1/FCV % % 74  74  P 70   FEV1-Pre L 2.20  2.20  P 1.79   FEV1-Predicted Pre % 75  75  P 60   FEV1-Post L 2.46  2.46  P 1.73   DLCO uncorrected ml/min/mmHg 23.44  23.44  P 22.17   DLCO UNC% % 89  89  P 74   DLCO corrected ml/min/mmHg   20.65   DLCO COR %Predicted %   69   DLVA Predicted % 106  106  P 108   TLC L 5.63  5.63  P 5.42   TLC % Predicted % 85  85  P 82   RV % Predicted % 95  95  P 103     P Preliminary result    No results found for: "NITRICOXIDE"      Assessment & Plan:   Eosinophilic asthma Severe eosinophilic asthma with recent exacerbation  requiring hospitalization and BiPAP.  He has recovered well since discharge. Prior to this, he had been doing quite well from an asthma standpoint. He is currently on Dulera 100.  Given his recent exacerbation and AM symptoms, recommended stepping him up to high-dose with Dulera 200.  We will also add on Incruse for triple therapy regimen.  Advised him to continue Nucala.  If he has any further exacerbations, may need to consider changing Biologics.  Patient Instructions  Continue Dulera 2 puffs Twice daily. Brush tongue and rinse mouth afterwards. Step up Dulera to 200 mcg - new prescription sent to your pharmacy  Add Incruse 1 puff daily  Continue Albuterol inhaler 2 puffs or 3 mL neb every 6 hours as needed for shortness of breath or wheezing. Notify if symptoms persist despite rescue inhaler/neb use.  Continue Nucala injection as scheduled  Continue Zyrtec 1 tab daily for allergies Continue flonase 2 sprays each nostril daily  Continue singulair 1 tab daily   Continue CPAP nightly for minimum of 4-6 hours a night   Follow up in 4-6 weeks with Dr. Ander Slade or Katie Chiamaka Latka,NP. If symptoms do not improve or worsen, please contact office for sooner follow up or seek emergency care.     OSA (obstructive sleep apnea) Mild OSA with AHI 12.1. He has been inconsistent  with CPAP use. Admits nightly usage since being discharged. He does receive good benefit from use. We will check download at his follow up to ensure he is well-controlled. Unable to review today. Cautioned to be aware of and avoid drowsy driving.     I spent 35 minutes of dedicated to the care of this patient on the date of this encounter to include pre-visit review of records, face-to-face time with the patient discussing conditions above, post visit ordering of testing, clinical documentation with the electronic health record, making appropriate referrals as documented, and communicating necessary findings to members of the patients  care team.  Clayton Bibles, NP 01/28/2022  Pt aware and understands NP's role.

## 2022-01-28 NOTE — Assessment & Plan Note (Addendum)
Severe eosinophilic asthma with recent exacerbation requiring hospitalization and BiPAP.  He has recovered well since discharge. Prior to this, he had been doing quite well from an asthma standpoint. He is currently on Dulera 100.  Given his recent exacerbation and AM symptoms, recommended stepping him up to high-dose with Dulera 200.  We will also add on Incruse for triple therapy regimen.  Advised him to continue Nucala.  If he has any further exacerbations, may need to consider changing Biologics.  Patient Instructions  Continue Dulera 2 puffs Twice daily. Brush tongue and rinse mouth afterwards. Step up Dulera to 200 mcg - new prescription sent to your pharmacy  Add Incruse 1 puff daily  Continue Albuterol inhaler 2 puffs or 3 mL neb every 6 hours as needed for shortness of breath or wheezing. Notify if symptoms persist despite rescue inhaler/neb use.  Continue Nucala injection as scheduled  Continue Zyrtec 1 tab daily for allergies Continue flonase 2 sprays each nostril daily  Continue singulair 1 tab daily   Continue CPAP nightly for minimum of 4-6 hours a night   Follow up in 4-6 weeks with Dr. Ander Slade or Katie [place] Mcphee,NP. If symptoms do not improve or worsen, please contact office for sooner follow up or seek emergency care.

## 2022-01-28 NOTE — Assessment & Plan Note (Signed)
Mild OSA with AHI 12.1. He has been inconsistent with CPAP use. Admits nightly usage since being discharged. He does receive good benefit from use. We will check download at his follow up to ensure he is well-controlled. Unable to review today. Cautioned to be aware of and avoid drowsy driving.

## 2022-01-30 ENCOUNTER — Other Ambulatory Visit (HOSPITAL_COMMUNITY): Payer: Self-pay

## 2022-01-30 ENCOUNTER — Telehealth: Payer: Self-pay | Admitting: Nurse Practitioner

## 2022-01-30 NOTE — Telephone Encounter (Signed)
Per test claims, Ruthe Mannan is non-formulary and other ICS+LABA (Advair Diskus, Breo, and Symbicort) show duplicate claim due to a fill on 01-15-2022 so unable to tell if covered.

## 2022-01-30 NOTE — Telephone Encounter (Signed)
Fax received from pt's pharmacy stating that [facility] 200 is not covered by pt's insurance. Routing to both prior auth team for review as well as Katie. Please advise.

## 2022-01-31 NOTE — Telephone Encounter (Signed)
Please change to Symbicort 160 2 puffs Twice daily. Have him let us know if there are coverage issues still. Thanks.

## 2022-01-31 NOTE — Telephone Encounter (Signed)
ATC LVMTCB x 1  

## 2022-02-03 NOTE — Telephone Encounter (Signed)
Patient returning a call. °

## 2022-02-03 NOTE — Telephone Encounter (Signed)
Attempted to call pt but unable to reach. Left message for pt to call office back to let us know what inhaler/s he takes everyday as his maintenance inhaler.

## 2022-02-03 NOTE — Telephone Encounter (Signed)
Called and spoke with pt letting him know the info from [place] about switching him to Symbicort 160 instead of Dulera 200 as the Dulera 200 was not covered by insurance. When stating this to pt, pt stated that he could not remember what his everyday inhaler was that he currently had as he was not at home. Stated to pt to call us when he did get home so we could know what he has been using for his everyday inhaler and he stated that he would.  After speaking with pt further, he mentioned that he believes his everyday inhaler that he currently has is Librarian, academic and I stated to pt that if Judithann Sauger is what he currently has at home, that would be fine but he said that he would definitely call us when he got home to let us know what it is that he uses everyday. Will await a return call from pt.

## 2022-02-06 NOTE — Telephone Encounter (Signed)
If pt is on [place], he is not to take the [facility] with the Breztri due to [place] having three different medications in 1. Pt can still use the Albuterol inhaler as needed for wheezing, SOB, or chest tightness.   Attempted to call pt but unable to reach.left message for him to return call.

## 2022-02-06 NOTE — Telephone Encounter (Signed)
Patient is returning phone call. Patient states currently taking Samuel [place] and Albuterol. Patient phone number is [phone].

## 2022-02-06 NOTE — Telephone Encounter (Signed)
ATC patient. LVMTCB. 

## 2022-02-06 NOTE — Telephone Encounter (Signed)
Patient returning call- please call [phone]

## 2022-02-10 ENCOUNTER — Other Ambulatory Visit: Payer: Self-pay | Admitting: Pulmonary Disease

## 2022-02-10 DIAGNOSIS — J8283 Eosinophilic asthma: Secondary | ICD-10-CM

## 2022-02-19 ENCOUNTER — Telehealth: Payer: Self-pay | Admitting: Nurse Practitioner

## 2022-02-20 NOTE — Telephone Encounter (Signed)
Nucala refills faxed to [facility] [place]  Fax: [phone] Phone: [phone]  Knox Saliva, PharmD, MPH, BCPS, CPP Clinical Pharmacist (Rheumatology and Pulmonology)

## 2022-02-21 ENCOUNTER — Telehealth: Payer: Self-pay | Admitting: Nurse Practitioner

## 2022-02-21 NOTE — Telephone Encounter (Signed)
Called and spoke with patient. He wanted to know if he could finish the [place] before switching over to Symbicort and Incruse. I advised him that it would be ok for him to do so as long as he is not using all 3 at once. He verbalized understanding.   Nothing further needed at time of call.

## 2022-02-25 ENCOUNTER — Ambulatory Visit: Payer: Medicare HMO | Admitting: Pulmonary Disease

## 2022-02-26 ENCOUNTER — Telehealth: Payer: Self-pay | Admitting: Pulmonary Disease

## 2022-02-26 NOTE — Telephone Encounter (Signed)
Called patient and advised that refill has been sent again to pharmacy.  Knox Saliva, PharmD, MPH, BCPS, CPP Clinical Pharmacist (Rheumatology and Pulmonology)

## 2022-02-26 NOTE — Telephone Encounter (Signed)
Pt called office stating he tried to get a refill of his nucala but said that something was left off of the Rx so  he was not able to get a refill. Please advise on this.

## 2022-02-26 NOTE — Telephone Encounter (Signed)
Rx for Nucala sent to [facility] [place] form faxed again to [facility] [place]  Fax: [phone]  Knox Saliva, PharmD, MPH, BCPS, CPP Clinical Pharmacist (Rheumatology and Pulmonology)

## 2022-03-04 ENCOUNTER — Telehealth: Payer: Self-pay | Admitting: Pulmonary Disease

## 2022-03-05 NOTE — Telephone Encounter (Signed)
Contacted [facility] [place] who then routed me to correct [facility] phone number, missing DEA number provided. Per rep that is all that is missing and they will reach out to the pt to schedule delivery. Nothing further required at this time.

## 2022-03-06 NOTE — Telephone Encounter (Signed)
Spoke with patient and advised that he can reach out to schedule shipment if pharmacy has not yet reached out. Also advised him to request renewal application for [id number] enrollment  Knox Saliva, PharmD, MPH, BCPS, CPP Clinical Pharmacist (Rheumatology and Pulmonology)

## 2022-03-11 ENCOUNTER — Other Ambulatory Visit: Payer: Self-pay | Admitting: Pulmonary Disease

## 2022-03-11 DIAGNOSIS — J8283 Eosinophilic asthma: Secondary | ICD-10-CM

## 2022-03-12 NOTE — Telephone Encounter (Signed)
Ordered 4 weeks, with 10 additional refills attached

## 2022-03-31 ENCOUNTER — Telehealth: Payer: Self-pay

## 2022-03-31 NOTE — Telephone Encounter (Signed)
ATC pt and LVM regarding PAP re-enrollment for calendar year 2024. Informed pt that I would be placing renewal application in the mail and requested that they contact me directly if with any questions or concerns. Direct office number provided, will await f/u.  Provider portion will be placed in Dr. Judson Roch box for signature. Placed page 4 and copies of pt and provider portion in Awaiting response folder.

## 2022-04-07 NOTE — Telephone Encounter (Signed)
Provider portion received, will continue to await return of pt portion.

## 2022-04-09 NOTE — Telephone Encounter (Signed)
Submitted Patient Assistance Application to [facility] [place] for [name] along with provider portion, PA and income documents. Will update patient when we receive a response.  Fax# [phone] Phone# [phone]

## 2022-04-10 NOTE — Telephone Encounter (Signed)
Received fax from [place] to [place] with confirmation of receipt of enrollment form  Knox Saliva, PharmD, MPH, BCPS, CPP Clinical Pharmacist (Rheumatology and Pulmonology)

## 2022-05-07 ENCOUNTER — Ambulatory Visit: Payer: Self-pay | Admitting: General Surgery

## 2022-05-07 NOTE — Telephone Encounter (Addendum)
Received a fax from  [place] regarding an approval for [place] patient assistance from 05/07/2022 to 04/28/2023. Approval letter sent to scan center.  Phone number: [phone]  ATC patient to advise of approval. Unable to reach. Left VM.  Knox Saliva, PharmD, MPH, BCPS, CPP Clinical Pharmacist (Rheumatology and Pulmonology)

## 2022-06-02 ENCOUNTER — Other Ambulatory Visit: Payer: Self-pay

## 2022-06-02 ENCOUNTER — Encounter (HOSPITAL_BASED_OUTPATIENT_CLINIC_OR_DEPARTMENT_OTHER): Payer: Self-pay | Admitting: General Surgery

## 2022-06-03 ENCOUNTER — Encounter (HOSPITAL_BASED_OUTPATIENT_CLINIC_OR_DEPARTMENT_OTHER)
Admission: RE | Admit: 2022-06-03 | Discharge: 2022-06-03 | Disposition: A | Discharge status: Home / Self Care | Payer: Medicare HMO | Source: Ambulatory Visit | Attending: General Surgery | Admitting: General Surgery

## 2022-06-03 DIAGNOSIS — Z01818 Encounter for other preprocedural examination: Secondary | ICD-10-CM | POA: Insufficient documentation

## 2022-06-03 LAB — BASIC METABOLIC PANEL
Anion gap: 9 (ref 5–15)
BUN: 9 mg/dL (ref 8–23)
CO2: 26 mmol/L (ref 22–32)
Calcium: 8.8 mg/dL — ABNORMAL LOW (ref 8.9–10.3)
Chloride: 104 mmol/L (ref 98–111)
Creatinine, Ser: 1.28 mg/dL — ABNORMAL HIGH (ref 0.61–1.24)
GFR, Estimated: >60 mL/min (ref >60)
Glucose, Bld: 109 mg/dL — ABNORMAL HIGH (ref 70–99)
Potassium: 4.4 mmol/L (ref 3.5–5.1)
Sodium: 139 mmol/L (ref 135–145)

## 2022-06-03 NOTE — Progress Notes (Signed)
       Patient Instructions  The night before surgery:  No food after midnight. ONLY clear liquids after midnight  The day of surgery (if you do NOT have diabetes):  Drink ONE (1) Pre-Surgery Clear Ensure as directed.   This drink was given to you during your hospital  pre-op appointment visit. The pre-op nurse will instruct you on the time to drink the  Pre-Surgery Ensure depending on your surgery time. Finish the drink at the designated time by the pre-op nurse.  Nothing else to drink after completing the  Pre-Surgery Clear Ensure.  The day of surgery (if you have diabetes): Drink ONE (1) Gatorade 2 (G2) as directed. This drink was given to you during your hospital  pre-op appointment visit.  The pre-op nurse will instruct you on the time to drink the   Gatorade 2 (G2) depending on your surgery time. Color of the Gatorade may vary. Red is not allowed. Nothing else to drink after completing the  Gatorade 2 (G2).         If you have questions, please contact your surgeon's office.Patient was provided with CHG cleanser to use at home before the procedure. Patient verbalized understanding of instructions.Patient was provided with CHG cleanser to use at home before the procedure. Patient verbalized understanding of instructions.

## 2022-06-09 ENCOUNTER — Encounter (HOSPITAL_BASED_OUTPATIENT_CLINIC_OR_DEPARTMENT_OTHER)
Admission: RE | Disposition: A | Discharge status: Home / Self Care | Payer: Self-pay | Source: Ambulatory Visit | Attending: General Surgery

## 2022-06-09 ENCOUNTER — Ambulatory Visit (HOSPITAL_BASED_OUTPATIENT_CLINIC_OR_DEPARTMENT_OTHER): Payer: Medicare HMO | Admitting: Anesthesiology

## 2022-06-09 ENCOUNTER — Ambulatory Visit (HOSPITAL_BASED_OUTPATIENT_CLINIC_OR_DEPARTMENT_OTHER)
Admission: RE | Admit: 2022-06-09 | Discharge: 2022-06-09 | Disposition: A | Discharge status: Home / Self Care | Payer: Medicare HMO | Source: Ambulatory Visit | Attending: General Surgery | Admitting: General Surgery

## 2022-06-09 ENCOUNTER — Encounter (HOSPITAL_BASED_OUTPATIENT_CLINIC_OR_DEPARTMENT_OTHER): Payer: Self-pay | Admitting: General Surgery

## 2022-06-09 ENCOUNTER — Other Ambulatory Visit: Payer: Self-pay

## 2022-06-09 DIAGNOSIS — I10 Essential (primary) hypertension: Secondary | ICD-10-CM | POA: Diagnosis not present

## 2022-06-09 DIAGNOSIS — G4733 Obstructive sleep apnea (adult) (pediatric): Secondary | ICD-10-CM | POA: Diagnosis not present

## 2022-06-09 DIAGNOSIS — J449 Chronic obstructive pulmonary disease, unspecified: Secondary | ICD-10-CM | POA: Insufficient documentation

## 2022-06-09 DIAGNOSIS — Z7984 Long term (current) use of oral hypoglycemic drugs: Secondary | ICD-10-CM | POA: Diagnosis not present

## 2022-06-09 DIAGNOSIS — E119 Type 2 diabetes mellitus without complications: Secondary | ICD-10-CM

## 2022-06-09 DIAGNOSIS — K429 Umbilical hernia without obstruction or gangrene: Secondary | ICD-10-CM

## 2022-06-09 DIAGNOSIS — M1990 Unspecified osteoarthritis, unspecified site: Secondary | ICD-10-CM | POA: Insufficient documentation

## 2022-06-09 DIAGNOSIS — G4730 Sleep apnea, unspecified: Secondary | ICD-10-CM | POA: Diagnosis not present

## 2022-06-09 DIAGNOSIS — E1140 Type 2 diabetes mellitus with diabetic neuropathy, unspecified: Secondary | ICD-10-CM

## 2022-06-09 HISTORY — PX: UMBILICAL HERNIA REPAIR: SHX196

## 2022-06-09 HISTORY — PX: INSERTION OF MESH: SHX5868

## 2022-06-09 LAB — GLUCOSE, CAPILLARY
Glucose-Capillary: 119 mg/dL — ABNORMAL HIGH (ref 70–99)
Glucose-Capillary: 129 mg/dL — ABNORMAL HIGH (ref 70–99)

## 2022-06-09 SURGERY — REPAIR, HERNIA, UMBILICAL, ADULT
Anesthesia: General | Site: Abdomen

## 2022-06-09 MED ORDER — ONDANSETRON HCL 4 MG/2ML IJ SOLN
INTRAMUSCULAR | Status: DC | PRN
  Administered 2022-06-09: 4 mg via INTRAVENOUS

## 2022-06-09 MED ORDER — OXYCODONE HCL 5 MG PO TABS
ORAL_TABLET | ORAL | Status: AC
  Filled 2022-06-09: qty 1

## 2022-06-09 MED ORDER — ONDANSETRON HCL 4 MG/2ML IJ SOLN
INTRAMUSCULAR | Status: AC
  Filled 2022-06-09: qty 2

## 2022-06-09 MED ORDER — FENTANYL CITRATE (PF) 100 MCG/2ML IJ SOLN
INTRAMUSCULAR | Status: AC
  Filled 2022-06-09: qty 2

## 2022-06-09 MED ORDER — SUGAMMADEX SODIUM 200 MG/2ML IV SOLN
INTRAVENOUS | Status: DC | PRN
  Administered 2022-06-09: 200 mg via INTRAVENOUS

## 2022-06-09 MED ORDER — ONDANSETRON HCL 4 MG/2ML IJ SOLN: 4.0000 mg | Freq: Once | INTRAMUSCULAR | Status: DC | PRN

## 2022-06-09 MED ORDER — CHLORHEXIDINE GLUCONATE CLOTH 2 % EX PADS: 6.0000 | MEDICATED_PAD | Freq: Once | CUTANEOUS | Status: DC

## 2022-06-09 MED ORDER — DEXAMETHASONE SODIUM PHOSPHATE 4 MG/ML IJ SOLN
INTRAMUSCULAR | Status: DC | PRN
  Administered 2022-06-09: 5 mg via INTRAVENOUS

## 2022-06-09 MED ORDER — LACTATED RINGERS IV SOLN: INTRAVENOUS | Status: DC

## 2022-06-09 MED ORDER — LIDOCAINE 2% (20 MG/ML) 5 ML SYRINGE
INTRAMUSCULAR | Status: AC
  Filled 2022-06-09: qty 5

## 2022-06-09 MED ORDER — DEXMEDETOMIDINE HCL IN NACL 80 MCG/20ML IV SOLN
INTRAVENOUS | Status: AC
  Filled 2022-06-09: qty 20

## 2022-06-09 MED ORDER — DEXMEDETOMIDINE HCL IN NACL 80 MCG/20ML IV SOLN
INTRAVENOUS | Status: DC | PRN
  Administered 2022-06-09: 8 ug via BUCCAL

## 2022-06-09 MED ORDER — ROCURONIUM BROMIDE 100 MG/10ML IV SOLN
INTRAVENOUS | Status: DC | PRN
  Administered 2022-06-09: 80 mg via INTRAVENOUS

## 2022-06-09 MED ORDER — KETOROLAC TROMETHAMINE 30 MG/ML IJ SOLN: 30.0000 mg | Freq: Once | INTRAMUSCULAR | Status: DC | PRN

## 2022-06-09 MED ORDER — FENTANYL CITRATE (PF) 100 MCG/2ML IJ SOLN
INTRAMUSCULAR | Status: DC | PRN
  Administered 2022-06-09 (×2): 50 ug via INTRAVENOUS

## 2022-06-09 MED ORDER — OXYCODONE HCL 5 MG/5ML PO SOLN: 5.0000 mg | Freq: Once | ORAL | Status: AC | PRN

## 2022-06-09 MED ORDER — EPHEDRINE 5 MG/ML INJ
INTRAVENOUS | Status: AC
  Filled 2022-06-09: qty 5

## 2022-06-09 MED ORDER — BUPIVACAINE HCL (PF) 0.25 % IJ SOLN
INTRAMUSCULAR | Status: DC | PRN
  Administered 2022-06-09: 10 mL

## 2022-06-09 MED ORDER — CELECOXIB 200 MG PO CAPS
200.0000 mg | ORAL_CAPSULE | ORAL | Status: AC
  Administered 2022-06-09: 200 mg via ORAL

## 2022-06-09 MED ORDER — HYDROMORPHONE HCL 1 MG/ML IJ SOLN
INTRAMUSCULAR | Status: AC
  Filled 2022-06-09: qty 0.5

## 2022-06-09 MED ORDER — HYDROMORPHONE HCL 1 MG/ML IJ SOLN
0.2500 mg | INTRAMUSCULAR | Status: DC | PRN
  Administered 2022-06-09: 0.5 mg via INTRAVENOUS

## 2022-06-09 MED ORDER — OXYCODONE HCL 5 MG PO TABS
5.0000 mg | ORAL_TABLET | Freq: Once | ORAL | Status: AC | PRN
  Administered 2022-06-09: 5 mg via ORAL

## 2022-06-09 MED ORDER — CEFAZOLIN SODIUM-DEXTROSE 2-4 GM/100ML-% IV SOLN
INTRAVENOUS | Status: AC
  Filled 2022-06-09: qty 100

## 2022-06-09 MED ORDER — GABAPENTIN 300 MG PO CAPS
ORAL_CAPSULE | ORAL | Status: AC
  Filled 2022-06-09: qty 1

## 2022-06-09 MED ORDER — OXYCODONE HCL 5 MG PO TABS: 5.0000 mg | ORAL_TABLET | Freq: Four times a day (QID) | ORAL | 0 refills | Status: DC | PRN

## 2022-06-09 MED ORDER — MIDAZOLAM HCL 2 MG/2ML IJ SOLN
INTRAMUSCULAR | Status: AC
  Filled 2022-06-09: qty 2

## 2022-06-09 MED ORDER — ACETAMINOPHEN 500 MG PO TABS: 1000.0000 mg | ORAL_TABLET | Freq: Once | ORAL | Status: DC

## 2022-06-09 MED ORDER — CELECOXIB 200 MG PO CAPS
ORAL_CAPSULE | ORAL | Status: AC
  Filled 2022-06-09: qty 1

## 2022-06-09 MED ORDER — LIDOCAINE HCL (CARDIAC) PF 100 MG/5ML IV SOSY
PREFILLED_SYRINGE | INTRAVENOUS | Status: DC | PRN
  Administered 2022-06-09: 60 mg via INTRAVENOUS

## 2022-06-09 MED ORDER — MIDAZOLAM HCL 5 MG/5ML IJ SOLN
INTRAMUSCULAR | Status: DC | PRN
  Administered 2022-06-09: 2 mg via INTRAVENOUS

## 2022-06-09 MED ORDER — KETOROLAC TROMETHAMINE 30 MG/ML IJ SOLN
INTRAMUSCULAR | Status: AC
  Filled 2022-06-09: qty 1

## 2022-06-09 MED ORDER — PROPOFOL 10 MG/ML IV BOLUS
INTRAVENOUS | Status: AC
  Filled 2022-06-09: qty 20

## 2022-06-09 MED ORDER — ACETAMINOPHEN 500 MG PO TABS
ORAL_TABLET | ORAL | Status: AC
  Filled 2022-06-09: qty 2

## 2022-06-09 MED ORDER — ACETAMINOPHEN 500 MG PO TABS
1000.0000 mg | ORAL_TABLET | ORAL | Status: AC
  Administered 2022-06-09: 1000 mg via ORAL

## 2022-06-09 MED ORDER — CEFAZOLIN SODIUM-DEXTROSE 2-4 GM/100ML-% IV SOLN
2.0000 g | INTRAVENOUS | Status: AC
  Administered 2022-06-09: 2 g via INTRAVENOUS

## 2022-06-09 MED ORDER — PROPOFOL 10 MG/ML IV BOLUS
INTRAVENOUS | Status: DC | PRN
  Administered 2022-06-09: 200 mg via INTRAVENOUS

## 2022-06-09 MED ORDER — GABAPENTIN 300 MG PO CAPS
300.0000 mg | ORAL_CAPSULE | ORAL | Status: AC
  Administered 2022-06-09: 300 mg via ORAL

## 2022-06-09 MED ORDER — ROCURONIUM BROMIDE 10 MG/ML (PF) SYRINGE
PREFILLED_SYRINGE | INTRAVENOUS | Status: AC
  Filled 2022-06-09: qty 10

## 2022-06-09 SURGICAL SUPPLY — 44 items
ADH SKN CLS APL DERMABOND .7 (GAUZE/BANDAGES/DRESSINGS) ×1
APL PRP STRL LF DISP 70% ISPRP (MISCELLANEOUS) ×1
APL SKNCLS STERI-STRIP NONHPOA (GAUZE/BANDAGES/DRESSINGS)
BENZOIN TINCTURE PRP APPL 2/3 (GAUZE/BANDAGES/DRESSINGS) IMPLANT
BLADE CLIPPER SURG (BLADE) IMPLANT
BLADE SURG 15 STRL LF DISP TIS (BLADE) ×1 IMPLANT
BLADE SURG 15 STRL SS (BLADE) ×1
CHLORAPREP W/TINT 26 (MISCELLANEOUS) ×1 IMPLANT
COVER BACK TABLE 60X90IN (DRAPES) ×1 IMPLANT
COVER MAYO STAND STRL (DRAPES) ×1 IMPLANT
DERMABOND ADVANCED .7 DNX12 (GAUZE/BANDAGES/DRESSINGS) ×1 IMPLANT
DRAPE LAPAROTOMY TRNSV 102X78 (DRAPES) ×1 IMPLANT
DRAPE UTILITY XL STRL (DRAPES) ×1 IMPLANT
DRSG TEGADERM 4X4.75 (GAUZE/BANDAGES/DRESSINGS) IMPLANT
ELECT COATED BLADE 2.86 ST (ELECTRODE) ×1 IMPLANT
ELECT REM PT RETURN 9FT ADLT (ELECTROSURGICAL) ×1
ELECTRODE REM PT RTRN 9FT ADLT (ELECTROSURGICAL) ×1 IMPLANT
GAUZE SPONGE 4X4 12PLY STRL LF (GAUZE/BANDAGES/DRESSINGS) IMPLANT
GLOVE BIO SURGEON STRL SZ7.5 (GLOVE) ×1 IMPLANT
GOWN STRL REUS W/ TWL LRG LVL3 (GOWN DISPOSABLE) ×2 IMPLANT
GOWN STRL REUS W/TWL LRG LVL3 (GOWN DISPOSABLE) ×2
MESH VENTRALEX ST 1-7/10 CRC S (Mesh General) IMPLANT
NDL HYPO 25X1 1.5 SAFETY (NEEDLE) ×1 IMPLANT
NEEDLE HYPO 22GX1.5 SAFETY (NEEDLE) IMPLANT
NEEDLE HYPO 25X1 1.5 SAFETY (NEEDLE) ×1 IMPLANT
NS IRRIG 1000ML POUR BTL (IV SOLUTION) IMPLANT
PACK BASIN DAY SURGERY FS (CUSTOM PROCEDURE TRAY) ×1 IMPLANT
PENCIL SMOKE EVACUATOR (MISCELLANEOUS) ×1 IMPLANT
SLEEVE SCD COMPRESS KNEE MED (STOCKING) IMPLANT
SPIKE FLUID TRANSFER (MISCELLANEOUS) ×1 IMPLANT
SPONGE T-LAP 18X18 ~~LOC~~+RFID (SPONGE) ×1 IMPLANT
STRIP CLOSURE SKIN 1/2X4 (GAUZE/BANDAGES/DRESSINGS) IMPLANT
SUT MON AB 4-0 PC3 18 (SUTURE) ×1 IMPLANT
SUT NOVA NAB DX-16 0-1 5-0 T12 (SUTURE) IMPLANT
SUT NOVA NAB GS-21 1 T12 (SUTURE) ×1 IMPLANT
SUT SILK 3 0 SH 30 (SUTURE) IMPLANT
SUT VIC AB 2-0 SH 27 (SUTURE) ×1
SUT VIC AB 2-0 SH 27XBRD (SUTURE) ×1 IMPLANT
SUT VIC AB 3-0 54X BRD REEL (SUTURE) IMPLANT
SUT VIC AB 3-0 BRD 54 (SUTURE)
SUT VIC AB 3-0 SH 27 (SUTURE)
SUT VIC AB 3-0 SH 27X BRD (SUTURE) IMPLANT
SYR CONTROL 10ML LL (SYRINGE) ×1 IMPLANT
TOWEL GREEN STERILE FF (TOWEL DISPOSABLE) ×2 IMPLANT

## 2022-06-09 NOTE — H&P (Signed)
REFERRING PHYSICIAN: Rochel Brome, MD  PROVIDER: Landry Corporal, MD  MRN: [id number] DOB: 08/04/1960 Subjective  Chief Complaint: Hernia   History of Present Illness: Christian [name] is a 62 y.o. male who is seen today as an office consultation for evaluation of Hernia .  We are asked to see the patient in consultation by Dr. Manuella Ghazi to evaluate him for an umbilical hernia. The patient is a 62 year old black male who has noticed a bulge at his umbilicus for the last several months. He does have some occasional discomfort associated with it. He has had 1 episode of nausea that resolved. His appetite has been good and his bowels are working normally. He does have some hypertension and diabetes as well as obstructive sleep apnea. He uses a CPAP machine.  Review of Systems: A complete review of systems was obtained from the patient. I have reviewed this information and discussed as appropriate with the patient. See HPI as well for other ROS.  ROS  Medical History: Past Medical History: Diagnosis Date Arthritis Asthma, unspecified asthma severity, unspecified whether complicated, unspecified whether persistent Diabetes mellitus without complication (CMS-HCC) Hypertension Liver disease Sleep apnea  Patient Active Problem List Diagnosis Umbilical hernia without obstruction or gangrene  History reviewed. No pertinent surgical history.  Allergies Allergen Reactions Promethazine Hcl Itching and Other (See Comments) burning  Current Outpatient Medications on File Prior to Visit Medication Sig Dispense Refill albuterol 90 mcg/actuation inhaler INHALE 1-2 PUFFS BY MOUTH BY MOUTH EVERY 4 HOURS AS NEEDED FOR WHEEZING atorvastatin (LIPITOR) 20 MG tablet Take 20 mg by mouth at bedtime BREZTRI AEROSPHERE 160-9-4.8 mcg/actuation inhaler INHALE TWO (2) PUFFS BY MOUTH INTO THE LUNGS IN THE MORNING AND AT BEDTIME cetirizine (ZYRTEC) 10 MG tablet Take 1 tablet by mouth once daily fluticasone  propionate (FLONASE) 50 mcg/actuation nasal spray INSTILL TWO (2) SPRAYS IN EACH NOSTRIL ONCE DAILY FUROsemide (LASIX) 20 MG tablet Take 20 mg by mouth once daily gabapentin (NEURONTIN) 300 MG capsule Take 300 mg by mouth 3 (three) times daily hydroCHLOROthiazide (HYDRODIURIL) 12.5 MG tablet Take 1 tablet by mouth once daily HYDROcodone-acetaminophen (NORCO) 7.5-325 mg tablet Take by mouth ipratropium-albuteroL (DUO-NEB) nebulizer solution INHALE 1 VIAL VIA NEBULIZER EVERY 4 HOURS AS NEEDED FOR BREATHING levothyroxine (SYNTHROID) 25 MCG tablet TAKE 1 TABLET BY MOUTH DAILY IN THE MORNING ON AN EMPTY STOMACH FOR HYPOTHYROIDISM lisinopriL-hydroCHLOROthiazide (ZESTORETIC) 10-12.5 mg tablet Take 1 tablet by mouth every morning metFORMIN (GLUCOPHAGE) 500 MG tablet Take 500 mg by mouth once daily montelukast (SINGULAIR) 10 mg tablet TAKE 1 TABLET BY MOUTH DAILY IN THE EVENING tadalafiL (CIALIS) 10 MG tablet Take 10 mg by mouth once daily  No current facility-administered medications on file prior to visit.  Family History Problem Relation Age of Onset Breast cancer Mother Breast cancer Father   Social History  Tobacco Use Smoking Status Never Smokeless Tobacco Never   Social History  Socioeconomic History Marital status: Married Tobacco Use Smoking status: Never Smokeless tobacco: Never  Objective:  Vitals: BP: (!) 146/82 Pulse: 94 Weight: 86.2 kg (190 lb) Height: 177.8 cm (5' 10"$ )  Body mass index is 27.26 kg/m.  Physical Exam Constitutional: General: He is not in acute distress. Appearance: Normal appearance. HENT: Head: Normocephalic and atraumatic. Right Ear: External ear normal. Left Ear: External ear normal. Nose: Nose normal. Mouth/Throat: Mouth: Mucous membranes are moist. Pharynx: Oropharynx is clear. Eyes: General: No scleral icterus. Extraocular Movements: Extraocular movements intact. Conjunctiva/sclera: Conjunctivae normal. Pupils: Pupils are equal,  round, and reactive to light.  Cardiovascular: Rate and Rhythm: Normal rate and regular rhythm. Pulses: Normal pulses. Heart sounds: Normal heart sounds. Pulmonary: Effort: Pulmonary effort is normal. No respiratory distress. Breath sounds: Normal breath sounds. Abdominal: General: Abdomen is flat. Bowel sounds are normal. There is no distension. Palpations: Abdomen is soft. Tenderness: There is no abdominal tenderness. Comments: There is a small to moderate-sized umbilical hernia that appears to have some omentum in it. Musculoskeletal: General: No swelling or deformity. Normal range of motion. Cervical back: Normal range of motion and neck supple. No tenderness. Skin: General: Skin is warm and dry. Coloration: Skin is not jaundiced. Neurological: General: No focal deficit present. Mental Status: He is alert and oriented to person, place, and time. Psychiatric: Mood and Affect: Mood normal. Behavior: Behavior normal.    Labs, Imaging and Diagnostic Testing:  Assessment and Plan:  Diagnoses and all orders for this visit:  Umbilical hernia without obstruction or gangrene    The patient appears to have a small to medium sized umbilical hernia. Because of the risk of incarceration and strangulation I would recommend that he have this fixed. He would also like to have this done. I have discussed with him in detail the risks and benefits of the operation to fix the hernia as well as some of the technical aspects including use of mesh and he understands and wishes to proceed.

## 2022-06-09 NOTE — Discharge Instructions (Addendum)
   No tylenol products till after 4pm No motrin type products (NSAIDS till after 4pm) No oxy pain meds till after 7:15.  Pain med can last up to 6 hrs    Post Anesthesia Home Care Instructions  Activity: Get plenty of rest for the remainder of the day. A responsible individual must stay with you for 24 hours following the procedure.  For the next 24 hours, DO NOT: -Drive a car -Paediatric nurse -Drink alcoholic beverages -Take any medication unless instructed by your physician -Make any legal decisions or sign important papers.  Meals: Start with liquid foods such as gelatin or soup. Progress to regular foods as tolerated. Avoid greasy, spicy, heavy foods. If nausea and/or vomiting occur, drink only clear liquids until the nausea and/or vomiting subsides. Call your physician if vomiting continues.  Special Instructions/Symptoms: Your throat may feel dry or sore from the anesthesia or the breathing tube placed in your throat during surgery. If this causes discomfort, gargle with warm salt water. The discomfort should disappear within 24 hours.  If you had a scopolamine patch placed behind your ear for the management of post- operative nausea and/or vomiting:  1. The medication in the patch is effective for 72 hours, after which it should be removed.  Wrap patch in a tissue and discard in the trash. Wash hands thoroughly with soap and water. 2. You may remove the patch earlier than 72 hours if you experience unpleasant side effects which may include dry mouth, dizziness or visual disturbances. 3. Avoid touching the patch. Wash your hands with soap and water after contact with the patch.

## 2022-06-09 NOTE — Anesthesia Procedure Notes (Signed)
Procedure Name: Intubation Date/Time: 06/09/2022 11:35 AM  Performed by: Bufford Spikes, CRNAPre-anesthesia Checklist: Patient identified, Emergency Drugs available, Suction available and Patient being monitored Patient Re-evaluated:Patient Re-evaluated prior to induction Oxygen Delivery Method: Circle system utilized Preoxygenation: Pre-oxygenation with 100% oxygen Induction Type: IV induction Ventilation: Mask ventilation without difficulty Laryngoscope Size: Miller and 2 Grade View: Grade II Tube type: Oral Tube size: 7.0 mm Number of attempts: 1 Airway Equipment and Method: Stylet and Oral airway Placement Confirmation: ETT inserted through vocal cords under direct vision, positive ETCO2 and breath sounds checked- equal and bilateral Secured at: 21 cm Tube secured with: Tape Dental Injury: Teeth and Oropharynx as per pre-operative assessment

## 2022-06-09 NOTE — Transfer of Care (Signed)
Immediate Anesthesia Transfer of Care Note  Patient: Christian [name]  Procedure(s) Performed: HERNIA REPAIR UMBILICAL WITH MESH (Abdomen) INSERTION OF MESH (Abdomen)  Patient Location: PACU  Anesthesia Type:General  Level of Consciousness: awake, alert , oriented, drowsy, and patient cooperative  Airway & Oxygen Therapy: Patient Spontanous Breathing and Patient connected to face mask oxygen  Post-op Assessment: Report given to RN and Post -op Vital signs reviewed and stable  Post vital signs: Reviewed and stable  Last Vitals:  Vitals Value Taken Time  BP    Temp    Pulse    Resp    SpO2      Last Pain:  Vitals:   06/09/22 1042  TempSrc: Oral  PainSc: 0-No pain      Patients Stated Pain Goal: 7 ([id number])  Complications: No notable events documented.

## 2022-06-09 NOTE — Anesthesia Postprocedure Evaluation (Signed)
Anesthesia Post Note  Patient: Christian [name]  Procedure(s) Performed: HERNIA REPAIR UMBILICAL WITH MESH (Abdomen) INSERTION OF MESH (Abdomen)     Patient location during evaluation: PACU Anesthesia Type: General Level of consciousness: awake and alert, oriented and patient cooperative Pain management: pain level controlled Vital Signs Assessment: post-procedure vital signs reviewed and stable Respiratory status: spontaneous breathing, nonlabored ventilation and respiratory function stable Cardiovascular status: blood pressure returned to baseline and stable Postop Assessment: no apparent nausea or vomiting Anesthetic complications: no   No notable events documented.  Last Vitals:  Vitals:   06/09/22 1246 06/09/22 1311  BP: (!) 149/92 (!) 138/93  Pulse: (!) 59 (!) 59  Resp: 15 16  Temp:  (!) 36.4 C  SpO2: 100% 94%                  Pervis Hocking

## 2022-06-09 NOTE — Op Note (Addendum)
06/09/2022  12:06 PM  PATIENT:  Christian [name]  62 y.o. male  PRE-OPERATIVE DIAGNOSIS:  UNBILICAL HERNIA  POST-OPERATIVE DIAGNOSIS:  UNBILICAL HERNIA  PROCEDURE:  Procedure(s): HERNIA REPAIR UMBILICAL WITH MESH (N/A) INSERTION OF MESH (N/A)  SURGEON:  Surgeon(s) and Role:    * Jovita Kussmaul, MD - Primary  PHYSICIAN ASSISTANT:   ASSISTANTS: none   ANESTHESIA:   local and general  EBL:  25 mL   BLOOD ADMINISTERED:none  DRAINS: none   LOCAL MEDICATIONS USED:  MARCAINE     SPECIMEN:  No Specimen  DISPOSITION OF SPECIMEN:  N/A  COUNTS:  YES  TOURNIQUET:  * No tourniquets in log *  DICTATION: .Dragon Dictation  After informed consent was obtained the patient was brought to the operating room and placed in the supine position on the operating table.  After adequate induction of general anesthesia the patient's abdomen was prepped with ChloraPrep, allowed to dry, and draped in usual sterile manner.  An appropriate timeout was performed.  The area around the umbilicus was infiltrated with quarter percent Marcaine.  A small transversely oriented incision was made at the lower edge of the umbilicus with a 15 blade knife.  The incision was carried through the skin and subcutaneous tissue sharply with the electrocautery.  The hernia sac was readily identified.  The hernia sac was opened and some fatty lobules were separated from the wall of the sac.  The fatty lobules were reduced back through the fascial defect into the preperitoneal space.  The preperitoneal space was probed bluntly with a finger.  It did not appear as though the peritoneum had been violated and we seem to be isolated to the preperitoneal space.  The fascial edges were freed of any adherence of these fatty lobules.  The fascial edges appeared thick and healthy.  It shows a small piece of umbilical hernia repair mesh.  The mesh was oriented with the coated side down.  The mesh was placed through the fascial defect  into the preperitoneal space and then was observed to open and good apposition to the anterior abdominal wall fascia when the anchors were pulled up on.  The anchors were then trimmed.  The fascial defect was then closed in a transverse manner with #1 Novafil stitches incorporating the anchors of the mesh.  The fascial defect measured about 1.5 cm.  Once this was accomplished the hernia seem well repaired and the mesh was in good position.  The wound was irrigated with copious amounts of saline.  The base of the umbilicus was tacked back to the fascia with an interrupted 3-0 Vicryl stitch.  The subcutaneous tissue was reapproximated with interrupted 3-0 Vicryl stitches.  The skin was then closed with a running 4-0 Monocryl subcuticular stitch.  Dermabond dressings were applied.  The patient tolerated the procedure well.  At the end of the case all needle sponge and instrument counts were correct.  The patient was then awakened and taken to recovery in stable condition.  PLAN OF CARE: Discharge to home after PACU  PATIENT DISPOSITION:  PACU - hemodynamically stable.   Delay start of Pharmacological VTE agent (>24hrs) due to surgical blood loss or risk of bleeding: not applicable

## 2022-06-09 NOTE — Anesthesia Preprocedure Evaluation (Addendum)
Anesthesia Evaluation  Patient identified by MRN, date of birth, ID band Patient awake    Reviewed: Allergy & Precautions, NPO status , Patient's Chart, lab work & pertinent test results  Airway Mallampati: III  TM Distance: >3 FB Neck ROM: Full    Dental  (+) Teeth Intact, Dental Advisory Given, Missing,    Pulmonary asthma , sleep apnea and Continuous Positive Airway Pressure Ventilation , COPD Uses both inhalers every morning and every night    Pulmonary exam normal breath sounds clear to auscultation       Cardiovascular hypertension (151/90 in preop, 130/70 normally), Pt. on medications Normal cardiovascular exam Rhythm:Regular Rate:Normal     Neuro/Psych  negative psych ROS   GI/Hepatic negative GI ROS, Neg liver ROS,,,  Endo/Other  diabetes, Well Controlled, Type 2, Oral Hypoglycemic Agents    Renal/GU Renal InsufficiencyRenal diseaseCr 1.28  negative genitourinary   Musculoskeletal  (+) Arthritis , Osteoarthritis,    Abdominal   Peds  Hematology negative hematology ROS (+)   Anesthesia Other Findings   Reproductive/Obstetrics negative OB ROS                             Anesthesia Physical Anesthesia Plan  ASA: 3  Anesthesia Plan: General   Post-op Pain Management: Tylenol PO (pre-op)* and Toradol IV (intra-op)*   Induction: Intravenous  PONV Risk Score and Plan: 2 and Ondansetron, Dexamethasone, Midazolam and Treatment may vary due to age or medical condition  Airway Management Planned: Oral ETT  Additional Equipment: None  Intra-op Plan:   Post-operative Plan: Extubation in OR  Informed Consent:   Plan Discussed with:   Anesthesia Plan Comments:        Anesthesia Quick Evaluation

## 2022-06-10 ENCOUNTER — Encounter (HOSPITAL_BASED_OUTPATIENT_CLINIC_OR_DEPARTMENT_OTHER): Payer: Self-pay | Admitting: General Surgery

## 2022-07-09 ENCOUNTER — Encounter: Payer: Self-pay | Admitting: Gastroenterology

## 2022-07-22 ENCOUNTER — Encounter: Payer: Self-pay | Admitting: Gastroenterology

## 2022-07-25 ENCOUNTER — Other Ambulatory Visit: Payer: Self-pay | Admitting: Internal Medicine

## 2022-07-25 DIAGNOSIS — J8283 Eosinophilic asthma: Secondary | ICD-10-CM

## 2022-07-28 NOTE — Telephone Encounter (Signed)
Requested Prescriptions  Pending Prescriptions Disp Refills   albuterol (VENTOLIN HFA) 108 (90 Base) MCG/ACT inhaler [Pharmacy Med Name: Albuterol Sulfate HFA 108 (90 Base) MCG/ACT Inhalation Aerosol Solution] 18 g 0    Sig: INHALE 1 TO 2 PUFFS BY MOUTH AS NEEDED FOR WHEEZING     Pulmonology:  Beta Agonists 2 Failed - 07/25/2022  6:26 PM      Failed - Last BP in normal range    BP Readings from Last 1 Encounters:  06/09/22 (!) 138/93         Failed - Valid encounter within last 12 months    Recent Outpatient Visits           1 year ago Acute non-recurrent sinusitis, unspecified location   [facility], Kriste Basque, NP   1 year ago Diabetic peripheral neuropathy associated with type 2 diabetes mellitus [facility])   [place] Ladell Pier, MD   1 year ago Need for zoster vaccination   [place], Jarome Matin, RPH-CPP   1 year ago Hypertension associated with diabetes [facility])   [place], [place] L, RPH-CPP   1 year ago Type 2 diabetes mellitus without complication, without long-term current use of insulin [facility])   [place] Ladell Pier, MD              Passed - Last Heart Rate in normal range    Pulse Readings from Last 1 Encounters:  06/09/22 (!) 59

## 2022-07-29 ENCOUNTER — Telehealth: Payer: Self-pay | Admitting: Pulmonary Disease

## 2022-07-29 NOTE — Telephone Encounter (Signed)
Pt called the office stating that he needs to have his Nucala refilled. Please advise on this.

## 2022-07-31 NOTE — Telephone Encounter (Signed)
Refill for Nucala faxed to [facility]  Fax: [phone] Phone: [phone]  Knox Saliva, PharmD, MPH, BCPS, CPP Clinical Pharmacist (Rheumatology and Pulmonology)

## 2022-08-21 ENCOUNTER — Telehealth: Payer: Self-pay

## 2022-08-21 ENCOUNTER — Ambulatory Visit: Payer: Medicare HMO

## 2022-08-21 NOTE — Telephone Encounter (Signed)
Patient failed to call back to the office prior to 5 pm to reschedule Pre Visit appt; no show letter sent to patient;

## 2022-08-21 NOTE — Telephone Encounter (Signed)
Attempted to reach patient for Pre Visit appt;  Unable to speak with patient; Message was left for patient to call back to the office at [phone] to reschedule Pre Visit appt; No show letter will be sent to the patient if he fails to call back to the office prior to 5pm;

## 2022-08-26 NOTE — Telephone Encounter (Signed)
Patient called to return phone that he stated was from today. Please advise, thank you.

## 2022-08-27 NOTE — Telephone Encounter (Signed)
This patient just needs to be called back as he was supposed to be rescheduled for his PV and procedure appt- he no showed the PV appt-

## 2022-08-28 NOTE — Telephone Encounter (Signed)
Called patient to reschedule, left voicemail

## 2022-09-17 ENCOUNTER — Encounter: Payer: Medicare HMO | Admitting: Gastroenterology

## 2022-09-24 ENCOUNTER — Encounter: Payer: Medicare HMO | Admitting: Gastroenterology

## 2022-11-27 ENCOUNTER — Other Ambulatory Visit: Payer: Self-pay | Admitting: Pulmonary Disease

## 2022-11-27 DIAGNOSIS — J4541 Moderate persistent asthma with (acute) exacerbation: Secondary | ICD-10-CM

## 2022-12-03 ENCOUNTER — Other Ambulatory Visit: Payer: Self-pay | Admitting: Pulmonary Disease

## 2022-12-03 DIAGNOSIS — J4541 Moderate persistent asthma with (acute) exacerbation: Secondary | ICD-10-CM

## 2023-01-19 ENCOUNTER — Ambulatory Visit
Admission: RE | Admit: 2023-01-19 | Discharge: 2023-01-19 | Disposition: A | Discharge status: Home / Self Care | Payer: Medicare HMO | Source: Ambulatory Visit | Attending: Family Medicine | Admitting: Family Medicine

## 2023-01-19 ENCOUNTER — Other Ambulatory Visit: Payer: Self-pay | Admitting: Family Medicine

## 2023-01-19 DIAGNOSIS — M25511 Pain in right shoulder: Secondary | ICD-10-CM

## 2023-02-09 ENCOUNTER — Other Ambulatory Visit: Payer: Self-pay | Admitting: Family Medicine

## 2023-02-09 ENCOUNTER — Ambulatory Visit
Admission: RE | Admit: 2023-02-09 | Discharge: 2023-02-09 | Disposition: A | Discharge status: Home / Self Care | Payer: Medicare HMO | Source: Ambulatory Visit | Attending: Family Medicine | Admitting: Family Medicine

## 2023-02-09 DIAGNOSIS — J189 Pneumonia, unspecified organism: Secondary | ICD-10-CM

## 2023-02-10 ENCOUNTER — Encounter: Payer: Self-pay | Admitting: Pulmonary Disease

## 2023-02-10 ENCOUNTER — Ambulatory Visit (INDEPENDENT_AMBULATORY_CARE_PROVIDER_SITE_OTHER): Payer: Medicare HMO | Admitting: Pulmonary Disease

## 2023-02-10 DIAGNOSIS — J4541 Moderate persistent asthma with (acute) exacerbation: Secondary | ICD-10-CM | POA: Diagnosis not present

## 2023-02-10 DIAGNOSIS — J8283 Eosinophilic asthma: Secondary | ICD-10-CM | POA: Diagnosis not present

## 2023-02-10 MED ORDER — INCRUSE ELLIPTA 62.5 MCG/ACT IN AEPB: 1.0000 | INHALATION_SPRAY | Freq: Every day | RESPIRATORY_TRACT | 5 refills | Status: DC

## 2023-02-10 MED ORDER — BREZTRI AEROSPHERE 160-9-4.8 MCG/ACT IN AERO: 2.0000 | INHALATION_SPRAY | Freq: Two times a day (BID) | RESPIRATORY_TRACT | 3 refills | Status: DC

## 2023-02-10 MED ORDER — ALBUTEROL SULFATE HFA 108 (90 BASE) MCG/ACT IN AERS: INHALATION_SPRAY | RESPIRATORY_TRACT | 3 refills | Status: AC

## 2023-02-10 MED ORDER — IPRATROPIUM-ALBUTEROL 0.5-2.5 (3) MG/3ML IN SOLN: 3.0000 mL | RESPIRATORY_TRACT | 5 refills | Status: AC | PRN

## 2023-02-10 NOTE — Progress Notes (Signed)
Christian [name]    [id number]    [birth year]  Primary Care Physician:Pcp, No  Referring Physician: No referring provider defined for this encounter.  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 He is on Breztri which he uses regularly  He does have a cough, bringing up some secretions  Obstructive sleep apnea diagnosed 10 years ago for which he is on CPAP, compliant with CPAP use  Mild obstructive sleep apnea  Does have shortness of breath with exertion  Usually goes to bed between 10 and 11:30 PM Takes him between 15 and 30 minutes to fall asleep Final awakening between 6 and 7 AM  Never smoker  History of hypertension, asthma, high cholesterol  Is currently on a course of prednisone, uses Advair 250  Outpatient Encounter Medications as of 02/10/2023  Medication Sig   albuterol (VENTOLIN HFA) 108 (90 Base) MCG/ACT inhaler INHALE 1-2 PUFFS BY MOUTH EVERY 4 HOURS AS NEEDED FOR WHEEZING   aspirin EC 81 MG tablet Take 81 mg by mouth daily. Swallow whole.   Baclofen 5 MG TABS Take 1 tablet by mouth 2 (two) times daily as needed (Muscle spasms).   cetirizine (ZYRTEC) 10 MG tablet Take 1 tablet (10 mg total) by mouth daily.   fluticasone (FLONASE) 50 MCG/ACT nasal spray Place 2 sprays into both nostrils daily.   furosemide (LASIX) 20 MG tablet TAKE 1 TABLET BY MOUTH ONCE DAILY FOR  ONE  WEEK  THEN  AS  NEEDED  FOR  SWELLING (Patient taking differently: Take 20 mg by mouth daily as needed for edema.)   gabapentin (NEURONTIN) 300 MG capsule Take 300 mg by mouth daily.   hydrochlorothiazide (HYDRODIURIL) 12.5 MG tablet TAKE 1 TABLET BY MOUTH  DAILY  (Patient taking differently: Take 12.5 mg by mouth daily.)   ipratropium-albuterol (DUONEB) 0.5-2.5 (3) MG/3ML SOLN INHALE 1 VIAL VIA NEBULIZER EVERY 4 HOURS AS NEEDED FOR BREATHING   levothyroxine (SYNTHROID) 25 MCG tablet Take 25 mcg by mouth every morning.   lisinopril-hydrochlorothiazide (ZESTORETIC) 10-12.5 MG tablet Take 1 tablet by mouth daily.   Mepolizumab (NUCALA) 100 MG/ML SOAJ Inject 1 mL (100 mg total) into the skin every 28 (twenty-eight) days. **patient assistance program**   metFORMIN (GLUCOPHAGE-XR) 500 MG 24 hr tablet TAKE 1 TABLET BY MOUTH DAILY WITH BREAKFAST   montelukast (SINGULAIR) 10 MG tablet Take 1 tablet (10 mg total) by mouth at bedtime.   naloxone (NARCAN) nasal spray 4 mg/0.1 mL Place 1 spray into the nose once.   pravastatin (PRAVACHOL) 40 MG tablet Take 1 tablet (40 mg total) by mouth at bedtime.   tadalafil (CIALIS) 10 MG tablet Take 10 mg by mouth daily.   umeclidinium bromide (INCRUSE ELLIPTA) 62.5 MCG/ACT AEPB Inhale 1 puff into the lungs daily.   [DISCONTINUED] HYDROcodone-acetaminophen (NORCO) 7.5-325 MG tablet Take 1 tablet by mouth 3 (three) times daily as needed for moderate pain or severe pain.   [DISCONTINUED] oxyCODONE (ROXICODONE) 5 MG immediate release tablet Take 1 tablet (5 mg total) by mouth every 6 (six) hours as needed for severe pain.   No facility-administered encounter medications on file as of 02/10/2023.  Allergies as of 02/10/2023 - Review Complete 02/10/2023  Allergen Reaction Noted   Phenergan [promethazine hcl] Itching and Other (See Comments) 09/15/2015    Past Medical History:  Diagnosis Date   Allergy    Arthritis    "left knee" (01/22/2016)   Asthma    Chronic lower back pain    COPD (chronic obstructive pulmonary disease) (HCC)    Dyspnea    with extertion   High cholesterol    Hypertension    Neuropathy    OSA on CPAP    Secondhand smoke exposure    Sleep apnea    wears CPAP, not wearing now  for months   Type  II diabetes mellitus (HCC) dx'd 01/17/2016    Past Surgical History:  Procedure Laterality Date   ANTERIOR CERVICAL DECOMP/DISCECTOMY FUSION  ~ 1995   "titanium"   APPENDECTOMY  ~ 1977   BACK SURGERY     x 2  L 4disectomy   COLONOSCOPY W/ POLYPECTOMY     INGUINAL HERNIA REPAIR Right ~ 2000   INSERTION OF MESH N/A 06/09/2022   Procedure: INSERTION OF MESH;  Surgeon: Griselda Miner, MD;  Location: [facility];  Service: General;  Laterality: N/A;   LUMBAR DISC SURGERY  ~ 2000; ~2006   NASAL SINUS SURGERY  ~ 2007   TOTAL KNEE ARTHROPLASTY Left 08/06/2017   Procedure: LEFT TOTAL KNEE ARTHROPLASTY;  Surgeon: Tarry Kos, MD;  Location: [facility];  Service: Orthopedics;  Laterality: Left;   UMBILICAL HERNIA REPAIR N/A 06/09/2022   Procedure: HERNIA REPAIR UMBILICAL WITH MESH;  Surgeon: Griselda Miner, MD;  Location: [facility];  Service: General;  Laterality: N/A;    Family History  Problem Relation Age of Onset   Hypertension Mother    Hypertension Father    Heart Problems Brother        Heart transplant, pt does not know cause    Rectal cancer Brother    Colon cancer Neg Hx    Esophageal cancer Neg Hx    Stomach cancer Neg Hx     Social History   Socioeconomic History   Marital status: Married    Spouse name: Not on file   Number of children: Not on file   Years of education: Not on file   Highest education level: Not on file  Occupational History   Not on file  Tobacco Use   Smoking status: Never    Passive exposure: Yes   Smokeless tobacco: Never   Tobacco comments:    passive cig smoking at work  1994- 2012  Vaping Use   Vaping status: Never Used  Substance and Sexual Activity   Alcohol use: No   Drug use: No   Sexual activity: Yes  Other Topics Concern   Not on file  Social History Narrative   Not on file   Social Determinants of Health   Financial Resource Strain: High Risk (01/19/2021)   Overall Financial Resource Strain (CARDIA)     Difficulty of Paying Living Expenses: Hard  Food Insecurity: No Food Insecurity (01/17/2022)   Hunger Vital Sign    Worried About Running Out of Food in the Last Year: Never true    Ran Out of Food in the Last Year: Never true  Transportation Needs: No Transportation Needs (01/17/2022)   PRAPARE - Administrator, Civil Service (Medical): No    Lack of Transportation (Non-Medical): No  Physical Activity: Inactive (01/19/2021)   Exercise  Vital Sign    Days of Exercise per Week: 0 days    Minutes of Exercise per Session: 0 min  Stress: No Stress Concern Present (01/19/2021)   [employer] of Occupational Health - Occupational Stress Questionnaire    Feeling of Stress : Not at all  Social Connections: Moderately Isolated (01/19/2021)   Social Connection and Isolation Panel [NHANES]    Frequency of Communication with Friends and Family: Three times a week    Frequency of Social Gatherings with Friends and Family: Once a week    Attends Religious Services: Never    Database administrator or Organizations: No    Attends Banker Meetings: Never    Marital Status: Married  Catering manager Violence: Not At Risk (01/17/2022)   Humiliation, Afraid, Rape, and Kick questionnaire    Fear of Current or Ex-Partner: No    Emotionally Abused: No    Physically Abused: No    Sexually Abused: No    Review of Systems  Constitutional: Negative.   HENT: Negative.    Eyes: Negative.   Respiratory:  Positive for cough. Negative for shortness of breath.   Cardiovascular: Negative.   Gastrointestinal: Negative.   Endocrine: Negative.   Genitourinary: Negative.     Vitals:   02/10/23 1047  BP: (!) 160/100  Pulse: 77  SpO2: 95%     Physical Exam Constitutional:      Appearance: Normal appearance. He is well-developed.  HENT:     Head: Normocephalic and atraumatic.     Mouth/Throat:     Mouth: Mucous membranes are moist.  Eyes:     General:        Right eye: No  discharge.        Left eye: No discharge.  Neck:     Thyroid: No thyromegaly.     Trachea: No tracheal deviation.  Cardiovascular:     Rate and Rhythm: Normal rate and regular rhythm.  Pulmonary:     Effort: Pulmonary effort is normal. No respiratory distress.     Breath sounds: Normal breath sounds. No stridor. No wheezing, rhonchi or rales.  Musculoskeletal:     Cervical back: No rigidity or tenderness.  Neurological:     Mental Status: He is alert.  Psychiatric:        Mood and Affect: Mood normal.       11/25/2018    4:00 PM  Results of the Epworth flowsheet  Sitting and reading 0  Watching TV 1  Sitting, inactive in a public place (e.g. a theatre or a meeting) 0  As a passenger in a car for an hour without a break 0  Lying down to rest in the afternoon when circumstances permit 3  Sitting and talking to someone 3  Sitting quietly after a lunch without alcohol 0  In a car, while stopped for a few minutes in traffic 0  Total score 7    Data Reviewed: Compliance data reviewed showing excellent compliance with CPAP average use of 7 hours 43 minutes AutoSet 5-15 Residual AHI of 0.9  Assessment:  Mild obstructive sleep apnea  -Encouraged to continue using CPAP  Eosinophilic asthma -On Nucala -On Breztri  Shortness of breath/cough -Bronchitis -Completing course of antibiotics     Plan/Recommendations: Encouraged to continue using CPAP on a nightly basis  Complete course of antibiotics  Continue bronchodilators  Continue Nucala  Call us with significant concerns  I spent 30 minutes dedicated to the care of this patient  on the date of this encounter to include previsit review of records, face-to-face time with the patient discussing conditions above, post visit ordering of testing, clinical documentation with electronic health record.  Virl Diamond MD [facility] 02/10/2023, 11:09 AM  CC: No ref. provider found

## 2023-02-10 NOTE — Patient Instructions (Addendum)
Will see you in about 3 months  Continue CPAP on a nightly basis  Complete your antibiotics Complete steroids  Refills for your nebulizer and inhaler sent to pharmacy

## 2023-02-11 ENCOUNTER — Telehealth: Payer: Self-pay | Admitting: Pulmonary Disease

## 2023-02-11 NOTE — Telephone Encounter (Signed)
Patient currently taking Doxycycline. Patient phone number is [phone].

## 2023-02-13 NOTE — Telephone Encounter (Signed)
Pt's medication list has been updated showing doxycycline on it.

## 2023-02-28 LAB — COLOGUARD: COLOGUARD: NEGATIVE

## 2023-03-04 ENCOUNTER — Telehealth: Payer: Self-pay | Admitting: Pulmonary Disease

## 2023-03-04 NOTE — Telephone Encounter (Signed)
PT calling about updating his Breztri ppwk. His # is [phone]

## 2023-03-06 NOTE — Telephone Encounter (Signed)
Patient calling again about updating his Christian [name] paper work. His number  is [phone]

## 2023-03-09 NOTE — Telephone Encounter (Signed)
Patient calling again about updating his Christian [name] paper work. His number  is [phone]

## 2023-03-09 NOTE — Telephone Encounter (Signed)
Called and spoke to patient.  He is wanting to know if he is due for [facility] re certification. I have suggested that he contact [employer]. I have provided him with contact number.  Nothing further needed.

## 2023-03-31 ENCOUNTER — Telehealth: Payer: Self-pay | Admitting: Pulmonary Disease

## 2023-03-31 NOTE — Telephone Encounter (Signed)
Please provide the original docments back to the patient once the order has been faxed.

## 2023-03-31 NOTE — Telephone Encounter (Signed)
Patient needs forms([employer]) to be be filled out and faxed back  so he can get his inhalers.

## 2023-04-07 ENCOUNTER — Telehealth: Payer: Self-pay | Admitting: Pulmonary Disease

## 2023-04-07 NOTE — Telephone Encounter (Signed)
Please see last encounters. Dr. Val Eagle still needs to sign medication assistance form. Please call him and he will come pick up the original signed docs, he said.

## 2023-04-09 NOTE — Telephone Encounter (Addendum)
Pt calling back for a update on his paperwork, patient will like samples in the meantime for his [employer]

## 2023-04-13 ENCOUNTER — Telehealth: Payer: Self-pay | Admitting: Pulmonary Disease

## 2023-04-13 NOTE — Telephone Encounter (Signed)
Patient dropped off paperwork back on December 3rd and is wondering if it has been faxed and when it will be ready for him to pick up the original copies.

## 2023-04-13 NOTE — Telephone Encounter (Signed)
Patient needs samples of Breztri. Please call when they are made available [phone]

## 2023-04-14 NOTE — Telephone Encounter (Signed)
Attempted to call patient to let him know that forms were faxed to [employer] and that they are ready for pick up. Will place forms in cabinet up front.

## 2023-04-15 NOTE — Telephone Encounter (Signed)
Patient checking on message for Breztri samples. Patient phone number is [phone].

## 2023-04-16 MED ORDER — BREZTRI AEROSPHERE 160-9-4.8 MCG/ACT IN AERO: 2.0000 | INHALATION_SPRAY | Freq: Two times a day (BID) | RESPIRATORY_TRACT | 3 refills | Status: DC

## 2023-04-16 MED ORDER — BREZTRI AEROSPHERE 160-9-4.8 MCG/ACT IN AERO: 2.0000 | INHALATION_SPRAY | Freq: Two times a day (BID) | RESPIRATORY_TRACT | Status: AC

## 2023-04-16 NOTE — Telephone Encounter (Signed)
I have sent rx for Breztri to [facility]- [place] [facility] [place] [employer] samples up front for pt to  pick up  Pt aware and nothing further needed

## 2023-04-16 NOTE — Telephone Encounter (Signed)
Patient states [employer] needs RX for [employer]. Phone number is [phone]. Fax number is [phone]. Patient phone number is [phone].

## 2023-04-17 NOTE — Telephone Encounter (Signed)
2nd message left to p/u copies of patient assistance paperwork. Closing message.

## 2023-05-13 ENCOUNTER — Ambulatory Visit: Payer: Medicare HMO | Admitting: Pulmonary Disease

## 2023-05-28 ENCOUNTER — Telehealth: Payer: Self-pay | Admitting: Pulmonary Disease

## 2023-05-28 NOTE — Telephone Encounter (Signed)
PT calling about his Nucala shot. Please call @ [phone]

## 2023-06-02 NOTE — Telephone Encounter (Signed)
 Returned call to patient regarding Nucala  application. He will stop by clinic tomorrow to sign. Placed in file cabinet up front.  Prescriber form placed in Dr. Cathye mailbox for signature.  Sherry Pennant, PharmD, MPH, BCPS, CPP Clinical Pharmacist (Rheumatology and Pulmonology)

## 2023-06-02 NOTE — Telephone Encounter (Signed)
Patient checking on message for Nucala shot. Patient phone number is [phone].

## 2023-06-04 ENCOUNTER — Telehealth: Payer: Self-pay | Admitting: Pulmonary Disease

## 2023-06-04 NOTE — Telephone Encounter (Signed)
 PT presented to the front with a PT assistance form for Nucala . I will put it in the RX box. I gave PT a copy. nfn

## 2023-06-04 NOTE — Telephone Encounter (Signed)
 Received signed patient forms. Patient did not sign form or write down correct income information. I called patient to confirm if ok to sign form and corrected income  He asked if there was any way to expedite form. I advised that we are waiting to get Dr. Cathye form back to us  before we submit. Unfortunately there is no way to expedited it from our end at this point. Dr. MALVA is in clinic tomrrow so we may get the form back then  Zainab Crumrine, PharmD, MPH, BCPS, CPP Clinical Pharmacist (Rheumatology and Pulmonology)

## 2023-06-09 NOTE — Telephone Encounter (Signed)
We have not receive the provider form back from Dr. Val Eagle so will submit once received

## 2023-06-09 NOTE — Telephone Encounter (Signed)
Submitted Patient Assistance RENEWAL Application to [employer] [place] for NUCALA along with provider portion, patient portion, medication list. Will update patient when we receive a response.  Phone #: [phone] Fax #: [phone]  Submitted an URGENT Prior Authorization request to [facility] for NUCALA via CoverMyMeds. Will update once we receive a response.  Key: [id number]   Once PA is approved, will need to fax to [facility]  Chesley Mires, PharmD, MPH, BCPS, CPP Clinical Pharmacist (Rheumatology and Pulmonology)

## 2023-06-10 ENCOUNTER — Other Ambulatory Visit (HOSPITAL_COMMUNITY): Payer: Self-pay

## 2023-06-10 NOTE — Telephone Encounter (Signed)
Received notification from [facility] regarding a prior authorization for NUCALA. Authorization has been APPROVED from 06/09/2023 to 04/27/2024. Approval letter sent to scan center.  Test claim shows copay of $1310.85 for 28 days supply  Authorization # [id number]  Approval letter faxed to [employer] to Bonna Gains, PharmD, MPH, BCPS, CPP Clinical Pharmacist (Rheumatology and Pulmonology)

## 2023-06-26 NOTE — Telephone Encounter (Addendum)
 Received summary of benefits from [employer] for Nucala PAP.  Received separate fax from [employer] PAP requesting income documentation (copy of SS benefit statement for all member's of household, bank statements showing SSA deposit, federal or state tax return (1040), pay stubs for one month of earning). Patient states that he did speak with [employer]. He plans to collect documents and drop off to clinic on Monday [facility] pharmacy team  Chesley Mires, PharmD, MPH, BCPS, CPP Clinical Pharmacist (Rheumatology and Pulmonology)

## 2023-06-30 NOTE — Telephone Encounter (Signed)
 Patient has dropped off transaction history. Will place in pharmacy box.

## 2023-07-01 NOTE — Telephone Encounter (Signed)
 This transaction history may not be accepted by [employer]. I have faxed this over to patient assistance program nevertheless  Chesley Mires, PharmD, MPH, BCPS, CPP Clinical Pharmacist (Rheumatology and Pulmonology)

## 2023-07-09 ENCOUNTER — Encounter: Payer: Self-pay | Admitting: Nurse Practitioner

## 2023-07-09 ENCOUNTER — Ambulatory Visit
Admission: RE | Admit: 2023-07-09 | Discharge: 2023-07-09 | Disposition: A | Discharge status: Home / Self Care | Source: Ambulatory Visit | Attending: Family Medicine | Admitting: Family Medicine

## 2023-07-09 ENCOUNTER — Other Ambulatory Visit: Payer: Self-pay | Admitting: Family Medicine

## 2023-07-09 ENCOUNTER — Inpatient Hospital Stay: Payer: Medicare Other | Admitting: Nurse Practitioner

## 2023-07-09 DIAGNOSIS — Z0000 Encounter for general adult medical examination without abnormal findings: Secondary | ICD-10-CM

## 2023-07-20 ENCOUNTER — Ambulatory Visit: Admitting: Pulmonary Disease

## 2023-07-20 DIAGNOSIS — R0602 Shortness of breath: Secondary | ICD-10-CM

## 2023-07-20 MED ORDER — DOXYCYCLINE HYCLATE 100 MG PO TABS: 100.0000 mg | ORAL_TABLET | Freq: Two times a day (BID) | ORAL | 0 refills | Status: AC

## 2023-07-20 MED ORDER — PREDNISONE 20 MG PO TABS: 20.0000 mg | ORAL_TABLET | Freq: Every day | ORAL | 0 refills | Status: DC

## 2023-07-20 MED ORDER — BENZONATATE 200 MG PO CAPS: 200.0000 mg | ORAL_CAPSULE | Freq: Three times a day (TID) | ORAL | 1 refills | Status: AC | PRN

## 2023-07-20 NOTE — Progress Notes (Signed)
 Christian [name]    [id number]    1960-11-22  Primary Care Physician:Pcp, No  Referring Physician: No referring provider defined for this encounter.  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 Was recently able to get Nucala reinitiated, started his first dose recently  He does have a cough, clear phlegm Has not had any fevers or chills Has not been around anybody with a febrile illness  Obstructive sleep apnea was diagnosed over 10 years ago, it was mild sleep apnea  Usually goes to bed between 10 and 11:30 PM Takes him between 15 and 30 minutes to fall asleep Final awakening between 6 and 7 AM  Never smoker  History of hypertension, asthma, high cholesterol  Is currently on a course of prednisone, uses Breztri  Outpatient Encounter Medications as of 07/20/2023  Medication Sig   albuterol (VENTOLIN HFA) 108 (90 Base) MCG/ACT inhaler INHALE 1-2 PUFFS BY MOUTH EVERY 4 HOURS AS NEEDED FOR WHEEZING   aspirin EC 81 MG tablet Take 81 mg by mouth daily. Swallow whole.   Baclofen 5 MG TABS Take 1 tablet by mouth 2 (two) times daily as needed (Muscle spasms).   Budeson-Glycopyrrol-Formoterol (BREZTRI AEROSPHERE) 160-9-4.8 MCG/ACT AERO Inhale 2 puffs into the lungs in the morning and at bedtime.   Budeson-Glycopyrrol-Formoterol (BREZTRI AEROSPHERE) 160-9-4.8 MCG/ACT AERO Inhale 2 puffs into the lungs in the morning and at bedtime.   cetirizine (ZYRTEC) 10 MG tablet Take 1 tablet (10 mg total) by mouth daily.   doxycycline (ADOXA) 100 MG  tablet Take 100 mg by mouth 2 (two) times daily.   fluticasone (FLONASE) 50 MCG/ACT nasal spray Place 2 sprays into both nostrils daily.   furosemide (LASIX) 20 MG tablet TAKE 1 TABLET BY MOUTH ONCE DAILY FOR  ONE  WEEK  THEN  AS  NEEDED  FOR  SWELLING (Patient taking differently: Take 20 mg by mouth daily as needed for edema.)   gabapentin (NEURONTIN) 300 MG capsule Take 300 mg by mouth daily.   hydrochlorothiazide (HYDRODIURIL) 12.5 MG tablet TAKE 1 TABLET BY MOUTH  DAILY (Patient taking differently: Take 12.5 mg by mouth daily.)   ipratropium-albuterol (DUONEB) 0.5-2.5 (3) MG/3ML SOLN Take 3 mLs by nebulization every 4 (four) hours as needed.   levothyroxine (SYNTHROID) 25 MCG tablet Take 25 mcg by mouth every morning.   lisinopril-hydrochlorothiazide (ZESTORETIC) 10-12.5 MG tablet Take 1 tablet by mouth daily.   Mepolizumab (NUCALA) 100 MG/ML SOAJ Inject 1 mL (100 mg total) into the skin every 28 (twenty-eight) days. **patient assistance program**   metFORMIN (GLUCOPHAGE-XR) 500 MG 24 hr tablet TAKE 1 TABLET BY MOUTH DAILY WITH BREAKFAST   montelukast (SINGULAIR) 10 MG tablet Take 1 tablet (10 mg total) by mouth at bedtime.   naloxone (NARCAN) nasal spray 4 mg/0.1 mL Place 1 spray into the nose once.   pravastatin (PRAVACHOL) 40 MG tablet Take 1 tablet (40 mg total) by mouth at bedtime.   tadalafil (CIALIS) 10 MG tablet Take 10 mg by mouth daily.   No facility-administered encounter  medications on file as of 07/20/2023.    Allergies as of 07/20/2023 - Review Complete 07/20/2023  Allergen Reaction Noted   Phenergan [promethazine hcl] Itching and Other (See Comments) 09/15/2015    Past Medical History:  Diagnosis Date   Allergy    Arthritis    "left knee" (01/22/2016)   Asthma    Chronic lower back pain    COPD (chronic obstructive pulmonary disease) (HCC)    Dyspnea    with extertion   High cholesterol    Hypertension    Neuropathy    OSA on CPAP    Secondhand smoke exposure     Sleep apnea    wears CPAP, not wearing now  for months   Type II diabetes mellitus (HCC) dx'd 01/17/2016    Past Surgical History:  Procedure Laterality Date   ANTERIOR CERVICAL DECOMP/DISCECTOMY FUSION  ~ 1995   "titanium"   APPENDECTOMY  ~ 1977   BACK SURGERY     x 2  L 4disectomy   COLONOSCOPY W/ POLYPECTOMY     INGUINAL HERNIA REPAIR Right ~ 2000   INSERTION OF MESH N/A 06/09/2022   Procedure: INSERTION OF MESH;  Surgeon: Griselda Miner, MD;  Location: [facility];  Service: General;  Laterality: N/A;   LUMBAR DISC SURGERY  ~ 2000; ~2006   NASAL SINUS SURGERY  ~ 2007   TOTAL KNEE ARTHROPLASTY Left 08/06/2017   Procedure: LEFT TOTAL KNEE ARTHROPLASTY;  Surgeon: Tarry Kos, MD;  Location: [facility];  Service: Orthopedics;  Laterality: Left;   UMBILICAL HERNIA REPAIR N/A 06/09/2022   Procedure: HERNIA REPAIR UMBILICAL WITH MESH;  Surgeon: Griselda Miner, MD;  Location: [facility];  Service: General;  Laterality: N/A;    Family History  Problem Relation Age of Onset   Hypertension Mother    Hypertension Father    Heart Problems Brother        Heart transplant, pt does not know cause    Rectal cancer Brother    Colon cancer Neg Hx    Esophageal cancer Neg Hx    Stomach cancer Neg Hx     Social History   Socioeconomic History   Marital status: Married    Spouse name: Not on file   Number of children: Not on file   Years of education: Not on file   Highest education level: Not on file  Occupational History   Not on file  Tobacco Use   Smoking status: Never    Passive exposure: Yes   Smokeless tobacco: Never   Tobacco comments:    passive cig smoking at work  1994- 2012  Vaping Use   Vaping status: Never Used  Substance and Sexual Activity   Alcohol use: No   Drug use: No   Sexual activity: Yes  Other Topics Concern   Not on file  Social History Narrative   Not on file   Social Drivers of Health   Financial Resource Strain: High  Risk (01/19/2021)   Overall Financial Resource Strain (CARDIA)    Difficulty of Paying Living Expenses: Hard  Food Insecurity: No Food Insecurity (01/17/2022)   Hunger Vital Sign    Worried About Running Out of Food in the Last Year: Never true    Ran Out of Food in the Last Year: Never true  Transportation Needs: No Transportation Needs (01/17/2022)   PRAPARE - Administrator, Civil Service (Medical): No    Lack of Transportation (Non-Medical):  No  Physical Activity: Inactive (01/19/2021)   Exercise Vital Sign    Days of Exercise per Week: 0 days    Minutes of Exercise per Session: 0 min  Stress: No Stress Concern Present (01/19/2021)   [employer] of Occupational Health - Occupational Stress Questionnaire    Feeling of Stress : Not at all  Social Connections: Moderately Isolated (01/19/2021)   Social Connection and Isolation Panel [NHANES]    Frequency of Communication with Friends and Family: Three times a week    Frequency of Social Gatherings with Friends and Family: Once a week    Attends Religious Services: Never    Database administrator or Organizations: No    Attends Banker Meetings: Never    Marital Status: Married  Catering manager Violence: Not At Risk (01/17/2022)   Humiliation, Afraid, Rape, and Kick questionnaire    Fear of Current or Ex-Partner: No    Emotionally Abused: No    Physically Abused: No    Sexually Abused: No    Review of Systems  Constitutional:  Positive for fatigue.  HENT: Negative.    Eyes: Negative.   Respiratory:  Positive for cough, chest tightness and wheezing. Negative for shortness of breath.   Cardiovascular: Negative.   Gastrointestinal: Negative.   Endocrine: Negative.   Genitourinary: Negative.     There were no vitals filed for this visit.    Physical Exam Constitutional:      Appearance: Normal appearance. He is well-developed.  HENT:     Head: Normocephalic and atraumatic.     Mouth/Throat:      Mouth: Mucous membranes are moist.  Eyes:     General:        Right eye: No discharge.        Left eye: No discharge.  Neck:     Thyroid: No thyromegaly.     Trachea: No tracheal deviation.  Cardiovascular:     Rate and Rhythm: Normal rate and regular rhythm.     Heart sounds: No murmur heard.    No friction rub.  Pulmonary:     Effort: Pulmonary effort is normal. No respiratory distress.     Breath sounds: Normal breath sounds. No stridor. No wheezing, rhonchi or rales.  Musculoskeletal:     Cervical back: No rigidity or tenderness.  Neurological:     Mental Status: He is alert.  Psychiatric:        Mood and Affect: Mood normal.     Data Reviewed: Compliance of 73% AutoSet 5-15 95 percentile pressure of 12.7 Residual AHI 0.5  Assessment:  Mild obstructive sleep apnea -Continue using CPAP  Eosinophilic asthma -Nucala was just renewed and has taken first dose ,will continue using it -Continue Breztri  Bronchitis -Course of antibiotics, steroids  Cough medicine   Plan/Recommendations: Follow-up in about 6 weeks  Will consider CT scan of the chest if nonresolution of symptoms  PFT at next visit  Continue bronchodilators, steroids, antibiotics  Encouraged to call with significant concerns  Virl Diamond MD [facility] 07/20/2023, 2:03 PM  CC: No ref. provider found

## 2023-07-20 NOTE — Patient Instructions (Signed)
 I called in antibiotics, Steroids.the cough medicine  Continue using Nucala regularly Continue your Breztri and nebulizer as needed  Continue using your CPAP on a nightly basis  Breathing studies ordered-we want to have this done whenever you are feeling more stable  If not feeling better despite treatment, a CAT scan can be considered to see if it gives [place] any new information  Call us with significant concerns

## 2023-08-11 NOTE — Telephone Encounter (Signed)
 [facility]

## 2023-09-16 ENCOUNTER — Encounter (HOSPITAL_BASED_OUTPATIENT_CLINIC_OR_DEPARTMENT_OTHER)

## 2023-09-23 ENCOUNTER — Encounter (HOSPITAL_BASED_OUTPATIENT_CLINIC_OR_DEPARTMENT_OTHER)

## 2023-09-23 ENCOUNTER — Ambulatory Visit: Admitting: Pulmonary Disease

## 2023-09-29 ENCOUNTER — Telehealth: Payer: Self-pay

## 2023-09-29 NOTE — Telephone Encounter (Signed)
 Copied from CRM [id number]. Topic: Appointments - Scheduling Inquiry for Clinic >> Sep 28, 2023 12:45 PM Christian [name] wrote: Reason for CRM: Patient [phone] wants to speak with nurse on changing appointments 10/01/23 PFT at 10:30 am in [facility] and with Dr. Gaynell Keeler at 1:30 pm. Patient wants an early morning appointments, patient is going out of town 10/01/23. Patient does not want to cancel this appointments until he speaks with the nurse, asking to have a call back today. Unable to schedule due to [employer] is showing no more solutions for provider after 10/01/23. Please advise.  Routing to front staff, please advise.

## 2023-09-29 NOTE — Telephone Encounter (Signed)
 Attempted to call patient. Left voicemail letting patient know that there are no earlier appointments with Dr.Olalere .

## 2023-10-01 ENCOUNTER — Ambulatory Visit: Admitting: Pulmonary Disease

## 2023-10-01 ENCOUNTER — Encounter (HOSPITAL_BASED_OUTPATIENT_CLINIC_OR_DEPARTMENT_OTHER)

## 2023-10-14 ENCOUNTER — Encounter

## 2023-10-26 NOTE — Telephone Encounter (Addendum)
 Called [facility] for Nucala  patient assistance application status  Received verbal notification from  [employer] regarding an approval for NUCALA  patient assistance from 07/06/2023 to 04/27/2024. Rep will refax approval letter  Phone #: [phone] Fax #: [phone]  Of note, patient must apply for LIS and denied to be approved for Nucala  PAP next year. He should be able to start applying for LIS as early at October 2025. This information is sent in approval letter to patient and patient was advised during enrollment verification call.  Sherry Pennant, PharmD, MPH, BCPS, CPP Clinical Pharmacist (Rheumatology and Pulmonology)

## 2023-11-12 ENCOUNTER — Ambulatory Visit (INDEPENDENT_AMBULATORY_CARE_PROVIDER_SITE_OTHER): Admitting: Pulmonary Disease

## 2023-11-12 ENCOUNTER — Ambulatory Visit: Admitting: Pulmonary Disease

## 2023-11-12 ENCOUNTER — Other Ambulatory Visit: Payer: Self-pay

## 2023-11-12 ENCOUNTER — Other Ambulatory Visit (HOSPITAL_COMMUNITY): Payer: Self-pay | Admitting: Family Medicine

## 2023-11-12 VITALS — BP 114/69 | HR 77 | Temp 98.7°F | Ht 68.0 in | Wt 186.0 lb

## 2023-11-12 DIAGNOSIS — R0600 Dyspnea, unspecified: Secondary | ICD-10-CM

## 2023-11-12 DIAGNOSIS — G4733 Obstructive sleep apnea (adult) (pediatric): Secondary | ICD-10-CM

## 2023-11-12 DIAGNOSIS — R0602 Shortness of breath: Secondary | ICD-10-CM

## 2023-11-12 DIAGNOSIS — J8283 Eosinophilic asthma: Secondary | ICD-10-CM

## 2023-11-12 DIAGNOSIS — R059 Cough, unspecified: Secondary | ICD-10-CM

## 2023-11-12 LAB — PULMONARY FUNCTION TEST
DL/VA % pred: 105 %
DL/VA: 4.46 ml/min/mmHg/L
DLCO cor % pred: 86 %
DLCO cor: 22.05 ml/min/mmHg
DLCO unc % pred: 86 %
DLCO unc: 22.05 ml/min/mmHg
FEF 25-75 Post: 1.66 L/s
FEF 25-75 Pre: 1.41 L/s
FEF2575-%Change-Post: 17 %
FEF2575-%Pred-Post: 63 %
FEF2575-%Pred-Pre: 53 %
FEV1-%Change-Post: 3 %
FEV1-%Pred-Post: 69 %
FEV1-%Pred-Pre: 66 %
FEV1-Post: 2.26 L
FEV1-Pre: 2.17 L
FEV1FVC-%Change-Post: -3 %
FEV1FVC-%Pred-Pre: 93 %
FEV6-%Change-Post: 2 %
FEV6-%Pred-Post: 76 %
FEV6-%Pred-Pre: 75 %
FEV6-Post: 3.17 L
FEV6-Pre: 3.11 L
FEV6FVC-%Change-Post: -1 %
FEV6FVC-%Pred-Post: 103 %
FEV6FVC-%Pred-Pre: 105 %
FVC-%Change-Post: 7 %
FVC-%Pred-Post: 76 %
FVC-%Pred-Pre: 71 %
FVC-Post: 3.34 L
FVC-Pre: 3.11 L
Post FEV1/FVC ratio: 68 %
Post FEV6/FVC ratio: 99 %
Pre FEV1/FVC ratio: 70 %
Pre FEV6/FVC Ratio: 100 %
RV % pred: 68 %
RV: 1.5 L
TLC % pred: 79 %
TLC: 5.25 L

## 2023-11-12 MED ORDER — BREZTRI AEROSPHERE 160-9-4.8 MCG/ACT IN AERO: 2.0000 | INHALATION_SPRAY | Freq: Two times a day (BID) | RESPIRATORY_TRACT | 6 refills | Status: AC

## 2023-11-12 NOTE — Progress Notes (Signed)
 Full PFT performed today.

## 2023-11-12 NOTE — Patient Instructions (Signed)
 Schedule you for ultrasound of the heart  Your breathing study does show some narrowing in the airway - Continue your Breztri , continue breathing treatments with albuterol   Continue graded activities as tolerated  We will hold off on the CT scan of the chest at present, but we will consider this if your symptoms do not continue to improve  Continue using Nucala   Follow-up in 3 months  Call us  with significant concerns

## 2023-11-12 NOTE — Patient Instructions (Signed)
 Full PFT performed today.

## 2023-11-12 NOTE — Progress Notes (Signed)
 Christian [name]    [id number]    [birth year]  Primary Care Physician:Pcp, No  Referring Physician: Neda Jennet LABOR, MD [address] [place],  [place] [address]  Chief complaint:   Patient being followed up for asthma, obstructive sleep apnea  HPI:  In for follow-up today  Breathing feels about the same Uses Breztri  regularly, uses nebulization treatments usually first thing in the morning and may need it during the day as well  Still describes significant shortness of breath Waking up getting off his CPAP and still feeling winded States this happens up to twice a week  He is using CPAP nightly and tolerating it well  Does get short of breath with activity  Has a history of eosinophilic asthma for which he is on Nucala , compliant with inhalers  Obstructive sleep apnea was diagnosed over 10 years ago, it was mild sleep apnea  Usually goes to bed between 10 and 11:30 PM Takes him between 15 and 30 minutes to fall asleep Final awakening between 6 and 7 AM  Never smoker  History of hypertension, asthma, high cholesterol  Outpatient Encounter Medications as of 11/12/2023  Medication Sig   albuterol  (VENTOLIN  HFA) 108 (90 Base) MCG/ACT inhaler INHALE 1-2 PUFFS BY MOUTH EVERY 4 HOURS AS NEEDED FOR WHEEZING   aspirin  EC 81 MG tablet Take 81 mg by mouth daily. Swallow whole.   Baclofen  5 MG TABS Take 1 tablet by mouth 2 (two) times daily as needed (Muscle spasms).   benzonatate  (TESSALON ) 200 MG capsule Take 1 capsule (200 mg total) by mouth 3 (three) times daily as needed for cough.   Budeson-Glycopyrrol-Formoterol  (BREZTRI  AEROSPHERE) 160-9-4.8 MCG/ACT AERO Inhale 2 puffs into the lungs in the morning and at bedtime.   Budeson-Glycopyrrol-Formoterol  (BREZTRI  AEROSPHERE) 160-9-4.8 MCG/ACT AERO Inhale 2 puffs into the lungs in the morning and at bedtime.   cetirizine  (ZYRTEC ) 10 MG tablet Take 1 tablet (10 mg total) by mouth daily.   doxycycline  (ADOXA)  100 MG tablet Take 100 mg by mouth 2 (two) times daily.   doxycycline  (VIBRA -TABS) 100 MG tablet Take 1 tablet (100 mg total) by mouth 2 (two) times daily.   fluticasone  (FLONASE ) 50 MCG/ACT nasal spray Place 2 sprays into both nostrils daily.   furosemide  (LASIX ) 20 MG tablet TAKE 1 TABLET BY MOUTH ONCE DAILY FOR  ONE  WEEK  THEN  AS  NEEDED  FOR  SWELLING (Patient taking differently: Take 20 mg by mouth daily as needed for edema.)   gabapentin  (NEURONTIN ) 300 MG capsule Take 300 mg by mouth daily.   hydrochlorothiazide  (HYDRODIURIL ) 12.5 MG tablet TAKE 1 TABLET BY MOUTH  DAILY (Patient taking differently: Take 12.5 mg by mouth daily.)   ipratropium-albuterol  (DUONEB) 0.5-2.5 (3) MG/3ML SOLN Take 3 mLs by nebulization every 4 (four) hours as needed.   levothyroxine  (SYNTHROID ) 25 MCG tablet Take 25 mcg by mouth every morning.   lisinopril -hydrochlorothiazide  (ZESTORETIC ) 10-12.5 MG tablet Take 1 tablet by mouth daily.   Mepolizumab  (NUCALA ) 100 MG/ML SOAJ Inject 1 mL (100 mg total) into the skin every 28 (twenty-eight) days. **patient assistance program**   metFORMIN  (GLUCOPHAGE -XR) 500 MG 24 hr tablet TAKE 1 TABLET BY MOUTH DAILY WITH BREAKFAST   montelukast  (SINGULAIR ) 10 MG tablet Take 1 tablet (10 mg total) by mouth at bedtime.   naloxone (NARCAN) nasal spray 4 mg/0.1 mL Place 1 spray into the nose once.   pravastatin  (PRAVACHOL ) 40 MG tablet Take  1 tablet (40 mg total) by mouth at bedtime.   predniSONE  (DELTASONE ) 20 MG tablet Take 1 tablet (20 mg total) by mouth daily with breakfast.   tadalafil (CIALIS) 10 MG tablet Take 10 mg by mouth daily.   No facility-administered encounter medications on file as of 11/12/2023.    Allergies as of 11/12/2023 - Review Complete 11/12/2023  Allergen Reaction Noted   Phenergan [promethazine hcl] Itching and Other (See Comments) 09/15/2015    Past Medical History:  Diagnosis Date   Allergy    Arthritis    left knee (01/22/2016)   Asthma     Chronic lower back pain    COPD (chronic obstructive pulmonary disease) (HCC)    Dyspnea    with extertion   High cholesterol    Hypertension    Neuropathy    OSA on CPAP    Secondhand smoke exposure    Sleep apnea    wears CPAP, not wearing now  for months   Type II diabetes mellitus (HCC) dx'd 01/17/2016    Past Surgical History:  Procedure Laterality Date   ANTERIOR CERVICAL DECOMP/DISCECTOMY FUSION  ~ 1995   titanium   APPENDECTOMY  ~ 1977   BACK SURGERY     x 2  L 4disectomy   COLONOSCOPY W/ POLYPECTOMY     INGUINAL HERNIA REPAIR Right ~ 2000   INSERTION OF MESH N/A 06/09/2022   Procedure: INSERTION OF MESH;  Surgeon: Curvin Deward MOULD, MD;  Location: [facility];  Service: General;  Laterality: N/A;   LUMBAR DISC SURGERY  ~ 2000; ~2006   NASAL SINUS SURGERY  ~ 2007   TOTAL KNEE ARTHROPLASTY Left 08/06/2017   Procedure: LEFT TOTAL KNEE ARTHROPLASTY;  Surgeon: Jerri Kay HERO, MD;  Location: [facility];  Service: Orthopedics;  Laterality: Left;   UMBILICAL HERNIA REPAIR N/A 06/09/2022   Procedure: HERNIA REPAIR UMBILICAL WITH MESH;  Surgeon: Curvin Deward MOULD, MD;  Location: [facility];  Service: General;  Laterality: N/A;    Family History  Problem Relation Age of Onset   Hypertension Mother    Hypertension Father    Heart Problems Brother        Heart transplant, pt does not know cause    Rectal cancer Brother    Colon cancer Neg Hx    Esophageal cancer Neg Hx    Stomach cancer Neg Hx     Social History   Socioeconomic History   Marital status: Married    Spouse name: Not on file   Number of children: Not on file   Years of education: Not on file   Highest education level: Not on file  Occupational History   Not on file  Tobacco Use   Smoking status: Never    Passive exposure: Yes   Smokeless tobacco: Never   Tobacco comments:    passive cig smoking at work  1994- 2012  Vaping Use   Vaping status: Never Used  Substance and Sexual  Activity   Alcohol use: No   Drug use: No   Sexual activity: Yes  Other Topics Concern   Not on file  Social History Narrative   Not on file   Social Drivers of Health   Financial Resource Strain: High Risk (01/19/2021)   Overall Financial Resource Strain (CARDIA)    Difficulty of Paying Living Expenses: Hard  Food Insecurity: No Food Insecurity (01/17/2022)   Hunger Vital Sign    Worried About Running Out of Food in  the Last Year: Never true    Ran Out of Food in the Last Year: Never true  Transportation Needs: No Transportation Needs (01/17/2022)   PRAPARE - Administrator, Civil Service (Medical): No    Lack of Transportation (Non-Medical): No  Physical Activity: Inactive (01/19/2021)   Exercise Vital Sign    Days of Exercise per Week: 0 days    Minutes of Exercise per Session: 0 min  Stress: No Stress Concern Present (01/19/2021)   [employer] of Occupational Health - Occupational Stress Questionnaire    Feeling of Stress : Not at all  Social Connections: Moderately Isolated (01/19/2021)   Social Connection and Isolation Panel    Frequency of Communication with Friends and Family: Three times a week    Frequency of Social Gatherings with Friends and Family: Once a week    Attends Religious Services: Never    Database administrator or Organizations: No    Attends Banker Meetings: Never    Marital Status: Married  Catering manager Violence: Not At Risk (01/17/2022)   Humiliation, Afraid, Rape, and Kick questionnaire    Fear of Current or Ex-Partner: No    Emotionally Abused: No    Physically Abused: No    Sexually Abused: No    Review of Systems  Constitutional:  Positive for fatigue.  HENT: Negative.    Eyes: Negative.   Respiratory:  Positive for cough, chest tightness and wheezing. Negative for shortness of breath.   Cardiovascular: Negative.   Gastrointestinal: Negative.   Endocrine: Negative.   Genitourinary: Negative.    Vitals:    11/12/23 1435  BP: 114/69  Pulse: 77  Temp: 98.7 F (37.1 C)  SpO2: 95%   Physical Exam Constitutional:      Appearance: Normal appearance. He is well-developed.  HENT:     Head: Normocephalic and atraumatic.     Mouth/Throat:     Mouth: Mucous membranes are moist.  Eyes:     General:        Right eye: No discharge.        Left eye: No discharge.  Neck:     Thyroid: No thyromegaly.     Trachea: No tracheal deviation.  Cardiovascular:     Rate and Rhythm: Normal rate and regular rhythm.     Heart sounds: No murmur heard.    No friction rub.  Pulmonary:     Effort: Pulmonary effort is normal. No respiratory distress.     Breath sounds: Normal breath sounds. No stridor. No wheezing, rhonchi or rales.  Musculoskeletal:     Cervical back: No rigidity or tenderness.  Neurological:     Mental Status: He is alert.  Psychiatric:        Mood and Affect: Mood normal.   Data Reviewed: CPAP compliance 72% Average use of 5 hours 58 minutes AutoSet 5-15 residual AHI 1.0 95 percentile pressure of 12.9  PFT reviewed showing moderate obstruction with no significant bronchodilator response, mild restriction with normal diffusing capacity  Assessment:  Mild obstructive sleep apnea - Continue CPAP use  Eosinophilic asthma - Continue Nucala  Continue Breztri   Persistent shortness of breath with wheezing - Cardiac assessment - Will obtain an echocardiogram    Plan/Recommendations: Continue Nucala  Continue Breztri  Obtain echocardiogram  Encouraged to give us  a call in about 3 months  Encouraged to continue using CPAP on a nightly basis  Continue graded activities as tolerated  Jennet Epley MD [facility] 11/12/2023,  3:07 PM  CC: Neda Jennet LABOR, MD

## 2023-12-04 ENCOUNTER — Ambulatory Visit (HOSPITAL_COMMUNITY): Admission: RE | Admit: 2023-12-04 | Source: Ambulatory Visit

## 2023-12-04 ENCOUNTER — Ambulatory Visit
Admission: RE | Admit: 2023-12-04 | Discharge: 2023-12-04 | Disposition: A | Discharge status: Home / Self Care | Source: Ambulatory Visit | Attending: Family Medicine | Admitting: Family Medicine

## 2023-12-04 DIAGNOSIS — R059 Cough, unspecified: Secondary | ICD-10-CM

## 2023-12-07 ENCOUNTER — Encounter (HOSPITAL_COMMUNITY): Payer: Self-pay | Admitting: Pulmonary Disease

## 2023-12-15 ENCOUNTER — Ambulatory Visit (HOSPITAL_COMMUNITY)
Admission: RE | Admit: 2023-12-15 | Discharge: 2023-12-15 | Disposition: A | Discharge status: Home / Self Care | Source: Ambulatory Visit | Attending: Internal Medicine | Admitting: Internal Medicine

## 2023-12-15 DIAGNOSIS — R0602 Shortness of breath: Secondary | ICD-10-CM

## 2023-12-15 DIAGNOSIS — R0600 Dyspnea, unspecified: Secondary | ICD-10-CM | POA: Diagnosis present

## 2023-12-15 LAB — ECHOCARDIOGRAM COMPLETE
Area-P 1/2: 2.93 cm2
S' Lateral: 2.7 cm

## 2023-12-18 ENCOUNTER — Other Ambulatory Visit: Payer: Self-pay | Admitting: Internal Medicine

## 2023-12-18 DIAGNOSIS — M1712 Unilateral primary osteoarthritis, left knee: Secondary | ICD-10-CM

## 2023-12-31 ENCOUNTER — Other Ambulatory Visit: Payer: Self-pay | Admitting: Pulmonary Disease

## 2023-12-31 DIAGNOSIS — J4541 Moderate persistent asthma with (acute) exacerbation: Secondary | ICD-10-CM

## 2023-12-31 NOTE — Telephone Encounter (Signed)
 Copied from CRM [id number]. Topic: Clinical - Medication Refill >> Dec 31, 2023  9:21 AM Isabell A wrote: Medication: Mepolizumab  (NUCALA ) 100 MG/ML SOAJ  Has the patient contacted their pharmacy? No (Agent: If no, request that the patient contact the pharmacy for the refill. If patient does not wish to contact the pharmacy document the reason why and proceed with request.) (Agent: If yes, when and what did the pharmacy advise?)  This is the patient's preferred pharmacy:  [facility] [place], [place] - [name] [place] [address] Phone: [phone] Fax: [phone]   Is this the correct pharmacy for this prescription? Yes If no, delete pharmacy and type the correct one.   Has the prescription been filled recently? Yes  Is the patient out of the medication? Yes  Has the patient been seen for an appointment in the last year OR does the patient have an upcoming appointment? Yes  Can we respond through MyChart? No  Agent: Please be advised that Rx refills may take up to 3 business days. We ask that you follow-up with your pharmacy.

## 2024-01-04 NOTE — Telephone Encounter (Signed)
 Nucala  refill faxed to [facility].   Fax # [phone].  Phone # [phone]  Aleck Puls, PharmD, BCPS Clinical Pharmacist  [facility]

## 2024-01-11 ENCOUNTER — Telehealth (HOSPITAL_BASED_OUTPATIENT_CLINIC_OR_DEPARTMENT_OTHER): Payer: Self-pay

## 2024-01-11 ENCOUNTER — Telehealth: Payer: Self-pay | Admitting: Pulmonary Disease

## 2024-01-11 DIAGNOSIS — J4541 Moderate persistent asthma with (acute) exacerbation: Secondary | ICD-10-CM

## 2024-01-11 NOTE — Telephone Encounter (Signed)
 Copied from CRM [id number]. Topic: Clinical - Medication Refill >> Jan 11, 2024 12:52 PM Rilla B wrote: Medication:  Mepolizumab  (NUCALA ) 100 MG/ML SOAJ  Has the patient contacted their pharmacy? Yes (Agent: If no, request that the patient contact the pharmacy for the refill. If patient does not wish to contact the pharmacy document the reason why and proceed with request.) (Agent: If yes, when and what did the pharmacy advise?)  This is the patient's preferred pharmacy:   [facility] - [place], [place] - [name] [place] [address] Phone: [phone] Fax: [phone]   Is this the correct pharmacy for this prescription? Yes If no, delete pharmacy and type the correct one.   Has the prescription been filled recently? Yes  Is the patient out of the medication? Yes  Has the patient been seen for an appointment in the last year OR does the patient have an upcoming appointment? Yes  Can we respond through MyChart? No  Agent: Please be advised that Rx refills may take up to 3 business days. We ask that you follow-up with your pharmacy.

## 2024-01-11 NOTE — Telephone Encounter (Signed)
 Copied from CRM #[id number]. Topic: Clinical - Lab/Test Results >> Jan 11, 2024 10:45 AM Hillary B wrote: Reason for CRM: Patient stated that he received an xray of his heart around 2 weeks ago and would like to discuss the results, as he has not heard back in regard to his xray. He declined scheduling a visit at this time as he would like to just receive a call back if possible. Please review and contact the patient back. Best call back number is [phone]. Thanks team!

## 2024-01-13 NOTE — Telephone Encounter (Signed)
 Nucala  refills faxed to [facility] on 01/04/24. Called [facility], per rep, the Rx was sent to patient on 01/11/24. Rep confirmed receipt of our fax with Nucala  refills. No further action needed.   Aleck Puls, PharmD, BCPS Clinical Pharmacist  [facility]

## 2024-02-11 NOTE — Telephone Encounter (Signed)
 LVM with pt, no record of us  having called him.

## 2024-02-11 NOTE — Telephone Encounter (Signed)
 Patient is returning call he received . Called cal line and no answered please give patient a call back to discuss medication [phone]

## 2024-02-12 ENCOUNTER — Ambulatory Visit: Admitting: Pulmonary Disease

## 2024-03-29 ENCOUNTER — Telehealth: Payer: Self-pay

## 2024-03-29 NOTE — Telephone Encounter (Signed)
 Patient will need to refill out nucala  paperwork per [employer] at next appointment

## 2024-03-31 ENCOUNTER — Telehealth: Payer: Self-pay

## 2024-03-31 NOTE — Telephone Encounter (Signed)
 Reached out to pt because according to notes from last year pt must be denied for LIS before being considered for PAP renewal. He stated he applied a few months ago and was denied. Unsure if the denial will still be accepted. He states there are no changes to his income or insurance. Advised pt I will attempt to submit for renewal and reach out if we need anything additional.  Submitted a Prior Authorization request to [employer] for NUCALA  via CoverMyMeds. Will update once we receive a response.  Key: [id number]

## 2024-04-04 ENCOUNTER — Emergency Department (HOSPITAL_COMMUNITY)

## 2024-04-04 ENCOUNTER — Emergency Department (HOSPITAL_COMMUNITY)
Admission: EM | Admit: 2024-04-04 | Discharge: 2024-04-04 | Disposition: A | Discharge status: Home / Self Care | Attending: Emergency Medicine | Admitting: Emergency Medicine

## 2024-04-04 ENCOUNTER — Other Ambulatory Visit: Payer: Self-pay

## 2024-04-04 ENCOUNTER — Encounter (HOSPITAL_COMMUNITY): Payer: Self-pay

## 2024-04-04 DIAGNOSIS — R0602 Shortness of breath: Secondary | ICD-10-CM | POA: Insufficient documentation

## 2024-04-04 DIAGNOSIS — J441 Chronic obstructive pulmonary disease with (acute) exacerbation: Secondary | ICD-10-CM

## 2024-04-04 DIAGNOSIS — J4489 Other specified chronic obstructive pulmonary disease: Secondary | ICD-10-CM | POA: Insufficient documentation

## 2024-04-04 DIAGNOSIS — I10 Essential (primary) hypertension: Secondary | ICD-10-CM | POA: Insufficient documentation

## 2024-04-04 DIAGNOSIS — R079 Chest pain, unspecified: Secondary | ICD-10-CM | POA: Insufficient documentation

## 2024-04-04 DIAGNOSIS — E119 Type 2 diabetes mellitus without complications: Secondary | ICD-10-CM | POA: Insufficient documentation

## 2024-04-04 LAB — I-STAT VENOUS BLOOD GAS, ED
Acid-Base Excess: 5 mmol/L — ABNORMAL HIGH (ref 0.0–2.0)
Bicarbonate: 28.2 mmol/L — ABNORMAL HIGH (ref 20.0–28.0)
Calcium, Ion: 1.1 mmol/L — ABNORMAL LOW (ref 1.15–1.40)
HCT: 44 % (ref 39.0–52.0)
Hemoglobin: 15 g/dL (ref 13.0–17.0)
O2 Saturation: 98 %
Potassium: 4 mmol/L (ref 3.5–5.1)
Sodium: 140 mmol/L (ref 135–145)
TCO2: 29 mmol/L (ref 22–32)
pCO2, Ven: 36.8 mmHg — ABNORMAL LOW (ref 44–60)
pH, Ven: 7.492 — ABNORMAL HIGH (ref 7.25–7.43)
pO2, Ven: 100 mmHg — ABNORMAL HIGH (ref 32–45)

## 2024-04-04 LAB — COMPREHENSIVE METABOLIC PANEL WITH GFR
ALT: 21 U/L (ref 0–44)
AST: 24 U/L (ref 15–41)
Albumin: 3.9 g/dL (ref 3.5–5.0)
Alkaline Phosphatase: 44 U/L (ref 38–126)
Anion gap: 10 (ref 5–15)
BUN: 16 mg/dL (ref 8–23)
CO2: 26 mmol/L (ref 22–32)
Calcium: 9.2 mg/dL (ref 8.9–10.3)
Chloride: 106 mmol/L (ref 98–111)
Creatinine, Ser: 1.43 mg/dL — ABNORMAL HIGH (ref 0.61–1.24)
GFR, Estimated: 55 mL/min — ABNORMAL LOW (ref >60)
Glucose, Bld: 152 mg/dL — ABNORMAL HIGH (ref 70–99)
Potassium: 3.9 mmol/L (ref 3.5–5.1)
Sodium: 142 mmol/L (ref 135–145)
Total Bilirubin: 1.2 mg/dL (ref 0.0–1.2)
Total Protein: 7.5 g/dL (ref 6.5–8.1)

## 2024-04-04 LAB — CBC WITH DIFFERENTIAL/PLATELET
Abs Immature Granulocytes: 0.04 K/uL (ref 0.00–0.07)
Basophils Absolute: 0 K/uL (ref 0.0–0.1)
Basophils Relative: 1 %
Eosinophils Absolute: 0 K/uL (ref 0.0–0.5)
Eosinophils Relative: 1 %
HCT: 44.1 % (ref 39.0–52.0)
Hemoglobin: 14.5 g/dL (ref 13.0–17.0)
Immature Granulocytes: 1 %
Lymphocytes Relative: 37 %
Lymphs Abs: 1.4 K/uL (ref 0.7–4.0)
MCH: 29 pg (ref 26.0–34.0)
MCHC: 32.9 g/dL (ref 30.0–36.0)
MCV: 88.2 fL (ref 80.0–100.0)
Monocytes Absolute: 0.3 K/uL (ref 0.1–1.0)
Monocytes Relative: 8 %
Neutro Abs: 2 K/uL (ref 1.7–7.7)
Neutrophils Relative %: 52 %
Platelets: 188 K/uL (ref 150–400)
RBC: 5 MIL/uL (ref 4.22–5.81)
RDW: 14.4 % (ref 11.5–15.5)
WBC: 3.8 K/uL — ABNORMAL LOW (ref 4.0–10.5)
nRBC: 0 % (ref 0.0–0.2)

## 2024-04-04 LAB — BRAIN NATRIURETIC PEPTIDE: B Natriuretic Peptide: 9 pg/mL (ref 0.0–100.0)

## 2024-04-04 LAB — RESP PANEL BY RT-PCR (RSV, FLU A&B, COVID)  RVPGX2
Influenza A by PCR: NEGATIVE
Influenza B by PCR: NEGATIVE
Resp Syncytial Virus by PCR: NEGATIVE
SARS Coronavirus 2 by RT PCR: NEGATIVE

## 2024-04-04 LAB — TROPONIN I (HIGH SENSITIVITY)
Troponin I (High Sensitivity): 5 ng/L (ref <18)
Troponin I (High Sensitivity): 4 ng/L (ref <18)

## 2024-04-04 LAB — D-DIMER, QUANTITATIVE: D-Dimer, Quant: 0.71 ug{FEU}/mL — ABNORMAL HIGH (ref 0.00–0.50)

## 2024-04-04 MED ORDER — IPRATROPIUM-ALBUTEROL 0.5-2.5 (3) MG/3ML IN SOLN
3.0000 mL | RESPIRATORY_TRACT | Status: AC
  Administered 2024-04-04 (×3): 3 mL via RESPIRATORY_TRACT
  Filled 2024-04-04: qty 9

## 2024-04-04 MED ORDER — IPRATROPIUM BROMIDE 0.02 % IN SOLN
1.0000 mg | Freq: Once | RESPIRATORY_TRACT | Status: AC
  Administered 2024-04-04: 1 mg via RESPIRATORY_TRACT
  Filled 2024-04-04: qty 5

## 2024-04-04 MED ORDER — SALINE SPRAY 0.65 % NA SOLN
1.0000 | Freq: Once | NASAL | Status: AC
  Administered 2024-04-04: 1 via NASAL
  Filled 2024-04-04: qty 44

## 2024-04-04 MED ORDER — FLUTICASONE PROPIONATE 50 MCG/ACT NA SUSP
2.0000 | Freq: Every day | NASAL | Status: DC
  Administered 2024-04-04: 2 via NASAL
  Filled 2024-04-04: qty 16

## 2024-04-04 MED ORDER — METHYLPREDNISOLONE SODIUM SUCC 125 MG IJ SOLR
125.0000 mg | Freq: Once | INTRAMUSCULAR | Status: AC
  Administered 2024-04-04: 125 mg via INTRAVENOUS
  Filled 2024-04-04: qty 2

## 2024-04-04 MED ORDER — ALBUTEROL SULFATE (2.5 MG/3ML) 0.083% IN NEBU
10.0000 mg | INHALATION_SOLUTION | Freq: Once | RESPIRATORY_TRACT | Status: AC
  Administered 2024-04-04: 10 mg via RESPIRATORY_TRACT
  Filled 2024-04-04 (×2): qty 12

## 2024-04-04 MED ORDER — ALBUTEROL SULFATE HFA 108 (90 BASE) MCG/ACT IN AERS
4.0000 | INHALATION_SPRAY | Freq: Once | RESPIRATORY_TRACT | Status: AC
  Administered 2024-04-04: 4 via RESPIRATORY_TRACT
  Filled 2024-04-04: qty 6.7

## 2024-04-04 MED ORDER — IOHEXOL 350 MG/ML SOLN
75.0000 mL | Freq: Once | INTRAVENOUS | Status: AC | PRN
  Administered 2024-04-04: 75 mL via INTRAVENOUS

## 2024-04-04 MED ORDER — IPRATROPIUM-ALBUTEROL 0.5-2.5 (3) MG/3ML IN SOLN: 3.0000 mL | RESPIRATORY_TRACT | Status: DC

## 2024-04-04 NOTE — ED Notes (Signed)
 Pt transported to xray

## 2024-04-04 NOTE — ED Triage Notes (Signed)
 Pt has hx of asthma and states he is SOB and having chest tightness, inhaler and breathing treatments have not worked at home. Pt does not appear in any obvious distress at this time.

## 2024-04-04 NOTE — Discharge Instructions (Addendum)
 Christian [name]  Thank you for allowing us  to take care of you today.  You came to the Emergency Department today because you were having shortness of breath, particularly with activity.  Here in the emergency department your breathing was reassuring, your oxygen  was normal.  We did give you some breathing treatments, and you did not have significant wheezing on our reevaluation.  We checked your blood gas to look for a buildup of carbon dioxide, which happens when you have a severe COPD or asthma flare, and there is no evidence of carbon dioxide buildup.  Your heart number is normal, therefore you are not having a heart attack.  Your heart failure/fluid numbers normal, therefore you do not have to much fluid on your body.  There is no evidence of COVID, flu, RSV, or pneumonia.  Your blood clot risk factor number was elevated, therefore we did do a CT scan which did not show any evidence of a blood clot.  Your oxygen  remained normal throughout the time in the emergency department even while walking around.    We gave you a new albuterol  inhaler.  We recommend you do 4 puffs every 6 hours scheduled for the next 48 hours, and thereafter you can use it as needed.  To-Do: 1. Please follow-up with your primary doctor within 1 - 2 weeks / as soon as possible.    Please return to the Emergency Department or call 911 if you experience have worsening of your symptoms, or do not get better, new or different chest pain, shortness of breath, severe or significantly worsening pain, high fever, severe confusion, pass out or have any reason to think that you need emergency medical care.   We hope you feel better soon.   Department of Emergency Medicine [facility] [place]

## 2024-04-04 NOTE — ED Triage Notes (Signed)
 Pt with a hx of asthma and COPD presents with 4 days of gradual onset ShOB, chest tightness. Pt is using PRN inhaler 3-4 times per day and is using his HHN more often than usual with temporary relief.

## 2024-04-04 NOTE — ED Provider Notes (Signed)
 [place] EMERGENCY DEPARTMENT AT [facility] Provider Note   CSN: [id number] Arrival date & time: 04/04/24  1051     Patient presents with: Chest Pain and Shortness of Breath   Christian [name] is a 63 y.o. male.  {Add pertinent medical, surgical, social history, OB history to HPI:32947} HPI     63 year old male with a history of type 2 diabetes, hypertension, hyperlipidemia, COPD/asthma who presents with concern for shortness of breath and chest tightness.  Reports he has had 4 days of symptoms, and has increased the use of his home asthma and COPD medications.  Over the last 4 days his symptoms have gradually worsened.  He has been using his as needed inhaler 3-4 times.  He reports temporary relief with this but symptoms have continued to worsen.  Reports he is coughed about 3-4 times.  He he feels like he could have a cold but also notes he has chronic nasal congestion and sinusitis and it is difficult to tell if it is worse.  He has not had any leg pain or swelling.  He denies known history of blood clots, but he reports that he had a IVC filter placed, but is not sure why ((but this is not seen on CT abdomen pelvis in 2021)) Past Medical History:  Diagnosis Date   Allergy    Arthritis    left knee (01/22/2016)   Asthma    Chronic lower back pain    COPD (chronic obstructive pulmonary disease) (HCC)    Dyspnea    with extertion   High cholesterol    Hypertension    Neuropathy    OSA on CPAP    Secondhand smoke exposure    Sleep apnea    wears CPAP, not wearing now  for months   Type II diabetes mellitus (HCC) dx'd 01/17/2016     Prior to Admission medications   Medication Sig Start Date End Date Taking? Authorizing Provider  albuterol  (VENTOLIN  HFA) 108 (90 Base) MCG/ACT inhaler INHALE 1-2 PUFFS BY MOUTH EVERY 4 HOURS AS NEEDED FOR WHEEZING 02/10/23   Olalere, Adewale A, MD  aspirin  EC 81 MG tablet Take 81 mg by mouth daily. Swallow whole.    [provider]  Baclofen  5 MG TABS Take 1 tablet by mouth 2 (two) times daily as needed (Muscle spasms). 01/06/22   [provider]  benzonatate  (TESSALON ) 200 MG capsule Take 1 capsule (200 mg total) by mouth 3 (three) times daily as needed for cough. 07/20/23   Neda Jennet LABOR, MD  Budeson-Glycopyrrol-Formoterol  (BREZTRI  AEROSPHERE) 160-9-4.8 MCG/ACT AERO Inhale 2 puffs into the lungs in the morning and at bedtime. 04/16/23   Neda Jennet LABOR, MD  budesonide -glycopyrrolate-formoterol  (BREZTRI  AEROSPHERE) 160-9-4.8 MCG/ACT AERO inhaler Inhale 2 puffs into the lungs in the morning and at bedtime. 11/12/23   Olalere, Jennet LABOR, MD  cetirizine  (ZYRTEC ) 10 MG tablet Take 1 tablet (10 mg total) by mouth daily. 01/19/22   Lue Elsie BROCKS, MD  doxycycline  (ADOXA) 100 MG tablet Take 100 mg by mouth 2 (two) times daily. 01/30/23   [provider]  doxycycline  (VIBRA -TABS) 100 MG tablet Take 1 tablet (100 mg total) by mouth 2 (two) times daily. 07/20/23   Neda Jennet LABOR, MD  fluticasone  (FLONASE ) 50 MCG/ACT nasal spray Place 2 sprays into both nostrils daily. 04/24/20   Mayers, Cari S, PA-C  furosemide  (LASIX ) 20 MG tablet TAKE 1 TABLET BY MOUTH ONCE DAILY FOR  ONE  WEEK  THEN  AS  NEEDED  FOR  SWELLING Patient taking differently: Take 20 mg by mouth daily as needed for edema. 04/15/21   Vicci Barnie NOVAK, MD  gabapentin  (NEURONTIN ) 300 MG capsule Take 300 mg by mouth daily.    [provider]  hydrochlorothiazide  (HYDRODIURIL ) 12.5 MG tablet TAKE 1 TABLET BY MOUTH  DAILY Patient taking differently: Take 12.5 mg by mouth daily. 02/06/21   Vicci Barnie NOVAK, MD  ipratropium-albuterol  (DUONEB) 0.5-2.5 (3) MG/3ML SOLN Take 3 mLs by nebulization every 4 (four) hours as needed. 02/10/23   Olalere, Jennet LABOR, MD  levothyroxine  (SYNTHROID ) 25 MCG tablet Take 25 mcg by mouth every morning. 12/13/21   [provider]  lisinopril -hydrochlorothiazide  (ZESTORETIC ) 10-12.5 MG  tablet Take 1 tablet by mouth daily. 05/07/21   Vicci Barnie NOVAK, MD  Mepolizumab  (NUCALA ) 100 MG/ML SOAJ Inject 1 mL (100 mg total) into the skin every 28 (twenty-eight) days. **patient assistance program** 11/26/21   Neda Jennet LABOR, MD  metFORMIN  (GLUCOPHAGE -XR) 500 MG 24 hr tablet TAKE 1 TABLET BY MOUTH DAILY WITH BREAKFAST 12/20/21   Vicci Barnie NOVAK, MD  montelukast  (SINGULAIR ) 10 MG tablet Take 1 tablet (10 mg total) by mouth at bedtime. 05/07/21   Vicci Barnie NOVAK, MD  naloxone [facility]) nasal spray 4 mg/0.1 mL Place 1 spray into the nose once. 01/10/22   [provider]  pravastatin  (PRAVACHOL ) 40 MG tablet Take 1 tablet (40 mg total) by mouth at bedtime. 05/07/21   Vicci Barnie NOVAK, MD  predniSONE  (DELTASONE ) 20 MG tablet Take 1 tablet (20 mg total) by mouth daily with breakfast. 07/20/23   Neda Jennet A, MD  tadalafil (CIALIS) 10 MG tablet Take 10 mg by mouth daily. 12/04/21   [provider]    Allergies: Phenergan [promethazine hcl]    Review of Systems  Updated Vital Signs BP (!) 181/110 (BP Location: Right Arm)   Pulse 88   Temp 98.1 F (36.7 C)   Resp 17   Ht 5' 10 (1.778 m)   Wt 87.1 kg   SpO2 94%   BMI 27.55 kg/m   Physical Exam  (all labs ordered are listed, but only abnormal results are displayed) Labs Reviewed  RESP PANEL BY RT-PCR (RSV, FLU A&B, COVID)  RVPGX2  CBC WITH DIFFERENTIAL/PLATELET  COMPREHENSIVE METABOLIC PANEL WITH GFR  D-DIMER, QUANTITATIVE  BRAIN NATRIURETIC PEPTIDE  TROPONIN I (HIGH SENSITIVITY)    EKG: EKG Interpretation Date/Time:  Monday April 04 2024 11:16:22 EST Ventricular Rate:  87 PR Interval:  206 QRS Duration:  82 QT Interval:  352 QTC Calculation: 423 R Axis:   55  Text Interpretation: Normal sinus rhythm Possible Anterior infarct , age undetermined Abnormal ECG When compared with ECG of 15-Jan-2022 08:55, No significant change since last tracing Confirmed by Dreama Longs ([id number]) on  04/04/2024 12:18:47 PM  Radiology: No results found.  {Document cardiac monitor, telemetry assessment procedure when appropriate:32947} Procedures   Medications Ordered in the ED  ipratropium-albuterol  (DUONEB) 0.5-2.5 (3) MG/3ML nebulizer solution 3 mL (has no administration in time range)  methylPREDNISolone  sodium succinate (SOLU-MEDROL ) 125 mg/2 mL injection 125 mg (has no administration in time range)      {Click here for ABCD2, HEART and other calculators REFRESH Note before signing:1}                               63 year old male with a history of type 2 diabetes, hypertension, hyperlipidemia, COPD/asthma who  presents with concern for shortness of breath and chest tightness.  {Document critical care time when appropriate  Document review of labs and clinical decision tools ie CHADS2VASC2, etc  Document your independent review of radiology images and any outside records  Document your discussion with family members, caretakers and with consultants  Document social determinants of health affecting pt's care  Document your decision making why or why not admission, treatments were needed:32947:::1}   Final diagnoses:  None    ED Discharge Orders     None

## 2024-04-04 NOTE — ED Provider Notes (Signed)
  EMERGENCY DEPARTMENT AT [facility] Provider Assume Care Note I assumed care of Christian [name] on 04/04/2024 at 4 PM from Dr. Dreama.   Briefly, Petar Junior Borgen is a 63 y.o. male who: PMHx: COPD/asthma overlap, HTN, HLD, T2DM P/w chest pain, shortness of breath Presented additionally with diminished breath sounds, and increased inhaler use at home Patient initially was administered DuoNeb and Solu-Medrol , without significant change, therefore PE was considered, and CT PE study obtained, and did not reveal PE Feels similar to worsened on reevaluation by prior team   Plan at the time of handoff: Reeval after additional albuterol  and ipratropium nebs   Please refer to the original provider's note for additional information regarding the care of [facility] [employer].  Reassessment: I personally reassessed the patient: Patient complained of ongoing mild shortness of breath.   Vital Signs:  ED Triage Vitals  Encounter Vitals Group     BP 04/04/24 1055 (!) 181/110     Girls Systolic BP Percentile --      Girls Diastolic BP Percentile --      Boys Systolic BP Percentile --      Boys Diastolic BP Percentile --      Pulse Rate 04/04/24 1055 88     Resp 04/04/24 1055 17     Temp 04/04/24 1055 98.1 F (36.7 C)     Temp Source 04/04/24 1429 Oral     SpO2 04/04/24 1055 94 %     Weight 04/04/24 1128 192 lb (87.1 kg)     Height 04/04/24 1128 5' 10 (1.778 m)     Head Circumference --      Peak Flow --      Pain Score 04/04/24 1128 8     Pain Loc --      Pain Education --      Exclude from Growth Chart --      Hemodynamics:  The patient is hemodynamically stable. Mental Status:  The patient is alert  Additional MDM: Patient reevaluated after additional breathing treatments, reports ongoing shortness of breath that his symptoms did not significantly change with breathing treatments, however patient with good air movement and lungs CTAB bilaterally,  oxygenating 95% on room air.  Discussed with patient that workup and pulmonary exam is reassuring currently, will obtain ambulatory pulse ox, and if reassuring, hopefully will be able to discharge him with refill of albuterol  inhaler.  Patient was able to ambulate without significant difficulty, maintaining saturations in the mid 90s on room air.  No significant wheezing on reevaluation.  Patient continues to have normal work of breathing.  Discussed with patient that workup is reassuring against emergent pathology, VBG does not demonstrate significant COPD exacerbation, saturating well on room air, therefore do feel that patient is stable for discharge and outpatient management, given albuterol  puffs, recommended that he do scheduled albuterol  puffs for the next 48 hours and thereafter as needed and follow-up closely with PCP, and return for any worsening symptoms.  Patient expressed understanding, discharged in stable condition.  Disposition: DISCHARGE: I believe that the patient is safe for discharge home with outpatient follow-up. Patient was informed of all pertinent physical exam, laboratory, and imaging findings. Patient's suspected etiology of their symptom presentation was discussed with the patient and all questions were answered. We discussed following up with PCP. I provided thorough ED return precautions. The patient feels safe and comfortable with this plan.   FREDRIK CANDIE Later, MD Emergency Medicine    Later [place]  S, MD 04/04/24 2327

## 2024-04-04 NOTE — ED Notes (Signed)
 Pt was ambulated by NT, pt stated that he has a right sided headache that feels like an hangover accompanied by some weakness as well O2 stats remained at 95% room air throughout the ambulation.

## 2024-04-05 ENCOUNTER — Telehealth (HOSPITAL_COMMUNITY): Payer: Self-pay | Admitting: Emergency Medicine

## 2024-04-05 ENCOUNTER — Other Ambulatory Visit (HOSPITAL_COMMUNITY): Payer: Self-pay

## 2024-04-05 MED ORDER — PREDNISONE 10 MG PO TABS: 40.0000 mg | ORAL_TABLET | Freq: Every day | ORAL | 0 refills | Status: AC

## 2024-04-05 NOTE — Telephone Encounter (Signed)
 Received notification from [employer] regarding a prior authorization for NUCALA . Authorization has been APPROVED from 03/31/24 to 09/29/24. Approval letter sent to scan center.  Per test claim, copay for 28 days supply is $1274.46  Authorization # [id number] Phone # [phone]  Will proceed with submitting PAP renewal application

## 2024-04-05 NOTE — Telephone Encounter (Signed)
 Sent prednisone  rx to pharmacy

## 2024-04-08 ENCOUNTER — Telehealth: Payer: Self-pay

## 2024-04-08 NOTE — Telephone Encounter (Signed)
 Copied from CRM [id number]. Topic: Clinical - Medical Advice >> Apr 05, 2024 11:41 AM Christian [name] PARAS wrote: Reason for CRM:   Pt wanted to notify Neda he was seen in the ER for 12 hours on 12/8, for exacerbation of asthma and COPD. He was prescribed nebulizer solution and several nasal sprays.   Offered to schedule FU w/Olalere to discuss ER visit but pt refused at this time.   CB#  [phone]

## 2024-04-14 ENCOUNTER — Telehealth: Payer: Self-pay | Admitting: *Deleted

## 2024-04-14 NOTE — Telephone Encounter (Signed)
 Received fax from Nucala  regarding eligibility for re enrollment.  Enrollment form printed off and placed in Dr. Cathye black folder for completion.

## 2024-04-26 NOTE — Telephone Encounter (Signed)
 Completed Nucala  PAP renewal form and faxed with med list, insurance card, pa approval and test claim to [facility] to [facility] . Will update when we receive a response.  Phone #: [phone] Fax #: [phone]

## 2024-05-12 ENCOUNTER — Ambulatory Visit: Payer: Self-pay

## 2024-05-12 ENCOUNTER — Other Ambulatory Visit: Payer: Self-pay | Admitting: Pulmonary Disease

## 2024-05-12 ENCOUNTER — Encounter: Payer: Self-pay | Admitting: Pulmonary Disease

## 2024-05-12 DIAGNOSIS — J4541 Moderate persistent asthma with (acute) exacerbation: Secondary | ICD-10-CM

## 2024-05-12 MED ORDER — NUCALA 100 MG/ML ~~LOC~~ SOAJ: 100.0000 mg | SUBCUTANEOUS | 6 refills | Status: DC

## 2024-05-12 MED ORDER — PREDNISONE 20 MG PO TABS: 20.0000 mg | ORAL_TABLET | Freq: Every day | ORAL | 0 refills | Status: AC

## 2024-05-12 NOTE — Telephone Encounter (Signed)
 FYI Only or Action Required?: Action required by provider: request for appointment.  Patient is followed in Pulmonology for asthma, last seen on 11/12/2023 by Christian Jennet LABOR, MD.  Called Nurse Triage reporting Shortness of Breath.  Symptoms began about [name] month ago.  Interventions attempted: Rescue inhaler, Maintenance inhaler, and Nebulizer treatments.  Symptoms are: gradually worsening.  Triage Disposition: See HCP Within 4 Hours (Or PCP Triage)  Patient/caregiver understands and will follow disposition?: No, wishes to speak with PCP   Copied from CRM #[id number]. Topic: Clinical - Red Word Triage >> May 12, 2024  9:03 AM Christian [name] wrote: Red Word that prompted transfer to Nurse Triage: SOB, breathing problems while doing little errands - pt states he took [name] breathing treatment but as soon as he gets up again he has trouble breathing again. Reason for Disposition  [1] MILD difficulty breathing (e.g., minimal/no SOB at rest, SOB with walking, pulse < 100) AND [2] NEW-onset or WORSE than normal  Answer Assessment - Initial Assessment Questions Worsening shortness of breath with movement. He states when he gets up to walk he gets short winded, then gets home, then gets more winded. He is using inhalers and duonebs multiple times [name] day. He states when he is up doing stuff he feels weak when he gets short winded. He is requesting pulm rehab. He states even when he is sleeping with cpap he gets that hot feeling, then short winded then has to wake up do both inhalers (breztri  and albuterol , nose spray, and [name] full neb treatment). RN attempted to schedule pt today with another provider in the office. He looked at his scheduled and stated he could not do that. When he got back to the phone you could hear the shortness of breath. RN recommended he be evaluated today due to that shortness of breath, using all his medications multiple times [name] day. He stated understanding but asked for an appt for  tomorrow. Forwarding to clinic. Pt would like [name] call back with [name] plan of care.      1. RESPIRATORY STATUS: Describe your breathing? (e.g., wheezing, shortness of breath, unable to speak, severe coughing)      Shortness of breath 2. ONSET: When did this breathing problem begin?      Pt was in er on 04/04/24 3. PATTERN Does the difficult breathing come and go, or has it been constant since it started?      Pretty constant 4. SEVERITY: How bad is your breathing? (e.g., mild, moderate, severe)      Mild to moderate. Denies any shortness of breath at rest, only with movement 5. RECURRENT SYMPTOM: Have you had difficulty breathing before? If Yes, ask: When was the last time? and What happened that time?       6. CARDIAC HISTORY: Do you have any history of heart disease? (e.g., heart attack, angina, bypass surgery, angioplasty)      htn 7. LUNG HISTORY: Do you have any history of lung disease?  (e.g., pulmonary embolus, asthma, emphysema)     asthma 8. CAUSE: What do you think is causing the breathing problem?       9. OTHER SYMPTOMS: Do you have any other symptoms? (e.g., chest pain, cough, dizziness, fever, runny nose)     Says he has coughed only like 4 times 10. O2 SATURATION MONITOR:  Do you use an oxygen  saturation monitor (pulse oximeter) at home? If Yes, ask: What is your reading (oxygen  level) today? What is your usual oxygen   saturation reading? (e.g., 95%)       95% at rest  Protocols used: Breathing Difficulty-[name]-AH

## 2024-05-12 NOTE — Telephone Encounter (Signed)
 Copied from CRM [id number]. Topic: Clinical - Medication Question >> May 11, 2024  1:09 PM Benton KIDD wrote: Reason for CRM: patient is calling because they have to send me a paper to fill out for my medication . patient is requesting for the nurse to call him .  Mepolizumab  (NUCALA ) 100 MG/ML SOAJ [id number]   I called and spoke to pt. Pt states he is having sob and when he exerts himself, mild-moderate. Pt states he will become exhausted afterwards. Pt states he is using his Breztri  as prescribed and is using the albuterol  duoneb nebulizer TID. Pt states it'll help him feel like he can do something but he still become short winded quickly. Pt states this has went on for x3 days. Pt states his o2 level is 95% RA.   Pt complains of chest tightness x 3-4 days and it is a little sore. Pain level at 7/10. Denies fever but states he does feel warm on the forehead. Pt's current temperature is 97.9 (oral).   Pt states his Nucala  expired and needs a new prescription.   Please advise

## 2024-05-16 ENCOUNTER — Telehealth: Payer: Self-pay

## 2024-05-16 NOTE — Telephone Encounter (Signed)
 PAP application pending for Nucala . Faxing a refill in case he can receive a courtesy supply while renewal is pending.   Faxed to [phone].

## 2024-05-27 NOTE — Telephone Encounter (Signed)
 Copied from CRM #[id number]. Topic: Clinical - Medication Question >> May 24, 2024  1:33 PM Dedra B wrote: Reason for CRM: Patient would like a call regarding Mepolizumab  (NUCALA ) 100 MG/ML SOAJ. He still waiting to hear back regarding paperwork for renewal. Please call patient. >> May 25, 2024  1:44 PM Celestine FALCON wrote: Christian [name] called back today asking for an update. I did let him know the encounter from 05/16/2024 with the details being [name], RPH-CPP stated PAP app pending for Nucala , and she faxed a refill in just in case he can receive a courtesy supply while renewal is pending. Faxed to [phone]. Pt understood and will be calling the pharmacy.  Routing to pharmacy to advise.

## 2024-05-31 ENCOUNTER — Other Ambulatory Visit: Payer: Self-pay | Admitting: Pulmonary Disease

## 2024-05-31 MED ORDER — NUCALA 100 MG/ML ~~LOC~~ SOAJ: 100.0000 mg | SUBCUTANEOUS | 6 refills | Status: AC

## 2024-06-02 NOTE — Telephone Encounter (Signed)
 Received fax from [employer] stating pt was reapproved for PAP through 04/27/25. Called pt and advised him of approval and to reach out to [facility] to see if they can refill his rx. Gave him my direct line if he runs into any issues. Approval letter has been sent to media tab for retention. [facility]

## 2024-06-02 NOTE — Telephone Encounter (Signed)
 This has been addressed under medication management encounter on 12/5.
